# Patient Record
Sex: Female | Born: 1950 | Race: White | Hispanic: No | Smoking: Former smoker
Health system: Southern US, Community
[De-identification: ages and names within clinical notes are randomized; demographics above are authoritative.]

## PROBLEM LIST (undated history)

## (undated) DIAGNOSIS — E119 Type 2 diabetes mellitus without complications: Secondary | ICD-10-CM

## (undated) DIAGNOSIS — G4700 Insomnia, unspecified: Secondary | ICD-10-CM

## (undated) DIAGNOSIS — R1115 Cyclical vomiting syndrome unrelated to migraine: Secondary | ICD-10-CM

## (undated) DIAGNOSIS — J189 Pneumonia, unspecified organism: Secondary | ICD-10-CM

## (undated) DIAGNOSIS — M129 Arthropathy, unspecified: Secondary | ICD-10-CM

## (undated) DIAGNOSIS — Z9889 Other specified postprocedural states: Secondary | ICD-10-CM

## (undated) DIAGNOSIS — M899 Disorder of bone, unspecified: Secondary | ICD-10-CM

## (undated) DIAGNOSIS — F419 Anxiety disorder, unspecified: Secondary | ICD-10-CM

## (undated) DIAGNOSIS — R0609 Other forms of dyspnea: Secondary | ICD-10-CM

## (undated) DIAGNOSIS — F329 Major depressive disorder, single episode, unspecified: Secondary | ICD-10-CM

## (undated) DIAGNOSIS — K219 Gastro-esophageal reflux disease without esophagitis: Secondary | ICD-10-CM

## (undated) DIAGNOSIS — IMO0002 Reserved for concepts with insufficient information to code with codable children: Secondary | ICD-10-CM

## (undated) DIAGNOSIS — I10 Essential (primary) hypertension: Secondary | ICD-10-CM

## (undated) DIAGNOSIS — R040 Epistaxis: Secondary | ICD-10-CM

## (undated) DIAGNOSIS — M949 Disorder of cartilage, unspecified: Secondary | ICD-10-CM

## (undated) DIAGNOSIS — IMO0001 Reserved for inherently not codable concepts without codable children: Secondary | ICD-10-CM

## (undated) DIAGNOSIS — R0989 Other specified symptoms and signs involving the circulatory and respiratory systems: Secondary | ICD-10-CM

## (undated) DIAGNOSIS — R7303 Prediabetes: Secondary | ICD-10-CM

## (undated) DIAGNOSIS — Z8669 Personal history of other diseases of the nervous system and sense organs: Secondary | ICD-10-CM

## (undated) DIAGNOSIS — M79609 Pain in unspecified limb: Secondary | ICD-10-CM

## (undated) DIAGNOSIS — Z853 Personal history of malignant neoplasm of breast: Principal | ICD-10-CM

## (undated) DIAGNOSIS — M25559 Pain in unspecified hip: Secondary | ICD-10-CM

## (undated) DIAGNOSIS — E041 Nontoxic single thyroid nodule: Secondary | ICD-10-CM

## (undated) DIAGNOSIS — B1710 Acute hepatitis C without hepatic coma: Secondary | ICD-10-CM

## (undated) DIAGNOSIS — M1990 Unspecified osteoarthritis, unspecified site: Secondary | ICD-10-CM

## (undated) DIAGNOSIS — D649 Anemia, unspecified: Secondary | ICD-10-CM

## (undated) DIAGNOSIS — C801 Malignant (primary) neoplasm, unspecified: Secondary | ICD-10-CM

## (undated) DIAGNOSIS — Z5189 Encounter for other specified aftercare: Secondary | ICD-10-CM

## (undated) HISTORY — PX: ABDOMINAL HYSTERECTOMY: SHX81

## (undated) HISTORY — DX: Cyclical vomiting syndrome unrelated to migraine: R11.15

## (undated) HISTORY — DX: Malignant (primary) neoplasm, unspecified: C80.1

## (undated) HISTORY — PX: TONSILLECTOMY: SUR1361

## (undated) HISTORY — DX: Reserved for inherently not codable concepts without codable children: IMO0001

## (undated) HISTORY — DX: Arthropathy, unspecified: M12.9

## (undated) HISTORY — DX: Disorder of bone, unspecified: M89.9

## (undated) HISTORY — DX: Personal history of malignant neoplasm of breast: Z85.3

## (undated) HISTORY — DX: Acute hepatitis C without hepatic coma: B17.10

## (undated) HISTORY — DX: Unspecified osteoarthritis, unspecified site: M19.90

## (undated) HISTORY — DX: Other specified symptoms and signs involving the circulatory and respiratory systems: R09.89

## (undated) HISTORY — DX: Pain in unspecified hip: M25.559

## (undated) HISTORY — DX: Major depressive disorder, single episode, unspecified: F32.9

## (undated) HISTORY — DX: Insomnia, unspecified: G47.00

## (undated) HISTORY — DX: Other forms of dyspnea: R06.09

## (undated) HISTORY — DX: Epistaxis: R04.0

## (undated) HISTORY — DX: Gastro-esophageal reflux disease without esophagitis: K21.9

## (undated) HISTORY — DX: Pain in unspecified limb: M79.609

## (undated) HISTORY — DX: Essential (primary) hypertension: I10

## (undated) HISTORY — DX: Reserved for concepts with insufficient information to code with codable children: IMO0002

## (undated) HISTORY — PX: OTHER SURGICAL HISTORY: SHX169

## (undated) HISTORY — DX: Encounter for other specified aftercare: Z51.89

## (undated) HISTORY — DX: Type 2 diabetes mellitus without complications: E11.9

## (undated) HISTORY — PX: TUBAL LIGATION: SHX77

## (undated) HISTORY — DX: Disorder of cartilage, unspecified: M94.9

---

## 1998-06-30 HISTORY — PX: BREAST LUMPECTOMY: SHX2

## 2003-11-29 ENCOUNTER — Emergency Department (HOSPITAL_COMMUNITY): Admission: EM | Admit: 2003-11-29 | Discharge: 2003-11-29 | Payer: Self-pay | Admitting: Family Medicine

## 2004-05-27 ENCOUNTER — Ambulatory Visit: Payer: Self-pay | Admitting: Family Medicine

## 2004-05-27 ENCOUNTER — Other Ambulatory Visit: Admission: RE | Admit: 2004-05-27 | Discharge: 2004-05-27 | Payer: Self-pay | Admitting: Family Medicine

## 2004-06-03 ENCOUNTER — Encounter: Admission: RE | Admit: 2004-06-03 | Discharge: 2004-06-03 | Payer: Self-pay | Admitting: Family Medicine

## 2004-06-03 ENCOUNTER — Ambulatory Visit: Payer: Self-pay | Admitting: Family Medicine

## 2004-06-10 ENCOUNTER — Ambulatory Visit: Payer: Self-pay | Admitting: Oncology

## 2004-08-13 ENCOUNTER — Ambulatory Visit: Payer: Self-pay | Admitting: Oncology

## 2004-10-08 ENCOUNTER — Ambulatory Visit: Payer: Self-pay | Admitting: Family Medicine

## 2004-10-10 ENCOUNTER — Ambulatory Visit: Payer: Self-pay | Admitting: Oncology

## 2004-10-30 ENCOUNTER — Ambulatory Visit: Payer: Self-pay | Admitting: Internal Medicine

## 2004-11-07 ENCOUNTER — Ambulatory Visit: Payer: Self-pay | Admitting: Internal Medicine

## 2005-01-10 ENCOUNTER — Ambulatory Visit: Payer: Self-pay | Admitting: Oncology

## 2005-05-21 ENCOUNTER — Ambulatory Visit: Payer: Self-pay | Admitting: Family Medicine

## 2005-05-21 ENCOUNTER — Other Ambulatory Visit: Admission: RE | Admit: 2005-05-21 | Discharge: 2005-05-21 | Payer: Self-pay | Admitting: Family Medicine

## 2005-05-21 ENCOUNTER — Encounter: Payer: Self-pay | Admitting: Family Medicine

## 2005-06-09 ENCOUNTER — Encounter: Admission: RE | Admit: 2005-06-09 | Discharge: 2005-06-09 | Payer: Self-pay | Admitting: Family Medicine

## 2005-06-18 ENCOUNTER — Encounter: Admission: RE | Admit: 2005-06-18 | Discharge: 2005-06-18 | Payer: Self-pay | Admitting: Family Medicine

## 2005-06-26 ENCOUNTER — Encounter (INDEPENDENT_AMBULATORY_CARE_PROVIDER_SITE_OTHER): Payer: Self-pay | Admitting: Diagnostic Radiology

## 2005-06-26 ENCOUNTER — Encounter (INDEPENDENT_AMBULATORY_CARE_PROVIDER_SITE_OTHER): Payer: Self-pay | Admitting: *Deleted

## 2005-06-26 ENCOUNTER — Encounter: Admission: RE | Admit: 2005-06-26 | Discharge: 2005-06-26 | Payer: Self-pay | Admitting: Family Medicine

## 2005-06-30 HISTORY — PX: MASTECTOMY: SHX3

## 2005-07-22 ENCOUNTER — Ambulatory Visit: Payer: Self-pay | Admitting: Oncology

## 2005-07-24 ENCOUNTER — Ambulatory Visit: Payer: Self-pay | Admitting: Family Medicine

## 2005-09-01 ENCOUNTER — Encounter: Admission: RE | Admit: 2005-09-01 | Discharge: 2005-09-01 | Payer: Self-pay | Admitting: Oncology

## 2005-09-02 ENCOUNTER — Ambulatory Visit: Payer: Self-pay | Admitting: Internal Medicine

## 2005-09-03 ENCOUNTER — Encounter: Admission: RE | Admit: 2005-09-03 | Discharge: 2005-09-03 | Payer: Self-pay | Admitting: Surgery

## 2005-09-04 ENCOUNTER — Ambulatory Visit (HOSPITAL_BASED_OUTPATIENT_CLINIC_OR_DEPARTMENT_OTHER): Admission: RE | Admit: 2005-09-04 | Discharge: 2005-09-05 | Payer: Self-pay | Admitting: Surgery

## 2005-09-04 ENCOUNTER — Encounter (INDEPENDENT_AMBULATORY_CARE_PROVIDER_SITE_OTHER): Payer: Self-pay | Admitting: Specialist

## 2005-09-26 ENCOUNTER — Ambulatory Visit: Payer: Self-pay | Admitting: Oncology

## 2005-10-06 ENCOUNTER — Ambulatory Visit: Payer: Self-pay | Admitting: Family Medicine

## 2005-10-16 ENCOUNTER — Ambulatory Visit (HOSPITAL_BASED_OUTPATIENT_CLINIC_OR_DEPARTMENT_OTHER): Admission: RE | Admit: 2005-10-16 | Discharge: 2005-10-16 | Payer: Self-pay | Admitting: Surgery

## 2005-10-16 LAB — HEPATITIS C RNA QUANTITATIVE
HCV Quantitative Log: 6.73 {Log} — ABNORMAL HIGH (ref <1.70)
HCV Quantitative: 5320000 IU/mL — ABNORMAL HIGH (ref <50)

## 2005-10-24 LAB — CBC WITH DIFFERENTIAL/PLATELET
Basophils Absolute: 0.1 10*3/uL (ref 0.0–0.1)
Eosinophils Absolute: 0 10*3/uL (ref 0.0–0.5)
HCT: 41.4 % (ref 34.8–46.6)
HGB: 14.2 g/dL (ref 11.6–15.9)
LYMPH%: 30.2 % (ref 14.0–48.0)
MCV: 83.1 fL (ref 81.0–101.0)
MONO#: 1.3 10*3/uL — ABNORMAL HIGH (ref 0.1–0.9)
MONO%: 10.9 % (ref 0.0–13.0)
NEUT#: 6.8 10*3/uL — ABNORMAL HIGH (ref 1.5–6.5)
NEUT%: 57.6 % (ref 39.6–76.8)
Platelets: 207 10*3/uL (ref 145–400)
RBC: 4.98 10*6/uL (ref 3.70–5.32)

## 2005-10-31 LAB — CBC WITH DIFFERENTIAL/PLATELET
BASO%: 0.6 % (ref 0.0–2.0)
Eosinophils Absolute: 0 10*3/uL (ref 0.0–0.5)
HCT: 38.3 % (ref 34.8–46.6)
LYMPH%: 23.3 % (ref 14.0–48.0)
MCHC: 35.1 g/dL (ref 32.0–36.0)
MONO#: 0.7 10*3/uL (ref 0.1–0.9)
NEUT%: 70.3 % (ref 39.6–76.8)
Platelets: 153 10*3/uL (ref 145–400)
WBC: 12.3 10*3/uL — ABNORMAL HIGH (ref 3.9–10.0)

## 2005-11-04 LAB — CLOSTRIDIUM DIFFICILE EIA

## 2005-11-07 LAB — CBC WITH DIFFERENTIAL/PLATELET
BASO%: 0.8 % (ref 0.0–2.0)
EOS%: 0.2 % (ref 0.0–7.0)
HCT: 39.4 % (ref 34.8–46.6)
MCH: 28.9 pg (ref 26.0–34.0)
MCHC: 34 g/dL (ref 32.0–36.0)
MONO#: 1.3 10*3/uL — ABNORMAL HIGH (ref 0.1–0.9)
RBC: 4.64 10*6/uL (ref 3.70–5.32)
RDW: 14.7 % — ABNORMAL HIGH (ref 11.3–14.5)
WBC: 9 10*3/uL (ref 3.9–10.0)
lymph#: 2.3 10*3/uL (ref 0.9–3.3)

## 2005-11-07 LAB — STOOL CULTURE

## 2005-11-13 ENCOUNTER — Ambulatory Visit: Payer: Self-pay | Admitting: Oncology

## 2005-11-17 LAB — CBC WITH DIFFERENTIAL/PLATELET
BASO%: 0.4 % (ref 0.0–2.0)
Basophils Absolute: 0.1 10*3/uL (ref 0.0–0.1)
EOS%: 0.1 % (ref 0.0–7.0)
HCT: 36.6 % (ref 34.8–46.6)
HGB: 12.6 g/dL (ref 11.6–15.9)
MONO#: 1.2 10*3/uL — ABNORMAL HIGH (ref 0.1–0.9)
NEUT#: 20 10*3/uL — ABNORMAL HIGH (ref 1.5–6.5)
NEUT%: 79.6 % — ABNORMAL HIGH (ref 39.6–76.8)
RDW: 14.6 % — ABNORMAL HIGH (ref 11.3–14.5)
WBC: 25.1 10*3/uL — ABNORMAL HIGH (ref 3.9–10.0)
lymph#: 3.8 10*3/uL — ABNORMAL HIGH (ref 0.9–3.3)

## 2005-11-28 LAB — CBC WITH DIFFERENTIAL/PLATELET
Eosinophils Absolute: 0 10*3/uL (ref 0.0–0.5)
HGB: 12.5 g/dL (ref 11.6–15.9)
MONO#: 1 10*3/uL — ABNORMAL HIGH (ref 0.1–0.9)
NEUT#: 6.2 10*3/uL (ref 1.5–6.5)
RBC: 4.24 10*6/uL (ref 3.70–5.32)
RDW: 16.4 % — ABNORMAL HIGH (ref 11.3–14.5)
WBC: 9.9 10*3/uL (ref 3.9–10.0)
lymph#: 2.6 10*3/uL (ref 0.9–3.3)

## 2005-12-08 LAB — CBC WITH DIFFERENTIAL/PLATELET
Basophils Absolute: 0.2 10*3/uL — ABNORMAL HIGH (ref 0.0–0.1)
Eosinophils Absolute: 0 10*3/uL (ref 0.0–0.5)
HCT: 34.1 % — ABNORMAL LOW (ref 34.8–46.6)
HGB: 11.3 g/dL — ABNORMAL LOW (ref 11.6–15.9)
LYMPH%: 9.7 % — ABNORMAL LOW (ref 14.0–48.0)
MONO#: 1.9 10*3/uL — ABNORMAL HIGH (ref 0.1–0.9)
NEUT#: 37.8 10*3/uL — ABNORMAL HIGH (ref 1.5–6.5)
NEUT%: 85.5 % — ABNORMAL HIGH (ref 39.6–76.8)
Platelets: 241 10*3/uL (ref 145–400)
RBC: 3.84 10*6/uL (ref 3.70–5.32)
WBC: 44.2 10*3/uL — ABNORMAL HIGH (ref 3.9–10.0)

## 2005-12-15 ENCOUNTER — Ambulatory Visit (HOSPITAL_COMMUNITY): Admission: RE | Admit: 2005-12-15 | Discharge: 2005-12-15 | Payer: Self-pay | Admitting: Oncology

## 2005-12-17 ENCOUNTER — Encounter (INDEPENDENT_AMBULATORY_CARE_PROVIDER_SITE_OTHER): Payer: Self-pay | Admitting: *Deleted

## 2005-12-17 ENCOUNTER — Ambulatory Visit (HOSPITAL_BASED_OUTPATIENT_CLINIC_OR_DEPARTMENT_OTHER): Admission: RE | Admit: 2005-12-17 | Discharge: 2005-12-18 | Payer: Self-pay | Admitting: Surgery

## 2005-12-23 ENCOUNTER — Ambulatory Visit: Payer: Self-pay | Admitting: Oncology

## 2006-01-13 ENCOUNTER — Ambulatory Visit: Payer: Self-pay | Admitting: Gastroenterology

## 2006-01-15 ENCOUNTER — Ambulatory Visit: Payer: Self-pay | Admitting: Family Medicine

## 2006-01-15 ENCOUNTER — Ambulatory Visit (HOSPITAL_COMMUNITY): Admission: RE | Admit: 2006-01-15 | Discharge: 2006-01-15 | Payer: Self-pay | Admitting: Oncology

## 2006-01-16 ENCOUNTER — Ambulatory Visit (HOSPITAL_COMMUNITY): Admission: RE | Admit: 2006-01-16 | Discharge: 2006-01-16 | Payer: Self-pay | Admitting: Oncology

## 2006-01-19 LAB — CBC WITH DIFFERENTIAL/PLATELET
BASO%: 0.3 % (ref 0.0–2.0)
EOS%: 0.2 % (ref 0.0–7.0)
HCT: 37 % (ref 34.8–46.6)
MCH: 30.8 pg (ref 26.0–34.0)
MCHC: 33.3 g/dL (ref 32.0–36.0)
NEUT%: 73.3 % (ref 39.6–76.8)
RDW: 15.8 % — ABNORMAL HIGH (ref 11.3–14.5)
lymph#: 1.3 10*3/uL (ref 0.9–3.3)

## 2006-01-19 LAB — COMPREHENSIVE METABOLIC PANEL
ALT: 52 U/L — ABNORMAL HIGH (ref 0–40)
AST: 50 U/L — ABNORMAL HIGH (ref 0–37)
Alkaline Phosphatase: 74 U/L (ref 39–117)
Calcium: 9.2 mg/dL (ref 8.4–10.5)
Chloride: 104 mEq/L (ref 96–112)
Creatinine, Ser: 0.69 mg/dL (ref 0.40–1.20)

## 2006-01-21 ENCOUNTER — Ambulatory Visit: Payer: Self-pay | Admitting: Family Medicine

## 2006-01-30 ENCOUNTER — Ambulatory Visit: Payer: Self-pay | Admitting: Family Medicine

## 2006-02-12 ENCOUNTER — Ambulatory Visit: Payer: Self-pay | Admitting: Gastroenterology

## 2006-02-12 ENCOUNTER — Ambulatory Visit: Payer: Self-pay | Admitting: Oncology

## 2006-03-20 ENCOUNTER — Ambulatory Visit (HOSPITAL_COMMUNITY): Admission: RE | Admit: 2006-03-20 | Discharge: 2006-03-20 | Payer: Self-pay | Admitting: Gastroenterology

## 2006-03-20 ENCOUNTER — Encounter (INDEPENDENT_AMBULATORY_CARE_PROVIDER_SITE_OTHER): Payer: Self-pay | Admitting: Specialist

## 2006-03-25 ENCOUNTER — Ambulatory Visit (HOSPITAL_COMMUNITY): Admission: RE | Admit: 2006-03-25 | Discharge: 2006-03-25 | Payer: Self-pay | Admitting: Interventional Radiology

## 2006-04-09 ENCOUNTER — Ambulatory Visit: Payer: Self-pay | Admitting: Oncology

## 2006-04-13 ENCOUNTER — Ambulatory Visit: Payer: Self-pay | Admitting: Family Medicine

## 2006-04-16 ENCOUNTER — Ambulatory Visit: Payer: Self-pay | Admitting: Gastroenterology

## 2006-04-16 ENCOUNTER — Encounter: Admission: RE | Admit: 2006-04-16 | Discharge: 2006-04-16 | Payer: Self-pay | Admitting: Family Medicine

## 2006-04-17 ENCOUNTER — Ambulatory Visit (HOSPITAL_COMMUNITY): Admission: RE | Admit: 2006-04-17 | Discharge: 2006-04-17 | Payer: Self-pay | Admitting: Oncology

## 2006-04-30 ENCOUNTER — Ambulatory Visit: Payer: Self-pay | Admitting: Family Medicine

## 2006-04-30 LAB — CONVERTED CEMR LAB
Albumin: 3.6 g/dL (ref 3.5–5.2)
BUN: 19 mg/dL (ref 6–23)
CO2: 24 meq/L (ref 19–32)
Chloride: 105 meq/L (ref 96–112)
Creatinine, Ser: 0.7 mg/dL (ref 0.4–1.2)
Eosinophil percent: 1.1 % (ref 0.0–5.0)
GFR calc non Af Amer: 92 mL/min
HCT: 37.9 % (ref 36.0–46.0)
Hemoglobin: 12.5 g/dL (ref 12.0–15.0)
MCHC: 33 g/dL (ref 30.0–36.0)
MCV: 85 fL (ref 78.0–100.0)
Monocytes Absolute: 0.5 10*3/uL (ref 0.2–0.7)
Neutrophils Relative %: 56.2 % (ref 43.0–77.0)
RDW: 13.9 % (ref 11.5–14.6)
Total Bilirubin: 0.5 mg/dL (ref 0.3–1.2)

## 2006-05-08 ENCOUNTER — Ambulatory Visit: Payer: Self-pay | Admitting: Family Medicine

## 2006-05-28 ENCOUNTER — Ambulatory Visit: Payer: Self-pay | Admitting: Family Medicine

## 2006-07-09 ENCOUNTER — Ambulatory Visit: Payer: Self-pay | Admitting: Oncology

## 2006-07-14 LAB — COMPREHENSIVE METABOLIC PANEL
AST: 30 U/L (ref 0–37)
Albumin: 4.3 g/dL (ref 3.5–5.2)
Alkaline Phosphatase: 69 U/L (ref 39–117)
BUN: 13 mg/dL (ref 6–23)
Calcium: 9.6 mg/dL (ref 8.4–10.5)
Creatinine, Ser: 0.74 mg/dL (ref 0.40–1.20)
Glucose, Bld: 106 mg/dL — ABNORMAL HIGH (ref 70–99)
Potassium: 4.6 mEq/L (ref 3.5–5.3)

## 2006-07-14 LAB — CBC WITH DIFFERENTIAL/PLATELET
Basophils Absolute: 0 10*3/uL (ref 0.0–0.1)
EOS%: 0.6 % (ref 0.0–7.0)
Eosinophils Absolute: 0.1 10*3/uL (ref 0.0–0.5)
HCT: 40.2 % (ref 34.8–46.6)
HGB: 13.3 g/dL (ref 11.6–15.9)
MCH: 28.5 pg (ref 26.0–34.0)
MCV: 86.1 fL (ref 81.0–101.0)
MONO%: 6.7 % (ref 0.0–13.0)
NEUT#: 5.1 10*3/uL (ref 1.5–6.5)
NEUT%: 55.9 % (ref 39.6–76.8)
Platelets: 285 10*3/uL (ref 145–400)

## 2006-08-03 DIAGNOSIS — B1710 Acute hepatitis C without hepatic coma: Secondary | ICD-10-CM

## 2006-08-03 DIAGNOSIS — F3289 Other specified depressive episodes: Secondary | ICD-10-CM

## 2006-08-03 DIAGNOSIS — F329 Major depressive disorder, single episode, unspecified: Secondary | ICD-10-CM

## 2006-08-03 DIAGNOSIS — E119 Type 2 diabetes mellitus without complications: Secondary | ICD-10-CM

## 2006-08-03 DIAGNOSIS — Z853 Personal history of malignant neoplasm of breast: Secondary | ICD-10-CM

## 2006-08-03 HISTORY — DX: Personal history of malignant neoplasm of breast: Z85.3

## 2006-08-03 HISTORY — DX: Acute hepatitis C without hepatic coma: B17.10

## 2006-08-03 HISTORY — DX: Type 2 diabetes mellitus without complications: E11.9

## 2006-08-03 HISTORY — DX: Other specified depressive episodes: F32.89

## 2006-08-03 HISTORY — DX: Major depressive disorder, single episode, unspecified: F32.9

## 2006-09-01 ENCOUNTER — Ambulatory Visit: Payer: Self-pay | Admitting: Oncology

## 2006-11-19 ENCOUNTER — Ambulatory Visit: Payer: Self-pay | Admitting: Oncology

## 2006-11-24 LAB — CBC WITH DIFFERENTIAL/PLATELET
BASO%: 0.4 % (ref 0.0–2.0)
Eosinophils Absolute: 0 10*3/uL (ref 0.0–0.5)
MCHC: 34.3 g/dL (ref 32.0–36.0)
MONO#: 0.7 10*3/uL (ref 0.1–0.9)
NEUT#: 6.4 10*3/uL (ref 1.5–6.5)
RBC: 4.76 10*6/uL (ref 3.70–5.32)
RDW: 14.9 % — ABNORMAL HIGH (ref 11.3–14.5)
WBC: 10.4 10*3/uL — ABNORMAL HIGH (ref 3.9–10.0)
lymph#: 3.3 10*3/uL (ref 0.9–3.3)

## 2006-11-24 LAB — COMPREHENSIVE METABOLIC PANEL
ALT: 31 U/L (ref 0–35)
Albumin: 4.4 g/dL (ref 3.5–5.2)
CO2: 21 mEq/L (ref 19–32)
Chloride: 101 mEq/L (ref 96–112)
Glucose, Bld: 95 mg/dL (ref 70–99)
Potassium: 4.6 mEq/L (ref 3.5–5.3)
Sodium: 133 mEq/L — ABNORMAL LOW (ref 135–145)
Total Bilirubin: 0.4 mg/dL (ref 0.3–1.2)
Total Protein: 7.5 g/dL (ref 6.0–8.3)

## 2006-11-24 LAB — CANCER ANTIGEN 27.29: CA 27.29: 14 U/mL (ref 0–39)

## 2006-11-26 ENCOUNTER — Ambulatory Visit (HOSPITAL_COMMUNITY): Admission: RE | Admit: 2006-11-26 | Discharge: 2006-11-26 | Payer: Self-pay | Admitting: Oncology

## 2007-04-30 ENCOUNTER — Ambulatory Visit: Payer: Self-pay | Admitting: Internal Medicine

## 2007-04-30 DIAGNOSIS — M25559 Pain in unspecified hip: Secondary | ICD-10-CM

## 2007-04-30 HISTORY — DX: Pain in unspecified hip: M25.559

## 2007-05-07 ENCOUNTER — Ambulatory Visit: Payer: Self-pay | Admitting: Internal Medicine

## 2007-05-18 ENCOUNTER — Telehealth: Payer: Self-pay | Admitting: Internal Medicine

## 2007-05-21 ENCOUNTER — Ambulatory Visit: Payer: Self-pay | Admitting: Oncology

## 2007-05-25 LAB — AFP TUMOR MARKER: AFP-Tumor Marker: 19.1 ng/mL — ABNORMAL HIGH (ref 0.0–8.0)

## 2007-05-25 LAB — COMPREHENSIVE METABOLIC PANEL
AST: 31 U/L (ref 0–37)
BUN: 21 mg/dL (ref 6–23)
Calcium: 9.2 mg/dL (ref 8.4–10.5)
Chloride: 106 mEq/L (ref 96–112)
Creatinine, Ser: 0.9 mg/dL (ref 0.40–1.20)
Total Bilirubin: 0.5 mg/dL (ref 0.3–1.2)

## 2007-05-31 ENCOUNTER — Ambulatory Visit: Payer: Self-pay | Admitting: Internal Medicine

## 2007-06-01 ENCOUNTER — Encounter: Payer: Self-pay | Admitting: Family Medicine

## 2007-06-01 ENCOUNTER — Telehealth: Payer: Self-pay | Admitting: Internal Medicine

## 2007-06-01 LAB — CBC WITH DIFFERENTIAL/PLATELET
Basophils Absolute: 0 10*3/uL (ref 0.0–0.1)
HCT: 37.1 % (ref 34.8–46.6)
HGB: 12.5 g/dL (ref 11.6–15.9)
LYMPH%: 29.5 % (ref 14.0–48.0)
MONO#: 0.6 10*3/uL (ref 0.1–0.9)
NEUT%: 60.9 % (ref 39.6–76.8)
Platelets: 235 10*3/uL (ref 145–400)
WBC: 8.2 10*3/uL (ref 3.9–10.0)
lymph#: 2.4 10*3/uL (ref 0.9–3.3)

## 2007-06-02 ENCOUNTER — Ambulatory Visit (HOSPITAL_COMMUNITY): Admission: RE | Admit: 2007-06-02 | Discharge: 2007-06-02 | Payer: Self-pay | Admitting: Oncology

## 2007-06-15 ENCOUNTER — Encounter: Payer: Self-pay | Admitting: Internal Medicine

## 2007-07-26 DIAGNOSIS — M949 Disorder of cartilage, unspecified: Secondary | ICD-10-CM

## 2007-07-26 DIAGNOSIS — M899 Disorder of bone, unspecified: Secondary | ICD-10-CM

## 2007-07-26 HISTORY — DX: Disorder of bone, unspecified: M89.9

## 2007-10-06 ENCOUNTER — Telehealth (INDEPENDENT_AMBULATORY_CARE_PROVIDER_SITE_OTHER): Payer: Self-pay | Admitting: *Deleted

## 2007-10-25 ENCOUNTER — Ambulatory Visit: Payer: Self-pay | Admitting: Oncology

## 2007-10-25 ENCOUNTER — Ambulatory Visit: Payer: Self-pay | Admitting: Internal Medicine

## 2007-10-25 DIAGNOSIS — M1611 Unilateral primary osteoarthritis, right hip: Secondary | ICD-10-CM

## 2007-10-25 DIAGNOSIS — M129 Arthropathy, unspecified: Secondary | ICD-10-CM

## 2007-10-25 HISTORY — DX: Arthropathy, unspecified: M12.9

## 2007-10-27 LAB — COMPREHENSIVE METABOLIC PANEL
ALT: 25 U/L (ref 0–35)
Alkaline Phosphatase: 71 U/L (ref 39–117)
Sodium: 140 mEq/L (ref 135–145)
Total Bilirubin: 0.4 mg/dL (ref 0.3–1.2)
Total Protein: 7 g/dL (ref 6.0–8.3)

## 2007-10-27 LAB — CBC WITH DIFFERENTIAL/PLATELET
BASO%: 0.7 % (ref 0.0–2.0)
LYMPH%: 28.4 % (ref 14.0–48.0)
MCHC: 33.5 g/dL (ref 32.0–36.0)
MCV: 78.9 fL — ABNORMAL LOW (ref 81.0–101.0)
MONO%: 5.6 % (ref 0.0–13.0)
Platelets: 238 10*3/uL (ref 145–400)
RBC: 4.62 10*6/uL (ref 3.70–5.32)
WBC: 7.5 10*3/uL (ref 3.9–10.0)

## 2007-10-27 LAB — AFP TUMOR MARKER: AFP-Tumor Marker: 14.3 ng/mL — ABNORMAL HIGH (ref 0.0–8.0)

## 2007-10-27 LAB — LACTATE DEHYDROGENASE: LDH: 146 U/L (ref 94–250)

## 2007-11-02 ENCOUNTER — Encounter: Payer: Self-pay | Admitting: Internal Medicine

## 2007-11-02 ENCOUNTER — Ambulatory Visit: Payer: Self-pay | Admitting: Internal Medicine

## 2008-01-13 ENCOUNTER — Ambulatory Visit: Payer: Self-pay | Admitting: Internal Medicine

## 2008-01-13 DIAGNOSIS — M79609 Pain in unspecified limb: Secondary | ICD-10-CM

## 2008-01-13 DIAGNOSIS — IMO0001 Reserved for inherently not codable concepts without codable children: Secondary | ICD-10-CM

## 2008-01-13 HISTORY — DX: Reserved for inherently not codable concepts without codable children: IMO0001

## 2008-01-13 HISTORY — DX: Pain in unspecified limb: M79.609

## 2008-01-27 ENCOUNTER — Ambulatory Visit: Payer: Self-pay | Admitting: Internal Medicine

## 2008-01-27 DIAGNOSIS — IMO0002 Reserved for concepts with insufficient information to code with codable children: Secondary | ICD-10-CM

## 2008-01-27 DIAGNOSIS — R040 Epistaxis: Secondary | ICD-10-CM

## 2008-01-27 HISTORY — DX: Reserved for concepts with insufficient information to code with codable children: IMO0002

## 2008-01-27 HISTORY — DX: Epistaxis: R04.0

## 2008-01-31 ENCOUNTER — Encounter: Payer: Self-pay | Admitting: Internal Medicine

## 2008-02-21 ENCOUNTER — Encounter: Payer: Self-pay | Admitting: Internal Medicine

## 2008-04-21 ENCOUNTER — Ambulatory Visit: Payer: Self-pay | Admitting: Internal Medicine

## 2008-04-21 LAB — CONVERTED CEMR LAB
CO2: 25 meq/L (ref 19–32)
Chloride: 108 meq/L (ref 96–112)
GFR calc non Af Amer: 68 mL/min
Hgb A1c MFr Bld: 6.1 % — ABNORMAL HIGH (ref 4.6–6.0)
LDL Cholesterol: 80 mg/dL (ref 0–99)
Potassium: 4.6 meq/L (ref 3.5–5.1)
Sodium: 139 meq/L (ref 135–145)
Total CHOL/HDL Ratio: 3.3
Triglycerides: 145 mg/dL (ref 0–149)

## 2008-04-24 ENCOUNTER — Ambulatory Visit: Payer: Self-pay | Admitting: Internal Medicine

## 2008-04-25 ENCOUNTER — Telehealth: Payer: Self-pay | Admitting: Internal Medicine

## 2008-04-25 ENCOUNTER — Telehealth (INDEPENDENT_AMBULATORY_CARE_PROVIDER_SITE_OTHER): Payer: Self-pay | Admitting: *Deleted

## 2008-04-30 DIAGNOSIS — K219 Gastro-esophageal reflux disease without esophagitis: Secondary | ICD-10-CM

## 2008-04-30 DIAGNOSIS — I10 Essential (primary) hypertension: Secondary | ICD-10-CM

## 2008-04-30 HISTORY — DX: Essential (primary) hypertension: I10

## 2008-04-30 HISTORY — DX: Gastro-esophageal reflux disease without esophagitis: K21.9

## 2008-06-08 ENCOUNTER — Ambulatory Visit: Payer: Self-pay | Admitting: Oncology

## 2008-06-12 LAB — COMPREHENSIVE METABOLIC PANEL
AST: 32 U/L (ref 0–37)
Albumin: 4.5 g/dL (ref 3.5–5.2)
Alkaline Phosphatase: 73 U/L (ref 39–117)
Chloride: 101 mEq/L (ref 96–112)
Glucose, Bld: 95 mg/dL (ref 70–99)
Potassium: 4.7 mEq/L (ref 3.5–5.3)
Sodium: 136 mEq/L (ref 135–145)
Total Protein: 7.6 g/dL (ref 6.0–8.3)

## 2008-06-12 LAB — CBC WITH DIFFERENTIAL/PLATELET
Eosinophils Absolute: 0.1 10*3/uL (ref 0.0–0.5)
MCV: 84.2 fL (ref 81.0–101.0)
MONO%: 6 % (ref 0.0–13.0)
NEUT#: 7.5 10*3/uL — ABNORMAL HIGH (ref 1.5–6.5)
RBC: 4.59 10*6/uL (ref 3.70–5.32)
RDW: 14.6 % — ABNORMAL HIGH (ref 11.3–14.5)
WBC: 12 10*3/uL — ABNORMAL HIGH (ref 3.9–10.0)
lymph#: 3.6 10*3/uL — ABNORMAL HIGH (ref 0.9–3.3)

## 2008-06-19 ENCOUNTER — Encounter: Payer: Self-pay | Admitting: Internal Medicine

## 2008-06-19 ENCOUNTER — Ambulatory Visit: Payer: Self-pay | Admitting: Genetic Counselor

## 2008-08-17 ENCOUNTER — Ambulatory Visit: Payer: Self-pay | Admitting: Internal Medicine

## 2008-08-17 DIAGNOSIS — R1115 Cyclical vomiting syndrome unrelated to migraine: Secondary | ICD-10-CM

## 2008-08-17 HISTORY — DX: Cyclical vomiting syndrome unrelated to migraine: R11.15

## 2008-09-29 ENCOUNTER — Ambulatory Visit: Payer: Self-pay | Admitting: Genetic Counselor

## 2008-10-16 ENCOUNTER — Ambulatory Visit: Payer: Self-pay | Admitting: Internal Medicine

## 2008-10-18 ENCOUNTER — Encounter: Payer: Self-pay | Admitting: Internal Medicine

## 2008-10-18 LAB — CONVERTED CEMR LAB
AST: 39 units/L — ABNORMAL HIGH (ref 0–37)
BUN: 14 mg/dL (ref 6–23)
Basophils Absolute: 0.1 10*3/uL (ref 0.0–0.1)
Bilirubin Urine: NEGATIVE
Bilirubin, Direct: 0.1 mg/dL (ref 0.0–0.3)
Cholesterol: 151 mg/dL (ref 0–200)
Creatinine, Ser: 1 mg/dL (ref 0.4–1.2)
Creatinine,U: 40.1 mg/dL
GFR calc non Af Amer: 60.39 mL/min (ref >60)
Glucose, Bld: 99 mg/dL (ref 70–99)
HCT: 36.4 % (ref 36.0–46.0)
HDL: 53.9 mg/dL (ref >39.00)
Hgb A1c MFr Bld: 5.9 % (ref 4.6–6.5)
Ketones, ur: NEGATIVE mg/dL
Leukocytes, UA: NEGATIVE
Lymphs Abs: 2.9 10*3/uL (ref 0.7–4.0)
Microalb Creat Ratio: 5 mg/g (ref 0.0–30.0)
Monocytes Absolute: 0.7 10*3/uL (ref 0.1–1.0)
Monocytes Relative: 7.4 % (ref 3.0–12.0)
Neutrophils Relative %: 61.5 % (ref 43.0–77.0)
Platelets: 227 10*3/uL (ref 150.0–400.0)
Potassium: 4.6 meq/L (ref 3.5–5.1)
RDW: 13.4 % (ref 11.5–14.6)
TSH: 3.26 microintl units/mL (ref 0.35–5.50)
Total Bilirubin: 0.3 mg/dL (ref 0.3–1.2)
Triglycerides: 226 mg/dL — ABNORMAL HIGH (ref 0.0–149.0)
Urobilinogen, UA: 0.2 (ref 0.0–1.0)
VLDL: 45.2 mg/dL — ABNORMAL HIGH (ref 0.0–40.0)
WBC: 9.8 10*3/uL (ref 4.5–10.5)

## 2008-12-07 ENCOUNTER — Ambulatory Visit: Payer: Self-pay | Admitting: Genetic Counselor

## 2009-11-21 ENCOUNTER — Ambulatory Visit: Payer: Self-pay | Admitting: Internal Medicine

## 2009-11-21 DIAGNOSIS — G4700 Insomnia, unspecified: Secondary | ICD-10-CM

## 2009-11-21 DIAGNOSIS — F5104 Psychophysiologic insomnia: Secondary | ICD-10-CM

## 2009-11-21 DIAGNOSIS — R0989 Other specified symptoms and signs involving the circulatory and respiratory systems: Secondary | ICD-10-CM

## 2009-11-21 DIAGNOSIS — R0609 Other forms of dyspnea: Secondary | ICD-10-CM

## 2009-11-21 HISTORY — DX: Insomnia, unspecified: G47.00

## 2009-11-21 HISTORY — DX: Other forms of dyspnea: R06.09

## 2009-11-21 HISTORY — DX: Other specified symptoms and signs involving the circulatory and respiratory systems: R09.89

## 2009-11-22 LAB — CONVERTED CEMR LAB: Vit D, 25-Hydroxy: 39 ng/mL (ref 30–89)

## 2009-12-10 ENCOUNTER — Ambulatory Visit: Payer: Self-pay | Admitting: Internal Medicine

## 2010-07-21 ENCOUNTER — Encounter: Payer: Self-pay | Admitting: Oncology

## 2010-07-21 ENCOUNTER — Encounter: Payer: Self-pay | Admitting: Family Medicine

## 2010-07-28 LAB — CONVERTED CEMR LAB
Albumin: 3.7 g/dL (ref 3.5–5.2)
Alkaline Phosphatase: 76 units/L (ref 39–117)
Alkaline Phosphatase: 80 units/L (ref 39–117)
BUN: 6 mg/dL (ref 6–23)
Basophils Relative: 0.4 % (ref 0.0–3.0)
Bilirubin Urine: NEGATIVE
Bilirubin Urine: NEGATIVE
Bilirubin, Direct: 0.1 mg/dL (ref 0.0–0.3)
Calcium: 9.1 mg/dL (ref 8.4–10.5)
Calcium: 9.3 mg/dL (ref 8.4–10.5)
Cholesterol: 149 mg/dL (ref 0–200)
Creatinine, Ser: 0.7 mg/dL (ref 0.4–1.2)
Creatinine,U: 202.6 mg/dL
Crystals: NEGATIVE
Eosinophils Absolute: 0 10*3/uL (ref 0.0–0.7)
Eosinophils Absolute: 0 10*3/uL (ref 0.0–0.7)
Eosinophils Relative: 0.6 % (ref 0.0–5.0)
GFR calc Af Amer: 111 mL/min
GFR calc non Af Amer: 85.17 mL/min (ref >60)
GFR calc non Af Amer: 92 mL/min
Glucose, Bld: 110 mg/dL — ABNORMAL HIGH (ref 70–99)
HCT: 36.7 % (ref 36.0–46.0)
HDL: 54 mg/dL (ref >39.0)
HDL: 61.3 mg/dL (ref >39.00)
Hemoglobin, Urine: NEGATIVE
Hemoglobin: 11.9 g/dL — ABNORMAL LOW (ref 12.0–15.0)
Hgb A1c MFr Bld: 5.9 % (ref 4.6–6.5)
Hgb A1c MFr Bld: 6.1 % — ABNORMAL HIGH (ref 4.6–6.0)
Ketones, ur: NEGATIVE mg/dL
Ketones, ur: NEGATIVE mg/dL
Leukocytes, UA: NEGATIVE
Lymphocytes Relative: 31.3 % (ref 12.0–46.0)
MCHC: 33.3 g/dL (ref 30.0–36.0)
MCV: 80.2 fL (ref 78.0–100.0)
Microalb Creat Ratio: 0.6 mg/g (ref 0.0–30.0)
Microalb Creat Ratio: 33.9 mg/g — ABNORMAL HIGH (ref 0.0–30.0)
Microalb, Ur: 1.2 mg/dL (ref 0.0–1.9)
Microalb, Ur: 3 mg/dL — ABNORMAL HIGH (ref 0.0–1.9)
Monocytes Absolute: 0.5 10*3/uL (ref 0.1–1.0)
Neutro Abs: 4.6 10*3/uL (ref 1.4–7.7)
Neutrophils Relative %: 60.6 % (ref 43.0–77.0)
Platelets: 251 10*3/uL (ref 150–400)
Potassium: 4.3 meq/L (ref 3.5–5.1)
RBC / HPF: NONE SEEN
RBC: 4.67 M/uL (ref 3.87–5.11)
RDW: 14.5 % (ref 11.5–14.6)
Sodium: 144 meq/L (ref 135–145)
Specific Gravity, Urine: >=1.03 (ref 1.000–1.030)
TSH: 2.11 microintl units/mL (ref 0.35–5.50)
Total CHOL/HDL Ratio: 2
Total Protein, Urine: NEGATIVE mg/dL
Total Protein: 7.1 g/dL (ref 6.0–8.3)
Total Protein: 7.3 g/dL (ref 6.0–8.3)
Triglycerides: 109 mg/dL (ref 0–149)
Urine Glucose: NEGATIVE mg/dL
Urobilinogen, UA: 0.2 (ref 0.0–1.0)
VLDL: 19 mg/dL (ref 0.0–40.0)
VLDL: 22 mg/dL (ref 0–40)
WBC: 8.5 10*3/uL (ref 4.5–10.5)

## 2010-07-30 NOTE — Miscellaneous (Signed)
Summary: Orders Update  Clinical Lists Changes  Orders: Added new Service order of No Show NS50 (NS50) - Signed 

## 2010-07-30 NOTE — Assessment & Plan Note (Signed)
Summary: PER ROBIN SCHED---STC   Vital Signs:  Patient profile:   60 year old female Height:      62 inches Weight:      203 pounds BMI:     37.26 O2 Sat:      94 % on Room air Temp:     98 degrees F oral Pulse rate:   92 / minute BP sitting:   124 / 80  (left arm) Cuff size:   large  Vitals Entered ByZella Ball Ewing (Nov 21, 2009 3:56 PM)  O2 Flow:  Room air  CC: Medication Refills/RE   CC:  Medication Refills/RE.  History of Present Illness: here for refills,  c/o more stress in the past year with divorce and foreclosing on the house; has mild but significant and reproducible DOE with going up stairs in her house , has long hx of smoking and wonders if she has more problem than deconditiong such as COPD;  Pt denies CP,  wheezing, orthopnea, pnd, worsening LE edema, palps, dizziness or syncope  Pt denies new neuro symptoms such as headache, facial or extremity weakness Pt denies polydipsia, polyuria, or low sugar symptoms such as shakiness improved with eating.  Overall good compliance with meds, trying to follow low chol, DM diet, wt stable, little excercise however  Savella helps but is expensive at the 50 two times a day dosing.    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    Problems Prior to Update: 1)  Dyspnea On Exertion  (ICD-786.09) 2)  Persistent Vomiting  (ICD-536.2) 3)  Gerd  (ICD-530.81) 4)  Hypertension  (ICD-401.9) 5)  Epistaxis, Recurrent  (ICD-784.7) 6)  Knee Sprain, Acute  (ICD-844.9) 7)  Fibromyalgia  (ICD-729.1) 8)  Leg Pain, Left  (ICD-729.5) 9)  Preventive Health Care  (ICD-V70.0) 10)  Arthritis  (ICD-716.90) 11)  Osteopenia  (ICD-733.90) 12)  Hip Pain, Right  (ICD-719.45) 13)  Hepatitis C  (ICD-070.51) 14)  Diabetes Mellitus, Type II  (ICD-250.00) 15)  Depression  (ICD-311) 16)  Breast Cancer, Hx of  (ICD-V10.3)  Medications Prior to Update: 1)   Budeprion Xl 300 Mg  Tb24 (Bupropion Hcl) .Marland Kitchen.. 1 in The Am 2)  Fluoxetine Hcl 20 Mg  Caps (Fluoxetine Hcl) .... 3 Cap Daily 3)  Cholestyramine Light 4 Gm/dose  Powd (Cholestyramine Light) .... 2 Scoopful Once Daily 4)  Fosamax 70 Mg Tabs (Alendronate Sodium) .... Take One Tablet Weekly 5)  Femara 2.5 Mg  Tabs (Letrozole) .Marland Kitchen.. 1 By Mouth Qd 6)  Nexium 40 Mg Cpdr (Esomeprazole Magnesium) .Marland Kitchen.. 1po Once Daily 7)  Vicodin 5-500 Mg  Tabs (Hydrocodone-Acetaminophen) .Marland Kitchen.. 1 or By Mouth Qid As Needed Pain 8)  Flexeril 10 Mg  Tabs (Cyclobenzaprine Hcl) .Marland Kitchen.. 1 By Mouth Two Times A Day As Needed For Pain 9)  Onetouch Ultra Test   Strp (Glucose Blood) .... Use Asd Bid 10)  Lancets   Misc (Lancets) .... Use Asd Bid 11)  Diclofenac Sodium 75 Mg  Tbec (Diclofenac Sodium) .Marland Kitchen.. 1 By Mouth Two Times A Day As Needed 12)  Savella 50 Mg  Tabs (Milnacipran Hcl) .Marland Kitchen.. 1 By Mouth Two Times A Day 13)  Lisinopril 5 Mg Tabs (Lisinopril) .Marland Kitchen.. 1 By Mouth Once Daily 14)  Xyzal 5 Mg Tabs (Levocetirizine Dihydrochloride) .Marland Kitchen.. 1po Once Daily As Needed  Current Medications (verified): 1)  Budeprion Xl 300 Mg  Tb24 (Bupropion Hcl) .Marland Kitchen.. 1 in The Am 2)  Fluoxetine Hcl 20 Mg  Caps (Fluoxetine Hcl) .... 3 Cap Daily 3)  Cholestyramine Light 4 Gm/dose  Powd (Cholestyramine Light) .... 2 Scoopful Once Daily 4)  Alendronate Sodium 10 Mg Tabs (Alendronate Sodium) .Marland Kitchen.. 1po Once Daily 5)  Omeprazole 20 Mg Cpdr (Omeprazole) .... 2 By Mouth Once Daily 6)  Vicodin 5-500 Mg  Tabs (Hydrocodone-Acetaminophen) .Marland Kitchen.. 1 or By Mouth Qid As Needed Recurrent Back Pain 7)  Flexeril 10 Mg  Tabs (Cyclobenzaprine Hcl) .Marland Kitchen.. 1 By Mouth Two Times A Day As Needed For Pain - Generic 8)  Onetouch Ultra Test   Strp (Glucose Blood) .... Use Asd 1 Once Daily 9)  Lancets   Misc (Lancets) .... Use Asd Once Daily 10)  Diclofenac Sodium 75 Mg  Tbec (Diclofenac Sodium) .Marland Kitchen.. 1 By Mouth Two Times A Day As Needed 11)  Savella 100 Mg Tabs (Milnacipran Hcl) .... 1/2 Po Two  Times A Day 12)  Losartan Potassium 25 Mg Tabs (Losartan Potassium) .Marland Kitchen.. 1po Once Daily 13)  Aspir-Low 81 Mg Tbec (Aspirin) .Marland Kitchen.. 1 By Mouth Once Daily 14)  Trazodone Hcl 50 Mg Tabs (Trazodone Hcl) .Marland Kitchen.. 1po At Bedtime As Needed  Allergies (verified): 1)  * Luvox 2)  * Effexor  Past History:  Past Medical History: Last updated: 04/24/2008 Breast cancer, hx of, 2000, stage 2b Depression, chronic persistent Diabetes mellitus, type II Hepatitis C - decined treatment Breast cancer, hx of (2007) - recurrence right hip DJD recurrent LBP Hypertension GERD  Past Surgical History: Last updated: 10/25/2007 Lumpectomy (2000) R with axillary exploration B mastectomy (2007) Tonsillectomy s/p right broken leg with surgury as teen Tubal ligation s/p thyroid FNA - neg  Family History: Last updated: 04/24/2008 heart disease - mother No cancer sister with FMS, [place], breast cancer  Social History: Last updated: 11/21/2009 no kids work - med Programmer, multimedia -  Former Smoker - smoked 2-4 ppd but quit 2007 Alcohol use-yes - rare Divorced Drug use-no  Risk Factors: Smoking Status: quit (10/25/2007)  Social History: Reviewed history from 10/25/2007 and no changes required. no kids work - med Programmer, multimedia -  Former Smoker - smoked 2-4 ppd but quit 2007 Alcohol use-yes - rare Divorced Drug use-no Drug Use:  no  Review of Systems  The patient denies anorexia, fever, vision loss, decreased hearing, hoarseness, chest pain, syncope, dyspnea on exertion, peripheral edema, prolonged cough, headaches, hemoptysis, abdominal pain, melena, hematochezia, severe indigestion/heartburn, hematuria, muscle weakness, suspicious skin lesions, difficulty walking, depression, unusual weight change, abnormal bleeding, enlarged lymph nodes, angioedema, and breast masses.         all otherwise negative per pt -    Physical Exam  General:  alert and overweight-appearing.   Head:  normocephalic and  atraumatic.   Eyes:  vision grossly intact, pupils equal, and pupils round.   Ears:  R ear normal and L ear normal.   Nose:  no external deformity and no nasal discharge.   Mouth:  no gingival abnormalities and pharynx pink and moist.   Neck:  supple and no masses.   Lungs:  normal respiratory effort and normal breath sounds.   Heart:  normal rate and regular rhythm.   Abdomen:  soft, non-tender, and normal bowel sounds.   Msk:  no joint tenderness and no joint swelling.   Extremities:  no edema, no erythema  Neurologic:  cranial nerves II-XII intact and strength normal in all extremities.   Skin:  color normal  and no rashes.   Psych:  normally interactive and not anxious appearing.     Impression & Recommendations:  Problem # 1:  Preventive Health Care (ICD-V70.0)  Overall doing well, age appropriate education and counseling updated and referral for appropriate preventive services done unless declined, immunizations up to date or declined, diet counseling done if overweight, urged to quit smoking if smokes , most recent labs reviewed and current ordered if appropriate, ecg reviewed or declined (interpretation per ECG scanned in the EMR if done); information regarding Medicare Prevention requirements given if appropriate; speciality referrals updated as appropriate   Orders: EKG w/ Interpretation (93000) T-Vitamin D (25-Hydroxy) (19147-82956) TLB-BMP (Basic Metabolic Panel-BMET) (80048-METABOL) TLB-CBC Platelet - w/Differential (85025-CBCD) TLB-Hepatic/Liver Function Pnl (80076-HEPATIC) TLB-Lipid Panel (80061-LIPID) TLB-TSH (Thyroid Stimulating Hormone) (84443-TSH) TLB-Udip ONLY (81003-UDIP)  Problem # 2:  DIABETES MELLITUS, TYPE II (ICD-250.00)  Her updated medication list for this problem includes:    Losartan Potassium 25 Mg Tabs (Losartan potassium) .Marland Kitchen... 1po once daily    Aspir-low 81 Mg Tbec (Aspirin) .Marland Kitchen... 1 by mouth once daily cont diet - for a1c; Pt to cont DM diet,  excercise, wt loss efforts; to check labs today., stable overall by hx and exam, ok to continue meds/tx as is   Orders: TLB-A1C / Hgb A1C (Glycohemoglobin) (83036-A1C) TLB-Microalbumin/Creat Ratio, Urine (82043-MALB)  Problem # 3:  DYSPNEA ON EXERTION (ICD-786.09) to check cxr today, also PFT's with signifcant hx of smoking  Orders: T-2 View CXR, Same Day (71020.5TC) Misc. Referral (Misc. Ref)  Problem # 4:  INSOMNIA-SLEEP DISORDER-UNSPEC (ICD-780.52) ok for trazodone at bedtime as needed   Complete Medication List: 1)  Budeprion Xl 300 Mg Tb24 (Bupropion hcl) .Marland Kitchen.. 1 in the am 2)  Fluoxetine Hcl 20 Mg Caps (Fluoxetine hcl) .... 3 cap daily 3)  Cholestyramine Light 4 Gm/dose Powd (Cholestyramine light) .... 2 scoopful once daily 4)  Alendronate Sodium 10 Mg Tabs (Alendronate sodium) .Marland Kitchen.. 1po once daily 5)  Omeprazole 20 Mg Cpdr (Omeprazole) .... 2 by mouth once daily 6)  Vicodin 5-500 Mg Tabs (Hydrocodone-acetaminophen) .Marland Kitchen.. 1 or by mouth qid as needed recurrent back pain 7)  Flexeril 10 Mg Tabs (Cyclobenzaprine hcl) .Marland Kitchen.. 1 by mouth two times a day as needed for pain - generic 8)  Onetouch Ultra Test Strp (Glucose blood) .... Use asd 1 once daily 9)  Lancets Misc (Lancets) .... Use asd once daily 10)  Diclofenac Sodium 75 Mg Tbec (Diclofenac sodium) .Marland Kitchen.. 1 by mouth two times a day as needed 11)  Savella 100 Mg Tabs (Milnacipran hcl) .... 1/2 po two times a day 12)  Losartan Potassium 25 Mg Tabs (Losartan potassium) .Marland Kitchen.. 1po once daily 13)  Aspir-low 81 Mg Tbec (Aspirin) .Marland Kitchen.. 1 by mouth once daily 14)  Trazodone Hcl 50 Mg Tabs (Trazodone hcl) .Marland Kitchen.. 1po at bedtime as needed  Patient Instructions: 1)  Take an Aspirin every day - 81 mg - 1 per day - COATED only 2)  stop the nexium  3)  start the omeprazole 20 mg - 2 per day 4)  remember take only HALF of savella prescription 5)  start the trazodone 50 mg for sleep as needed 6)  Please go to the Lab in the basement for your blood and/or  urine tests today 7)  Your EKG was good today 8)  Please go to Radiology in the basement level for your X-Ray today  9)  You will be contacted about the referral(s) to: Lung testing (PFT's) 10)  please remember  your yearly pap smear and mammogram will need to be scheduled 11)  Please schedule a follow-up appointment in 1 year or sooner if needed Prescriptions: TRAZODONE HCL 50 MG TABS (TRAZODONE HCL) 1po at bedtime as needed  #90 x 3   Entered and Authorized by:   Corwin Levins MD   Signed by:   Corwin Levins MD on 11/21/2009   Method used:   Print then Give to Patient   RxID:   [id number] TRAZODONE HCL 50 MG TABS (TRAZODONE HCL) 1po at bedtime as needed  #30 x 11   Entered and Authorized by:   Corwin Levins MD   Signed by:   Corwin Levins MD on 11/21/2009   Method used:   Print then Give to Patient   RxID:   [id number] NUUVOZDG POTASSIUM 25 MG TABS (LOSARTAN POTASSIUM) 1po once daily  #90 x 3   Entered and Authorized by:   Corwin Levins MD   Signed by:   Corwin Levins MD on 11/21/2009   Method used:   Print then Give to Patient   RxID:   [id number] FIEPPIRJ POTASSIUM 25 MG TABS (LOSARTAN POTASSIUM) 1po once daily  #30 x 11   Entered and Authorized by:   Corwin Levins MD   Signed by:   Corwin Levins MD on 11/21/2009   Method used:   Print then Give to Patient   RxID:   [id number] DICLOFENAC SODIUM 75 MG  TBEC (DICLOFENAC SODIUM) 1 by mouth two times a day as needed  #180 x 3   Entered and Authorized by:   Corwin Levins MD   Signed by:   Corwin Levins MD on 11/21/2009   Method used:   Print then Give to Patient   RxID:   [id number] DICLOFENAC SODIUM 75 MG  TBEC (DICLOFENAC SODIUM) 1 by mouth two times a day as needed  #60 x 11   Entered and Authorized by:   Corwin Levins MD   Signed by:   Corwin Levins MD on 11/21/2009   Method used:   Print then Give to Patient   RxID:   [id number] LANCETS   MISC (LANCETS) use asd once daily  #100 x 11   Entered and  Authorized by:   Corwin Levins MD   Signed by:   Corwin Levins MD on 11/21/2009   Method used:   Print then Give to Patient   RxID:   [id number] ONETOUCH ULTRA TEST   STRP (GLUCOSE BLOOD) use asd 1 once daily  #100 x 11   Entered and Authorized by:   Corwin Levins MD   Signed by:   Corwin Levins MD on 11/21/2009   Method used:   Print then Give to Patient   RxID:   [id number] FLEXERIL 10 MG  TABS (CYCLOBENZAPRINE HCL) 1 by mouth two times a day as needed for pain - generic  #90 x 1   Entered and Authorized by:   Corwin Levins MD   Signed by:   Corwin Levins MD on 11/21/2009   Method used:   Print then Give to Patient   RxID:   [id number] VICODIN 5-500 MG  TABS (HYDROCODONE-ACETAMINOPHEN) 1 or by mouth qid as needed recurrent back pain  #60 x 1   Entered and Authorized by:   Corwin Levins MD   Signed by:   Corwin Levins MD on 11/21/2009   Method used:  Print then Give to Patient   RxID:   [id number] OMEPRAZOLE 20 MG CPDR (OMEPRAZOLE) 2 by mouth once daily  #180 x 3   Entered and Authorized by:   Corwin Levins MD   Signed by:   Corwin Levins MD on 11/21/2009   Method used:   Print then Give to Patient   RxID:   [id number] OMEPRAZOLE 20 MG CPDR (OMEPRAZOLE) 2 by mouth once daily  #60 x 11   Entered and Authorized by:   Corwin Levins MD   Signed by:   Corwin Levins MD on 11/21/2009   Method used:   Print then Give to Patient   RxID:   [id number] ALENDRONATE SODIUM 10 MG TABS (ALENDRONATE SODIUM) 1po once daily  #90 x 3   Entered and Authorized by:   Corwin Levins MD   Signed by:   Corwin Levins MD on 11/21/2009   Method used:   Print then Give to Patient   RxID:   [id number] ALENDRONATE SODIUM 10 MG TABS (ALENDRONATE SODIUM) 1po once daily  #30 x 11   Entered and Authorized by:   Corwin Levins MD   Signed by:   Corwin Levins MD on 11/21/2009   Method used:   Print then Give to Patient   RxID:   [id number] FLUOXETINE HCL 20 MG  CAPS (FLUOXETINE HCL)  3 cap daily  #270 x 3   Entered and Authorized by:   Corwin Levins MD   Signed by:   Corwin Levins MD on 11/21/2009   Method used:   Print then Give to Patient   RxID:   [id number] FLUOXETINE HCL 20 MG  CAPS (FLUOXETINE HCL) 3 cap daily  #90 x 11   Entered and Authorized by:   Corwin Levins MD   Signed by:   Corwin Levins MD on 11/21/2009   Method used:   Print then Give to Patient   RxID:   [id number] BUDEPRION XL 300 MG  TB24 (BUPROPION HCL) 1 in the am  #90 x 3   Entered and Authorized by:   Corwin Levins MD   Signed by:   Corwin Levins MD on 11/21/2009   Method used:   Print then Give to Patient   RxID:   [id number] BUDEPRION XL 300 MG  TB24 (BUPROPION HCL) 1 in the am  #30 x 11   Entered and Authorized by:   Corwin Levins MD   Signed by:   Corwin Levins MD on 11/21/2009   Method used:   Print then Give to Patient   RxID:   [id number] SAVELLA 100 MG TABS (MILNACIPRAN HCL) 1po two times a day  #180 x 3   Entered and Authorized by:   Corwin Levins MD   Signed by:   Corwin Levins MD on 11/21/2009   Method used:   Print then Give to Patient   RxID:   [id number] SAVELLA 100 MG TABS (MILNACIPRAN HCL) 1po two times a day  #60 x 11   Entered and Authorized by:   Corwin Levins MD   Signed by:   Corwin Levins MD on 11/21/2009   Method used:   Print then Give to Patient   RxID:   [id number]

## 2010-11-15 NOTE — Op Note (Signed)
NAMEJULIENNE, VOGLER NO.:  [id number]   MEDICAL RECORD NO.:  [id number]          PATIENT TYPE:  AMB   LOCATION:  DSC                          FACILITY:  [facility]   PHYSICIAN:  Currie Paris, M.D.DATE OF BIRTH:  Jul 24, 1950   DATE OF PROCEDURE:  12/17/2005  DATE OF DISCHARGE:                                 OPERATIVE REPORT   OFFICE MEDICAL RECORD NUMBER:  [id number].   PREOPERATIVE DIAGNOSIS:  1.  History of right breast cancer.  2.  Severe left mastitis possibly chemical induced.  3.  Unneeded Port-A-Cath.   CLINICAL HISTORY:  Ms. Vigilante is a 60 year old lady who has undergone a  right mastectomy for a second right breast cancer.  She has finished her  chemo.  She and Dr. Darnelle Catalan, her oncologist, had elected to proceed to a  prophylactic left mastectomy.  In the meantime, she has developed a severe  left mastitis which did not respond to antibiotics and was temporally  related somewhat to an administration dose of chemo.  It is unclear whether  this was related to that or some infectious process but it was elected to  proceed at this point to surgery.  In addition, her Port-A-Cath was  determined to be no longer needed and we would remove that at the same time.   DESCRIPTION OF PROCEDURE:  The patient was seen in the holding area and she  had no further questions.  We confirmed left mastectomy and Port-A-Cath  removal as the planned procedure.   The patient was taken into the operating room and after satisfactory general  anesthesia had been obtained, the left breast was prepped and draped as a  sterile field.  The time-out occurred.   I made a wide elliptical incision to try to excise most of the erythematous  skin.  The patient has fairly large breasts and I thought we would still  have plenty of skin to close with.   The skin was particularly edematous as was the subcutaneous tissues up  towards the Port-A-Cath site.  I raised a skin flap  superiorly and once I  entered the area where the reservoir was, I was able to back the Port-A-Cath  out and put a suture on the tract.   I continued to make the flap going medially to the sternum and superiorly to  the clavicle and then laterally out into the axilla.  This was fairly bloody  as the tissues as noted were edematous and hypervascular apparently from the  inflammatory process.  No frank pus was identified.   The inferior flap was made in a similar fashion going down to just below the  inframammary fold and then taking it out laterally to the latissimus.  The  breast was then removed from medial to lateral.  As we were disconnecting it  from the clavipectoral fascia, there were multiple soft enlarged nodes which  were enlarged secondary to the inflammatory process but I took at least one  of the nodes for pathological exam and this went with the specimen.   Once everything was off,  I spent several minutes making sure everything was  dry.  I irrigated and checked again for hemostasis.  I put two 19 Blake  drains and secured them with 2-0 nylons.   Laterally there was a lot of excess skin and I thought this would produce a  dog ear.   I then excised some of the skin without really taking the  incision particularly laterally, but just took some of this axillary skin to  see if we could not get a smoother axillary area.   Again we irrigated to make sure everything was dry.  I tacked the flaps down  with some 3-0 Vicryl to see if this would diminish the postoperative seroma  formation.  The skin was then closed with staples.  I left 20 mL of 0.25%  plain Marcaine under the flaps for approximately 10 minutes before charging  the drains.   The patient tolerated the procedure well.  There no operative complications.  All counts were correct.  Estimated blood loss was 250 mL.      Currie Paris, M.D.  Electronically Signed     CJS/MEDQ  D:  12/17/2005  T:   12/17/2005  Job:  [id number]   cc:   Loreen Freud, M.D.  [facility] [place]  [place] [address]   Valentino Hue. Magrinat, M.D.  Fax: [phone]

## 2010-11-15 NOTE — Op Note (Signed)
[name], [name] NO.:  [id number]   MEDICAL RECORD NO.:  [id number]          PATIENT TYPE:  AMB   LOCATION:  DSC                          FACILITY:  [facility]   PHYSICIAN:  Currie Paris, M.D.DATE OF BIRTH:  1950/11/10   DATE OF PROCEDURE:  09/04/2005  DATE OF DISCHARGE:                                 OPERATIVE REPORT   PREOPERATIVE DIAGNOSIS:  Cancer right breast upper outer quadrant.   POSTOPERATIVE DIAGNOSIS:  Cancer right breast upper outer quadrant.   OPERATION:  Right total mastectomy.   SURGEON:  Currie Paris, M.D.   ASSISTANT:  Cherylynn Ridges, M.D.   ANESTHESIA:  General.   CLINICAL HISTORY:  This is a 60 year old lady who is status post right  lumpectomy, radiation and I believe chemotherapy for an upper inner quadrant  breast cancer.  She has now developed a new cancer in the same breast but  fairly far lateral and in the upper outer quadrant. After consultation with  Dr. Darnelle Catalan and full consideration of alternatives, she elected to proceed  to right total mastectomy.  Since she has already had a full node  dissection, no nodal evaluation was contemplated. She is considering whether  to have a delayed reconstruction.   DESCRIPTION OF PROCEDURE:  The patient was seen in the holding area and she  had no further questions.  We both identified and marked the right breast as  the operative side.   The patient was taken to the operating room and after satisfactory general  anesthesia had been obtained, the right breast was prepped and draped.  The  time-out occurred.  I outlined an elliptical incision to take a fairly  generous amount of skin because this was previously radiated skin incision  since she was not having immediate reconstruction, I did not want to have  any excess skin that might not lie down flat over the flaps. The superior  flap was made at going fairly thin and going to the sternum, then to the  clavicle, and out  into the axilla, elevating the old scar. The inferior  incision was made and that flap then created in a similar fashion going to  the inframammary fold and laterally to latissimus.  Once this was done, the  breast was removed from medial to lateral taking the fascia. Some clips  marking the prior lumpectomy site were noted. This was all done with  cautery. Once this was done, I spent few minutes making sure everything was  dry, irrigating and cauterizing.  I took excess fat in a couple places for  flaps, specifically the superior flap looked a little bit thick. Once  everything appeared to be dry, I carefully palpated the axilla and there was  no evidence of any metastatic disease there. Since she had already had a  formal dissection, no further entry into the axilla was made.   Two 19 Blake drains were placed through the inferior flap, one across the  superior flap and one into the axillary area. I did a final irrigation and a  final check for  hemostasis and again everything appeared to be dry.  The  skin was  closed with staples. The drains were secured with 2-0 nylons. I left  approximately 30 mL of 0.25% plain Marcaine under the flaps for  approximately 10 minutes before charging the drains.  The patient tolerated  procedure well. No complications. All counts were correct. Estimated blood  loss was less 100 mL.      Currie Paris, M.D.  Electronically Signed     CJS/MEDQ  D:  09/04/2005  T:  09/04/2005  Job:  [id number]   cc:   Loreen Freud, M.D.  [facility] [address] [place]  [place] [address]   Valentino Hue. Magrinat, M.D.  Fax: [phone]

## 2010-11-15 NOTE — Op Note (Signed)
NAMECECI, TALIAFERRO NO.:  [id number]   MEDICAL RECORD NO.:  [id number]          PATIENT TYPE:  AMB   LOCATION:  DSC                          FACILITY:  [facility]   PHYSICIAN:  Currie Paris, M.D.DATE OF BIRTH:  [birth year]   DATE OF PROCEDURE:  10/16/2005  DATE OF DISCHARGE:                                 OPERATIVE REPORT   OFFICE MEDICAL RECORD NUMBER:  [id number]   PREOPERATIVE DIAGNOSES:  1.  Cancer of left breast, status post mastectomy.  2.  Inadequate venous access for chemotherapy.  3.  Post-mastectomy seroma.   POSTOPERATIVE DIAGNOSES:  1.  Cancer of left breast, status post mastectomy.  2.  Inadequate venous access for chemotherapy.  3.  Post-mastectomy seroma.   OPERATION:  1.  Aspiration of post-mastectomy seroma.  2.  Port-A-Cath placement.   SURGEON:  Currie Paris, M.D.   ANESTHESIA:  MAC.   CLINICAL HISTORY:  This is a 60 year old lady who has developed a recurrent  right breast cancer and recently had a mastectomy.  She has been by the  office a couple of times for seroma aspirations of what is now just a very  small residual seroma.  We elected to aspirate that at the time of Port-A-  Cath placement to save her 1 trip to the office.  She also was getting ready  to have chemotherapy beginning tomorrow and needed IV access.   DESCRIPTION OF PROCEDURE:  The patient was seen in the holding area and she  had no further questions.  She has already had 1 prior Port-A-Cath, so she  is familiar with the procedure and we discussed trying to place it using the  same scar from the prior Port-A-Cath.  The patient was then taken to the  operating room and given IV sedation.  A timeout occurred.  I aspirated 20  mL from an axillary area on the right to complete resolve the seroma.  The  left breast and upper chest/lower neck area were prepped and draped into a  sterile field.   The patient was placed in Trendelenburg.  The area under the  left clavicle  was infiltrated with 1% Xylocaine.  The left subclavian vein was entered on  the initial attempt and the guidewire was threaded easily into the inferior  vena cava.  Additional local was infiltrated in the chest wall area and the  old scar from the Port-A-Cath excised.  A subcutaneous pocket was fashioned  with cautery.  A tunnel was made from here to the guidewire site using  additional local for the tunnel.   The Port-A-Cath tubing was pulled through the subcutaneous tunnel and  flushed.  The guidewire tract was dilated once with the dilator peel-away  sheath.  The dilator and guidewire were removed and the catheter threaded  easily to approximately 24 cm.  It aspirated and irrigated easily.   On fluoroscopy, I saw that it had actually made a U turn in the superior  vena cava.  Using fluoroscopy, I backed this up until the catheter returned  straight and advanced it into  the right atrium.  It aspirated and irrigated  easily.  I then backed it out so that it was in the superior vena cava near  the junction of the right atrium and 18 cm from the skin.   It aspirated and flushed easily.  The reservoir was flushed and attachment  and locking mechanism engaged.  That aspirated and irrigated easily.   That was secured to the deep tissues with some 3-0 Vicryl.  The subcu was  closed with some 3-0 Vicryl.  The catheter reservoir was accessed and  flushed with dilute heparin.  Final fluoroscopy showed good positioning in  the sphinx.   The skin was closed with 4-0 Monocryl subcuticulars and some Steri-Strips.  The catheter was flushed a final time with concentrated heparin and capped  and left to access.   The patient tolerated the procedure well.  There were no operative  complications.  All counts were correct.      Currie Paris, M.D.  Electronically Signed     CJS/MEDQ  D:  10/16/2005  T:  10/17/2005  Job:  [id number]

## 2010-11-29 ENCOUNTER — Other Ambulatory Visit: Payer: Self-pay | Admitting: Internal Medicine

## 2010-12-26 ENCOUNTER — Other Ambulatory Visit: Payer: Self-pay | Admitting: Internal Medicine

## 2010-12-27 ENCOUNTER — Other Ambulatory Visit: Payer: Self-pay

## 2010-12-27 MED ORDER — OMEPRAZOLE 20 MG PO CPDR: 40.0000 mg | DELAYED_RELEASE_CAPSULE | Freq: Every day | ORAL | Status: DC

## 2010-12-27 MED ORDER — CHOLESTYRAMINE 4 GM/DOSE PO POWD: 4.0000 g | Freq: Every day | ORAL | Status: DC

## 2010-12-27 MED ORDER — BUPROPION HCL ER (XL) 300 MG PO TB24: 300.0000 mg | ORAL_TABLET | ORAL | Status: DC

## 2010-12-27 MED ORDER — FLUOXETINE HCL 20 MG PO CAPS: 20.0000 mg | ORAL_CAPSULE | Freq: Every day | ORAL | Status: DC

## 2010-12-27 MED ORDER — LOSARTAN POTASSIUM 25 MG PO TABS: 25.0000 mg | ORAL_TABLET | Freq: Every day | ORAL | Status: DC

## 2010-12-27 NOTE — Telephone Encounter (Signed)
Pt has appt scheduled with Dr Jonny Ruiz 02/20/2011

## 2011-02-04 DIAGNOSIS — Z853 Personal history of malignant neoplasm of breast: Secondary | ICD-10-CM

## 2011-02-07 ENCOUNTER — Other Ambulatory Visit: Payer: Self-pay

## 2011-02-07 ENCOUNTER — Other Ambulatory Visit: Payer: Self-pay | Admitting: Internal Medicine

## 2011-02-07 DIAGNOSIS — Z0000 Encounter for general adult medical examination without abnormal findings: Secondary | ICD-10-CM

## 2011-02-08 ENCOUNTER — Encounter: Payer: Self-pay | Admitting: Internal Medicine

## 2011-02-08 DIAGNOSIS — Z0000 Encounter for general adult medical examination without abnormal findings: Secondary | ICD-10-CM | POA: Insufficient documentation

## 2011-02-10 ENCOUNTER — Other Ambulatory Visit (INDEPENDENT_AMBULATORY_CARE_PROVIDER_SITE_OTHER): Payer: Self-pay | Admitting: Internal Medicine

## 2011-02-10 ENCOUNTER — Other Ambulatory Visit (INDEPENDENT_AMBULATORY_CARE_PROVIDER_SITE_OTHER): Payer: Self-pay

## 2011-02-10 DIAGNOSIS — Z0000 Encounter for general adult medical examination without abnormal findings: Secondary | ICD-10-CM

## 2011-02-10 LAB — LIPID PANEL
Cholesterol: 139 mg/dL (ref 0–200)
Total CHOL/HDL Ratio: 2
Triglycerides: 68 mg/dL (ref 0.0–149.0)

## 2011-02-10 LAB — CBC WITH DIFFERENTIAL/PLATELET
Basophils Absolute: 0.1 10*3/uL (ref 0.0–0.1)
Eosinophils Absolute: 0 10*3/uL (ref 0.0–0.7)
Hemoglobin: 12.3 g/dL (ref 12.0–15.0)
Lymphocytes Relative: 28.6 % (ref 12.0–46.0)
MCHC: 32.6 g/dL (ref 30.0–36.0)
MCV: 83.3 fl (ref 78.0–100.0)
Monocytes Absolute: 0.5 10*3/uL (ref 0.1–1.0)
Neutro Abs: 5.6 10*3/uL (ref 1.4–7.7)
RDW: 15.4 % — ABNORMAL HIGH (ref 11.5–14.6)

## 2011-02-10 LAB — HEMOGLOBIN A1C: Hgb A1c MFr Bld: 6 % (ref 4.6–6.5)

## 2011-02-10 LAB — BASIC METABOLIC PANEL
Calcium: 8.8 mg/dL (ref 8.4–10.5)
Creatinine, Ser: 0.8 mg/dL (ref 0.4–1.2)

## 2011-02-10 LAB — URINALYSIS, ROUTINE W REFLEX MICROSCOPIC
Bilirubin Urine: NEGATIVE
Hgb urine dipstick: NEGATIVE
Ketones, ur: NEGATIVE
Specific Gravity, Urine: 1.025 (ref 1.000–1.030)
Urobilinogen, UA: 0.2 (ref 0.0–1.0)

## 2011-02-10 LAB — HEPATIC FUNCTION PANEL
AST: 35 U/L (ref 0–37)
Albumin: 4.1 g/dL (ref 3.5–5.2)
Alkaline Phosphatase: 62 U/L (ref 39–117)
Total Protein: 7.3 g/dL (ref 6.0–8.3)

## 2011-02-12 ENCOUNTER — Encounter: Payer: Self-pay | Admitting: Internal Medicine

## 2011-02-12 ENCOUNTER — Ambulatory Visit (INDEPENDENT_AMBULATORY_CARE_PROVIDER_SITE_OTHER): Payer: Managed Care, Other (non HMO) | Admitting: Internal Medicine

## 2011-02-12 VITALS — BP 100/72 | HR 95 | Temp 98.2°F | Ht 62.0 in | Wt 201.0 lb

## 2011-02-12 DIAGNOSIS — Z8669 Personal history of other diseases of the nervous system and sense organs: Secondary | ICD-10-CM

## 2011-02-12 DIAGNOSIS — I10 Essential (primary) hypertension: Secondary | ICD-10-CM

## 2011-02-12 DIAGNOSIS — Z23 Encounter for immunization: Secondary | ICD-10-CM

## 2011-02-12 DIAGNOSIS — Z0000 Encounter for general adult medical examination without abnormal findings: Secondary | ICD-10-CM

## 2011-02-12 HISTORY — DX: Personal history of other diseases of the nervous system and sense organs: Z86.69

## 2011-02-12 MED ORDER — MILNACIPRAN HCL 100 MG PO TABS: ORAL_TABLET | ORAL | Status: DC

## 2011-02-12 MED ORDER — TRAZODONE HCL 50 MG PO TABS: 50.0000 mg | ORAL_TABLET | Freq: Every day | ORAL | Status: DC

## 2011-02-12 MED ORDER — ALENDRONATE SODIUM 10 MG PO TABS: 10.0000 mg | ORAL_TABLET | Freq: Every day | ORAL | Status: DC

## 2011-02-12 MED ORDER — LOSARTAN POTASSIUM 25 MG PO TABS: 25.0000 mg | ORAL_TABLET | Freq: Every day | ORAL | Status: DC

## 2011-02-12 MED ORDER — DICLOFENAC SODIUM 75 MG PO TBEC: 75.0000 mg | DELAYED_RELEASE_TABLET | Freq: Two times a day (BID) | ORAL | Status: DC

## 2011-02-12 MED ORDER — CYCLOBENZAPRINE HCL 10 MG PO TABS: 10.0000 mg | ORAL_TABLET | Freq: Two times a day (BID) | ORAL | Status: DC | PRN

## 2011-02-12 MED ORDER — FLUOXETINE HCL 20 MG PO CAPS: 20.0000 mg | ORAL_CAPSULE | Freq: Three times a day (TID) | ORAL | Status: DC

## 2011-02-12 MED ORDER — OMEPRAZOLE 20 MG PO CPDR: 40.0000 mg | DELAYED_RELEASE_CAPSULE | Freq: Every day | ORAL | Status: DC

## 2011-02-12 MED ORDER — BUPROPION HCL ER (XL) 300 MG PO TB24: 300.0000 mg | ORAL_TABLET | ORAL | Status: DC

## 2011-02-12 MED ORDER — CHOLESTYRAMINE 4 GM/DOSE PO POWD: 4.0000 g | Freq: Two times a day (BID) | ORAL | Status: DC

## 2011-02-12 MED ORDER — GLUCOSE BLOOD VI STRP: ORAL_STRIP | Status: DC

## 2011-02-12 NOTE — Assessment & Plan Note (Signed)
To see optho in f/u, also had cataracts and bifocals need updated

## 2011-02-12 NOTE — Patient Instructions (Addendum)
Please remember to followup with your GYN for the yearly pap smear and/or mammogram You will be contacted regarding the referral for: opthomologist on a Friday You had the shingles shot today Continue all other medications as before Your 30 days refills were all sent to the pharmacy, and you are given the 90 day refills hardcopy Please return in 1 year for your yearly visit, or sooner if needed, with Lab testing done 3-5 days before

## 2011-02-12 NOTE — Progress Notes (Signed)
Subjective:    Patient ID: [name], female    DOB: 01/24/1951, 60 y.o.   MRN: [id number]  HPI Here for wellness and f/u;  Overall doing ok;  Pt denies CP, worsening SOB, DOE, wheezing, orthopnea, PND, worsening LE edema, palpitations, dizziness or syncope.  Pt denies neurological change such as new Headache, facial or extremity weakness.  Pt denies polydipsia, polyuria, or low sugar symptoms. Pt states overall good compliance with treatment and medications, good tolerability, and trying to follow lower cholesterol diet.  Pt denies worsening depressive symptoms, suicidal ideation or panic. No fever, wt loss, night sweats, loss of appetite, or other constitutional symptoms.  Pt states good ability with ADL's, low fall risk, home safety reviewed and adequate, no significant changes in hearing or vision, and occasionally active with exercise.  No other specific new complaints today Past Medical History  Diagnosis Date  . ARTHRITIS 10/25/2007  . BREAST CANCER, HX OF 08/03/2006  . DEPRESSION 08/03/2006  . DIABETES MELLITUS, TYPE II 08/03/2006  . DYSPNEA ON EXERTION 11/21/2009  . EPISTAXIS, RECURRENT 01/27/2008  . FIBROMYALGIA 01/13/2008  . GERD 04/30/2008  . HEPATITIS C 08/03/2006  . HIP PAIN, RIGHT 04/30/2007  . HYPERTENSION 04/30/2008  . INSOMNIA-SLEEP DISORDER-UNSPEC 11/21/2009  . KNEE SPRAIN, ACUTE 01/27/2008  . LEG PAIN, LEFT 01/13/2008  . OSTEOPENIA 07/26/2007  . Persistent vomiting 08/17/2008   Past Surgical History  Procedure Date  . Breast lumpectomy 2000    right with axillary exploration  . Mastectomy 2007  . Tonsillectomy   . S/p right broken leg with surgury as teen   . Tubal ligation   . S/p thyroid fna neg.     reports that she has been smoking.  She does not have any smokeless tobacco history on file. She reports that she drinks alcohol. She reports that she does not use illicit drugs. family history includes Heart disease in her mother. Allergies  Allergen Reactions  . Iohexol     Desc: Pt has been pre medicated with prednisone before scans, pt is unsure of type of contrast she reacted to.   . Venlafaxine     REACTION: night sweats   Current Outpatient Prescriptions on File Prior to Visit  Medication Sig Dispense Refill  . aspirin 81 MG tablet Take 81 mg by mouth daily.        Marland Kitchen HYDROcodone-acetaminophen (VICODIN) 5-500 MG per tablet 1 by mouth four times a day as needed for recurrent back pain       . Lancets MISC Use as directed once daily        Review of Systems Review of Systems  Constitutional: Negative for diaphoresis, activity change, appetite change and unexpected weight change.  HENT: Negative for hearing loss, ear pain, facial swelling, mouth sores and neck stiffness.   Eyes: Negative for pain, redness and visual disturbance.  Respiratory: Negative for shortness of breath and wheezing.   Cardiovascular: Negative for chest pain and palpitations.  Gastrointestinal: Negative for diarrhea, blood in stool, abdominal distention and rectal pain.  Genitourinary: Negative for hematuria, flank pain and decreased urine volume.  Musculoskeletal: Negative for myalgias and joint swelling.  Skin: Negative for color change and wound.  Neurological: Negative for syncope and numbness.  Hematological: Negative for adenopathy.  Psychiatric/Behavioral: Negative for hallucinations, self-injury, decreased concentration and agitation.      Objective:   Physical Exam BP 100/72  Pulse 95  Temp(Src) 98.2 F (36.8 C) (Oral)  Ht 5\' 2"  (1.575 m)  Wt 201  lb (91.173 kg)  BMI 36.76 kg/m2  SpO2 94% Physical Exam  VS noted, obese Constitutional: Pt is oriented to person, place, and time. Appears well-developed and well-nourished.  HENT:  Head: Normocephalic and atraumatic.  Right Ear: External ear normal.  Left Ear: External ear normal.  Nose: Nose normal.  Mouth/Throat: Oropharynx is clear and moist.  Eyes: Conjunctivae and EOM are normal. Pupils are equal, round, and  reactive to light.  Neck: Normal range of motion. Neck supple. No JVD present. No tracheal deviation present.  Cardiovascular: Normal rate, regular rhythm, normal heart sounds and intact distal pulses.   Pulmonary/Chest: Effort normal and breath sounds normal.  Abdominal: Soft. Bowel sounds are normal. There is no tenderness.  Musculoskeletal: Normal range of motion. Exhibits no edema.  Lymphadenopathy:  Has no cervical adenopathy.  Neurological: Pt is alert and oriented to person, place, and time. Pt has normal reflexes. No cranial nerve deficit. Motor/sens/dtr intact  Skin: Skin is warm and dry. No rash noted.  Psychiatric:  Behavior is normal. 1-2+ nervous        Assessment & Plan:

## 2011-05-11 ENCOUNTER — Emergency Department (HOSPITAL_COMMUNITY)
Admission: EM | Admit: 2011-05-11 | Discharge: 2011-05-11 | Disposition: A | Discharge status: Home / Self Care | Payer: Self-pay | Attending: Emergency Medicine | Admitting: Emergency Medicine

## 2011-05-11 ENCOUNTER — Encounter (HOSPITAL_COMMUNITY): Payer: Self-pay | Admitting: *Deleted

## 2011-05-11 DIAGNOSIS — Z8739 Personal history of other diseases of the musculoskeletal system and connective tissue: Secondary | ICD-10-CM | POA: Insufficient documentation

## 2011-05-11 DIAGNOSIS — R040 Epistaxis: Secondary | ICD-10-CM | POA: Insufficient documentation

## 2011-05-11 DIAGNOSIS — E119 Type 2 diabetes mellitus without complications: Secondary | ICD-10-CM | POA: Insufficient documentation

## 2011-05-11 DIAGNOSIS — Z79899 Other long term (current) drug therapy: Secondary | ICD-10-CM | POA: Insufficient documentation

## 2011-05-11 DIAGNOSIS — Z853 Personal history of malignant neoplasm of breast: Secondary | ICD-10-CM | POA: Insufficient documentation

## 2011-05-11 DIAGNOSIS — I10 Essential (primary) hypertension: Secondary | ICD-10-CM | POA: Insufficient documentation

## 2011-05-11 MED ORDER — OXYMETAZOLINE HCL 0.05 % NA SOLN: 1.0000 | Freq: Once | NASAL | Status: DC

## 2011-05-11 MED ORDER — COCAINE HCL 4 % EX SOLN
4.0000 mL | Freq: Once | CUTANEOUS | Status: AC
  Administered 2011-05-11: 4 mL via NASAL
  Filled 2011-05-11: qty 4

## 2011-05-11 MED ORDER — CETIRIZINE-PSEUDOEPHEDRINE ER 5-120 MG PO TB12: 1.0000 | ORAL_TABLET | Freq: Two times a day (BID) | ORAL | Status: AC

## 2011-05-11 MED ORDER — COCAINE HCL 4 % EX SOLN: 4.0000 mL | Freq: Once | CUTANEOUS | Status: DC

## 2011-05-11 MED ORDER — OXYMETAZOLINE HCL 0.05 % NA SOLN
1.0000 | Freq: Once | NASAL | Status: AC
  Administered 2011-05-11: 1 via NASAL
  Filled 2011-05-11: qty 15

## 2011-05-11 MED ORDER — SILVER NITRATE-POT NITRATE 75-25 % EX MISC: 1.0000 | Freq: Once | CUTANEOUS | Status: DC

## 2011-05-11 MED ORDER — SILVER NITRATE-POT NITRATE 75-25 % EX MISC
1.0000 | Freq: Once | CUTANEOUS | Status: AC
  Administered 2011-05-11: 1 via TOPICAL
  Filled 2011-05-11: qty 1

## 2011-05-11 NOTE — ED Notes (Signed)
Pt in c/o intermittent nose bleed since yesterday around 530, pt states nose bleed with stop and then start again, states she has been unable to get bleeding to stop since around 3am this morning, pt actively bleeding at this time

## 2011-05-11 NOTE — ED Provider Notes (Signed)
History     CSN: [id number] Arrival date & time: 05/11/2011  6:18 AM   First MD Initiated Contact with Patient 05/11/11 [phone]      Chief Complaint  Patient presents with  . Epistaxis    (Consider location/radiation/quality/duration/timing/severity/associated sxs/prior treatment) Patient is a 60 y.o. female presenting with nosebleeds. The history is provided by the patient.  Epistaxis   pt c/o nosebleed, bil nares, on and off in past 1-2 days. Hx same. States recent nasal congestion, sneezing, blowing nose. No other abn bleeding or bruising. No anticoagulant use. No faintness or dizziness. No headache.   Past Medical History  Diagnosis Date  . ARTHRITIS 10/25/2007  . BREAST CANCER, HX OF 08/03/2006  . DEPRESSION 08/03/2006  . DIABETES MELLITUS, TYPE II 08/03/2006  . DYSPNEA ON EXERTION 11/21/2009  . EPISTAXIS, RECURRENT 01/27/2008  . FIBROMYALGIA 01/13/2008  . GERD 04/30/2008  . HEPATITIS C 08/03/2006  . HIP PAIN, RIGHT 04/30/2007  . HYPERTENSION 04/30/2008  . INSOMNIA-SLEEP DISORDER-UNSPEC 11/21/2009  . KNEE SPRAIN, ACUTE 01/27/2008  . LEG PAIN, LEFT 01/13/2008  . OSTEOPENIA 07/26/2007  . Persistent vomiting 08/17/2008    Past Surgical History  Procedure Date  . Breast lumpectomy 2000    right with axillary exploration  . Mastectomy 2007  . Tonsillectomy   . S/p right broken leg with surgury as teen   . Tubal ligation   . S/p thyroid fna neg.     Family History  Problem Relation Age of Onset  . Heart disease Mother     History  Substance Use Topics  . Smoking status: Current Everyday Smoker  . Smokeless tobacco: Not on file   Comment: 2-4 ppd but quit in 2007  . Alcohol Use: Yes     rare    OB History    Grav Para Term Preterm Abortions TAB SAB Ect Mult Living                  Review of Systems  Constitutional: Negative for fever and chills.  HENT: Positive for nosebleeds and congestion.   Neurological: Negative for light-headedness.  Hematological: Does not  bruise/bleed easily.    Allergies  Iohexol and Venlafaxine  Home Medications   Current Outpatient Rx  Name Route Sig Dispense Refill  . ALENDRONATE SODIUM 10 MG PO TABS Oral Take 1 tablet (10 mg total) by mouth daily. Take with a full glass of water on an empty stomach. 4 tablet 11  . ALENDRONATE SODIUM 10 MG PO TABS Oral Take 1 tablet (10 mg total) by mouth daily. Take with a full glass of water on an empty stomach. 12 tablet 3  . ASPIRIN 81 MG PO TABS Oral Take 81 mg by mouth daily.      . BUPROPION HCL ER (XL) 300 MG PO TB24 Oral Take 1 tablet (300 mg total) by mouth every morning. 90 tablet 3  . CHOLESTYRAMINE 4 GM/DOSE PO POWD Oral Take 1 packet (4 g total) by mouth 2 (two) times daily with a meal. 180 packet 3  . CYCLOBENZAPRINE HCL 10 MG PO TABS Oral Take 1 tablet (10 mg total) by mouth 2 (two) times daily as needed. 60 tablet 11  . CYCLOBENZAPRINE HCL 10 MG PO TABS Oral Take 1 tablet (10 mg total) by mouth 2 (two) times daily as needed. 180 tablet 3  . DICLOFENAC SODIUM 75 MG PO TBEC Oral Take 1 tablet (75 mg total) by mouth 2 (two) times daily. 180 tablet 3  . FLUOXETINE  HCL 20 MG PO CAPS Oral Take 1 capsule (20 mg total) by mouth 3 (three) times daily. 270 capsule 3  . GLUCOSE BLOOD VI STRP  Use as directed once daily 100 each 5  . HYDROCODONE-ACETAMINOPHEN 5-500 MG PO TABS  1 by mouth four times a day as needed for recurrent back pain     . LANCETS MISC  Use as directed once daily     . LOSARTAN POTASSIUM 25 MG PO TABS Oral Take 1 tablet (25 mg total) by mouth daily. 90 tablet 3  . MILNACIPRAN HCL 100 MG PO TABS  1/2 by mouth two times a day 90 tablet 3  . OMEPRAZOLE 20 MG PO CPDR Oral Take 2 capsules (40 mg total) by mouth daily. 180 capsule 3  . TRAZODONE HCL 50 MG PO TABS Oral Take 1 tablet (50 mg total) by mouth at bedtime. 90 tablet 3    BP 127/85  Pulse 128  Temp(Src) 98.5 F (36.9 C) (Oral)  Resp 20  SpO2 98%  Physical Exam  Nursing note and vitals  reviewed. Constitutional: She appears well-developed and well-nourished. No distress.  HENT:  Mouth/Throat: Oropharynx is clear and moist.       bil holding pressure. Dried blood bil nares. No bleeding posteriorly.   Eyes: Conjunctivae are normal. No scleral icterus.  Neck: Neck supple. No tracheal deviation present.  Cardiovascular: Normal rate.   Pulmonary/Chest: Effort normal. No respiratory distress.  Abdominal: Normal appearance. She exhibits no distension.  Musculoskeletal: She exhibits no edema.  Neurological: She is alert.  Skin: Skin is warm and dry. No rash noted.       No petechia  Psychiatric: She has a normal mood and affect.    ED Course  Procedures (including critical care time)  Labs Reviewed - No data to display No results found.   No diagnosis found.    MDM  Pressure held.   Clots removed. Afrin and top cocaine solution on cotton/packing, inserted for few minutes. Removed. Able to visualize small vessel ant septum, slow ooze of blood, topical silver nitrate applied.  Recheck no bleeding ant or post.    Hr 84 rr 16. No new c/o    Suzi Roots, MD 05/11/11 [phone]

## 2011-05-11 NOTE — ED Notes (Signed)
MD at bedside with ENT tray.

## 2011-05-11 NOTE — ED Notes (Signed)
Pt a/o x 4, resting quietly, skin warm and dry, respirations even and unlabored, no nosebleed at present, tolerated procedure well

## 2011-09-12 ENCOUNTER — Ambulatory Visit (INDEPENDENT_AMBULATORY_CARE_PROVIDER_SITE_OTHER): Payer: Self-pay | Admitting: Internal Medicine

## 2011-09-12 ENCOUNTER — Encounter: Payer: Self-pay | Admitting: Internal Medicine

## 2011-09-12 VITALS — BP 110/80 | HR 115 | Temp 97.2°F | Ht 61.0 in | Wt 211.1 lb

## 2011-09-12 DIAGNOSIS — I10 Essential (primary) hypertension: Secondary | ICD-10-CM

## 2011-09-12 DIAGNOSIS — E119 Type 2 diabetes mellitus without complications: Secondary | ICD-10-CM

## 2011-09-12 DIAGNOSIS — M25559 Pain in unspecified hip: Secondary | ICD-10-CM

## 2011-09-12 DIAGNOSIS — F329 Major depressive disorder, single episode, unspecified: Secondary | ICD-10-CM

## 2011-09-12 DIAGNOSIS — Z0000 Encounter for general adult medical examination without abnormal findings: Secondary | ICD-10-CM

## 2011-09-12 DIAGNOSIS — M25551 Pain in right hip: Secondary | ICD-10-CM | POA: Insufficient documentation

## 2011-09-12 MED ORDER — TRAMADOL HCL 50 MG PO TABS: 50.0000 mg | ORAL_TABLET | Freq: Four times a day (QID) | ORAL | Status: AC | PRN

## 2011-09-12 NOTE — Patient Instructions (Signed)
Take all new medications as prescribed Continue all other medications as before Please call for orthopedic referral if the hip pain worsens or persists You are given the work note today Please return in 6 mo with Lab testing done 3-5 days before

## 2011-09-14 ENCOUNTER — Encounter: Payer: Self-pay | Admitting: Internal Medicine

## 2011-09-14 NOTE — Assessment & Plan Note (Signed)
stable overall by hx and exam, most recent data reviewed with pt, and pt to continue medical treatment as before  Lab Results  Component Value Date   HGBA1C 6.0 02/10/2011

## 2011-09-14 NOTE — Progress Notes (Signed)
Subjective:    Patient ID: [name], female    DOB: 1951-03-09, 61 y.o.   MRN: [id number]  HPI  Here to be seen with c/o right hip/groin pain now incidentally much improved today but has been mod and even severe for 5 days since flared up with walking much of the last Sunday shopping.  Pain worse on standing and ambulation since then, better with rest and been off her feet much of the last 2 days,  Needs note to go back to work which is much of why she is here, and lack of insurance may have been a factor on not coming sooner,  No falls, no LBP , and no bowel or bladder change, fever, wt loss,  worsening LE pain/numbness/weakness, gait change or falls except has been tyring to put less wt on the RLE when walking.  Pt denies chest pain, increased sob or doe, wheezing, orthopnea, PND, increased LE swelling, palpitations, dizziness or syncope.  Pt denies new neurological symptoms such as new headache, or facial or extremity weakness or numbness   Pt denies polydipsia.  Denies worsening depressive symptoms, suicidal ideation, or panic, though has ongoing anxiety, not increased recently.  Past Medical History  Diagnosis Date  . ARTHRITIS 10/25/2007  . BREAST CANCER, HX OF 08/03/2006  . DEPRESSION 08/03/2006  . DIABETES MELLITUS, TYPE II 08/03/2006  . DYSPNEA ON EXERTION 11/21/2009  . EPISTAXIS, RECURRENT 01/27/2008  . FIBROMYALGIA 01/13/2008  . GERD 04/30/2008  . HEPATITIS C 08/03/2006  . HIP PAIN, RIGHT 04/30/2007  . HYPERTENSION 04/30/2008  . INSOMNIA-SLEEP DISORDER-UNSPEC 11/21/2009  . KNEE SPRAIN, ACUTE 01/27/2008  . LEG PAIN, LEFT 01/13/2008  . OSTEOPENIA 07/26/2007  . Persistent vomiting 08/17/2008   Past Surgical History  Procedure Date  . Breast lumpectomy 2000    right with axillary exploration  . Mastectomy 2007  . Tonsillectomy   . S/p right broken leg with surgury as teen   . Tubal ligation   . S/p thyroid fna neg.     reports that she has been smoking.  She does not have any smokeless  tobacco history on file. She reports that she drinks alcohol. She reports that she does not use illicit drugs. family history includes Heart disease in her mother. Allergies  Allergen Reactions  . Iohexol      Desc: Pt has been pre medicated with prednisone before scans, pt is unsure of type of contrast she reacted to.   . Venlafaxine     REACTION: night sweats  . Tape Rash    "Tears my skin all up"   Current Outpatient Prescriptions on File Prior to Visit  Medication Sig Dispense Refill  . alendronate (FOSAMAX) 10 MG tablet Take 1 tablet (10 mg total) by mouth daily. Take with a full glass of water on an empty stomach.  12 tablet  3  . alendronate (FOSAMAX) 10 MG tablet Take 10 mg by mouth daily. Take with a full glass of water on an empty stomach.       Marland Kitchen aspirin 81 MG tablet Take 81 mg by mouth daily.       Marland Kitchen buPROPion (WELLBUTRIN XL) 300 MG 24 hr tablet Take 300 mg by mouth every morning.        . cetirizine-pseudoephedrine (ZYRTEC-D) 5-120 MG per tablet Take 1 tablet by mouth 2 (two) times daily.  20 tablet  0  . cholestyramine (QUESTRAN) 4 GM/DOSE powder Take 4 g by mouth 2 (two) times daily with a meal.  2 scoops       . cholestyramine (QUESTRAN) 4 GM/DOSE powder Take 8 g by mouth 2 (two) times daily with a meal.        . cyclobenzaprine (FLEXERIL) 10 MG tablet Take 1 tablet (10 mg total) by mouth 2 (two) times daily as needed.  180 tablet  3  . cyclobenzaprine (FLEXERIL) 10 MG tablet Take 10 mg by mouth 2 (two) times daily as needed. For muscle spasms.       . diclofenac (VOLTAREN) 75 MG EC tablet Take 75 mg by mouth 2 (two) times daily.        Marland Kitchen FLUoxetine (PROZAC) 20 MG capsule Take 20 mg by mouth 3 (three) times daily.        Marland Kitchen glucose blood (ONE TOUCH ULTRA TEST) test strip Use as directed once daily  100 each  5  . HYDROcodone-acetaminophen (VICODIN) 5-500 MG per tablet Take 1 tablet by mouth every 6 (six) hours as needed. For recurrent back pain.      . Lancets MISC Use as  directed once daily       . losartan (COZAAR) 25 MG tablet Take 25 mg by mouth daily.        . Milnacipran HCl 100 MG TABS Take 50 mg by mouth 2 (two) times daily.        Marland Kitchen omeprazole (PRILOSEC) 20 MG capsule Take 40 mg by mouth daily.        . traZODone (DESYREL) 50 MG tablet Take 50 mg by mouth at bedtime as needed. For sleep.       Review of Systems Review of Systems  Constitutional: Negative for diaphoresis and unexpected weight change.  HENT: Negative for drooling and tinnitus.   Eyes: Negative for photophobia and visual disturbance.  Respiratory: Negative for choking and stridor.   Gastrointestinal: Negative for vomiting and blood in stool.  Genitourinary: Negative for hematuria and decreased urine volume.    Objective:   Physical Exam BP 110/80  Pulse 115  Temp(Src) 97.2 F (36.2 C) (Oral)  Ht 5\' 1"  (1.549 m)  Wt 211 lb 2 oz (95.766 kg)  BMI 39.89 kg/m2  SpO2 97% Physical Exam  VS noted Constitutional: Pt appears well-developed and well-nourished.  HENT: Head: Normocephalic.  Right Ear: External ear normal.  Left Ear: External ear normal.  Eyes: Conjunctivae and EOM are normal. Pupils are equal, round, and reactive to light.  Neck: Normal range of motion. Neck supple.  Cardiovascular: Normal rate and regular rhythm.   Pulmonary/Chest: Effort normal and breath sounds normal.  Abd:  Soft, NT, non-distended, + BS Neurological: Pt is alert. No cranial nerve deficit.  Skin: Skin is warm. No erythema.  Psychiatric: Pt behavior is normal. Thought content normal.  Spine:  nontender No paravertebral tender No tender over the right greater trochanter or upper leg or groin + pain reproduced with right hip joint flexion and int/ext rotation    Assessment & Plan:

## 2011-09-14 NOTE — Assessment & Plan Note (Signed)
stable overall by hx and exam, most recent data reviewed with pt, and pt to continue medical treatment as before  Lab Results  Component Value Date   WBC 8.7 02/10/2011   HGB 12.3 02/10/2011   HCT 37.8 02/10/2011   PLT 216.0 02/10/2011   GLUCOSE 127* 02/10/2011   CHOL 139 02/10/2011   TRIG 68.0 02/10/2011   HDL 67.90 02/10/2011   LDLDIRECT 64.6 10/16/2008   LDLCALC 58 02/10/2011   ALT 36* 02/10/2011   AST 35 02/10/2011   NA 136 02/10/2011   K 4.1 02/10/2011   CL 103 02/10/2011   CREATININE 0.8 02/10/2011   BUN 16 02/10/2011   CO2 23 02/10/2011   TSH 3.75 02/10/2011   HGBA1C 6.0 02/10/2011   MICROALBUR 1.2 11/21/2009

## 2011-09-14 NOTE — Assessment & Plan Note (Signed)
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

## 2011-09-14 NOTE — Assessment & Plan Note (Signed)
stable overall by hx and exam, most recent data reviewed with pt, and pt to continue medical treatment as before  BP Readings from Last 3 Encounters:  09/12/11 110/80  05/11/11 110/64  02/12/11 100/72

## 2012-03-12 ENCOUNTER — Ambulatory Visit: Payer: Self-pay | Admitting: Internal Medicine

## 2012-06-04 ENCOUNTER — Emergency Department (HOSPITAL_COMMUNITY): Payer: Self-pay

## 2012-06-04 ENCOUNTER — Inpatient Hospital Stay (HOSPITAL_COMMUNITY)
Admission: EM | Admit: 2012-06-04 | Discharge: 2012-06-07 | DRG: 417 | Disposition: A | Discharge status: Home / Self Care | Payer: MEDICAID | Attending: Surgery | Admitting: Surgery

## 2012-06-04 ENCOUNTER — Encounter (HOSPITAL_COMMUNITY): Payer: Self-pay | Admitting: Emergency Medicine

## 2012-06-04 DIAGNOSIS — I878 Other specified disorders of veins: Secondary | ICD-10-CM | POA: Diagnosis present

## 2012-06-04 DIAGNOSIS — K812 Acute cholecystitis with chronic cholecystitis: Secondary | ICD-10-CM | POA: Diagnosis present

## 2012-06-04 DIAGNOSIS — R0609 Other forms of dyspnea: Secondary | ICD-10-CM | POA: Diagnosis present

## 2012-06-04 DIAGNOSIS — Z853 Personal history of malignant neoplasm of breast: Secondary | ICD-10-CM

## 2012-06-04 DIAGNOSIS — K8010 Calculus of gallbladder with chronic cholecystitis without obstruction: Secondary | ICD-10-CM | POA: Diagnosis present

## 2012-06-04 DIAGNOSIS — K449 Diaphragmatic hernia without obstruction or gangrene: Secondary | ICD-10-CM | POA: Diagnosis present

## 2012-06-04 DIAGNOSIS — R05 Cough: Secondary | ICD-10-CM | POA: Diagnosis not present

## 2012-06-04 DIAGNOSIS — K8000 Calculus of gallbladder with acute cholecystitis without obstruction: Secondary | ICD-10-CM

## 2012-06-04 DIAGNOSIS — B1710 Acute hepatitis C without hepatic coma: Secondary | ICD-10-CM | POA: Diagnosis present

## 2012-06-04 DIAGNOSIS — K219 Gastro-esophageal reflux disease without esophagitis: Secondary | ICD-10-CM | POA: Diagnosis present

## 2012-06-04 DIAGNOSIS — K7689 Other specified diseases of liver: Secondary | ICD-10-CM | POA: Diagnosis present

## 2012-06-04 DIAGNOSIS — E119 Type 2 diabetes mellitus without complications: Secondary | ICD-10-CM | POA: Diagnosis present

## 2012-06-04 DIAGNOSIS — R058 Other specified cough: Secondary | ICD-10-CM | POA: Diagnosis not present

## 2012-06-04 DIAGNOSIS — K660 Peritoneal adhesions (postprocedural) (postinfection): Secondary | ICD-10-CM | POA: Diagnosis present

## 2012-06-04 DIAGNOSIS — D1803 Hemangioma of intra-abdominal structures: Secondary | ICD-10-CM

## 2012-06-04 DIAGNOSIS — B1920 Unspecified viral hepatitis C without hepatic coma: Secondary | ICD-10-CM | POA: Diagnosis present

## 2012-06-04 DIAGNOSIS — J869 Pyothorax without fistula: Secondary | ICD-10-CM | POA: Diagnosis present

## 2012-06-04 DIAGNOSIS — IMO0001 Reserved for inherently not codable concepts without codable children: Secondary | ICD-10-CM | POA: Diagnosis present

## 2012-06-04 DIAGNOSIS — F3289 Other specified depressive episodes: Secondary | ICD-10-CM | POA: Diagnosis present

## 2012-06-04 DIAGNOSIS — K810 Acute cholecystitis: Secondary | ICD-10-CM

## 2012-06-04 DIAGNOSIS — F329 Major depressive disorder, single episode, unspecified: Secondary | ICD-10-CM | POA: Diagnosis present

## 2012-06-04 DIAGNOSIS — I10 Essential (primary) hypertension: Secondary | ICD-10-CM | POA: Diagnosis present

## 2012-06-04 DIAGNOSIS — K529 Noninfective gastroenteritis and colitis, unspecified: Secondary | ICD-10-CM | POA: Diagnosis present

## 2012-06-04 DIAGNOSIS — K560 Paralytic ileus: Secondary | ICD-10-CM | POA: Diagnosis not present

## 2012-06-04 HISTORY — DX: Personal history of other diseases of the nervous system and sense organs: Z86.69

## 2012-06-04 HISTORY — DX: Other specified postprocedural states: Z98.890

## 2012-06-04 LAB — HEPATIC FUNCTION PANEL
AST: 25 U/L (ref 0–37)
Albumin: 3.8 g/dL (ref 3.5–5.2)
Total Bilirubin: 0.4 mg/dL (ref 0.3–1.2)

## 2012-06-04 LAB — LIPASE, BLOOD: Lipase: 62 U/L — ABNORMAL HIGH (ref 11–59)

## 2012-06-04 LAB — CBC
Hemoglobin: 11.5 g/dL — ABNORMAL LOW (ref 12.0–15.0)
MCH: 25.6 pg — ABNORMAL LOW (ref 26.0–34.0)
MCV: 78.4 fL (ref 78.0–100.0)
RBC: 4.5 MIL/uL (ref 3.87–5.11)

## 2012-06-04 LAB — BASIC METABOLIC PANEL
CO2: 22 mEq/L (ref 19–32)
Calcium: 9.8 mg/dL (ref 8.4–10.5)
Glucose, Bld: 131 mg/dL — ABNORMAL HIGH (ref 70–99)
Sodium: 131 mEq/L — ABNORMAL LOW (ref 135–145)

## 2012-06-04 LAB — POCT I-STAT TROPONIN I

## 2012-06-04 LAB — TROPONIN I: Troponin I: <0.3 ng/mL (ref <0.30)

## 2012-06-04 MED ORDER — INSULIN ASPART 100 UNIT/ML ~~LOC~~ SOLN: 0.0000 [IU] | Freq: Every day | SUBCUTANEOUS | Status: DC

## 2012-06-04 MED ORDER — ONDANSETRON HCL 4 MG/2ML IJ SOLN
4.0000 mg | Freq: Four times a day (QID) | INTRAMUSCULAR | Status: DC | PRN
  Administered 2012-06-05: 4 mg via INTRAVENOUS
  Filled 2012-06-04: qty 2

## 2012-06-04 MED ORDER — TRAZODONE HCL 50 MG PO TABS
50.0000 mg | ORAL_TABLET | Freq: Every evening | ORAL | Status: DC | PRN
  Filled 2012-06-04: qty 1

## 2012-06-04 MED ORDER — LORAZEPAM BOLUS VIA INFUSION: 0.5000 mg | Freq: Three times a day (TID) | INTRAVENOUS | Status: DC | PRN

## 2012-06-04 MED ORDER — FLUOXETINE HCL 20 MG PO CAPS
20.0000 mg | ORAL_CAPSULE | Freq: Three times a day (TID) | ORAL | Status: DC
  Administered 2012-06-05 – 2012-06-07 (×7): 20 mg via ORAL
  Filled 2012-06-04 (×10): qty 1

## 2012-06-04 MED ORDER — BISMUTH SUBSALICYLATE 262 MG/15ML PO SUSP
30.0000 mL | Freq: Three times a day (TID) | ORAL | Status: DC | PRN
  Filled 2012-06-04: qty 236

## 2012-06-04 MED ORDER — METOPROLOL TARTRATE 12.5 MG HALF TABLET
12.5000 mg | ORAL_TABLET | Freq: Two times a day (BID) | ORAL | Status: DC | PRN
  Filled 2012-06-04: qty 1

## 2012-06-04 MED ORDER — LORAZEPAM 2 MG/ML IJ SOLN: 0.5000 mg | Freq: Three times a day (TID) | INTRAMUSCULAR | Status: DC | PRN

## 2012-06-04 MED ORDER — LIP MEDEX EX OINT
1.0000 "application " | TOPICAL_OINTMENT | Freq: Two times a day (BID) | CUTANEOUS | Status: DC
  Administered 2012-06-04 – 2012-06-07 (×6): 1 via TOPICAL
  Filled 2012-06-04 (×2): qty 7

## 2012-06-04 MED ORDER — MAGIC MOUTHWASH
15.0000 mL | Freq: Four times a day (QID) | ORAL | Status: DC | PRN
  Filled 2012-06-04: qty 15

## 2012-06-04 MED ORDER — INSULIN ASPART 100 UNIT/ML ~~LOC~~ SOLN
0.0000 [IU] | Freq: Three times a day (TID) | SUBCUTANEOUS | Status: DC
  Administered 2012-06-05: 3 [IU] via SUBCUTANEOUS
  Administered 2012-06-06 (×2): 2 [IU] via SUBCUTANEOUS

## 2012-06-04 MED ORDER — HYDROMORPHONE HCL PF 1 MG/ML IJ SOLN
0.5000 mg | INTRAMUSCULAR | Status: DC | PRN
  Administered 2012-06-05: 1 mg via INTRAVENOUS
  Administered 2012-06-05: 2 mg via INTRAVENOUS
  Administered 2012-06-05: 1 mg via INTRAVENOUS
  Administered 2012-06-05 (×2): 2 mg via INTRAVENOUS
  Administered 2012-06-05 (×2): 1 mg via INTRAVENOUS
  Administered 2012-06-05 – 2012-06-06 (×2): 2 mg via INTRAVENOUS
  Filled 2012-06-04 (×4): qty 2
  Filled 2012-06-04: qty 1
  Filled 2012-06-04: qty 2

## 2012-06-04 MED ORDER — DIPHENHYDRAMINE HCL 12.5 MG/5ML PO ELIX: 12.5000 mg | ORAL_SOLUTION | Freq: Four times a day (QID) | ORAL | Status: DC | PRN

## 2012-06-04 MED ORDER — ASPIRIN 81 MG PO TABS: 81.0000 mg | ORAL_TABLET | Freq: Every day | ORAL | Status: DC

## 2012-06-04 MED ORDER — LOSARTAN POTASSIUM 25 MG PO TABS
25.0000 mg | ORAL_TABLET | Freq: Every day | ORAL | Status: DC
  Administered 2012-06-05: 25 mg via ORAL
  Filled 2012-06-04 (×2): qty 1

## 2012-06-04 MED ORDER — HYDROMORPHONE HCL PF 1 MG/ML IJ SOLN
1.0000 mg | Freq: Once | INTRAMUSCULAR | Status: AC
  Administered 2012-06-04: 1 mg via INTRAVENOUS

## 2012-06-04 MED ORDER — KETOROLAC TROMETHAMINE 30 MG/ML IJ SOLN
15.0000 mg | Freq: Four times a day (QID) | INTRAMUSCULAR | Status: DC | PRN
  Filled 2012-06-04: qty 2

## 2012-06-04 MED ORDER — BUPROPION HCL ER (XL) 300 MG PO TB24
300.0000 mg | ORAL_TABLET | Freq: Every day | ORAL | Status: DC
  Filled 2012-06-04 (×3): qty 1

## 2012-06-04 MED ORDER — SODIUM CHLORIDE 0.9 % IV SOLN
3.0000 g | Freq: Once | INTRAVENOUS | Status: AC
  Administered 2012-06-04: 3 g via INTRAVENOUS
  Filled 2012-06-04: qty 3

## 2012-06-04 MED ORDER — CHOLESTYRAMINE 4 G PO PACK
8.0000 g | PACK | Freq: Two times a day (BID) | ORAL | Status: DC
  Filled 2012-06-04 (×7): qty 2

## 2012-06-04 MED ORDER — ACETAMINOPHEN 650 MG RE SUPP: 650.0000 mg | Freq: Four times a day (QID) | RECTAL | Status: DC | PRN

## 2012-06-04 MED ORDER — HEPARIN SODIUM (PORCINE) 5000 UNIT/ML IJ SOLN
5000.0000 [IU] | Freq: Three times a day (TID) | INTRAMUSCULAR | Status: DC
  Administered 2012-06-05 – 2012-06-07 (×7): 5000 [IU] via SUBCUTANEOUS
  Filled 2012-06-04 (×10): qty 1

## 2012-06-04 MED ORDER — ONDANSETRON HCL 4 MG/2ML IJ SOLN
INTRAMUSCULAR | Status: AC
  Administered 2012-06-04: 4 mg
  Filled 2012-06-04: qty 2

## 2012-06-04 MED ORDER — BISACODYL 10 MG RE SUPP: 10.0000 mg | Freq: Two times a day (BID) | RECTAL | Status: DC | PRN

## 2012-06-04 MED ORDER — ALUM & MAG HYDROXIDE-SIMETH 200-200-20 MG/5ML PO SUSP: 30.0000 mL | Freq: Four times a day (QID) | ORAL | Status: DC | PRN

## 2012-06-04 MED ORDER — CHOLESTYRAMINE 4 GM/DOSE PO POWD: 8.0000 g | Freq: Two times a day (BID) | ORAL | Status: DC

## 2012-06-04 MED ORDER — PANTOPRAZOLE SODIUM 40 MG PO TBEC
80.0000 mg | DELAYED_RELEASE_TABLET | Freq: Every day | ORAL | Status: DC
  Administered 2012-06-05 – 2012-06-07 (×3): 80 mg via ORAL
  Filled 2012-06-04 (×2): qty 1
  Filled 2012-06-04 (×3): qty 2

## 2012-06-04 MED ORDER — PANTOPRAZOLE SODIUM 40 MG IV SOLR
40.0000 mg | Freq: Once | INTRAVENOUS | Status: AC
  Administered 2012-06-04: 40 mg via INTRAVENOUS
  Filled 2012-06-04: qty 40

## 2012-06-04 MED ORDER — LACTATED RINGERS IV SOLN
INTRAVENOUS | Status: DC
  Administered 2012-06-04: 23:00:00 via INTRAVENOUS
  Administered 2012-06-05: 75 mL/h via INTRAVENOUS

## 2012-06-04 MED ORDER — HEPARIN SODIUM (PORCINE) 5000 UNIT/ML IJ SOLN
5000.0000 [IU] | Freq: Three times a day (TID) | INTRAMUSCULAR | Status: DC
  Filled 2012-06-04 (×2): qty 1

## 2012-06-04 MED ORDER — SODIUM CHLORIDE 0.9 % IV SOLN
3.0000 g | Freq: Four times a day (QID) | INTRAVENOUS | Status: DC
  Administered 2012-06-05: 3 g via INTRAVENOUS
  Filled 2012-06-04 (×3): qty 3

## 2012-06-04 MED ORDER — ONDANSETRON 4 MG PO TBDP
ORAL_TABLET | ORAL | Status: AC
  Administered 2012-06-04: 4 mg
  Filled 2012-06-04: qty 1

## 2012-06-04 MED ORDER — ACETAMINOPHEN 325 MG PO TABS: 650.0000 mg | ORAL_TABLET | Freq: Four times a day (QID) | ORAL | Status: DC | PRN

## 2012-06-04 MED ORDER — ONDANSETRON HCL 4 MG/2ML IJ SOLN
4.0000 mg | Freq: Once | INTRAMUSCULAR | Status: AC
  Administered 2012-06-04: 4 mg via INTRAVENOUS
  Filled 2012-06-04: qty 2

## 2012-06-04 MED ORDER — PROMETHAZINE HCL 25 MG/ML IJ SOLN
12.5000 mg | Freq: Four times a day (QID) | INTRAMUSCULAR | Status: DC | PRN
  Filled 2012-06-04: qty 1

## 2012-06-04 MED ORDER — MILNACIPRAN HCL 100 MG PO TABS: 50.0000 mg | ORAL_TABLET | Freq: Two times a day (BID) | ORAL | Status: DC

## 2012-06-04 MED ORDER — CYCLOBENZAPRINE HCL 10 MG PO TABS
10.0000 mg | ORAL_TABLET | Freq: Two times a day (BID) | ORAL | Status: DC | PRN
  Filled 2012-06-04: qty 1

## 2012-06-04 MED ORDER — HYDROMORPHONE HCL PF 1 MG/ML IJ SOLN
1.0000 mg | Freq: Once | INTRAMUSCULAR | Status: AC
  Administered 2012-06-04: 1 mg via INTRAVENOUS
  Filled 2012-06-04: qty 1

## 2012-06-04 MED ORDER — ALBUTEROL SULFATE (5 MG/ML) 0.5% IN NEBU
2.5000 mg | INHALATION_SOLUTION | Freq: Four times a day (QID) | RESPIRATORY_TRACT | Status: DC | PRN
  Administered 2012-06-06: 2.5 mg via RESPIRATORY_TRACT
  Filled 2012-06-04: qty 0.5

## 2012-06-04 MED ORDER — OXYCODONE HCL 5 MG PO TABS
5.0000 mg | ORAL_TABLET | ORAL | Status: DC | PRN
  Administered 2012-06-05 – 2012-06-06 (×3): 10 mg via ORAL
  Filled 2012-06-04 (×3): qty 2

## 2012-06-04 MED ORDER — HYDROMORPHONE HCL PF 1 MG/ML IJ SOLN
0.5000 mg | INTRAMUSCULAR | Status: DC | PRN
  Filled 2012-06-04: qty 2
  Filled 2012-06-04: qty 1
  Filled 2012-06-04: qty 2

## 2012-06-04 MED ORDER — MILNACIPRAN HCL 50 MG PO TABS
50.0000 mg | ORAL_TABLET | Freq: Two times a day (BID) | ORAL | Status: DC
  Administered 2012-06-07: 50 mg via ORAL
  Filled 2012-06-04 (×8): qty 1

## 2012-06-04 MED ORDER — LACTATED RINGERS IV BOLUS (SEPSIS)
1000.0000 mL | Freq: Three times a day (TID) | INTRAVENOUS | Status: AC | PRN
  Administered 2012-06-06: 1000 mL via INTRAVENOUS

## 2012-06-04 MED ORDER — PSYLLIUM 95 % PO PACK
1.0000 | PACK | Freq: Two times a day (BID) | ORAL | Status: DC
  Administered 2012-06-05 – 2012-06-07 (×3): 1 via ORAL
  Filled 2012-06-04 (×7): qty 1

## 2012-06-04 MED ORDER — DIPHENHYDRAMINE HCL 50 MG/ML IJ SOLN: 12.5000 mg | Freq: Four times a day (QID) | INTRAMUSCULAR | Status: DC | PRN

## 2012-06-04 MED ORDER — SACCHAROMYCES BOULARDII 250 MG PO CAPS
250.0000 mg | ORAL_CAPSULE | Freq: Two times a day (BID) | ORAL | Status: DC
  Administered 2012-06-05 – 2012-06-07 (×5): 250 mg via ORAL
  Filled 2012-06-04 (×7): qty 1

## 2012-06-04 MED ORDER — ASPIRIN 81 MG PO CHEW
81.0000 mg | CHEWABLE_TABLET | Freq: Every day | ORAL | Status: DC
  Administered 2012-06-06 – 2012-06-07 (×2): 81 mg via ORAL
  Filled 2012-06-04 (×3): qty 1

## 2012-06-04 MED ORDER — SODIUM CHLORIDE 0.9 % IV BOLUS (SEPSIS)
1000.0000 mL | Freq: Once | INTRAVENOUS | Status: AC
  Administered 2012-06-04: 1000 mL via INTRAVENOUS

## 2012-06-04 NOTE — ED Provider Notes (Signed)
History     CSN: [id number]  Arrival date & time 06/04/12  1255   First MD Initiated Contact with Patient 06/04/12 1505      Chief Complaint  Patient presents with  . Chest Pain    (Consider location/radiation/quality/duration/timing/severity/associated sxs/prior treatment) Patient is a 61 y.o. female presenting with vomiting. The history is provided by the patient (pt complains of nauseau and vomiting and diarhea). No language interpreter was used.  Emesis  This is a new problem. The current episode started yesterday. The problem occurs 5 to 10 times per day. The problem has not changed since onset.Associated symptoms include abdominal pain and diarrhea. Pertinent negatives include no cough and no headaches.    Past Medical History  Diagnosis Date  . ARTHRITIS 10/25/2007  . BREAST CANCER, HX OF 08/03/2006  . DEPRESSION 08/03/2006  . DIABETES MELLITUS, TYPE II 08/03/2006  . DYSPNEA ON EXERTION 11/21/2009  . EPISTAXIS, RECURRENT 01/27/2008  . FIBROMYALGIA 01/13/2008  . GERD 04/30/2008  . HEPATITIS C 08/03/2006  . HIP PAIN, RIGHT 04/30/2007  . HYPERTENSION 04/30/2008  . INSOMNIA-SLEEP DISORDER-UNSPEC 11/21/2009  . KNEE SPRAIN, ACUTE 01/27/2008  . LEG PAIN, LEFT 01/13/2008  . OSTEOPENIA 07/26/2007  . Persistent vomiting 08/17/2008    Past Surgical History  Procedure Date  . Breast lumpectomy 2000    right with axillary exploration  . Mastectomy 2007  . Tonsillectomy   . S/p right broken leg with surgury as teen   . Tubal ligation   . S/p thyroid fna neg.     Family History  Problem Relation Age of Onset  . Heart disease Mother     History  Substance Use Topics  . Smoking status: Current Every Day Smoker  . Smokeless tobacco: Not on file     Comment: 2-4 ppd but quit in 2007  . Alcohol Use: Yes     Comment: rare    OB History    Grav Para Term Preterm Abortions TAB SAB Ect Mult Living                  Review of Systems  Constitutional: Negative for fatigue.  HENT:  Negative for congestion, sinus pressure and ear discharge.   Eyes: Negative for discharge.  Respiratory: Negative for cough.   Cardiovascular: Negative for chest pain.  Gastrointestinal: Positive for vomiting, abdominal pain and diarrhea.  Genitourinary: Negative for frequency and hematuria.  Musculoskeletal: Negative for back pain.  Skin: Negative for rash.  Neurological: Negative for seizures and headaches.  Hematological: Negative.   Psychiatric/Behavioral: Negative for hallucinations.    Allergies  Iohexol; Venlafaxine; and Tape  Home Medications   Current Outpatient Rx  Name  Route  Sig  Dispense  Refill  . FLUOXETINE HCL 20 MG PO CAPS   Oral   Take 20 mg by mouth 3 (three) times daily.           . ALENDRONATE SODIUM 10 MG PO TABS   Oral   Take 10 mg by mouth daily. Take with a full glass of water on an empty stomach.          . ASPIRIN 81 MG PO TABS   Oral   Take 81 mg by mouth daily.          . BUPROPION HCL ER (XL) 300 MG PO TB24   Oral   Take 300 mg by mouth every morning.           . CHOLESTYRAMINE 4 GM/DOSE PO POWD  Oral   Take 4 g by mouth 2 (two) times daily with a meal. 2 scoops          . CHOLESTYRAMINE 4 GM/DOSE PO POWD   Oral   Take 8 g by mouth 2 (two) times daily with a meal.           . CYCLOBENZAPRINE HCL 10 MG PO TABS   Oral   Take 1 tablet (10 mg total) by mouth 2 (two) times daily as needed.   180 tablet   3   . CYCLOBENZAPRINE HCL 10 MG PO TABS   Oral   Take 10 mg by mouth 2 (two) times daily as needed. For muscle spasms.          . DICLOFENAC SODIUM 75 MG PO TBEC   Oral   Take 75 mg by mouth 2 (two) times daily.           Marland Kitchen GLUCOSE BLOOD VI STRP      Use as directed once daily   100 each   5   . HYDROCODONE-ACETAMINOPHEN 5-500 MG PO TABS   Oral   Take 1 tablet by mouth every 6 (six) hours as needed. For recurrent back pain.         Marland Kitchen LANCETS MISC      Use as directed once daily          . LOSARTAN  POTASSIUM 25 MG PO TABS   Oral   Take 25 mg by mouth daily.           Marland Kitchen MILNACIPRAN HCL 100 MG PO TABS   Oral   Take 50 mg by mouth 2 (two) times daily.           Marland Kitchen OMEPRAZOLE 20 MG PO CPDR   Oral   Take 40 mg by mouth daily.           . TRAZODONE HCL 50 MG PO TABS   Oral   Take 50 mg by mouth at bedtime as needed. For sleep.           BP 131/66  Pulse 92  Temp 98.4 F (36.9 C) (Oral)  Resp 12  SpO2 97%  Physical Exam  Constitutional: She is oriented to person, place, and time. She appears well-developed.  HENT:  Head: Normocephalic and atraumatic.  Eyes: Conjunctivae normal and EOM are normal. No scleral icterus.  Neck: Neck supple. No thyromegaly present.  Cardiovascular: Normal rate and regular rhythm.  Exam reveals no gallop and no friction rub.   No murmur heard. Pulmonary/Chest: No stridor. She has no wheezes. She has no rales. She exhibits no tenderness.  Abdominal: She exhibits no distension. There is tenderness. There is no rebound.  Musculoskeletal: Normal range of motion. She exhibits no edema.  Lymphadenopathy:    She has no cervical adenopathy.  Neurological: She is oriented to person, place, and time. Coordination normal.  Skin: No rash noted. No erythema.  Psychiatric: She has a normal mood and affect. Her behavior is normal.    ED Course  Procedures (including critical care time)  Labs Reviewed  CBC - Abnormal; Notable for the following:    WBC 14.4 (*)     Hemoglobin 11.5 (*)     HCT 35.3 (*)     MCH 25.6 (*)     All other components within normal limits  BASIC METABOLIC PANEL - Abnormal; Notable for the following:    Sodium 131 (*)     Chloride  95 (*)     Glucose, Bld 131 (*)     All other components within normal limits  PRO B NATRIURETIC PEPTIDE - Abnormal; Notable for the following:    Pro B Natriuretic peptide (BNP) 159.9 (*)     All other components within normal limits  TROPONIN I  LIPASE, BLOOD  HEPATIC FUNCTION PANEL    No results found.   No diagnosis found.    MDM          Benny Lennert, MD 06/04/12 [phone]

## 2012-06-04 NOTE — ED Notes (Signed)
Patient c/o chest pressure, nausea, shortness of breath, and diarrhea since last night.

## 2012-06-04 NOTE — ED Notes (Signed)
MD at bedside. 

## 2012-06-04 NOTE — H&P (Signed)
[name]  1951/05/02 [id number]   This patient is a 61 y.o.female who presents today for surgical evaluation at the request of Dr. Myrlene Broker, [place].   Reason for evaluation: Probable cholecystitis.  Obese female with history of chronic diarrhea.  Has a hiatal hernia with reflux/GERD.  Controlled on proton pump inhibitor.  Some mild dyspnea on exertion which has been stable as well.  Noted sharp chest and upper abdominal pain yesterday.   Happened in the afternoon.  Worsened with nausea.  Has been vomiting constantly.  Came to the emergency room today.  There was concern of a possible myocardial infarction.  That has been ruled out.  Chest x-ray underwhelming as well.  Pain more focal in the right upper quadrant.  Exam and ultrasound suspicious for cholecystitis.  Surgical consultation requested.  Patient does have history of intermittent nausea and vomiting.  Likes to eat at [employer].  Does confess to having a heavy diet with fast food at times.  Denies any strong episodes of pain with nausea vomiting or bloating.  Has struggled with chronic diarrhea most of her life.  Currently on cholestyramine.  No personal nor family history of GI/colon cancer, inflammatory bowel disease, irritable bowel syndrome, allergy such as Celiac Sprue, dietary/dairy problems, colitis, ulcers nor gastritis.  No recent sick contacts/gastroenteritis.  No travel outside the country.  No changes in diet.  Has had colonoscopies in the distant past.  No polyps.  Diagnosis with hepatitis C and a liver biopsy.  No episodes of jaundice.  No severely dark urine nor light-colored stools    Past Medical History  Diagnosis Date  . ARTHRITIS 10/25/2007  . BREAST CANCER, HX OF 08/03/2006  . DEPRESSION 08/03/2006  . DIABETES MELLITUS, TYPE II 08/03/2006  . DYSPNEA ON EXERTION 11/21/2009  . EPISTAXIS, RECURRENT 01/27/2008  . FIBROMYALGIA 01/13/2008  . GERD 04/30/2008  . HEPATITIS C 08/03/2006  . HIP PAIN, RIGHT 04/30/2007   . HYPERTENSION 04/30/2008  . INSOMNIA-SLEEP DISORDER-UNSPEC 11/21/2009  . KNEE SPRAIN, ACUTE 01/27/2008  . LEG PAIN, LEFT 01/13/2008  . OSTEOPENIA 07/26/2007  . Persistent vomiting 08/17/2008    Past Surgical History  Procedure Date  . Breast lumpectomy 2000    right with axillary exploration  . Mastectomy 2007  . Tonsillectomy   . S/p right broken leg with surgury as teen   . Tubal ligation   . S/p thyroid fna neg.     History   Social History  . Marital Status: Divorced    Spouse Name: N/A    Number of Children: N/A  . Years of Education: N/A   Occupational History  . Med. Programmer, [employer]    Social History Main Topics  . Smoking status: Current Every Day Smoker  . Smokeless tobacco: Not on file     Comment: 2-4 ppd but quit in 2007  . Alcohol Use: Yes     Comment: rare  . Drug Use: No  . Sexually Active: Not on file   Other Topics Concern  . Not on file   Social History Narrative  . No narrative on file    Family History  Problem Relation Age of Onset  . Heart disease Mother     Current Facility-Administered Medications  Medication Dose Route Frequency Provider Last Rate Last Dose  . acetaminophen (TYLENOL) tablet 650 mg  650 mg Oral Q6H PRN Ardeth Sportsman, MD       Or  . acetaminophen (TYLENOL) suppository 650 mg  650 mg  Rectal Q6H PRN Ardeth Sportsman, MD      . albuterol (PROVENTIL) (5 MG/ML) 0.5% nebulizer solution 2.5 mg  2.5 mg Nebulization Q6H PRN Ardeth Sportsman, MD      . alum & mag hydroxide-simeth (MAALOX/MYLANTA) 200-200-20 MG/5ML suspension 30 mL  30 mL Oral Q6H PRN Ardeth Sportsman, MD      . Ampicillin-Sulbactam (UNASYN) 3 g in sodium chloride 0.9 % 100 mL IVPB  3 g Intravenous Once Doug Sou, MD 100 mL/hr at 06/04/12 2135 3 g at 06/04/12 2135  . Ampicillin-Sulbactam (UNASYN) 3 g in sodium chloride 0.9 % 100 mL IVPB  3 g Intravenous Q6H Ardeth Sportsman, MD      . aspirin tablet 81 mg  81 mg Oral Daily Ardeth Sportsman, MD      . bisacodyl (DULCOLAX)  suppository 10 mg  10 mg Rectal Q12H PRN Ardeth Sportsman, MD      . bismuth subsalicylate (PEPTO BISMOL) 262 MG/15ML suspension 30 mL  30 mL Oral Q8H PRN Ardeth Sportsman, MD      . buPROPion (WELLBUTRIN XL) 24 hr tablet 300 mg  300 mg Oral Lesle Reek Ardeth Sportsman, MD      . cholestyramine Lanetta Inch) powder 8 g  8 g Oral BID Ardeth Sportsman, MD      . cyclobenzaprine (FLEXERIL) tablet 10 mg  10 mg Oral BID PRN Ardeth Sportsman, MD      . diphenhydrAMINE (BENADRYL) injection 12.5-25 mg  12.5-25 mg Intravenous Q6H PRN Ardeth Sportsman, MD       Or  . diphenhydrAMINE (BENADRYL) 12.5 MG/5ML elixir 12.5-25 mg  12.5-25 mg Oral Q6H PRN Ardeth Sportsman, MD      . diphenhydrAMINE (BENADRYL) injection 12.5-25 mg  12.5-25 mg Intravenous Q6H PRN Ardeth Sportsman, MD      . FLUoxetine (PROZAC) capsule 20 mg  20 mg Oral TID Ardeth Sportsman, MD      . heparin injection 5,000 Units  5,000 Units Subcutaneous Q8H Ardeth Sportsman, MD      . HYDROmorphone (DILAUDID) injection 0.5-2 mg  0.5-2 mg Intravenous Q2H PRN Ardeth Sportsman, MD      . HYDROmorphone (DILAUDID) injection 0.5-2 mg  0.5-2 mg Intravenous Q30 min PRN Ardeth Sportsman, MD      . [address] HYDROmorphone (DILAUDID) injection 1 mg  1 mg Intravenous Once Benny Lennert, MD   1 mg at 06/04/12 1536  . [COMPLETED] HYDROmorphone (DILAUDID) injection 1 mg  1 mg Intravenous Once Doug Sou, MD   1 mg at 06/04/12 2134  . insulin aspart (novoLOG) injection 0-15 Units  0-15 Units Subcutaneous TID WC Ardeth Sportsman, MD      . insulin aspart (novoLOG) injection 0-5 Units  0-5 Units Subcutaneous QHS Ardeth Sportsman, MD      . ketorolac (TORADOL) 15 MG/ML injection 15-30 mg  15-30 mg Intravenous Q6H PRN Ardeth Sportsman, MD      . lactated ringers bolus 1,000 mL  1,000 mL Intravenous Q8H PRN Ardeth Sportsman, MD      . lactated ringers infusion   Intravenous Continuous Ardeth Sportsman, MD      . lip balm (CARMEX) ointment 1 application  1 application Topical BID  Ardeth Sportsman, MD      . LORazepam (ATIVAN) bolus via infusion 0.5-1 mg  0.5-1 mg Intravenous Q8H PRN Ardeth Sportsman, MD      . losartan (  COZAAR) tablet 25 mg  25 mg Oral Daily Ardeth Sportsman, MD      . magic mouthwash  15 mL Oral QID PRN Ardeth Sportsman, MD      . metoprolol tartrate (LOPRESSOR) tablet 12.5 mg  12.5 mg Oral Q12H PRN Ardeth Sportsman, MD      . Milnacipran HCl (SAVELLA) tablet 50 mg  50 mg Oral BID Ardeth Sportsman, MD      . [COMPLETED] ondansetron [facility]) 4 MG/2ML injection        4 mg at 06/04/12 1655  . [COMPLETED] ondansetron (ZOFRAN) injection 4 mg  4 mg Intravenous Once Benny Lennert, MD   4 mg at 06/04/12 1532  . [COMPLETED] ondansetron (ZOFRAN) injection 4 mg  4 mg Intravenous Once Doug Sou, MD   4 mg at 06/04/12 2134  . ondansetron (ZOFRAN) injection 4 mg  4 mg Intravenous Q6H PRN Ardeth Sportsman, MD      . [COMPLETED] ondansetron (ZOFRAN-ODT) 4 MG disintegrating tablet        4 mg at 06/04/12 1531  . oxyCODONE (Oxy IR/ROXICODONE) immediate release tablet 5-10 mg  5-10 mg Oral Q4H PRN Ardeth Sportsman, MD      . pantoprazole (PROTONIX) EC tablet 80 mg  80 mg Oral Daily Ardeth Sportsman, MD      . [COMPLETED] pantoprazole (PROTONIX) injection 40 mg  40 mg Intravenous Once Benny Lennert, MD   40 mg at 06/04/12 1655  . promethazine (PHENERGAN) injection 12.5-25 mg  12.5-25 mg Intravenous Q6H PRN Ardeth Sportsman, MD      . psyllium (HYDROCIL/METAMUCIL) packet 1 packet  1 packet Oral BID Ardeth Sportsman, MD      . saccharomyces boulardii (FLORASTOR) capsule 250 mg  250 mg Oral BID Ardeth Sportsman, MD      . [COMPLETED] sodium chloride 0.9 % bolus 1,000 mL  1,000 mL Intravenous Once Benny Lennert, MD   1,000 mL at 06/04/12 1653  . traZODone (DESYREL) tablet 50 mg  50 mg Oral QHS PRN Ardeth Sportsman, MD       Current Outpatient Prescriptions  Medication Sig Dispense Refill  . FLUoxetine (PROZAC) 20 MG capsule Take 20 mg by mouth 3 (three) times daily.        Marland Kitchen  alendronate (FOSAMAX) 10 MG tablet Take 10 mg by mouth daily. Take with a full glass of water on an empty stomach.       Marland Kitchen aspirin 81 MG tablet Take 81 mg by mouth daily.       Marland Kitchen buPROPion (WELLBUTRIN XL) 300 MG 24 hr tablet Take 300 mg by mouth every morning.        . cholestyramine (QUESTRAN) 4 GM/DOSE powder Take 8 g by mouth 2 (two) times daily with a meal.        . cyclobenzaprine (FLEXERIL) 10 MG tablet Take 1 tablet (10 mg total) by mouth 2 (two) times daily as needed.  180 tablet  3  . diclofenac (VOLTAREN) 75 MG EC tablet Take 75 mg by mouth 2 (two) times daily.        Marland Kitchen glucose blood (ONE TOUCH ULTRA TEST) test strip Use as directed once daily  100 each  5  . HYDROcodone-acetaminophen (VICODIN) 5-500 MG per tablet Take 1 tablet by mouth every 6 (six) hours as needed. For recurrent back pain.      . Lancets MISC Use as directed once daily       .  losartan (COZAAR) 25 MG tablet Take 25 mg by mouth daily.        . Milnacipran HCl 100 MG TABS Take 50 mg by mouth 2 (two) times daily.        Marland Kitchen omeprazole (PRILOSEC) 20 MG capsule Take 40 mg by mouth daily.        . traZODone (DESYREL) 50 MG tablet Take 50 mg by mouth at bedtime as needed. For sleep.      . [DISCONTINUED] cholestyramine (QUESTRAN) 4 GM/DOSE powder Take 4 g by mouth 2 (two) times daily with a meal. 2 scoops          Allergies  Allergen Reactions  . Iohexol      Desc: Pt has been pre medicated with prednisone before scans, pt is unsure of type of contrast she reacted to.   . Venlafaxine     REACTION: night sweats  . Tape Rash    "Tears my skin all up"    ROS: Constitutional:  No fevers, chills, sweats.  Weight stable Eyes:  No vision changes, No discharge HENT:  No sore throats, nasal drainage Lymph: No neck swelling, No bruising easily Pulmonary:  No cough, productive sputum CV: No orthopnea, PND  Patient walks 300 feet with difficulty.  DOE/SOB.  No exertional chest/neck/shoulder/arm pain. GI:  No personal nor  family history of GI/colon cancer, inflammatory bowel disease, irritable bowel syndrome, allergy such as Celiac Sprue, dietary/dairy problems, colitis, ulcers nor gastritis.  No recent sick contacts/gastroenteritis.  No travel outside the country.  No changes in diet. Renal: No UTIs, No hematuria Genital:  No drainage, bleeding, masses Musculoskeletal: No severe joint pain.  Good ROM major joints Skin:  No sores or lesions.  No rashes Heme/Lymph:  No easy bleeding.  No swollen lymph nodes Neuro: No focal weakness/numbness.  No seizures Psych: No suicidal ideation.  No hallucinations  BP 153/94  Pulse 100  Temp 99 F (37.2 C) (Oral)  Resp 20  SpO2 98%  Physical Exam: General: Pt awake/alert/oriented x4 in mild major acute distress.  Sister in room Eyes: PERRL, normal EOM. Sclera nonicteric Neuro: CN II-XII intact w/o focal sensory/motor deficits. Lymph: No head/neck/groin lymphadenopathy Psych:  No delerium/psychosis/paranoia.  Initially rather gruff/surly but then warmed up & pleasant at the end HENT: Normocephalic, Mucus membranes moist.  No thrush Neck: Supple, No tracheal deviation Chest: Mild equal bilateral lateral wall pain to palpation.  Not w cough.  Good respiratory excursion. CV:  Pulses intact.  Regular rhythm Abdomen: Soft, Obese.  Nondistended.  Very tender RUQ + Murphy's .  No incarcerated hernias. Ext:  SCDs BLE.  No significant edema.  No cyanosis Skin: No petechiae / purpurea.  No major sores Musculoskeletal: No severe joint pain.  Good ROM major joints   Results:   Labs: Results for orders placed during the hospital encounter of 06/04/12 (from the past 48 hour(s))  CBC     Status: Abnormal   Collection Time   06/04/12  2:35 PM      Component Value Range Comment   WBC 14.4 (*) 4.0 - 10.5 K/uL    RBC 4.50  3.87 - 5.11 MIL/uL    Hemoglobin 11.5 (*) 12.0 - 15.0 g/dL    HCT 69.6 (*) 29.5 - 46.0 %    MCV 78.4  78.0 - 100.0 fL    MCH 25.6 (*) 26.0 - 34.0 pg     MCHC 32.6  30.0 - 36.0 g/dL    RDW 28.4  13.2 -  15.5 %    Platelets 298  150 - 400 K/uL   BASIC METABOLIC PANEL     Status: Abnormal   Collection Time   06/04/12  2:35 PM      Component Value Range Comment   Sodium 131 (*) 135 - 145 mEq/L    Potassium 4.7  3.5 - 5.1 mEq/L    Chloride 95 (*) 96 - 112 mEq/L    CO2 22  19 - 32 mEq/L    Glucose, Bld 131 (*) 70 - 99 mg/dL    BUN 12  6 - 23 mg/dL    Creatinine, Ser 4.78  0.50 - 1.10 mg/dL    Calcium 9.8  8.4 - 29.5 mg/dL    GFR calc non Af Amer >90  >90 mL/min    GFR calc Af Amer >90  >90 mL/min   PRO B NATRIURETIC PEPTIDE     Status: Abnormal   Collection Time   06/04/12  2:35 PM      Component Value Range Comment   Pro B Natriuretic peptide (BNP) 159.9 (*) 0 - 125 pg/mL   TROPONIN I     Status: Normal   Collection Time   06/04/12  2:35 PM      Component Value Range Comment   Troponin I <0.30  <0.30 ng/mL   LIPASE, BLOOD     Status: Abnormal   Collection Time   06/04/12  2:35 PM      Component Value Range Comment   Lipase 62 (*) 11 - 59 U/L   HEPATIC FUNCTION PANEL     Status: Abnormal   Collection Time   06/04/12  2:35 PM      Component Value Range Comment   Total Protein 7.9  6.0 - 8.3 g/dL    Albumin 3.8  3.5 - 5.2 g/dL    AST 25  0 - 37 U/L    ALT 26  0 - 35 U/L    Alkaline Phosphatase 88  39 - 117 U/L    Total Bilirubin 0.4  0.3 - 1.2 mg/dL    Bilirubin, Direct 0.2  0.0 - 0.3 mg/dL    Indirect Bilirubin 0.2 (*) 0.3 - 0.9 mg/dL   POCT I-STAT TROPONIN I     Status: Normal   Collection Time   06/04/12  7:04 PM      Component Value Range Comment   Troponin i, poc 0.07  0.00 - 0.08 ng/mL    Comment 3              Imaging / Studies: Dg Chest 2 View  06/04/2012  *RADIOLOGY REPORT*  Clinical Data: Chest pain.  History of breast cancer. Hypertension.  CHEST - 2 VIEW  Comparison: 11/21/2009  Findings: Artifact overlies chest.  Heart size is normal.  Hiatal hernia again noted.  The lungs are clear.  The vascularity is normal.  No  effusions.  No significant bony finding.  IMPRESSION: No active cardiopulmonary disease.  Hiatal hernia.   Original Report Authenticated By: Paulina Fusi, M.D.    US Abdomen Complete  06/04/2012  *RADIOLOGY REPORT*  Clinical Data:   Right upper quadrant pain.  COMPLETE ABDOMINAL ULTRASOUND  Comparison:  MRI 06/02/2007.  Findings:  Gallbladder:  Two non mobile gallstones are noted within the neck of the gallbladder, 2 cm and 1.8 cm each.  Gallbladder wall is mildly thickened at 4 mm.  The patient was tender over the gallbladder during the study.  Common bile duct:  Normal caliber, 4 mm.  Liver:  No focal lesion identified.  Within normal limits in parenchymal echogenicity.  IVC:  Appears normal.  Pancreas:  No focal abnormality seen.  Spleen:  Within normal limits in size and echotexture.  Right Kidney:   Normal in size and parenchymal echogenicity.  No evidence of mass or hydronephrosis.  Left Kidney:  Normal in size and parenchymal echogenicity.  No evidence of mass or hydronephrosis.  Abdominal aorta:  No aneurysm identified.  IMPRESSION: Two gallstones are noted within the neck of the gallbladder, nonmobile.  Slight gallbladder wall thickening.  The patient was tender over the gallbladder during the study.  Cannot exclude early acute cholecystitis.   Original Report Authenticated By: Charlett Nose, M.D.     Medications / Allergies: per chart  Antibiotics: Anti-infectives     Start     Dose/Rate Route Frequency Ordered Stop   06/04/12 2230   Ampicillin-Sulbactam (UNASYN) 3 g in sodium chloride 0.9 % 100 mL IVPB        3 g 100 mL/hr over 60 Minutes Intravenous Every 6 hours 06/04/12 2219     06/04/12 2130   Ampicillin-Sulbactam (UNASYN) 3 g in sodium chloride 0.9 % 100 mL IVPB        3 g 100 mL/hr over 60 Minutes Intravenous  Once 06/04/12 2121            Assessment  [name]  61 y.o. female       Problem List:  Principal Problem:  *Acute cholecystitis with chronic  cholecystitis Active Problems:  HEPATITIS C  DIABETES MELLITUS, TYPE II  DEPRESSION  HYPERTENSION  GERD  FIBROMYALGIA  DYSPNEA ON EXERTION  Paraesophageal hiatal hernia - sliding  Chronic diarrhea  Poor venous access  Hemangioma of liver - left hepatic lobe   Nausea vomiting and right upper quadrant pain with physical exam showing Murphy's sign & ultrasound suspicious for cholecystitis  Plan:  -admit -IV ABx -IVF -pain control -cholecystectomy:  The anatomy & physiology of hepatobiliary & pancreatic function was discussed.  The pathophysiology of gallbladder dysfunction was discussed.  Natural history risks without surgery was discussed.   I feel the risks of no intervention will lead to serious problems that outweigh the operative risks; therefore, I recommended cholecystectomy to remove the pathology.  I explained laparoscopic techniques with possible need for an open approach.  Probable cholangiogram to evaluate the bilary tract was explained as well.    Risks such as bleeding, infection, abscess, leak, injury to other organs, need for further treatment, heart attack, death, and other risks were discussed.  I noted a good likelihood this will help address the problem.  Possibility that this will not correct all abdominal symptoms was explained.  Goals of post-operative recovery were discussed as well.  We will work to minimize complications.  Numerous questions were answered.  The patient & sister expressed understanding & wish to proceed with surgery.   -PICC line if IV fails -VTE prophylaxis- SCDs, etc -mobilize as tolerated to help recovery    Ardeth Sportsman, M.D., F.A.C.S. Gastrointestinal and Minimally Invasive Surgery [facility] [address] [place], [place] [address] [phone] Main / Paging [phone] Voice Mail   06/04/2012  CARE TEAM:  PCP: Oliver Barre, MD  Outpatient Care Team: Patient Care Team: Corwin Levins, MD as PCP - General  Inpatient Treatment Team: Treatment Team: Attending Provider: Doug Sou, MD; Technician: Merian Capron, EMT; Consulting Physician:  Md Ccs, MD; Registered Nurse: Harless Nakayama, RN

## 2012-06-04 NOTE — ED Provider Notes (Signed)
Patient developed epigastric pain yesterday morning followed by nausea and multiple episodes of diarrhea last night. She developed chest pressure anterior approach to 10 hours prior to coming here which was constant until arrival here. She is presently asymptomatic since treatment in the emergency department. Denies shortness of breath. Chest pain was nonexertional constant for 10 hours. Cardiac risk factors former smoker? family history,, she thinks both parents had a "cardiac issues" in her 90s or 74s although both parents lived to be in their 39s, diabetes On exam patient alert Glasgow Coma Score 15 no distress lungs clear auscultation heart regular rate and rhythm abdomen nondistended tender at right upper quadrant, obese extremities no edema  D no EKG for comparisonate: 06/04/2012 to compare Rate: 65  Rhythm: normal sinus rhythm  QRS Axis: normal  Intervals: normal  ST/T Wave abnormalities: normal  Conduction Disutrbances: none  Narrative Interpretation: unremarkable  No old tracing to compaire   Upon reexamination at 20 1:10 PM patient has severe right upper quadrant pain and not after she returned from ultrasound. Additional hydromorphone and Zofran ordered. Spoke with Dr. gross who will come to evaluate patient Medical decision making patient felt to have acute cholecystitis based on history, exam and laboratory data Results for orders placed during the hospital encounter of 06/04/12  CBC      Component Value Range   WBC 14.4 (*) 4.0 - 10.5 K/uL   RBC 4.50  3.87 - 5.11 MIL/uL   Hemoglobin 11.5 (*) 12.0 - 15.0 g/dL   HCT 16.1 (*) 09.6 - 04.5 %   MCV 78.4  78.0 - 100.0 fL   MCH 25.6 (*) 26.0 - 34.0 pg   MCHC 32.6  30.0 - 36.0 g/dL   RDW 40.9  81.1 - 91.4 %   Platelets 298  150 - 400 K/uL  BASIC METABOLIC PANEL      Component Value Range   Sodium 131 (*) 135 - 145 mEq/L   Potassium 4.7  3.5 - 5.1 mEq/L   Chloride 95 (*) 96 - 112 mEq/L   CO2 22  19 - 32 mEq/L   Glucose, Bld 131  (*) 70 - 99 mg/dL   BUN 12  6 - 23 mg/dL   Creatinine, Ser 7.82  0.50 - 1.10 mg/dL   Calcium 9.8  8.4 - 95.6 mg/dL   GFR calc non Af Amer >90  >90 mL/min   GFR calc Af Amer >90  >90 mL/min  PRO B NATRIURETIC PEPTIDE      Component Value Range   Pro B Natriuretic peptide (BNP) 159.9 (*) 0 - 125 pg/mL  TROPONIN I      Component Value Range   Troponin I <0.30  <0.30 ng/mL  LIPASE, BLOOD      Component Value Range   Lipase 62 (*) 11 - 59 U/L  HEPATIC FUNCTION PANEL      Component Value Range   Total Protein 7.9  6.0 - 8.3 g/dL   Albumin 3.8  3.5 - 5.2 g/dL   AST 25  0 - 37 U/L   ALT 26  0 - 35 U/L   Alkaline Phosphatase 88  39 - 117 U/L   Total Bilirubin 0.4  0.3 - 1.2 mg/dL   Bilirubin, Direct 0.2  0.0 - 0.3 mg/dL   Indirect Bilirubin 0.2 (*) 0.3 - 0.9 mg/dL  POCT I-STAT TROPONIN I      Component Value Range   Troponin i, poc 0.07  0.00 - 0.08 ng/mL  Comment 3            Dg Chest 2 View  06/04/2012  *RADIOLOGY REPORT*  Clinical Data: Chest pain.  History of breast cancer. Hypertension.  CHEST - 2 VIEW  Comparison: 11/21/2009  Findings: Artifact overlies chest.  Heart size is normal.  Hiatal hernia again noted.  The lungs are clear.  The vascularity is normal.  No effusions.  No significant bony finding.  IMPRESSION: No active cardiopulmonary disease.  Hiatal hernia.   Original Report Authenticated By: Paulina Fusi, M.D.    US Abdomen Complete  06/04/2012  *RADIOLOGY REPORT*  Clinical Data:   Right upper quadrant pain.  COMPLETE ABDOMINAL ULTRASOUND  Comparison:  MRI 06/02/2007.  Findings:  Gallbladder:  Two non mobile gallstones are noted within the neck of the gallbladder, 2 cm and 1.8 cm each.  Gallbladder wall is mildly thickened at 4 mm.  The patient was tender over the gallbladder during the study.  Common bile duct:   Normal caliber, 4 mm.  Liver:  No focal lesion identified.  Within normal limits in parenchymal echogenicity.  IVC:  Appears normal.  Pancreas:  No focal  abnormality seen.  Spleen:  Within normal limits in size and echotexture.  Right Kidney:   Normal in size and parenchymal echogenicity.  No evidence of mass or hydronephrosis.  Left Kidney:  Normal in size and parenchymal echogenicity.  No evidence of mass or hydronephrosis.  Abdominal aorta:  No aneurysm identified.  IMPRESSION: Two gallstones are noted within the neck of the gallbladder, nonmobile.  Slight gallbladder wall thickening.  The patient was tender over the gallbladder during the study.  Cannot exclude early acute cholecystitis.   Original Report Authenticated By: Charlett Nose, M.D.      Doug Sou, MD 06/05/12 0020

## 2012-06-04 NOTE — ED Notes (Signed)
IV unable to be established by nurse.  IV team notified.  They were unable to get IV either.  Dr. Ethelda Chick notified.

## 2012-06-04 NOTE — ED Notes (Signed)
IV team were able to get IV established.  Zofran 4mg  given IV. Per prior order as patient had vomited the po Zofran.

## 2012-06-04 NOTE — ED Notes (Signed)
Dr. Acquanetta Belling, surgeon at bedside.

## 2012-06-05 ENCOUNTER — Encounter (HOSPITAL_COMMUNITY): Admission: EM | Disposition: A | Discharge status: Home / Self Care | Payer: Self-pay | Source: Home / Self Care

## 2012-06-05 ENCOUNTER — Inpatient Hospital Stay (HOSPITAL_COMMUNITY): Payer: Self-pay | Admitting: Anesthesiology

## 2012-06-05 ENCOUNTER — Encounter (HOSPITAL_COMMUNITY): Payer: Self-pay | Admitting: Anesthesiology

## 2012-06-05 ENCOUNTER — Encounter (HOSPITAL_COMMUNITY): Payer: Self-pay | Admitting: General Surgery

## 2012-06-05 HISTORY — PX: LAPAROSCOPIC LYSIS OF ADHESIONS: SHX5905

## 2012-06-05 HISTORY — PX: CHOLECYSTECTOMY: SHX55

## 2012-06-05 LAB — GLUCOSE, CAPILLARY
Glucose-Capillary: 171 mg/dL — ABNORMAL HIGH (ref 70–99)
Glucose-Capillary: 157 mg/dL — ABNORMAL HIGH (ref 70–99)
Glucose-Capillary: 120 mg/dL — ABNORMAL HIGH (ref 70–99)

## 2012-06-05 LAB — HEMOGLOBIN A1C: Hgb A1c MFr Bld: 6.1 % — ABNORMAL HIGH (ref <5.7)

## 2012-06-05 SURGERY — LAPAROSCOPIC CHOLECYSTECTOMY
Anesthesia: General | Site: Abdomen | Wound class: Dirty or Infected

## 2012-06-05 MED ORDER — LACTATED RINGERS IV SOLN: INTRAVENOUS | Status: DC

## 2012-06-05 MED ORDER — FENTANYL CITRATE 0.05 MG/ML IJ SOLN
INTRAMUSCULAR | Status: DC | PRN
  Administered 2012-06-05 (×3): 50 ug via INTRAVENOUS
  Administered 2012-06-05: 100 ug via INTRAVENOUS

## 2012-06-05 MED ORDER — HYDROMORPHONE HCL PF 1 MG/ML IJ SOLN: 0.2500 mg | INTRAMUSCULAR | Status: DC | PRN

## 2012-06-05 MED ORDER — IOHEXOL 300 MG/ML  SOLN
INTRAMUSCULAR | Status: AC
  Filled 2012-06-05: qty 1

## 2012-06-05 MED ORDER — LACTATED RINGERS IV SOLN
INTRAVENOUS | Status: DC | PRN
  Administered 2012-06-05 (×2): via INTRAVENOUS

## 2012-06-05 MED ORDER — AMPICILLIN-SULBACTAM SODIUM 3 (2-1) G IJ SOLR
3.0000 g | Freq: Four times a day (QID) | INTRAMUSCULAR | Status: DC
  Administered 2012-06-05 – 2012-06-07 (×8): 3 g via INTRAVENOUS
  Administered 2012-06-07: 13:00:00 via INTRAVENOUS
  Filled 2012-06-05 (×10): qty 3

## 2012-06-05 MED ORDER — LACTATED RINGERS IR SOLN
Status: DC | PRN
  Administered 2012-06-05: 1

## 2012-06-05 MED ORDER — BUPIVACAINE-EPINEPHRINE 0.25% -1:200000 IJ SOLN
INTRAMUSCULAR | Status: AC
  Filled 2012-06-05: qty 1

## 2012-06-05 MED ORDER — GUAIFENESIN-DM 100-10 MG/5ML PO SYRP
10.0000 mL | ORAL_SOLUTION | ORAL | Status: DC | PRN
  Administered 2012-06-05 – 2012-06-06 (×3): 10 mL via ORAL
  Filled 2012-06-05 (×3): qty 10

## 2012-06-05 MED ORDER — MIDAZOLAM HCL 5 MG/5ML IJ SOLN
INTRAMUSCULAR | Status: DC | PRN
  Administered 2012-06-05: 2 mg via INTRAVENOUS

## 2012-06-05 MED ORDER — LOPERAMIDE HCL 2 MG PO CAPS: 2.0000 mg | ORAL_CAPSULE | Freq: Three times a day (TID) | ORAL | Status: DC | PRN

## 2012-06-05 MED ORDER — GLYCOPYRROLATE 0.2 MG/ML IJ SOLN
INTRAMUSCULAR | Status: DC | PRN
  Administered 2012-06-05: 0.6 mg via INTRAVENOUS

## 2012-06-05 MED ORDER — ACETAMINOPHEN 10 MG/ML IV SOLN
INTRAVENOUS | Status: DC | PRN
  Administered 2012-06-05: 1000 mg via INTRAVENOUS

## 2012-06-05 MED ORDER — AMOXICILLIN-POT CLAVULANATE 875-125 MG PO TABS: 1.0000 | ORAL_TABLET | Freq: Two times a day (BID) | ORAL | Status: DC

## 2012-06-05 MED ORDER — PROMETHAZINE HCL 25 MG/ML IJ SOLN: 6.2500 mg | INTRAMUSCULAR | Status: DC | PRN

## 2012-06-05 MED ORDER — ROCURONIUM BROMIDE 100 MG/10ML IV SOLN
INTRAVENOUS | Status: DC | PRN
  Administered 2012-06-05: 40 mg via INTRAVENOUS

## 2012-06-05 MED ORDER — BUPIVACAINE-EPINEPHRINE 0.25% -1:200000 IJ SOLN
INTRAMUSCULAR | Status: DC | PRN
  Administered 2012-06-05: 50 mL

## 2012-06-05 MED ORDER — LIDOCAINE HCL (CARDIAC) 20 MG/ML IV SOLN
INTRAVENOUS | Status: DC | PRN
  Administered 2012-06-05: 50 mg via INTRAVENOUS

## 2012-06-05 MED ORDER — PROPOFOL 10 MG/ML IV BOLUS
INTRAVENOUS | Status: DC | PRN
  Administered 2012-06-05: 160 mg via INTRAVENOUS

## 2012-06-05 MED ORDER — ACETAMINOPHEN 10 MG/ML IV SOLN
INTRAVENOUS | Status: AC
  Filled 2012-06-05: qty 100

## 2012-06-05 MED ORDER — ONDANSETRON HCL 4 MG/2ML IJ SOLN
INTRAMUSCULAR | Status: DC | PRN
  Administered 2012-06-05: 4 mg via INTRAVENOUS

## 2012-06-05 MED ORDER — OXYCODONE HCL 5 MG PO TABS: 5.0000 mg | ORAL_TABLET | ORAL | Status: DC | PRN

## 2012-06-05 MED ORDER — NEOSTIGMINE METHYLSULFATE 1 MG/ML IJ SOLN
INTRAMUSCULAR | Status: DC | PRN
  Administered 2012-06-05: 4 mg via INTRAVENOUS

## 2012-06-05 SURGICAL SUPPLY — 49 items
APPLIER CLIP 5 13 M/L LIGAMAX5 (MISCELLANEOUS) ×2
APPLIER CLIP ROT 10 11.4 M/L (STAPLE) ×2
APR CLP MED LRG 11.4X10 (STAPLE) ×1
APR CLP MED LRG 5 ANG JAW (MISCELLANEOUS) ×1
BAG SPEC RTRVL LRG 6X4 10 (ENDOMECHANICALS) ×1
CABLE HIGH FREQUENCY MONO STRZ (ELECTRODE) ×1 IMPLANT
CANISTER SUCTION 2500CC (MISCELLANEOUS) ×2 IMPLANT
CLIP APPLIE 5 13 M/L LIGAMAX5 (MISCELLANEOUS) IMPLANT
CLIP APPLIE ROT 10 11.4 M/L (STAPLE) IMPLANT
CLOTH BEACON ORANGE TIMEOUT ST (SAFETY) ×2 IMPLANT
COVER MAYO STAND STRL (DRAPES) ×2 IMPLANT
DECANTER SPIKE VIAL GLASS SM (MISCELLANEOUS) ×2 IMPLANT
DRAPE C-ARM 42X72 X-RAY (DRAPES) ×2 IMPLANT
DRAPE LAPAROSCOPIC ABDOMINAL (DRAPES) ×2 IMPLANT
DRAPE WARM FLUID 44X44 (DRAPE) ×2 IMPLANT
DRSG TEGADERM 2-3/8X2-3/4 SM (GAUZE/BANDAGES/DRESSINGS) ×5 IMPLANT
DRSG TEGADERM 4X4.75 (GAUZE/BANDAGES/DRESSINGS) ×3 IMPLANT
ELECT REM PT RETURN 9FT ADLT (ELECTROSURGICAL) ×2
ELECTRODE REM PT RTRN 9FT ADLT (ELECTROSURGICAL) ×1 IMPLANT
ENDOLOOP SUT PDS II  0 18 (SUTURE) ×1
ENDOLOOP SUT PDS II 0 18 (SUTURE) IMPLANT
GAUZE SPONGE 2X2 8PLY STRL LF (GAUZE/BANDAGES/DRESSINGS) IMPLANT
GLOVE ECLIPSE 8.0 STRL XLNG CF (GLOVE) ×2 IMPLANT
GLOVE INDICATOR 8.0 STRL GRN (GLOVE) ×2 IMPLANT
GOWN STRL NON-REIN LRG LVL3 (GOWN DISPOSABLE) ×2 IMPLANT
GOWN STRL REIN XL XLG (GOWN DISPOSABLE) ×4 IMPLANT
KIT BASIN OR (CUSTOM PROCEDURE TRAY) ×2 IMPLANT
NS IRRIG 1000ML POUR BTL (IV SOLUTION) ×2 IMPLANT
POUCH SPECIMEN RETRIEVAL 10MM (ENDOMECHANICALS) ×1 IMPLANT
SCALPEL HARMONIC ACE (MISCELLANEOUS) IMPLANT
SCISSORS LAP 5X35 DISP (ENDOMECHANICALS) ×1 IMPLANT
SET CHOLANGIOGRAPH MIX (MISCELLANEOUS) ×1 IMPLANT
SET IRRIG TUBING LAPAROSCOPIC (IRRIGATION / IRRIGATOR) ×1 IMPLANT
SLEEVE Z-THREAD 5X100MM (TROCAR) IMPLANT
SPONGE GAUZE 2X2 STER 10/PKG (GAUZE/BANDAGES/DRESSINGS) ×1
SPONGE GAUZE 4X4 12PLY (GAUZE/BANDAGES/DRESSINGS) ×1 IMPLANT
SUT MNCRL AB 4-0 PS2 18 (SUTURE) ×2 IMPLANT
SUT VICRYL 0 TIES 12 18 (SUTURE) IMPLANT
SUT VICRYL 0 UR6 27IN ABS (SUTURE) ×2 IMPLANT
SYR 20CC LL (SYRINGE) ×2 IMPLANT
TAPE UMBILICAL COTTON 1/8X30 (MISCELLANEOUS) ×1 IMPLANT
TOWEL OR 17X26 10 PK STRL BLUE (TOWEL DISPOSABLE) ×3 IMPLANT
TOWEL OR NON WOVEN STRL DISP B (DISPOSABLE) ×1 IMPLANT
TRAY LAP CHOLE (CUSTOM PROCEDURE TRAY) ×2 IMPLANT
TROCAR BLADELESS OPT 5 75 (ENDOMECHANICALS) ×2 IMPLANT
TROCAR CANNULA W/PORT DUAL 5MM (MISCELLANEOUS) IMPLANT
TROCAR Z-THREAD FIOS 11X100 BL (TROCAR) ×1 IMPLANT
TROCAR Z-THREAD FIOS 5X100MM (TROCAR) ×2 IMPLANT
TUBING INSUFFLATION 10FT LAP (TUBING) ×2 IMPLANT

## 2012-06-05 NOTE — Transfer of Care (Signed)
Immediate Anesthesia Transfer of Care Note  Patient: [name]  Procedure(s) Performed: Procedure(s) (LRB) with comments: LAPAROSCOPIC CHOLECYSTECTOMY (N/A) LAPAROSCOPIC LYSIS OF ADHESIONS (N/A)  Patient Location: PACU  Anesthesia Type:General  Level of Consciousness: awake, oriented and sedated  Airway & Oxygen Therapy: Patient Spontanous Breathing and Patient connected to face mask oxygen  Post-op Assessment: Report given to PACU RN and Post -op Vital signs reviewed and stable  Post vital signs: Reviewed and stable  Complications: No apparent anesthesia complications

## 2012-06-05 NOTE — Op Note (Addendum)
06/04/2012 - 06/05/2012  11:22 AM  PATIENT:  [name]  61 y.o. female  Patient Care Team: Corwin Levins, MD as PCP - General  PRE-OPERATIVE DIAGNOSIS:  Cholecystitis  POST-OPERATIVE DIAGNOSIS:    Acute on chronic Cholecystitis with gallstones Steatohepatitis but no frank cirrhosis.  Fitz-Hugh-Curtis Syndrome  PROCEDURE:  Procedure(s): LAPAROSCOPIC CHOLECYSTECTOMY LAPAROSCOPIC LYSIS OF ADHESIONS   SURGEON:  Surgeon(s): Ardeth Sportsman, MD Velora Heckler, MD - Assist  ANESTHESIA:   local and general  EBL:  Total I/O In: 1000 [I.V.:1000] Out: 50 [Urine:50]  Delay start of Pharmacological VTE agent (>24hrs) due to surgical blood loss or risk of bleeding:  no  DRAINS: none   SPECIMEN:  Source of Specimen:  Gallbladder w gallstones  DISPOSITION OF SPECIMEN:  PATHOLOGY  COUNTS:  YES  PLAN OF CARE: Admit to inpatient   PATIENT DISPOSITION:  PACU - hemodynamically stable.  INDICATION:   Obese female with severe RUQ/chest pain w Murphy's sign & evidence of cholecystitis on U/S.  MI & other cardiopulmonary etiologies ruled out.  I recommended cholecystectomy  The anatomy & physiology of hepatobiliary & pancreatic function was discussed.  The pathophysiology of gallbladder dysfunction was discussed.  Natural history risks without surgery was discussed.   I feel the risks of no intervention will lead to serious problems that outweigh the operative risks; therefore, I recommended cholecystectomy to remove the pathology.  I explained laparoscopic techniques with possible need for an open approach.  Probable cholangiogram to evaluate the bilary tract was explained as well.    Risks such as bleeding, infection, abscess, leak, injury to other organs, need for further treatment, heart attack, death, and other risks were discussed.  I noted a good likelihood this will help address the problem.  Possibility that this will not correct all abdominal symptoms was explained.  Goals of  post-operative recovery were discussed as well.  We will work to minimize complications.  An educational handout further explaining the pathology and treatment options was given as well.  Questions were answered.  The patient expresses understanding & wishes to proceed with surgery.    OR FINDINGS: Large inflamed gallbladder w early empyema.  2 very large GS.  Moderate adhesions on liver to diaphragm.    DESCRIPTION:   The patient was identified & brought into the operating room. The patient was positioned supine with arms tucked. SCDs were active during the entire case. The patient underwent general anesthesia without any difficulty.  The abdomen was prepped and draped in a sterile fashion. A Surgical Timeout confirmed our plan.  We positioned the patient in reverse [place] & right side up.  I placed a 5mm laparoscopic port through the abdominal wall using optical entry technique in the right upper quadrant.  Entry was clean.  We induced carbon dioxide insufflation. Camera inspection revealed no injury. There were no adhesions to the anterior abdominal wall supraumbilically.  I proceeded to continue with laparoscopic technique. I placed a #5 port in supraumbilical region, another 5mm port in the right flank near the anterior axillary line, and a 10mm port in the left subxiphoid region obliquely within the falciform ligament.  I turned attention to the right upper quadrant.  There was a large inflammed wad c/w cholecystitis.   I used careful blunt hydrodissection & hook cautery to free the omental, mesocolon & duodenum off the gallbladder.  The gallbladder was very distended w signs of ischemia/inflammation.  Its fundus was elevated cephalad. I freed the peritoneal coverings between  the gallbladder and the liver on the posteriolateral and anteriomedial walls.   I used careful blunt and hook dissection to help get a good critical view of the cystic artery and cystic duct. In elevating the  gallbladder, the gallbladder perforated with leaking of purulent bile.  I did further dissection to free a few centimeters of the  gallbladder off the liver bed to get a good critical view of the infundibulum and cystic duct. I mobilized the cystic artery; and, after getting a good 360 view, ligated the cystic artery using clips. I skeletonized the cystic duct.  The cystic duct was short & thickened.  I aborted the idea of a cholangiogram.   I placed clips on the cystic duct x4.  I completed cystic duct transection. I ligated the cystic duct stump further with a 0-PDS endoloop.  I freed the gallbladder from its remaining attachments to the liver. I ensured hemostasis on the gallbladder fossa of the liver and elsewhere. I inspected the rest of the abdomen & detected no injury nor bleeding elsewhere.  I placed the gallbladder in an endocatch bag.  She had moderate adhesions from the right hepatic lobe to the diaphragm, consistent w FHC syndrome.  Because of intermittent RUQ pain in the past h/o strong evidence of biliary colic until this episode, I transected them off using hook cautery.  I removed the gallbladder out the subxiphoid port.  I had to cut the fascia to 3cm to get it out.  I closed the subxiphoid fascia transversely using 0 Vicryl interrupted stitches.   I closed the skin using 4-0 monocryl stitch.  Umbilical tape wicks left in the subxiphoid wound.  Sterile dressings were applied. The patient was extubated & arrived in the PACU in stable condition.  I believe she will antibiotics for 5 days at least.  I had discussed postoperative care with the patient in detail last night.    I am about to locate the patient's family and discuss operative findings and postoperative goals / instructions.  Instructions are written in the chart as well.

## 2012-06-05 NOTE — Anesthesia Postprocedure Evaluation (Signed)
Anesthesia Post Note  Patient: [name]  Procedure(s) Performed: Procedure(s) (LRB): LAPAROSCOPIC CHOLECYSTECTOMY (N/A) LAPAROSCOPIC LYSIS OF ADHESIONS (N/A)  Anesthesia type: General  Patient location: PACU  Post pain: Pain level controlled  Post assessment: Post-op Vital signs reviewed  Last Vitals:  Filed Vitals:   06/05/12 1205  BP:   Pulse: 102  Temp:   Resp: 18    Post vital signs: Reviewed  Level of consciousness: sedated  Complications: No apparent anesthesia complications

## 2012-06-05 NOTE — Anesthesia Preprocedure Evaluation (Signed)
Anesthesia Evaluation  Patient identified by MRN, date of birth, ID band Patient awake    Reviewed: Allergy & Precautions, H&P , NPO status , Patient's Chart, lab work & pertinent test results  Airway Mallampati: II TM Distance: >3 FB Neck ROM: Full    Dental  (+) Teeth Intact and Dental Advisory Given   Pulmonary neg pulmonary ROS, shortness of breath and with exertion, former smoker (100 pak years),  breath sounds clear to auscultation  Pulmonary exam normal       Cardiovascular hypertension, Pt. on medications negative cardio ROS  Rhythm:Regular Rate:Normal     Neuro/Psych Depression  Neuromuscular disease    GI/Hepatic GERD-  ,(+) Hepatitis -, C  Endo/Other  negative endocrine ROSdiabetes, Type obesity  Renal/GU negative Renal ROS  negative genitourinary   Musculoskeletal  (+) Fibromyalgia -  Abdominal (+) + obese,   Peds  Hematology negative hematology ROS (+)   Anesthesia Other Findings History of right breast CA  Reproductive/Obstetrics negative OB ROS                           Anesthesia Physical Anesthesia Plan  ASA: III  Anesthesia Plan: General   Post-op Pain Management:    Induction: Intravenous  Airway Management Planned: Oral ETT  Additional Equipment:   Intra-op Plan:   Post-operative Plan: Extubation in OR  Informed Consent: I have reviewed the patients History and Physical, chart, labs and discussed the procedure including the risks, benefits and alternatives for the proposed anesthesia with the patient or authorized representative who has indicated his/her understanding and acceptance.   Dental advisory given  Plan Discussed with: CRNA  Anesthesia Plan Comments:         Anesthesia Quick Evaluation

## 2012-06-06 ENCOUNTER — Inpatient Hospital Stay (HOSPITAL_COMMUNITY): Payer: Self-pay

## 2012-06-06 DIAGNOSIS — R05 Cough: Secondary | ICD-10-CM | POA: Diagnosis not present

## 2012-06-06 LAB — GLUCOSE, CAPILLARY: Glucose-Capillary: 149 mg/dL — ABNORMAL HIGH (ref 70–99)

## 2012-06-06 MED ORDER — NAPROXEN 500 MG PO TABS
500.0000 mg | ORAL_TABLET | Freq: Two times a day (BID) | ORAL | Status: DC
  Administered 2012-06-06 – 2012-06-07 (×3): 500 mg via ORAL
  Filled 2012-06-06 (×4): qty 1

## 2012-06-06 MED ORDER — LACTATED RINGERS IV BOLUS (SEPSIS): 1000.0000 mL | Freq: Three times a day (TID) | INTRAVENOUS | Status: DC | PRN

## 2012-06-06 MED ORDER — MENTHOL 3 MG MT LOZG
1.0000 | LOZENGE | OROMUCOSAL | Status: DC | PRN
  Filled 2012-06-06: qty 9

## 2012-06-06 MED ORDER — LEVALBUTEROL HCL 1.25 MG/0.5ML IN NEBU
1.2500 mg | INHALATION_SOLUTION | Freq: Four times a day (QID) | RESPIRATORY_TRACT | Status: AC
  Administered 2012-06-06 (×2): 1.25 mg via RESPIRATORY_TRACT
  Filled 2012-06-06 (×4): qty 0.5

## 2012-06-06 MED ORDER — SODIUM CHLORIDE 0.9 % IJ SOLN: 3.0000 mL | INTRAMUSCULAR | Status: DC | PRN

## 2012-06-06 MED ORDER — ACETAMINOPHEN 500 MG PO TABS: 1000.0000 mg | ORAL_TABLET | Freq: Three times a day (TID) | ORAL | Status: DC

## 2012-06-06 MED ORDER — PHENOL 1.4 % MT LIQD
2.0000 | OROMUCOSAL | Status: DC | PRN
  Filled 2012-06-06: qty 177

## 2012-06-06 MED ORDER — ALBUTEROL SULFATE HFA 108 (90 BASE) MCG/ACT IN AERS
2.0000 | INHALATION_SPRAY | Freq: Three times a day (TID) | RESPIRATORY_TRACT | Status: DC
  Administered 2012-06-06: 2 via RESPIRATORY_TRACT
  Filled 2012-06-06: qty 6.7

## 2012-06-06 MED ORDER — ACETAMINOPHEN 325 MG PO TABS
325.0000 mg | ORAL_TABLET | Freq: Four times a day (QID) | ORAL | Status: DC | PRN
  Filled 2012-06-06: qty 2

## 2012-06-06 MED ORDER — IPRATROPIUM BROMIDE 0.02 % IN SOLN
0.5000 mg | Freq: Four times a day (QID) | RESPIRATORY_TRACT | Status: DC | PRN
Start: 2012-06-06 — End: 2012-06-07

## 2012-06-06 MED ORDER — OXYCODONE HCL 5 MG PO TABS
5.0000 mg | ORAL_TABLET | ORAL | Status: DC | PRN
  Administered 2012-06-06: 15 mg via ORAL
  Administered 2012-06-06: 10 mg via ORAL
  Administered 2012-06-06: 15 mg via ORAL
  Administered 2012-06-06: 5 mg via ORAL
  Administered 2012-06-07 (×4): 15 mg via ORAL
  Filled 2012-06-06 (×4): qty 3
  Filled 2012-06-06: qty 2
  Filled 2012-06-06 (×2): qty 3
  Filled 2012-06-06: qty 1

## 2012-06-06 MED ORDER — FUROSEMIDE 10 MG/ML IJ SOLN
40.0000 mg | Freq: Once | INTRAMUSCULAR | Status: AC
  Administered 2012-06-06: 40 mg via INTRAVENOUS
  Filled 2012-06-06: qty 4

## 2012-06-06 MED ORDER — HYDROMORPHONE HCL PF 1 MG/ML IJ SOLN
1.0000 mg | INTRAMUSCULAR | Status: DC | PRN
  Administered 2012-06-06 (×2): 1 mg via INTRAVENOUS
  Filled 2012-06-06: qty 1

## 2012-06-06 MED ORDER — SODIUM CHLORIDE 0.9 % IJ SOLN
3.0000 mL | Freq: Two times a day (BID) | INTRAMUSCULAR | Status: DC
  Administered 2012-06-06 – 2012-06-07 (×2): 3 mL via INTRAVENOUS

## 2012-06-06 MED ORDER — DM-GUAIFENESIN ER 30-600 MG PO TB12
1.0000 | ORAL_TABLET | Freq: Two times a day (BID) | ORAL | Status: DC
  Administered 2012-06-06 – 2012-06-07 (×3): 1 via ORAL
  Filled 2012-06-06 (×4): qty 1

## 2012-06-06 MED ORDER — ALBUTEROL SULFATE HFA 108 (90 BASE) MCG/ACT IN AERS
2.0000 | INHALATION_SPRAY | Freq: Three times a day (TID) | RESPIRATORY_TRACT | Status: DC
  Administered 2012-06-07: 2 via RESPIRATORY_TRACT
  Filled 2012-06-06: qty 6.7

## 2012-06-06 MED ORDER — ACETAMINOPHEN 650 MG RE SUPP: 650.0000 mg | Freq: Four times a day (QID) | RECTAL | Status: DC | PRN

## 2012-06-06 NOTE — Progress Notes (Signed)
SHIRELY TOREN [id number] [birth year]   Subjective:  Pt angry about pain - did not sleep well C/o severe constant dry cough Arguing with nurses at [facility] calling & complaining, threatening Dr. Gerrit Friends called last night  Frequently  Feels better now but still coughing several times a minute.  Deeper in chest - not the throat Neb helped No n/v/hb/reflux Using IS Dilaudid helping better now  Objective:  Vital signs:  Filed Vitals:   06/05/12 1643 06/05/12 2145 06/06/12 0201 06/06/12 0500  BP: 128/80 115/80 122/77 124/75  Pulse: 97 99 87 84  Temp: 98.7 F (37.1 C) 99.2 F (37.3 C) 98.5 F (36.9 C) 98.7 F (37.1 C)  TempSrc:  Oral Oral Oral  Resp: 17 18 18 18   Height:      Weight:      SpO2: 89% 95% 93% 92%       Intake/Output   Yesterday:  12/07 0701 - 12/08 0700 In: 3880 [P.O.:480; I.V.:3400] Out: 950 [Urine:950] This shift:     Bowel function:  Flatus: y  BM: n  Physical Exam:  General: Pt awake/alert/oriented x4 in no acute distress Eyes: PERRL, normal EOM.  Sclera clear.  No icterus Neuro: CN II-XII intact w/o focal sensory/motor deficits. Lymph: No head/neck/groin lymphadenopathy Psych:  No delerium/psychosis/paranoia.  Most pleasant to mow since i have met her.  Calm.  Appreciative HENT: Normocephalic, Mucus membranes moist.  No irritation/swelling.  No stridor.  No hoarseness.  No thrush Neck: Supple, No tracheal deviation Chest: No chest wall pain w good excursion.  CTA Bilat no wheezing CV:  Pulses intact.  Regular rhythm MS: Normal AROM mjr joints.  No obvious deformity Abdomen: Soft.  Nondistended.  Mildly tender at incisions only.  No incarcerated hernias. Ext:  SCDs BLE.  No mjr edema.  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 female  1 Day Post-Op  Procedure(s): LAPAROSCOPIC CHOLECYSTECTOMY LAPAROSCOPIC LYSIS OF ADHESIONS  Severe cough - no hypoxia/GERD/N/V  Acute cholecystitis  Plan:  -nebs x24hr -check CXR -IV to medlock.  Try diuresis -PPI -sched cough suppressant for now -stop A2Inh/cozaar w cough.  Follow BP -glc control OK -adv diet as tolerated -improve pain control -VTE prophylaxis- SCDs, etc -mobilize as tolerated to help recovery  I discussed the patient's status to her & the RN.  Questions were answered.  They expressed understanding & appreciation.   Ardeth Sportsman, M.D., F.A.C.S. Gastrointestinal and Minimally Invasive Surgery [facility] [address] [place], [place] [address] [phone] Main / Paging [phone] Voice Mail   06/06/2012  CARE TEAM:  PCP: Oliver Barre, MD  Outpatient Care Team: Patient Care Team: Corwin Levins, MD as PCP - General  Inpatient Treatment Team: Treatment Team: Attending Provider: Bishop Limbo, MD; Consulting Physician: Bishop Limbo, MD; Registered Nurse: Tristan Schroeder, RN; Registered Nurse: Patsey Berthold, RN; Registered Nurse: Leonie Douglas, RN; Respiratory Therapist: Renold Genta, RRT   Results:   Labs: Results for orders placed during the hospital encounter of 06/04/12 (from the past 48 hour(s))  CBC  Status: Abnormal   Collection Time   06/04/12  2:35 PM      Component Value Range Comment   WBC 14.4 (*) 4.0 - 10.5 K/uL    RBC 4.50  3.87 - 5.11 MIL/uL    Hemoglobin 11.5 (*) 12.0 - 15.0 g/dL    HCT 16.1 (*) 09.6 - 46.0 %    MCV 78.4  78.0 - 100.0 fL    MCH 25.6 (*) 26.0 - 34.0 pg    MCHC 32.6  30.0 - 36.0 g/dL    RDW 04.5  40.9 - 81.1 %    Platelets 298  150 - 400 K/uL   BASIC METABOLIC PANEL      Status: Abnormal   Collection Time   06/04/12  2:35 PM      Component Value Range Comment   Sodium 131 (*) 135 - 145 mEq/L    Potassium 4.7  3.5 - 5.1 mEq/L    Chloride 95 (*) 96 - 112 mEq/L    CO2 22  19 - 32 mEq/L    Glucose, Bld 131 (*) 70 - 99 mg/dL    BUN 12  6 - 23 mg/dL    Creatinine, Ser 9.14  0.50 - 1.10 mg/dL    Calcium 9.8  8.4 - 78.2 mg/dL    GFR calc non Af Amer >90  >90 mL/min    GFR calc Af Amer >90  >90 mL/min   PRO B NATRIURETIC PEPTIDE     Status: Abnormal   Collection Time   06/04/12  2:35 PM      Component Value Range Comment   Pro B Natriuretic peptide (BNP) 159.9 (*) 0 - 125 pg/mL   TROPONIN I     Status: Normal   Collection Time   06/04/12  2:35 PM      Component Value Range Comment   Troponin I <0.30  <0.30 ng/mL   LIPASE, BLOOD     Status: Abnormal   Collection Time   06/04/12  2:35 PM      Component Value Range Comment   Lipase 62 (*) 11 - 59 U/L   HEPATIC FUNCTION PANEL     Status: Abnormal   Collection Time   06/04/12  2:35 PM      Component Value Range Comment   Total Protein 7.9  6.0 - 8.3 g/dL    Albumin 3.8  3.5 - 5.2 g/dL    AST 25  0 - 37 U/L    ALT 26  0 - 35 U/L    Alkaline Phosphatase 88  39 - 117 U/L    Total Bilirubin 0.4  0.3 - 1.2 mg/dL    Bilirubin, Direct 0.2  0.0 - 0.3 mg/dL    Indirect Bilirubin 0.2 (*) 0.3 - 0.9 mg/dL   HEMOGLOBIN N5A     Status: Abnormal   Collection Time   06/04/12  2:35 PM      Component Value Range Comment   Hemoglobin A1C 6.1 (*) <5.7 %    Mean Plasma Glucose 128 (*) <117 mg/dL   POCT I-STAT TROPONIN I     Status: Normal   Collection Time   06/04/12  7:04 PM      Component Value Range Comment   Troponin i, poc 0.07  0.00 - 0.08 ng/mL    Comment 3            GLUCOSE, CAPILLARY     Status: Abnormal   Collection Time   06/04/12 11:15 PM  Component Value Range Comment   Glucose-Capillary 120 (*) 70 - 99 mg/dL   MRSA PCR SCREENING     Status: Normal   Collection Time   06/05/12 12:53 AM       Component Value Range Comment   MRSA by PCR NEGATIVE  NEGATIVE   GLUCOSE, CAPILLARY     Status: Abnormal   Collection Time   06/05/12  8:08 AM      Component Value Range Comment   Glucose-Capillary 145 (*) 70 - 99 mg/dL   GLUCOSE, CAPILLARY     Status: Abnormal   Collection Time   06/05/12 11:54 AM      Component Value Range Comment   Glucose-Capillary 171 (*) 70 - 99 mg/dL    Comment 1 Documented in Chart     GLUCOSE, CAPILLARY     Status: Abnormal   Collection Time   06/05/12  1:06 PM      Component Value Range Comment   Glucose-Capillary 157 (*) 70 - 99 mg/dL   GLUCOSE, CAPILLARY     Status: Abnormal   Collection Time   06/05/12  5:36 PM      Component Value Range Comment   Glucose-Capillary 120 (*) 70 - 99 mg/dL   GLUCOSE, CAPILLARY     Status: Abnormal   Collection Time   06/05/12 10:00 PM      Component Value Range Comment   Glucose-Capillary 149 (*) 70 - 99 mg/dL   GLUCOSE, CAPILLARY     Status: Abnormal   Collection Time   06/06/12  7:35 AM      Component Value Range Comment   Glucose-Capillary 144 (*) 70 - 99 mg/dL     Imaging / Studies: Dg Chest 2 View  06/04/2012  *RADIOLOGY REPORT*  Clinical Data: Chest pain.  History of breast cancer. Hypertension.  CHEST - 2 VIEW  Comparison: 11/21/2009  Findings: Artifact overlies chest.  Heart size is normal.  Hiatal hernia again noted.  The lungs are clear.  The vascularity is normal.  No effusions.  No significant bony finding.  IMPRESSION: No active cardiopulmonary disease.  Hiatal hernia.   Original Report Authenticated By: Paulina Fusi, M.D.    US Abdomen Complete  06/04/2012  *RADIOLOGY REPORT*  Clinical Data:   Right upper quadrant pain.  COMPLETE ABDOMINAL ULTRASOUND  Comparison:  MRI 06/02/2007.  Findings:  Gallbladder:  Two non mobile gallstones are noted within the neck of the gallbladder, 2 cm and 1.8 cm each.  Gallbladder wall is mildly thickened at 4 mm.  The patient was tender over the gallbladder during the study.   Common bile duct:   Normal caliber, 4 mm.  Liver:  No focal lesion identified.  Within normal limits in parenchymal echogenicity.  IVC:  Appears normal.  Pancreas:  No focal abnormality seen.  Spleen:  Within normal limits in size and echotexture.  Right Kidney:   Normal in size and parenchymal echogenicity.  No evidence of mass or hydronephrosis.  Left Kidney:  Normal in size and parenchymal echogenicity.  No evidence of mass or hydronephrosis.  Abdominal aorta:  No aneurysm identified.  IMPRESSION: Two gallstones are noted within the neck of the gallbladder, nonmobile.  Slight gallbladder wall thickening.  The patient was tender over the gallbladder during the study.  Cannot exclude early acute cholecystitis.   Original Report Authenticated By: Charlett Nose, M.D.     Medications / Allergies: per chart  Antibiotics: Anti-infectives     Start     Dose/Rate  Route Frequency Ordered Stop   06/05/12 1330   Ampicillin-Sulbactam (UNASYN) 3 g in sodium chloride 0.9 % 100 mL IVPB        3 g 100 mL/hr over 60 Minutes Intravenous Every 6 hours 06/05/12 1243 06/10/12 1329   06/05/12 0000   amoxicillin-clavulanate (AUGMENTIN) 875-125 MG per tablet        1 tablet Oral 2 times daily 06/05/12 1136     06/04/12 2300   Ampicillin-Sulbactam (UNASYN) 3 g in sodium chloride 0.9 % 100 mL IVPB  Status:  Discontinued        3 g 100 mL/hr over 60 Minutes Intravenous Every 6 hours 06/04/12 2219 06/05/12 1243   06/04/12 2130   Ampicillin-Sulbactam (UNASYN) 3 g in sodium chloride 0.9 % 100 mL IVPB        3 g 100 mL/hr over 60 Minutes Intravenous  Once 06/04/12 2121 06/04/12 2235

## 2012-06-07 ENCOUNTER — Encounter (HOSPITAL_COMMUNITY): Payer: Self-pay | Admitting: Surgery

## 2012-06-07 LAB — GLUCOSE, CAPILLARY: Glucose-Capillary: 112 mg/dL — ABNORMAL HIGH (ref 70–99)

## 2012-06-07 MED ORDER — OXYCODONE HCL 5 MG PO TABS
5.0000 mg | ORAL_TABLET | ORAL | Status: DC | PRN
Start: 2012-06-07 — End: 2013-03-04

## 2012-06-07 MED ORDER — POLYETHYLENE GLYCOL 3350 17 G PO PACK: 17.0000 g | PACK | Freq: Every day | ORAL | Status: DC

## 2012-06-07 MED ORDER — DSS 100 MG PO CAPS: 200.0000 mg | ORAL_CAPSULE | Freq: Two times a day (BID) | ORAL | Status: DC | PRN

## 2012-06-07 MED ORDER — POLYETHYLENE GLYCOL 3350 17 G PO PACK
17.0000 g | PACK | Freq: Every day | ORAL | Status: DC
  Administered 2012-06-07: 17 g via ORAL
  Filled 2012-06-07: qty 1

## 2012-06-07 MED ORDER — AMOXICILLIN-POT CLAVULANATE 875-125 MG PO TABS: 1.0000 | ORAL_TABLET | Freq: Two times a day (BID) | ORAL | Status: DC

## 2012-06-07 MED ORDER — DOCUSATE SODIUM 100 MG PO CAPS
200.0000 mg | ORAL_CAPSULE | Freq: Two times a day (BID) | ORAL | Status: DC | PRN
  Administered 2012-06-07 (×2): 200 mg via ORAL
  Filled 2012-06-07 (×2): qty 2

## 2012-06-07 NOTE — Discharge Summary (Signed)
Physician Discharge Summary  Patient ID: EBUNOLUWA GERNERT MRN: [id number] DOB/AGE: May 14, 1951 61 y.o.  Admit date: 06/04/2012 Discharge date: 06/07/2012  Admitting Diagnosis: Acute on chronic Cholecystitis with cholelithiasis   Discharge Diagnosis Acute on chronic Cholecystitis with cholelithiasis  Consultants None  Procedures Laparoscopic Cholecystectomy with [facility]  Hospital Course 61 yr old female who presented to [facility] with with abdominal pain with nausea and vomiting.  Workup showed acute on chronic cholecystitis with cholelithiasis.  Patient was admitted and underwent procedure listed above.  Tolerated procedure well and was transferred to the floor.  Diet was advanced as tolerated.  On POD#2, the patient was voiding well, tolerating diet, ambulating well, pain well controlled, vital signs stable, incisions c/d/i and felt stable for discharge home.  Patient will follow up in our office in 2 weeks and knows to call with questions or concerns.    Medication List     As of 06/07/2012  8:49 AM    TAKE these medications         alendronate 10 MG tablet   Commonly known as: FOSAMAX   Take 10 mg by mouth daily. Take with a full glass of water on an empty stomach.      amoxicillin-clavulanate 875-125 MG per tablet   Commonly known as: AUGMENTIN   Take 1 tablet by mouth 2 (two) times daily.      aspirin 81 MG tablet   Take 81 mg by mouth daily.      buPROPion 300 MG 24 hr tablet   Commonly known as: WELLBUTRIN XL   Take 300 mg by mouth every morning.      cholestyramine 4 GM/DOSE powder   Commonly known as: QUESTRAN   Take 8 g by mouth 2 (two) times daily with a meal.      cyclobenzaprine 10 MG tablet   Commonly known as: FLEXERIL   Take 1 tablet (10 mg total) by mouth 2 (two) times daily as needed.      diclofenac 75 MG EC tablet   Commonly known as: VOLTAREN   Take 75 mg by mouth 2 (two) times daily.      DSS 100 MG Caps   Take 200 mg by mouth 2 (two) times daily as  needed.      FLUoxetine 20 MG capsule   Commonly known as: PROZAC   Take 20 mg by mouth 3 (three) times daily.      glucose blood test strip   Use as directed once daily      HYDROcodone-acetaminophen 5-500 MG per tablet   Commonly known as: VICODIN   Take 1 tablet by mouth every 6 (six) hours as needed. For recurrent back pain.      Lancets Misc   Use as directed once daily      losartan 25 MG tablet   Commonly known as: COZAAR   Take 25 mg by mouth daily.      Milnacipran HCl 100 MG Tabs tablet   Commonly known as: SAVELLA   Take 50 mg by mouth 2 (two) times daily.      omeprazole 20 MG capsule   Commonly known as: PRILOSEC   Take 40 mg by mouth daily.      oxyCODONE 5 MG immediate release tablet   Commonly known as: Oxy IR/ROXICODONE   Take 1-2 tablets (5-10 mg total) by mouth every 4 (four) hours as needed for pain.      polyethylene glycol packet   Commonly known as: MIRALAX /  GLYCOLAX   Take 17 g by mouth daily.      traZODone 50 MG tablet   Commonly known as: DESYREL   Take 50 mg by mouth at bedtime as needed. For sleep.             Follow-up Information    Follow up with GROSS,STEVEN C., MD. Call in 2 weeks.   Contact information:   [address] [place] [address] [phone]          Signed: Denny Levy [facility] [phone]  06/07/2012, 8:49 AM

## 2012-06-07 NOTE — Progress Notes (Signed)
Patient ID: [name], female   DOB: 10-Nov-1950, 61 y.o.   MRN: [id number] 2 Days Post-Op  Subjective: Overall doing well but has not had a bowel movement since admission.  Feels bloated but denies nausea or vomiting.  Pain well controlled.  Objective: Vital signs in last 24 hours: Temp:  [98.2 F (36.8 C)-98.9 F (37.2 C)] 98.5 F (36.9 C) (12/09 0500) Pulse Rate:  [99-116] 99  (12/09 0500) Resp:  [18] 18  (12/09 0500) BP: (111-132)/(75-84) 132/84 mmHg (12/09 0500) SpO2:  [90 %-93 %] 90 % (12/09 0500) Last BM Date: 06/04/12  Intake/Output from previous day: 12/08 0701 - 12/09 0700 In: 1463 [P.O.:360; I.V.:903; IV Piggyback:200] Out: 2950 [Urine:2950] Intake/Output this shift: Total I/O In: -  Out: 400 [Urine:400]  PE: Abd: mildly distended, +BS, incisions c/d/i with wicks in place in the epigastric incision.    Lab Results:   Basename 06/04/12 1435  WBC 14.4*  HGB 11.5*  HCT 35.3*  PLT 298   BMET  Basename 06/04/12 1435  NA 131*  K 4.7  CL 95*  CO2 22  GLUCOSE 131*  BUN 12  CREATININE 0.54  CALCIUM 9.8   PT/INR No results found for this basename: LABPROT:2,INR:2 in the last 72 hours CMP     Component Value Date/Time   NA 131* 06/04/2012 1435   K 4.7 06/04/2012 1435   CL 95* 06/04/2012 1435   CO2 22 06/04/2012 1435   GLUCOSE 131* 06/04/2012 1435   GLUCOSE 108* 04/30/2006 1316   BUN 12 06/04/2012 1435   CREATININE 0.54 06/04/2012 1435   CALCIUM 9.8 06/04/2012 1435   PROT 7.9 06/04/2012 1435   ALBUMIN 3.8 06/04/2012 1435   AST 25 06/04/2012 1435   ALT 26 06/04/2012 1435   ALKPHOS 88 06/04/2012 1435   BILITOT 0.4 06/04/2012 1435   GFRNONAA >90 06/04/2012 1435   GFRAA >90 06/04/2012 1435   Lipase     Component Value Date/Time   LIPASE 62* 06/04/2012 1435       Studies/Results: Dg Chest 2 View  06/06/2012  *RADIOLOGY REPORT*  Clinical Data: Chest pain, cough  CHEST - 2 VIEW  Comparison: 06/04/2012  Findings: Cardiomediastinal silhouette is stable.  There  is elevation of the right hemidiaphragm.  Hiatal hernia is stable. Mild basilar atelectasis.  No pulmonary edema.  IMPRESSION: Mild basilar atelectasis.  Elevation of the right hemidiaphragm. No pulmonary edema.   Original Report Authenticated By: Natasha Mead, M.D.     Anti-infectives: Anti-infectives     Start     Dose/Rate Route Frequency Ordered Stop   06/05/12 1330  Ampicillin-Sulbactam (UNASYN) 3 g in sodium chloride 0.9 % 100 mL IVPB       3 g 100 mL/hr over 60 Minutes Intravenous Every 6 hours 06/05/12 1243 06/10/12 1329   06/05/12 0000  amoxicillin-clavulanate (AUGMENTIN) 875-125 MG per tablet       1 tablet Oral 2 times daily 06/05/12 1136     06/04/12 2300   Ampicillin-Sulbactam (UNASYN) 3 g in sodium chloride 0.9 % 100 mL IVPB  Status:  Discontinued        3 g 100 mL/hr over 60 Minutes Intravenous Every 6 hours 06/04/12 2219 06/05/12 1243   06/04/12 2130  Ampicillin-Sulbactam (UNASYN) 3 g in sodium chloride 0.9 % 100 mL IVPB       3 g 100 mL/hr over 60 Minutes Intravenous  Once 06/04/12 2121 06/04/12 2235           Assessment/Plan  1. POD#2-lap chole and LOA: overall doing well but has not had BM yet, will order colace and miralax, if able to have BM could go home later today, will need 5 more days of antibiotics.  Will do discharge orders and if able to have BM then can go home later today.  --colace and miralax  --home later today if BM on Augmentin  LOS: 3 days    WHITE, ELIZABETH 06/07/2012

## 2012-06-07 NOTE — Discharge Summary (Signed)
I agree with the assessment and outpatient discharge plans summarized by Clance Boll, PA.   Angelia Mould. Derrell Lolling, M.D., [facility] General and Minimally invasive Surgery Breast and Colorectal Surgery Office:   [phone] Pager:   [phone]

## 2012-06-07 NOTE — Care Management Note (Signed)
    Page 1 of 1   06/07/2012     2:56:02 PM   CARE MANAGEMENT NOTE 06/07/2012  Patient:  [name], [name]   Account Number:  [id number]  Date Initiated:  06/07/2012  Documentation initiated by:  Lorenda Ishihara  Subjective/Objective Assessment:   61 yo female admitted s/p lap chole for acute cholecystitis. PTA lived at home alone.     Action/Plan:   Home when stable   Anticipated DC Date:  06/07/2012   Anticipated DC Plan:  HOME/SELF CARE      DC Planning Services  CM consult      Choice offered to / List presented to:             Status of service:  Completed, signed off Medicare Important Message given?   (If response is "NO", the following Medicare IM given date fields will be blank) Date Medicare IM given:   Date Additional Medicare IM given:    Discharge Disposition:  HOME/SELF CARE  Per UR Regulation:  Reviewed for med. necessity/level of care/duration of stay  If discussed at Long Length of Stay Meetings, dates discussed:    Comments:

## 2012-06-07 NOTE — Progress Notes (Signed)
Patient interviewed and examined. Agree with the assessment and treatment plan outlined by Clance Boll, PA.  The patient is doing well. She may have a mild ileus. We will give her some MiraLAX and reassess this afternoon to see if she can go home. Otherwise she seems to be doing fine.   Angelia Mould. Derrell Lolling, M.D., [facility] General and Minimally invasive Surgery Breast and Colorectal Surgery Office:   [phone] Pager:   [phone]

## 2012-06-07 NOTE — Progress Notes (Signed)
Still in bed, ready for lunch, no BM so far.  She has not walked today; she is waiting on someone to walk her. O2 on she says she has low sats, not on anything at home.   Lungs:  Clear on auscultation [id number], + Bs, tolerating PO's well.  Taking oxycodone frequently for pain.  Plan:  Walk her after she eats, saline Lock IV, dulcolax later this afternon if she does not have a BM.  Ambulate with assistance in halls, check sats while walking.  Acute on chronic Cholecystitis with cholelithiasis Body mass index is 39.59 kg/(m^2). DEPRESSION  DIABETES MELLITUS, TYPE II  DYSPNEA ON EXERTION  GERD HEPATITIS C declined rx FIBROMYALGIA  INSOMNIA-SLEEP DISORDER-UNSPEC  HYPERTENSION  BREAST CANCER, HX OF /mastectomy 2000 stage 2b Arthritis

## 2012-06-07 NOTE — Progress Notes (Signed)
Agree with plans as outlined by Zola Button, PA.  Angelia Mould. Derrell Lolling, M.D., [facility] General and Minimally invasive Surgery Breast and Colorectal Surgery Office:   [phone] Pager:   [phone]

## 2012-06-09 ENCOUNTER — Telehealth (INDEPENDENT_AMBULATORY_CARE_PROVIDER_SITE_OTHER): Payer: Self-pay | Admitting: General Surgery

## 2012-06-09 NOTE — Telephone Encounter (Signed)
Message copied by Liliana Cline on Wed Jun 09, 2012  2:19 PM ------      Message from: Marin Shutter      Created: Wed Jun 09, 2012  2:07 PM      Regarding: Dr. Michaell Cowing      Contact: [phone]       Pt had lap chole sx on 06/05/12, and  would like a 'return to work' note.  She will pick it up on Friday.  She would like to return to work on 06/12/12.  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 I said you would call if it was a problem.thx

## 2012-06-09 NOTE — Telephone Encounter (Signed)
Note written and at the front for pick up.

## 2012-06-14 ENCOUNTER — Ambulatory Visit (INDEPENDENT_AMBULATORY_CARE_PROVIDER_SITE_OTHER): Payer: Self-pay | Admitting: Surgery

## 2012-06-14 ENCOUNTER — Encounter (INDEPENDENT_AMBULATORY_CARE_PROVIDER_SITE_OTHER): Payer: Self-pay | Admitting: Surgery

## 2012-06-14 VITALS — BP 124/90 | HR 113 | Temp 97.1°F | Ht 60.5 in | Wt 202.6 lb

## 2012-06-14 DIAGNOSIS — R197 Diarrhea, unspecified: Secondary | ICD-10-CM

## 2012-06-14 DIAGNOSIS — K812 Acute cholecystitis with chronic cholecystitis: Secondary | ICD-10-CM

## 2012-06-14 DIAGNOSIS — E669 Obesity, unspecified: Secondary | ICD-10-CM

## 2012-06-14 DIAGNOSIS — R0609 Other forms of dyspnea: Secondary | ICD-10-CM

## 2012-06-14 DIAGNOSIS — K529 Noninfective gastroenteritis and colitis, unspecified: Secondary | ICD-10-CM

## 2012-06-14 NOTE — Progress Notes (Signed)
Subjective:     Patient ID: [name], female   DOB: 09/10/1950, 61 y.o.   MRN: [id number]  HPI  [name]  [birth year] [id number]  Patient Care Team: Corwin Levins, MD as PCP - General  This patient is a 61 y.o.female who presents today for surgical evaluation s/p lap cholecystectomy 06/05/2012:   FINAL DIAGNOSIS Diagnosis Gallbladder - ACUTE CHOLECYSTITIS AND CHOLELITHIASIS. - ONE BENIGN LYMPH NODE, NEGATIVE FOR NEOPLASM (0/1). Abigail Miyamoto MD Pathologist, Electronic Signature (Case signed 06/08/2012)  The patient comes in today feeling better.  10 days out from emergent cholecystectomy.  No fevers or chills.  Still has some right-sided anterior chest wall soreness just below her old mastectomy site.  Feels like that started with the ultrasound in the ER.  Worse with twisting and coughing.  No productive sputum.  Energy level getting better.  Appetite better.  No nausea or vomiting.  Trying to switch to a more low-fat diet.  Struggled with a lot of loose stools and diarrhea.  Stop narcotics and stopped stool softeners.  Stopped MiraLAX.  It is calming down a little.  She still struggles with diarrhea times.  Cholestyramine has helped her in the past but do to cost issues she has not used it in a while.  Her Arava she started back to working at home.  Lost some weight but appetite coming back.  Overall feels like she is recovering well for only 10 days out.  Patient Active Problem List  Diagnosis  . HEPATITIS C  . DIABETES MELLITUS, TYPE II  . DEPRESSION  . HYPERTENSION  . GERD  . Persistent vomiting  . ARTHRITIS  . HIP PAIN, RIGHT  . FIBROMYALGIA  . LEG PAIN, LEFT  . OSTEOPENIA  . INSOMNIA-SLEEP DISORDER-UNSPEC  . EPISTAXIS, RECURRENT  . DYSPNEA ON EXERTION  . KNEE SPRAIN, ACUTE  . BREAST CANCER, HX OF  . Preventative health care  . History of detached retina repair  . Right hip pain  . Acute cholecystitis with chronic cholecystitis  . Paraesophageal hiatal  hernia - sliding  . Chronic diarrhea  . Poor venous access  . Hemangioma of liver - left hepatic lobe  . Dry cough  . Obesity (BMI 30-39.9)    Past Medical History  Diagnosis Date  . ARTHRITIS 10/25/2007  . BREAST CANCER, HX OF 08/03/2006  . DEPRESSION 08/03/2006  . DIABETES MELLITUS, TYPE II 08/03/2006  . DYSPNEA ON EXERTION 11/21/2009  . EPISTAXIS, RECURRENT 01/27/2008  . FIBROMYALGIA 01/13/2008  . GERD 04/30/2008  . HEPATITIS C 08/03/2006  . HIP PAIN, RIGHT 04/30/2007  . HYPERTENSION 04/30/2008  . INSOMNIA-SLEEP DISORDER-UNSPEC 11/21/2009  . KNEE SPRAIN, ACUTE 01/27/2008  . LEG PAIN, LEFT 01/13/2008  . OSTEOPENIA 07/26/2007  . Persistent vomiting 08/17/2008  . History of detached retina repair 02/12/2011  . Arthritis   . Blood transfusion without reported diagnosis   . Cancer     Past Surgical History  Procedure Date  . Breast lumpectomy 2000    right with axillary LND  . Mastectomy 2007    Right.  Dr. Jamey Ripa  . Tonsillectomy   . S/p right broken leg with surgury as teen   . Tubal ligation   . S/p thyroid fna neg.   . Cholecystectomy 06/05/2012    Procedure: LAPAROSCOPIC CHOLECYSTECTOMY;  Surgeon: Ardeth Sportsman, MD;  Location: [facility] ORS;  Service: General;  Laterality: N/A;  . Laparoscopic lysis of adhesions 06/05/2012    Procedure: LAPAROSCOPIC LYSIS OF ADHESIONS;  Surgeon: Ardeth Sportsman, MD;  Location: [facility] ORS;  Service: General;  Laterality: N/A;    History   Social History  . Marital Status: Divorced    Spouse Name: N/A    Number of Children: N/A  . Years of Education: N/A   Occupational History  . Medical Editor    Social History Main Topics  . Smoking status: Former Games developer  . Smokeless tobacco: Former Neurosurgeon    Quit date: 06/14/2006     Comment: 2-4 ppd but quit in 2007  . Alcohol Use: Yes     Comment: rare  . Drug Use: No  . Sexually Active: Not on file   Other Topics Concern  . Not on file   Social History Narrative  . No narrative on file    Family  History  Problem Relation Age of Onset  . Heart disease Mother   . Cancer Sister     breast  . Stroke Father     Current Outpatient Prescriptions  Medication Sig Dispense Refill  . alendronate (FOSAMAX) 10 MG tablet Take 10 mg by mouth daily. Take with a full glass of water on an empty stomach.       Marland Kitchen amoxicillin-clavulanate (AUGMENTIN) 875-125 MG per tablet Take 1 tablet by mouth 2 (two) times daily.  10 tablet  2  . aspirin 81 MG tablet Take 81 mg by mouth daily.       Marland Kitchen buPROPion (WELLBUTRIN XL) 300 MG 24 hr tablet Take 300 mg by mouth every morning.        . cholestyramine (QUESTRAN) 4 GM/DOSE powder Take 8 g by mouth 2 (two) times daily with a meal.        . cyclobenzaprine (FLEXERIL) 10 MG tablet Take 1 tablet (10 mg total) by mouth 2 (two) times daily as needed.  180 tablet  3  . diclofenac (VOLTAREN) 75 MG EC tablet Take 75 mg by mouth 2 (two) times daily.        Marland Kitchen docusate sodium 100 MG CAPS Take 200 mg by mouth 2 (two) times daily as needed.  10 capsule    . FLUoxetine (PROZAC) 20 MG capsule Take 20 mg by mouth 3 (three) times daily.        Marland Kitchen glucose blood (ONE TOUCH ULTRA TEST) test strip Use as directed once daily  100 each  5  . HYDROcodone-acetaminophen (VICODIN) 5-500 MG per tablet Take 1 tablet by mouth every 6 (six) hours as needed. For recurrent back pain.      . Lancets MISC Use as directed once daily       . losartan (COZAAR) 25 MG tablet Take 25 mg by mouth daily.        . Milnacipran HCl 100 MG TABS Take 50 mg by mouth 2 (two) times daily.        Marland Kitchen omeprazole (PRILOSEC) 20 MG capsule Take 40 mg by mouth daily.        Marland Kitchen oxyCODONE (OXY IR/ROXICODONE) 5 MG immediate release tablet Take 1-2 tablets (5-10 mg total) by mouth every 4 (four) hours as needed for pain.  50 tablet  0  . polyethylene glycol (MIRALAX / GLYCOLAX) packet Take 17 g by mouth daily.  14 each    . traZODone (DESYREL) 50 MG tablet Take 50 mg by mouth at bedtime as needed. For sleep.         Allergies   Allergen Reactions  . Iohexol      Desc: Pt  has been pre medicated with prednisone before scans, pt is unsure of type of contrast she reacted to.   . Venlafaxine     REACTION: night sweats  . Tape Rash    "Tears my skin all up"    BP 124/90  Pulse 113  Temp 97.1 F (36.2 C) (Oral)  Ht 5' 0.5" (1.537 m)  Wt 202 lb 9.6 oz (91.899 kg)  BMI 38.92 kg/m2  Dg Chest 2 View  06/06/2012  *RADIOLOGY REPORT*  Clinical Data: Chest pain, cough  CHEST - 2 VIEW  Comparison: 06/04/2012  Findings: Cardiomediastinal silhouette is stable.  There is elevation of the right hemidiaphragm.  Hiatal hernia is stable. Mild basilar atelectasis.  No pulmonary edema.  IMPRESSION: Mild basilar atelectasis.  Elevation of the right hemidiaphragm. No pulmonary edema.   Original Report Authenticated By: Natasha Mead, M.D.    Dg Chest 2 View  06/04/2012  *RADIOLOGY REPORT*  Clinical Data: Chest pain.  History of breast cancer. Hypertension.  CHEST - 2 VIEW  Comparison: 11/21/2009  Findings: Artifact overlies chest.  Heart size is normal.  Hiatal hernia again noted.  The lungs are clear.  The vascularity is normal.  No effusions.  No significant bony finding.  IMPRESSION: No active cardiopulmonary disease.  Hiatal hernia.   Original Report Authenticated By: Paulina Fusi, M.D.    US Abdomen Complete  06/04/2012  *RADIOLOGY REPORT*  Clinical Data:   Right upper quadrant pain.  COMPLETE ABDOMINAL ULTRASOUND  Comparison:  MRI 06/02/2007.  Findings:  Gallbladder:  Two non mobile gallstones are noted within the neck of the gallbladder, 2 cm and 1.8 cm each.  Gallbladder wall is mildly thickened at 4 mm.  The patient was tender over the gallbladder during the study.  Common bile duct:   Normal caliber, 4 mm.  Liver:  No focal lesion identified.  Within normal limits in parenchymal echogenicity.  IVC:  Appears normal.  Pancreas:  No focal abnormality seen.  Spleen:  Within normal limits in size and echotexture.  Right Kidney:   Normal in  size and parenchymal echogenicity.  No evidence of mass or hydronephrosis.  Left Kidney:  Normal in size and parenchymal echogenicity.  No evidence of mass or hydronephrosis.  Abdominal aorta:  No aneurysm identified.  IMPRESSION: Two gallstones are noted within the neck of the gallbladder, nonmobile.  Slight gallbladder wall thickening.  The patient was tender over the gallbladder during the study.  Cannot exclude early acute cholecystitis.   Original Report Authenticated By: Charlett Nose, M.D.      Review of Systems  Constitutional: Negative for fever, chills and diaphoresis.  HENT: Negative for ear pain, sore throat and trouble swallowing.   Eyes: Negative for photophobia and visual disturbance.  Respiratory: Negative for cough and choking.   Cardiovascular: Negative for chest pain and palpitations.  Gastrointestinal: Negative for nausea, vomiting, abdominal pain, diarrhea, constipation, anal bleeding and rectal pain.  Genitourinary: Negative for dysuria, frequency and difficulty urinating.  Musculoskeletal: Negative for myalgias and gait problem.  Skin: Negative for color change, pallor and rash.  Neurological: Negative for dizziness, speech difficulty, weakness and numbness.  Hematological: Negative for adenopathy.  Psychiatric/Behavioral: Negative for confusion and agitation. The patient is not nervous/anxious.        Objective:   Physical Exam  Constitutional: She is oriented to person, place, and time. She appears well-developed and well-nourished. No distress.  HENT:  Head: Normocephalic.  Mouth/Throat: Oropharynx is clear and moist. No oropharyngeal exudate.  Eyes:  Conjunctivae normal and EOM are normal. Pupils are equal, round, and reactive to light. No scleral icterus.  Neck: Normal range of motion. No tracheal deviation present.  Cardiovascular: Normal rate and intact distal pulses.   Pulmonary/Chest: Effort normal. No respiratory distress. She exhibits no tenderness.     Abdominal: Soft. She exhibits no distension. There is no rigidity, no guarding and negative Murphy's sign. No hernia. Hernia confirmed negative in the right inguinal area and confirmed negative in the left inguinal area.       Incisions clean with normal healing ridges.  No hernias  Genitourinary: No vaginal discharge found.  Musculoskeletal: Normal range of motion. She exhibits no tenderness.  Lymphadenopathy:       Right: No inguinal adenopathy present.       Left: No inguinal adenopathy present.  Neurological: She is alert and oriented to person, place, and time. No cranial nerve deficit. She exhibits normal muscle tone. Coordination normal.  Skin: Skin is warm and dry. No rash noted. She is not diaphoretic.  Psychiatric: She has a normal mood and affect. Her speech is normal and behavior is normal. Judgment and thought content normal. Her mood appears not anxious. Her affect is not angry, not blunt, not labile and not inappropriate. She is not agitated and not aggressive. Cognition and memory are normal.       Calm, smiling, pleasant       Assessment:     Recovering well from urgent cholecystectomy for acute on chronic cholecystitis.  Mild musculoskeletal strain gradually improving.    Plan:     Increase activity as tolerated to regular activity.  Do not push through pain.  Consider trial of heat and nonsteroidals for chest wall soreness.  It worsened, any chest x-ray or more aggressive evaluation.  Diet as tolerated. Bowel regimen to avoid problems.  Hopefully diarrhea will calm down over time.  Sometimes a mild fiber regimen plus or minus cholestyramine can help especially with history of chronic loose stools/diarrhea.  Return to clinic p.r.n.   Instructions discussed.  Followup with primary care physician for other health issues as would normally be done.  Questions answered.  The patient expressed understanding and appreciation

## 2012-06-14 NOTE — Patient Instructions (Addendum)
You may have Irritable Bowel Syndrome  Irritable Bowel Syndrome (IBS) is caused by a disturbance of normal bowel function. Other terms used are spastic colon, mucous colitis, and irritable colon. It does not require surgery, nor does it lead to cancer. There is no cure for IBS. But with proper diet, stress reduction, and medication, you will find that your problems (symptoms) will gradually disappear or improve. IBS is a common digestive disorder. It usually appears in late adolescence or early adulthood. Women develop it twice as often as men. CAUSES  After food has been digested and absorbed in the small intestine, waste material is moved into the colon (large intestine). In the colon, water and salts are absorbed from the undigested products coming from the small intestine. The remaining residue, or fecal material, is held for elimination. Under normal circumstances, gentle, rhythmic contractions on the bowel walls push the fecal material along the colon towards the rectum. In IBS, however, these contractions are irregular and poorly coordinated. The fecal material is either retained too long, resulting in constipation, or expelled too soon, producing diarrhea. SYMPTOMS  The most common symptom of IBS is pain. It is typically in the lower left side of the belly (abdomen). But it may occur anywhere in the abdomen. It can be felt as heartburn, backache, or even as a dull pain in the arms or shoulders. The pain comes from excessive bowel-muscle spasms and from the buildup of gas and fecal material in the colon. This pain:  Can range from sharp belly (abdominal) cramps to a dull, continuous ache.  Usually worsens soon after eating.  Is typically relieved by having a bowel movement or passing gas. Abdominal pain is usually accompanied by constipation. But it may also produce diarrhea. The diarrhea typically occurs right after a meal or upon arising in the morning. The stools are typically soft and watery.  They are often flecked with secretions (mucus). Other symptoms of IBS include:  Bloating.  Loss of appetite.  Heartburn.  Feeling sick to your stomach (nausea).  Belching  Vomiting  Gas. IBS may also cause a number of symptoms that are unrelated to the digestive system:  Fatigue.  Headaches.  Anxiety  Shortness of breath  Difficulty in concentrating.  Dizziness. These symptoms tend to come and go. DIAGNOSIS  The symptoms of IBS closely mimic the symptoms of other, more serious digestive disorders. So your caregiver may wish to perform a variety of additional tests to exclude these disorders. He/she wants to be certain of learning what is wrong (diagnosis). The nature and purpose of each test will be explained to you. TREATMENT A number of medications are available to help correct bowel function and/or relieve bowel spasms and abdominal pain. Among the drugs available are:  Mild, non-irritating laxatives for severe constipation and to help restore normal bowel habits.  Specific anti-diarrheal medications to treat severe or prolonged diarrhea.  Anti-spasmodic agents to relieve intestinal cramps.  Your caregiver may also decide to treat you with a mild tranquilizer or sedative during unusually stressful periods in your life. The important thing to remember is that if any drug is prescribed for you, make sure that you take it exactly as directed. Make sure that your caregiver knows how well it worked for you. HOME CARE INSTRUCTIONS   Avoid foods that are high in fat or oils. Some examples WUJ:WJXBJ cream, butter, frankfurters, sausage, and other fatty meats.  Avoid foods that have a laxative effect, such as fruit, fruit juice, and dairy  products.  Cut out carbonated drinks, chewing gum, and "gassy" foods, such as beans and cabbage. This may help relieve bloating and belching.  Bran taken with plenty of liquids may help relieve constipation.  Keep track of what foods  seem to trigger your symptoms.  Avoid emotionally charged situations or circumstances that produce anxiety.  Start or continue exercising.  Get plenty of rest and sleep. MAKE SURE YOU:   Understand these instructions.  Will watch your condition.  Will get help right away if you are not doing well or get worse. Document Released: 06/16/2005 Document Revised: 09/08/2011 Document Reviewed: 02/04/2008 [facility] Patient Information 2013 [place], [place].  Managing Pain  Pain after surgery or related to activity is often due to strain/injury to muscle, tendon, nerves and/or incisions.  This pain is usually short-term and will improve in a few months.   Many people find it helpful to do the following things TOGETHER to help speed the process of healing and to get back to regular activity more quickly:  1. Avoid heavy physical activity a.  no lifting greater than 20 pounds b. Do not "push through" the pain.  Listen to your body and avoid positions and maneuvers than reproduce the pain c. Walking is okay as tolerated, but go slowly and stop when getting sore.  d. Remember: If it hurts to do it, then don't do it! 2. Take Anti-inflammatory medication  a. Take with food/snack around the clock for 1-2 weeks i. This helps the muscle and nerve tissues become less irritable and calm down faster b. Choose ONE of the following over-the-counter medications: i. Naproxen 220mg  tabs (ex. Aleve) 1-2 pills twice a day  ii. Ibuprofen 200mg  tabs (ex. Advil, Motrin) 3-4 pills with every meal and just before bedtime iii. Acetaminophen 500mg  tabs (Tylenol) 1-2 pills with every meal and just before bedtime 3. Use a Heating pad or Ice/Cold Pack a. 4-6 times a day b. May use warm bath/hottub  or showers 4. Try Gentle Massage and/or Stretching  a. at the area of pain many times a day b. stop if you feel pain - do not overdo it  Try these steps together to help you body heal faster and avoid making things get  worse.  Doing just one of these things may not be enough.    If you are not getting better after two weeks or are noticing you are getting worse, contact our office for further advice; we may need to re-evaluate you & see what other things we can do to help.

## 2012-08-02 ENCOUNTER — Other Ambulatory Visit: Payer: Self-pay | Admitting: Internal Medicine

## 2013-01-10 ENCOUNTER — Telehealth: Payer: Self-pay

## 2013-01-10 ENCOUNTER — Other Ambulatory Visit: Payer: Self-pay | Admitting: Internal Medicine

## 2013-01-10 MED ORDER — FLUOXETINE HCL 20 MG PO CAPS: 20.0000 mg | ORAL_CAPSULE | Freq: Three times a day (TID) | ORAL | Status: DC

## 2013-01-10 NOTE — Telephone Encounter (Signed)
Ok this time only 

## 2013-01-10 NOTE — Telephone Encounter (Signed)
Patient informed. 

## 2013-01-10 NOTE — Telephone Encounter (Signed)
The patient has not been seen since 09/12/11.  She has schedule an appointment with PCP for September 2014.  Please advise if ok to fill #[phone] fluoxetine for this patient. [facility] [address]

## 2013-03-04 ENCOUNTER — Ambulatory Visit (INDEPENDENT_AMBULATORY_CARE_PROVIDER_SITE_OTHER): Payer: BC Managed Care – PPO | Admitting: Internal Medicine

## 2013-03-04 ENCOUNTER — Encounter: Payer: Self-pay | Admitting: Internal Medicine

## 2013-03-04 VITALS — BP 108/80 | HR 111 | Temp 99.2°F | Ht 62.0 in | Wt 212.2 lb

## 2013-03-04 DIAGNOSIS — IMO0001 Reserved for inherently not codable concepts without codable children: Secondary | ICD-10-CM

## 2013-03-04 DIAGNOSIS — R7302 Impaired glucose tolerance (oral): Secondary | ICD-10-CM

## 2013-03-04 DIAGNOSIS — G4700 Insomnia, unspecified: Secondary | ICD-10-CM

## 2013-03-04 DIAGNOSIS — R7309 Other abnormal glucose: Secondary | ICD-10-CM

## 2013-03-04 DIAGNOSIS — K529 Noninfective gastroenteritis and colitis, unspecified: Secondary | ICD-10-CM

## 2013-03-04 DIAGNOSIS — R197 Diarrhea, unspecified: Secondary | ICD-10-CM

## 2013-03-04 DIAGNOSIS — Z0000 Encounter for general adult medical examination without abnormal findings: Secondary | ICD-10-CM

## 2013-03-04 MED ORDER — ALENDRONATE SODIUM 10 MG PO TABS: 10.0000 mg | ORAL_TABLET | Freq: Every day | ORAL | Status: DC

## 2013-03-04 MED ORDER — GLUCOSE BLOOD VI STRP: ORAL_STRIP | Status: DC

## 2013-03-04 MED ORDER — CYCLOBENZAPRINE HCL 10 MG PO TABS: 10.0000 mg | ORAL_TABLET | Freq: Two times a day (BID) | ORAL | Status: DC | PRN

## 2013-03-04 MED ORDER — LOSARTAN POTASSIUM 25 MG PO TABS: 25.0000 mg | ORAL_TABLET | Freq: Every day | ORAL | Status: DC

## 2013-03-04 MED ORDER — MILNACIPRAN HCL 100 MG PO TABS: 50.0000 mg | ORAL_TABLET | Freq: Two times a day (BID) | ORAL | Status: DC

## 2013-03-04 MED ORDER — CHOLESTYRAMINE 4 GM/DOSE PO POWD: 4.0000 g | Freq: Two times a day (BID) | ORAL | Status: DC

## 2013-03-04 MED ORDER — TRAMADOL HCL 50 MG PO TABS: 50.0000 mg | ORAL_TABLET | Freq: Three times a day (TID) | ORAL | Status: DC | PRN

## 2013-03-04 MED ORDER — BUPROPION HCL ER (XL) 300 MG PO TB24: 300.0000 mg | ORAL_TABLET | ORAL | Status: DC

## 2013-03-04 MED ORDER — TEMAZEPAM 30 MG PO CAPS: 30.0000 mg | ORAL_CAPSULE | Freq: Every evening | ORAL | Status: DC | PRN

## 2013-03-04 MED ORDER — OMEPRAZOLE 20 MG PO CPDR: 40.0000 mg | DELAYED_RELEASE_CAPSULE | Freq: Every day | ORAL | Status: DC

## 2013-03-04 MED ORDER — FLUOXETINE HCL 20 MG PO CAPS: 20.0000 mg | ORAL_CAPSULE | Freq: Three times a day (TID) | ORAL | Status: DC

## 2013-03-04 NOTE — Assessment & Plan Note (Signed)
For refill cholestyramine

## 2013-03-04 NOTE — Patient Instructions (Signed)
Please return on a Thursday for a flu shot with a Nurse Visit  Please remember to followup with your GYN for the yearly pap smear Please take all new medication as prescribed Please continue all other medications as before, and refills have been done if requested Please go to the LAB in the Basement (turn left off the elevator) for the tests to be done today You will be contacted by phone if any changes need to be made immediately.  Otherwise, you will receive a letter about your results with an explanation, but please check with MyChart first.  Please remember to sign up for My Chart if you have not done so, as this will be important to you in the future with finding out test results, communicating by private email, and scheduling acute appointments online when needed.  Please return in 6 months, or sooner if needed

## 2013-03-04 NOTE — Assessment & Plan Note (Signed)
For tramadol prn,  to f/u any worsening symptoms or concerns ?

## 2013-03-04 NOTE — Assessment & Plan Note (Signed)

## 2013-03-04 NOTE — Assessment & Plan Note (Signed)
For temazepam prn,  to f/u any worsening symptoms or concerns

## 2013-03-04 NOTE — Progress Notes (Signed)
Subjective:    Patient ID: [name], female    DOB: Oct 30, 1950, 62 y.o.   MRN: [id number]  HPI  Here for wellness and f/u;  Overall doing ok;  Pt denies CP, worsening SOB, DOE, wheezing, orthopnea, PND, worsening LE edema, palpitations, dizziness or syncope.  Pt denies neurological change such as new headache, facial or extremity weakness.  Pt denies polydipsia, polyuria, or low sugar symptoms. Pt states overall good compliance with treatment and medications, good tolerability, and has been trying to follow lower cholesterol diet.  Pt denies worsening depressive symptoms, suicidal ideation or panic. No fever, night sweats, wt loss, loss of appetite, or other constitutional symptoms.  Pt states good ability with ADL's, has low fall risk, home safety reviewed and adequate, no other significant changes in hearing or vision, and not active with exercise, lives and works at home with 12 hr days, doesn't leave the house often.  Had been laid off, but now Now back to working with new ins today, actually has a full time and part time job, work at home on computer. S/p ccx, needs back on cholestyramine for ongoing diarrhea.  Also asks for pain control for ongoing FMS, savella worked nicely but cant afforcd, cant take tylenol a lot due to hep c, but would like flexeril ongoing for chronic rec back spasms and pain.  Nsaids not helping for overall body pain/polymyalgia and arthralgias unless she takes very high dose.  Does not want vicodin, has tried tramadol ok in the past.  Trazodone not working for sleep, does not want to try ambien due to risk of sleep walking.    Past Medical History  Diagnosis Date  . ARTHRITIS 10/25/2007  . BREAST CANCER, HX OF 08/03/2006  . DEPRESSION 08/03/2006  . DIABETES MELLITUS, TYPE II 08/03/2006  . DYSPNEA ON EXERTION 11/21/2009  . EPISTAXIS, RECURRENT 01/27/2008  . FIBROMYALGIA 01/13/2008  . GERD 04/30/2008  . HEPATITIS C 08/03/2006  . HIP PAIN, RIGHT 04/30/2007  . HYPERTENSION 04/30/2008   . INSOMNIA-SLEEP DISORDER-UNSPEC 11/21/2009  . KNEE SPRAIN, ACUTE 01/27/2008  . LEG PAIN, LEFT 01/13/2008  . OSTEOPENIA 07/26/2007  . Persistent vomiting 08/17/2008  . History of detached retina repair 02/12/2011  . Arthritis   . Blood transfusion without reported diagnosis   . Cancer    Past Surgical History  Procedure Laterality Date  . Breast lumpectomy  2000    right with axillary LND  . Mastectomy  2007    Right.  Dr. Jamey Ripa  . Tonsillectomy    . S/p right broken leg with surgury as teen    . Tubal ligation    . S/p thyroid fna neg.    . Cholecystectomy  06/05/2012    Procedure: LAPAROSCOPIC CHOLECYSTECTOMY;  Surgeon: Ardeth Sportsman, MD;  Location: [facility];  Service: General;  Laterality: N/A;  . Laparoscopic lysis of adhesions  06/05/2012    Procedure: LAPAROSCOPIC LYSIS OF ADHESIONS;  Surgeon: Ardeth Sportsman, MD;  Location: [facility];  Service: General;  Laterality: N/A;    reports that she has quit smoking. She quit smokeless tobacco use about 6 years ago. She reports that  drinks alcohol. She reports that she does not use illicit drugs. family history includes Cancer in her sister; Heart disease in her mother; Stroke in her father. Allergies  Allergen Reactions  . Ace Inhibitors Other (See Comments)    cough  . Iohexol      Desc: Pt has been pre medicated with prednisone before scans,  pt is unsure of type of contrast she reacted to.   . Venlafaxine     REACTION: night sweats  . Tape Rash    "Tears my skin all up"   Current Outpatient Prescriptions on File Prior to Visit  Medication Sig Dispense Refill  . aspirin 81 MG tablet Take 81 mg by mouth daily.        No current facility-administered medications on file prior to visit.    Review of Systems Constitutional: Negative for diaphoresis, activity change, appetite change or unexpected weight change.  HENT: Negative for hearing loss, ear pain, facial swelling, mouth sores and neck stiffness.   Eyes: Negative for pain,  redness and visual disturbance.  Respiratory: Negative for shortness of breath and wheezing.   Cardiovascular: Negative for chest pain and palpitations.  Gastrointestinal: Negative for diarrhea, blood in stool, abdominal distention or other pain Genitourinary: Negative for hematuria, flank pain or change in urine volume.  Musculoskeletal: Negative for myalgias and joint swelling.  Skin: Negative for color change and wound.  Neurological: Negative for syncope and numbness. other than noted Hematological: Negative for adenopathy.  Psychiatric/Behavioral: Negative for hallucinations, self-injury, decreased concentration and agitation.      Objective:   Physical Exam BP 108/80  Pulse 111  Temp(Src) 99.2 F (37.3 C) (Oral)  Ht 5\' 2"  (1.575 m)  Wt 212 lb 4 oz (96.276 kg)  BMI 38.81 kg/m2  SpO2 97% VS noted,  Constitutional: Pt is oriented to person, place, and time. Appears well-developed and well-nourished.  Head: Normocephalic and atraumatic.  Right Ear: External ear normal.  Left Ear: External ear normal.  Nose: Nose normal.  Mouth/Throat: Oropharynx is clear and moist.  Eyes: Conjunctivae and EOM are normal. Pupils are equal, round, and reactive to light.  Neck: Normal range of motion. Neck supple. No JVD present. No tracheal deviation present.  Cardiovascular: Normal rate, regular rhythm, normal heart sounds and intact distal pulses.   Pulmonary/Chest: Effort normal and breath sounds normal.  Abdominal: Soft. Bowel sounds are normal. There is no tenderness. No HSM  Musculoskeletal: Normal range of motion. Exhibits no edema.  Lymphadenopathy:  Has no cervical adenopathy.  Neurological: Pt is alert and oriented to person, place, and time. Pt has normal reflexes. No cranial nerve deficit.  Skin: Skin is warm and dry. No rash noted.  Psychiatric:  Has  normal mood and affect. Behavior is normal. 2+ nervous Diffuse myofascial pain to upper and lower back    Assessment & Plan:

## 2013-03-17 ENCOUNTER — Ambulatory Visit: Payer: BC Managed Care – PPO

## 2013-04-19 ENCOUNTER — Ambulatory Visit (INDEPENDENT_AMBULATORY_CARE_PROVIDER_SITE_OTHER): Payer: BC Managed Care – PPO | Admitting: Internal Medicine

## 2013-04-19 ENCOUNTER — Encounter: Payer: Self-pay | Admitting: Internal Medicine

## 2013-04-19 VITALS — BP 106/74 | HR 114 | Temp 97.7°F | Ht 61.0 in | Wt 207.0 lb

## 2013-04-19 DIAGNOSIS — IMO0001 Reserved for inherently not codable concepts without codable children: Secondary | ICD-10-CM

## 2013-04-19 DIAGNOSIS — M545 Low back pain, unspecified: Secondary | ICD-10-CM | POA: Insufficient documentation

## 2013-04-19 DIAGNOSIS — I10 Essential (primary) hypertension: Secondary | ICD-10-CM

## 2013-04-19 MED ORDER — PREDNISONE 10 MG PO TABS: ORAL_TABLET | ORAL | Status: DC

## 2013-04-19 MED ORDER — HYDROCODONE-ACETAMINOPHEN 5-325 MG PO TABS: 1.0000 | ORAL_TABLET | Freq: Four times a day (QID) | ORAL | Status: DC | PRN

## 2013-04-19 NOTE — Patient Instructions (Signed)
Please take all new medication as prescribed - the pain medication and prednisone Please continue all other medications as before, and refills have been done if requested. Please have the pharmacy call with any other refills you may need. Please let us know if you would like the savella if it is affordable  Please remember to sign up for My Chart if you have not done so, as this will be important to you in the future with finding out test results, communicating by private email, and scheduling acute appointments online when needed.

## 2013-04-19 NOTE — Assessment & Plan Note (Signed)
Ok for savella but wants to check on cost first

## 2013-04-19 NOTE — Assessment & Plan Note (Signed)
stable overall by history and exam, recent data reviewed with pt, and pt to continue medical treatment as before,  to f/u any worsening symptoms or concerns BP Readings from Last 3 Encounters:  04/19/13 106/74  03/04/13 108/80  06/14/12 124/90

## 2013-04-19 NOTE — Progress Notes (Signed)
Subjective:    Patient ID: [name], female    DOB: 01/21/1951, 62 y.o.   MRN: [id number]  HPI here with 3 wks onset right lower back pain, moderate,occas severe, better with some leftover hydrocodone, but pain has persisted instead of improving as in the past with some back strains, and also now with 1 wk RLE pain, weakness but no numbness, tramadol after the hydrocodone does not help; actually feels some better today as she can actually turn over in bed without excruciating pain last pm, hard to lose wt, cannot do exercise especially now. Pain is the worst with standing up, but also cant stand more than a few minutes, better to sit (mild pain only). Works at home, fulltime.  No falls, but fearful the right leg may giveaway No bowel or bladder change, fever, wt loss,  or falls.  No prior imaging including plain films recent. No prior surgury, though has hx of chronic recurring pain for yrs. Has hx of FMS as well, low grade active, is considering savella but wants to ask pharacist about cost first Past Medical History  Diagnosis Date  . ARTHRITIS 10/25/2007  . BREAST CANCER, HX OF 08/03/2006  . DEPRESSION 08/03/2006  . DIABETES MELLITUS, TYPE II 08/03/2006  . DYSPNEA ON EXERTION 11/21/2009  . EPISTAXIS, RECURRENT 01/27/2008  . FIBROMYALGIA 01/13/2008  . GERD 04/30/2008  . HEPATITIS C 08/03/2006  . HIP PAIN, RIGHT 04/30/2007  . HYPERTENSION 04/30/2008  . INSOMNIA-SLEEP DISORDER-UNSPEC 11/21/2009  . KNEE SPRAIN, ACUTE 01/27/2008  . LEG PAIN, LEFT 01/13/2008  . OSTEOPENIA 07/26/2007  . Persistent vomiting 08/17/2008  . History of detached retina repair 02/12/2011  . Arthritis   . Blood transfusion without reported diagnosis   . Cancer    Past Surgical History  Procedure Laterality Date  . Breast lumpectomy  2000    right with axillary LND  . Mastectomy  2007    Right.  Dr. Jamey Ripa  . Tonsillectomy    . S/p right broken leg with surgury as teen    . Tubal ligation    . S/p thyroid fna neg.    .  Cholecystectomy  06/05/2012    Procedure: LAPAROSCOPIC CHOLECYSTECTOMY;  Surgeon: Ardeth Sportsman, MD;  Location: [facility];  Service: General;  Laterality: N/A;  . Laparoscopic lysis of adhesions  06/05/2012    Procedure: LAPAROSCOPIC LYSIS OF ADHESIONS;  Surgeon: Ardeth Sportsman, MD;  Location: [facility];  Service: General;  Laterality: N/A;    reports that she has quit smoking. She quit smokeless tobacco use about 6 years ago. She reports that she drinks alcohol. She reports that she does not use illicit drugs. family history includes Cancer in her sister; Heart disease in her mother; Stroke in her father. Allergies  Allergen Reactions  . Ace Inhibitors Other (See Comments)    cough  . Iohexol      Desc: Pt has been pre medicated with prednisone before scans, pt is unsure of type of contrast she reacted to.   . Venlafaxine     REACTION: night sweats  . Tape Rash    "Tears my skin all up"   Current Outpatient Prescriptions on File Prior to Visit  Medication Sig Dispense Refill  . alendronate (FOSAMAX) 10 MG tablet Take 1 tablet (10 mg total) by mouth daily. Take with a full glass of water on an empty stomach.  12 tablet  3  . aspirin 81 MG tablet Take 81 mg by mouth daily.       Marland Kitchen  buPROPion (WELLBUTRIN XL) 300 MG 24 hr tablet Take 1 tablet (300 mg total) by mouth every morning.  90 tablet  3  . cholestyramine (QUESTRAN) 4 GM/DOSE powder Take 1 packet (4 g total) by mouth 2 (two) times daily with a meal.  378 g  11  . cyclobenzaprine (FLEXERIL) 10 MG tablet Take 1 tablet (10 mg total) by mouth 2 (two) times daily as needed.  180 tablet  3  . FLUoxetine (PROZAC) 20 MG capsule Take 1 capsule (20 mg total) by mouth 3 (three) times daily.  270 capsule  3  . glucose blood (ONE TOUCH ULTRA TEST) test strip Use as directed once daily  100 each  5  . losartan (COZAAR) 25 MG tablet Take 1 tablet (25 mg total) by mouth daily.  90 tablet  3  . omeprazole (PRILOSEC) 20 MG capsule Take 2 capsules (40 mg  total) by mouth daily.  180 capsule  3  . temazepam (RESTORIL) 30 MG capsule Take 1 capsule (30 mg total) by mouth at bedtime as needed for sleep.  90 capsule  1  . traMADol (ULTRAM) 50 MG tablet Take 1 tablet (50 mg total) by mouth every 8 (eight) hours as needed for pain.  90 tablet  5   No current facility-administered medications on file prior to visit.   Review of Systems  Constitutional: Negative for unexpected weight change, or unusual diaphoresis  HENT: Negative for tinnitus.   Eyes: Negative for photophobia and visual disturbance.  Respiratory: Negative for choking and stridor.   Gastrointestinal: Negative for vomiting and blood in stool.  Genitourinary: Negative for hematuria and decreased urine volume.  Musculoskeletal: Negative for acute joint swelling Skin: Negative for color change and wound.  Neurological: Negative for tremors and numbness other than noted  Psychiatric/Behavioral: Negative for decreased concentration or  hyperactivity.       Objective:   Physical Exam BP 106/74  Pulse 114  Temp(Src) 97.7 F (36.5 C) (Oral)  Ht 5\' 1"  (1.549 m)  Wt 207 lb (93.895 kg)  BMI 39.13 kg/m2  SpO2 95% VS noted,  Constitutional: Pt appears well-developed and well-nourished.  HENT: Head: NCAT.  Right Ear: External ear normal.  Left Ear: External ear normal.  Eyes: Conjunctivae and EOM are normal. Pupils are equal, round, and reactive to light.  Neck: Normal range of motion. Neck supple.  Cardiovascular: Normal rate and regular rhythm.   Pulmonary/Chest: Effort normal and breath sounds normal.  Abd:  Soft, NT, non-distended, + BS Neurological: Pt is alert. Not confused , motor 5/5, sens/dtr intact Skin: Skin is warm. No erythema.  Psychiatric: Pt behavior is normal. Thought content normal.  Spine;  Diffuse tender approx l3 and below and bilat lumbar paravertebral as well    Assessment & Plan:

## 2013-04-19 NOTE — Assessment & Plan Note (Signed)
Acute on chronic recurrent, no recent imaging  But suspect underlying DJd or DDD, for limited hydrocodone 5 325 and predpack asd, neuro exam ok , no recent falls or fever, will hold on imaging at this time

## 2013-04-28 ENCOUNTER — Telehealth: Payer: Self-pay | Admitting: Internal Medicine

## 2013-04-28 MED ORDER — MILNACIPRAN HCL 100 MG PO TABS: 100.0000 mg | ORAL_TABLET | Freq: Two times a day (BID) | ORAL | Status: DC

## 2013-04-28 NOTE — Telephone Encounter (Signed)
[facility]

## 2013-04-28 NOTE — Telephone Encounter (Signed)
Patient informed. 

## 2013-04-28 NOTE — Telephone Encounter (Signed)
Pt request for Savella 100 mg 1 a day [facility] [address]. Please call pt if this is ok. Pt stated she talk to Dr. Jonny Ruiz about this med last week and she wasn't sure if she could afford this med.

## 2013-04-29 ENCOUNTER — Other Ambulatory Visit: Payer: Self-pay | Admitting: Internal Medicine

## 2013-04-29 MED ORDER — ALENDRONATE SODIUM 10 MG PO TABS: 10.0000 mg | ORAL_TABLET | ORAL | Status: DC

## 2013-04-29 NOTE — Telephone Encounter (Signed)
rx corrected, [facility]

## 2013-08-10 ENCOUNTER — Ambulatory Visit (INDEPENDENT_AMBULATORY_CARE_PROVIDER_SITE_OTHER): Payer: BC Managed Care – PPO

## 2013-08-10 ENCOUNTER — Encounter: Payer: Self-pay | Admitting: Internal Medicine

## 2013-08-10 ENCOUNTER — Ambulatory Visit (INDEPENDENT_AMBULATORY_CARE_PROVIDER_SITE_OTHER): Payer: BC Managed Care – PPO | Admitting: Internal Medicine

## 2013-08-10 VITALS — BP 110/72 | HR 115 | Temp 98.1°F | Ht 62.0 in | Wt 205.0 lb

## 2013-08-10 DIAGNOSIS — M545 Low back pain, unspecified: Secondary | ICD-10-CM

## 2013-08-10 DIAGNOSIS — Z0000 Encounter for general adult medical examination without abnormal findings: Secondary | ICD-10-CM

## 2013-08-10 DIAGNOSIS — E119 Type 2 diabetes mellitus without complications: Secondary | ICD-10-CM

## 2013-08-10 DIAGNOSIS — G8929 Other chronic pain: Secondary | ICD-10-CM

## 2013-08-10 LAB — HEPATIC FUNCTION PANEL
ALBUMIN: 3.8 g/dL (ref 3.5–5.2)
ALT: 36 U/L — ABNORMAL HIGH (ref 0–35)
AST: 37 U/L (ref 0–37)
Alkaline Phosphatase: 73 U/L (ref 39–117)
BILIRUBIN TOTAL: 0.6 mg/dL (ref 0.3–1.2)
Bilirubin, Direct: 0 mg/dL (ref 0.0–0.3)
Total Protein: 7.6 g/dL (ref 6.0–8.3)

## 2013-08-10 LAB — CBC WITH DIFFERENTIAL/PLATELET
BASOS PCT: 0.5 % (ref 0.0–3.0)
Basophils Absolute: 0.1 10*3/uL (ref 0.0–0.1)
EOS PCT: 1 % (ref 0.0–5.0)
Eosinophils Absolute: 0.1 10*3/uL (ref 0.0–0.7)
HEMATOCRIT: 39.4 % (ref 36.0–46.0)
HEMOGLOBIN: 12.6 g/dL (ref 12.0–15.0)
LYMPHS ABS: 4.4 10*3/uL — AB (ref 0.7–4.0)
Lymphocytes Relative: 34.6 % (ref 12.0–46.0)
MCHC: 32.1 g/dL (ref 30.0–36.0)
MCV: 82.6 fl (ref 78.0–100.0)
MONO ABS: 0.7 10*3/uL (ref 0.1–1.0)
MONOS PCT: 5.9 % (ref 3.0–12.0)
Neutro Abs: 7.3 10*3/uL (ref 1.4–7.7)
Neutrophils Relative %: 58 % (ref 43.0–77.0)
Platelets: 237 10*3/uL (ref 150.0–400.0)
RBC: 4.77 Mil/uL (ref 3.87–5.11)
RDW: 15.6 % — ABNORMAL HIGH (ref 11.5–14.6)
WBC: 12.6 10*3/uL — AB (ref 4.5–10.5)

## 2013-08-10 LAB — BASIC METABOLIC PANEL
BUN: 17 mg/dL (ref 6–23)
CALCIUM: 9 mg/dL (ref 8.4–10.5)
CO2: 25 mEq/L (ref 19–32)
CREATININE: 0.8 mg/dL (ref 0.4–1.2)
Chloride: 106 mEq/L (ref 96–112)
GFR: 81.58 mL/min (ref >60.00)
GLUCOSE: 100 mg/dL — AB (ref 70–99)
POTASSIUM: 4.6 meq/L (ref 3.5–5.1)
Sodium: 141 mEq/L (ref 135–145)

## 2013-08-10 LAB — LIPID PANEL
CHOL/HDL RATIO: 3
CHOLESTEROL: 175 mg/dL (ref 0–200)
HDL: 62.8 mg/dL (ref >39.00)
Triglycerides: 250 mg/dL — ABNORMAL HIGH (ref 0.0–149.0)
VLDL: 50 mg/dL — ABNORMAL HIGH (ref 0.0–40.0)

## 2013-08-10 LAB — HEMOGLOBIN A1C: Hgb A1c MFr Bld: 6.2 % (ref 4.6–6.5)

## 2013-08-10 MED ORDER — CYCLOBENZAPRINE HCL 10 MG PO TABS: 10.0000 mg | ORAL_TABLET | Freq: Three times a day (TID) | ORAL | Status: DC | PRN

## 2013-08-10 MED ORDER — HYDROCODONE-ACETAMINOPHEN 5-325 MG PO TABS: 1.0000 | ORAL_TABLET | Freq: Four times a day (QID) | ORAL | Status: DC | PRN

## 2013-08-10 NOTE — Assessment & Plan Note (Signed)
stable overall by history and exam, recent data reviewed with pt, and pt to continue medical treatment as before,  to f/u any worsening symptoms or concerns Lab Results  Component Value Date   HGBA1C 6.1* 06/04/2012

## 2013-08-10 NOTE — Assessment & Plan Note (Signed)

## 2013-08-10 NOTE — Progress Notes (Signed)
Subjective:    Patient ID: [name], female    DOB: Sep 28, 1950, 63 y.o.   MRN: [id number]  HPI Here for wellness and f/u;  Overall doing ok;  Pt denies CP, worsening SOB, DOE, wheezing, orthopnea, PND, worsening LE edema, palpitations, dizziness or syncope.  Pt denies neurological change such as new headache, facial or extremity weakness.  Pt denies polydipsia, polyuria, or low sugar symptoms. Pt states overall good compliance with treatment and medications, good tolerability, and has been trying to follow lower cholesterol diet.  Pt denies worsening depressive symptoms, suicidal ideation or panic. No fever, night sweats, wt loss, loss of appetite, or other constitutional symptoms.  Pt states good ability with ADL's, has low fall risk, home safety reviewed and adequate, no other significant changes in hearing or vision, and only occasionally active with exercise. Smoked from 63yo to 57yo, some years 4 ppd, none for 5 yrs. Pt continues to have recurring LBP today with acute pain x 3 days as happens about 2-3 times per yr, and takes short course vicodin instead of usual tramadol and flexeril , bowel or bladder change, fever, wt loss,  worsening LE pain/numbness/weakness, gait change or falls. Past Medical History  Diagnosis Date  . ARTHRITIS 10/25/2007  . BREAST CANCER, HX OF 08/03/2006  . DEPRESSION 08/03/2006  . DIABETES MELLITUS, TYPE II 08/03/2006  . DYSPNEA ON EXERTION 11/21/2009  . EPISTAXIS, RECURRENT 01/27/2008  . FIBROMYALGIA 01/13/2008  . GERD 04/30/2008  . HEPATITIS C 08/03/2006  . HIP PAIN, RIGHT 04/30/2007  . HYPERTENSION 04/30/2008  . INSOMNIA-SLEEP DISORDER-UNSPEC 11/21/2009  . KNEE SPRAIN, ACUTE 01/27/2008  . LEG PAIN, LEFT 01/13/2008  . OSTEOPENIA 07/26/2007  . Persistent vomiting 08/17/2008  . History of detached retina repair 02/12/2011  . Arthritis   . Blood transfusion without reported diagnosis   . Cancer    Past Surgical History  Procedure Laterality Date  . Breast lumpectomy   2000    right with axillary LND  . Mastectomy  2007    Right.  Dr. Margot Chimes  . Tonsillectomy    . S/p right broken leg with surgury as teen    . Tubal ligation    . S/p thyroid fna neg.    . Cholecystectomy  06/05/2012    Procedure: LAPAROSCOPIC CHOLECYSTECTOMY;  Surgeon: Adin Hector, MD;  Location: [facility];  Service: General;  Laterality: N/A;  . Laparoscopic lysis of adhesions  06/05/2012    Procedure: LAPAROSCOPIC LYSIS OF ADHESIONS;  Surgeon: Adin Hector, MD;  Location: [facility];  Service: General;  Laterality: N/A;    reports that she has quit smoking. She quit smokeless tobacco use about 7 years ago. She reports that she drinks alcohol. She reports that she does not use illicit drugs. family history includes Cancer in her sister; Heart disease in her mother; Stroke in her father. Allergies  Allergen Reactions  . Ace Inhibitors Other (See Comments)    cough  . Iohexol      Desc: Pt has been pre medicated with prednisone before scans, pt is unsure of type of contrast she reacted to.   . Venlafaxine     REACTION: night sweats  . Tape Rash    "Tears my skin all up"   Current Outpatient Prescriptions on File Prior to Visit  Medication Sig Dispense Refill  . alendronate (FOSAMAX) 10 MG tablet Take 1 tablet (10 mg total) by mouth once a week. Take with a full glass of water on an empty  stomach.  12 tablet  3  . aspirin 81 MG tablet Take 81 mg by mouth daily.       Marland Kitchen buPROPion (WELLBUTRIN XL) 300 MG 24 hr tablet Take 1 tablet (300 mg total) by mouth every morning.  90 tablet  3  . cholestyramine (QUESTRAN) 4 GM/DOSE powder Take 1 packet (4 g total) by mouth 2 (two) times daily with a meal.  378 g  11  . cyclobenzaprine (FLEXERIL) 10 MG tablet Take 1 tablet (10 mg total) by mouth 2 (two) times daily as needed.  180 tablet  3  . FLUoxetine (PROZAC) 20 MG capsule Take 1 capsule (20 mg total) by mouth 3 (three) times daily.  270 capsule  3  . glucose blood (ONE TOUCH ULTRA TEST) test  strip Use as directed once daily  100 each  5  . HYDROcodone-acetaminophen (NORCO/VICODIN) 5-325 MG per tablet Take 1 tablet by mouth every 6 (six) hours as needed for pain.  60 tablet  0  . losartan (COZAAR) 25 MG tablet Take 1 tablet (25 mg total) by mouth daily.  90 tablet  3  . Milnacipran HCl (SAVELLA) 100 MG TABS tablet Take 1 tablet (100 mg total) by mouth 2 (two) times daily.  60 tablet  11  . omeprazole (PRILOSEC) 20 MG capsule Take 2 capsules (40 mg total) by mouth daily.  180 capsule  3  . temazepam (RESTORIL) 30 MG capsule Take 1 capsule (30 mg total) by mouth at bedtime as needed for sleep.  90 capsule  1  . traMADol (ULTRAM) 50 MG tablet Take 1 tablet (50 mg total) by mouth every 8 (eight) hours as needed for pain.  90 tablet  5   No current facility-administered medications on file prior to visit.    Review of Systems Constitutional: Negative for diaphoresis, activity change, appetite change or unexpected weight change.  HENT: Negative for hearing loss, ear pain, facial swelling, mouth sores and neck stiffness.   Eyes: Negative for pain, redness and visual disturbance.  Respiratory: Negative for shortness of breath and wheezing.   Cardiovascular: Negative for chest pain and palpitations.  Gastrointestinal: Negative for diarrhea, blood in stool, abdominal distention or other pain Genitourinary: Negative for hematuria, flank pain or change in urine volume.  Musculoskeletal: Negative for myalgias and joint swelling.  Skin: Negative for color change and wound.  Neurological: Negative for syncope and numbness. other than noted Hematological: Negative for adenopathy.  Psychiatric/Behavioral: Negative for hallucinations, self-injury, decreased concentration and agitation.      Objective:   Physical Exam BP 110/72  Pulse 115  Temp(Src) 98.1 F (36.7 C) (Oral)  Ht 5\' 2"  (1.575 m)  Wt 205 lb (92.987 kg)  BMI 37.49 kg/m2  SpO2 94% VS noted,  Constitutional: Pt is oriented to  person, place, and time. Appears well-developed and well-nourished. [name] Head: Normocephalic and atraumatic.  Right Ear: External ear normal.  Left Ear: External ear normal.  Nose: Nose normal.  Mouth/Throat: Oropharynx is clear and moist.  Eyes: Conjunctivae and EOM are normal. Pupils are equal, round, and reactive to light.  Neck: Normal range of motion. Neck supple. No JVD present. No tracheal deviation present.  Marked diffuse spine tender and paravertebral tender/spam Cardiovascular: Normal rate, regular rhythm, normal heart sounds and intact distal pulses.   Pulmonary/Chest: Effort normal and breath sounds normal.  Abdominal: Soft. Bowel sounds are normal. There is no tenderness. No HSM  Musculoskeletal: Normal range of motion. Exhibits no edema.  Lymphadenopathy:  Has no cervical adenopathy.  Neurological: Pt is alert and oriented to person, place, and time. Pt has normal reflexes. No cranial nerve deficit.  Skin: Skin is warm and dry. No rash noted.  Psychiatric:  Has mild dysphroci mood and affect. Behavior is normal.     Assessment & Plan:

## 2013-08-10 NOTE — Progress Notes (Signed)
Pre-visit discussion using our clinic review tool. No additional management support is needed unless otherwise documented below in the visit note.  

## 2013-08-10 NOTE — Assessment & Plan Note (Signed)
With flare, neuro exam ok, for short course vicodin prn, incr flexeril 5 tid prn, pt reqeusts referral pain clinic

## 2013-08-10 NOTE — Patient Instructions (Signed)
Please take all new medication as prescribed - the hydrocodone OK to increase the flexeril to three times per day as needed Please continue all other medications as before, and refills have been done if requested. Please have the pharmacy call with any other refills you may need. Please continue your efforts at being more active, low cholesterol diet, and weight control. You are otherwise up to date with prevention measures today.  You will be contacted regarding the referral for: pain clinic  Please go to the LAB in the Basement (turn left off the elevator) for the tests to be done today You will be contacted by phone if any changes need to be made immediately.  Otherwise, you will receive a letter about your results with an explanation, but please check with MyChart first.  Please return in 1 year for your yearly visit, or sooner if needed

## 2013-08-11 ENCOUNTER — Encounter: Payer: Self-pay | Admitting: Internal Medicine

## 2013-08-11 LAB — LDL CHOLESTEROL, DIRECT: Direct LDL: 85.5 mg/dL

## 2013-08-11 LAB — TSH: TSH: 3.68 u[IU]/mL (ref 0.35–5.50)

## 2013-09-05 ENCOUNTER — Encounter: Payer: Self-pay | Admitting: Internal Medicine

## 2013-09-22 ENCOUNTER — Other Ambulatory Visit: Payer: Self-pay | Admitting: Internal Medicine

## 2013-09-23 NOTE — Telephone Encounter (Signed)
Phoned in prescription refill.

## 2013-11-02 ENCOUNTER — Encounter: Payer: Self-pay | Admitting: Physical Medicine & Rehabilitation

## 2013-11-19 ENCOUNTER — Other Ambulatory Visit: Payer: Self-pay | Admitting: Internal Medicine

## 2013-11-22 NOTE — Telephone Encounter (Signed)
Done hardcopy to robin  

## 2013-11-22 NOTE — Telephone Encounter (Signed)
Faxed hardcopy to [facility] [place]  

## 2014-01-10 ENCOUNTER — Encounter: Payer: BC Managed Care – PPO | Admitting: Physical Medicine & Rehabilitation

## 2014-02-02 ENCOUNTER — Other Ambulatory Visit: Payer: Self-pay | Admitting: Internal Medicine

## 2014-02-02 NOTE — Telephone Encounter (Signed)
Done hardcopy to robin  

## 2014-02-02 NOTE — Telephone Encounter (Signed)
Faxed hardcopy to [facility] [place]  

## 2014-02-07 ENCOUNTER — Telehealth: Payer: Self-pay | Admitting: Internal Medicine

## 2014-02-07 NOTE — Telephone Encounter (Signed)
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

## 2014-02-07 NOTE — Telephone Encounter (Signed)
Please ask pt to be more specific, ? Which meds does she think to change? And why.

## 2014-02-07 NOTE — Telephone Encounter (Signed)
Patient is requesting to go up in strength on several meds and would like to add a med.  I told her that she would probably need a med follow up if she has not spoken with Dr. Jenny Reichmann before about this.  Please advise.

## 2014-02-07 NOTE — Telephone Encounter (Signed)
Pain management is too expensive at this time, first visit would be $400.  Could the tramadol and cyclobenzaprine be increased or other alternative not controlled to be prescribed?  Previously she took hydroxyzine and would like to know if PCP would prescribe this.

## 2014-02-08 NOTE — Telephone Encounter (Signed)
Called left message to call back 

## 2014-02-08 NOTE — Telephone Encounter (Signed)
Patient informed of MD response to her question.  The patient did verbalize understanding.

## 2014-04-13 ENCOUNTER — Telehealth: Payer: Self-pay | Admitting: Internal Medicine

## 2014-04-13 ENCOUNTER — Other Ambulatory Visit: Payer: Self-pay

## 2014-04-13 MED ORDER — TRAMADOL HCL 50 MG PO TABS: ORAL_TABLET | ORAL | Status: DC

## 2014-04-13 MED ORDER — FLUOXETINE HCL 20 MG PO CAPS: 20.0000 mg | ORAL_CAPSULE | Freq: Three times a day (TID) | ORAL | Status: DC

## 2014-04-13 MED ORDER — CHOLESTYRAMINE 4 GM/DOSE PO POWD: 4.0000 g | Freq: Two times a day (BID) | ORAL | Status: DC

## 2014-04-13 MED ORDER — OMEPRAZOLE 20 MG PO CPDR: 40.0000 mg | DELAYED_RELEASE_CAPSULE | Freq: Every day | ORAL | Status: AC

## 2014-04-13 MED ORDER — BUPROPION HCL ER (XL) 300 MG PO TB24: 300.0000 mg | ORAL_TABLET | ORAL | Status: DC

## 2014-04-13 MED ORDER — LOSARTAN POTASSIUM 25 MG PO TABS: 25.0000 mg | ORAL_TABLET | Freq: Every day | ORAL | Status: DC

## 2014-04-13 NOTE — Telephone Encounter (Signed)
Faxed hardcopy for Tramadol to [facility] [place]

## 2014-04-13 NOTE — Telephone Encounter (Signed)
Done hardcopy to robin  

## 2014-04-13 NOTE — Telephone Encounter (Signed)
[facility] will be faxing over 10 prescription to be refilled for patient, a 90 day supply

## 2014-05-17 ENCOUNTER — Ambulatory Visit (INDEPENDENT_AMBULATORY_CARE_PROVIDER_SITE_OTHER): Payer: BC Managed Care – PPO | Admitting: Internal Medicine

## 2014-05-17 ENCOUNTER — Encounter: Payer: Self-pay | Admitting: Internal Medicine

## 2014-05-17 VITALS — BP 112/70 | HR 110 | Temp 98.2°F | Ht 62.0 in | Wt 204.2 lb

## 2014-05-17 DIAGNOSIS — R05 Cough: Secondary | ICD-10-CM

## 2014-05-17 DIAGNOSIS — E08329 Diabetes mellitus due to underlying condition with mild nonproliferative diabetic retinopathy without macular edema: Secondary | ICD-10-CM

## 2014-05-17 DIAGNOSIS — R059 Cough, unspecified: Secondary | ICD-10-CM | POA: Insufficient documentation

## 2014-05-17 DIAGNOSIS — I10 Essential (primary) hypertension: Secondary | ICD-10-CM

## 2014-05-17 MED ORDER — LEVOFLOXACIN 250 MG PO TABS: 250.0000 mg | ORAL_TABLET | Freq: Every day | ORAL | Status: DC

## 2014-05-17 MED ORDER — HYDROCODONE-HOMATROPINE 5-1.5 MG/5ML PO SYRP: 5.0000 mL | ORAL_SOLUTION | Freq: Four times a day (QID) | ORAL | Status: DC | PRN

## 2014-05-17 NOTE — Patient Instructions (Signed)
Please take all new medication as prescribed - the antibiotic, and cough medicine  Please continue all other medications as before, and refills have been done if requested.  Please have the pharmacy call with any other refills you may need.  Please keep your appointments with your specialists as you may have planned      

## 2014-05-17 NOTE — Assessment & Plan Note (Signed)
stable overall by history and exam, recent data reviewed with pt, and pt to continue medical treatment as before,  to f/u any worsening symptoms or concerns BP Readings from Last 3 Encounters:  05/17/14 112/70  08/10/13 110/72  04/19/13 106/74

## 2014-05-17 NOTE — Assessment & Plan Note (Signed)
Acute onset, c/w bronchitis vs pna - declines cxr, Mild to mod, for antibx course,  to f/u any worsening symptoms or concern and cough med prn use,  to f/u any worsening symptoms or concerns, also mucinex otc prn

## 2014-05-17 NOTE — Assessment & Plan Note (Signed)
stable overall by history and exam, recent data reviewed with pt, and pt to continue medical treatment as before,  to f/u any worsening symptoms or concerns Lab Results  Component Value Date   HGBA1C 6.2 08/10/2013

## 2014-05-17 NOTE — Progress Notes (Signed)
Subjective:    Patient ID: [name], female    DOB: Apr 15, 1951, 63 y.o.   MRN: [id number]  HPI Here with acute onset mild to mod 2-3 days ST, HA, general weakness and malaise, with prod cough greenish sputum, but Pt denies chest pain, increased sob or doe, wheezing, orthopnea, PND, increased LE swelling, palpitations, dizziness or syncope.  Pt denies new neurological symptoms such as new headache, or facial or extremity weakness or numbness   Pt denies polydipsia, polyuria, or low sugar symptoms such as weakness or confusion improved with po intake.  Past Medical History  Diagnosis Date  . ARTHRITIS 10/25/2007  . BREAST CANCER, HX OF 08/03/2006  . DEPRESSION 08/03/2006  . DIABETES MELLITUS, TYPE II 08/03/2006  . DYSPNEA ON EXERTION 11/21/2009  . EPISTAXIS, RECURRENT 01/27/2008  . FIBROMYALGIA 01/13/2008  . GERD 04/30/2008  . HEPATITIS C 08/03/2006  . HIP PAIN, RIGHT 04/30/2007  . HYPERTENSION 04/30/2008  . INSOMNIA-SLEEP DISORDER-UNSPEC 11/21/2009  . KNEE SPRAIN, ACUTE 01/27/2008  . LEG PAIN, LEFT 01/13/2008  . OSTEOPENIA 07/26/2007  . Persistent vomiting 08/17/2008  . History of detached retina repair 02/12/2011  . Arthritis   . Blood transfusion without reported diagnosis   . Cancer    Past Surgical History  Procedure Laterality Date  . Breast lumpectomy  2000    right with axillary LND  . Mastectomy  2007    Right.  Dr. Margot Chimes  . Tonsillectomy    . S/p right broken leg with surgury as teen    . Tubal ligation    . S/p thyroid fna neg.    . Cholecystectomy  06/05/2012    Procedure: LAPAROSCOPIC CHOLECYSTECTOMY;  Surgeon: Adin Hector, MD;  Location: [facility];  Service: General;  Laterality: N/A;  . Laparoscopic lysis of adhesions  06/05/2012    Procedure: LAPAROSCOPIC LYSIS OF ADHESIONS;  Surgeon: Adin Hector, MD;  Location: [facility];  Service: General;  Laterality: N/A;    reports that she has quit smoking. She quit smokeless tobacco use about 7 years ago. She reports that she drinks  alcohol. She reports that she does not use illicit drugs. family history includes Cancer in her sister; Heart disease in her mother; Stroke in her father. Allergies  Allergen Reactions  . Ace Inhibitors Other (See Comments)    cough  . Iohexol      Desc: Pt has been pre medicated with prednisone before scans, pt is unsure of type of contrast she reacted to.   . Venlafaxine     REACTION: night sweats  . Tape Rash    "Tears my skin all up"   Current Outpatient Prescriptions on File Prior to Visit  Medication Sig Dispense Refill  . aspirin 81 MG tablet Take 81 mg by mouth daily.     Marland Kitchen buPROPion (WELLBUTRIN XL) 300 MG 24 hr tablet Take 1 tablet (300 mg total) by mouth every morning. 90 tablet 2  . cholestyramine (QUESTRAN) 4 GM/DOSE powder Take 1 packet (4 g total) by mouth 2 (two) times daily with a meal. 378 g 11  . cyclobenzaprine (FLEXERIL) 10 MG tablet Take 1 tablet (10 mg total) by mouth 3 (three) times daily as needed. 90 tablet 5  . FLUoxetine (PROZAC) 20 MG capsule Take 1 capsule (20 mg total) by mouth 3 (three) times daily. 270 capsule 3  . glucose blood (ONE TOUCH ULTRA TEST) test strip Use as directed once daily 100 each 5  . HYDROcodone-acetaminophen (NORCO/VICODIN) 5-325 MG  per tablet Take 1 tablet by mouth every 6 (six) hours as needed. 60 tablet 0  . losartan (COZAAR) 25 MG tablet Take 1 tablet (25 mg total) by mouth daily. 90 tablet 3  . Milnacipran HCl (SAVELLA) 100 MG TABS tablet Take 1 tablet (100 mg total) by mouth 2 (two) times daily. 60 tablet 11  . omeprazole (PRILOSEC) 20 MG capsule Take 2 capsules (40 mg total) by mouth daily. 180 capsule 3  . temazepam (RESTORIL) 30 MG capsule TAKE ONE CAPSULE BY MOUTH AT BEDTIME AS NEEDED FOR SLEEP 90 capsule 1  . traMADol (ULTRAM) 50 MG tablet TAKE 1 TABLET EVERY 8 HOURS AS NEEDED FOR PAIN 90 tablet 2   No current facility-administered medications on file prior to visit.   Review of Systems  Constitutional: Negative for  unusual diaphoresis or other sweats  HENT: Negative for ringing in ear Eyes: Negative for double vision or worsening visual disturbance.  Respiratory: Negative for choking and stridor.   Gastrointestinal: Negative for vomiting or other signifcant bowel change Genitourinary: Negative for hematuria or decreased urine volume.  Musculoskeletal: Negative for other MSK pain or swelling Skin: Negative for color change and worsening wound.  Neurological: Negative for tremors and numbness other than noted  Psychiatric/Behavioral: Negative for decreased concentration or agitation other than above       Objective:   Physical Exam BP 112/70 mmHg  Pulse 110  Temp(Src) 98.2 F (36.8 C) (Oral)  Ht 5\' 2"  (1.575 m)  Wt 204 lb 4 oz (92.647 kg)  BMI 37.35 kg/m2  SpO2 94% VS noted, mild ill, severe coughing spells in exam room Constitutional: Pt appears well-developed, well-nourished.  HENT: Head: NCAT.  Right Ear: External ear normal.  Left Ear: External ear normal.  Eyes: . Pupils are equal, round, and reactive to light. Conjunctivae and EOM are normal Neck: Normal range of motion. Neck supple.  Cardiovascular: Normal rate and regular rhythm.   Pulmonary/Chest: Effort normal and breath sounds somewhat decreased but no overt rales or wheezing  Neurological: Pt is alert. Not confused , motor grossly intact Skin: Skin is warm. No rash Psychiatric: Pt behavior is normal. No agitation.      Assessment & Plan:

## 2014-05-17 NOTE — Progress Notes (Signed)
Pre visit review using our clinic review tool, if applicable. No additional management support is needed unless otherwise documented below in the visit note. 

## 2014-08-01 ENCOUNTER — Other Ambulatory Visit: Payer: Self-pay | Admitting: Specialist

## 2014-08-01 DIAGNOSIS — M545 Low back pain: Secondary | ICD-10-CM

## 2014-08-02 ENCOUNTER — Encounter (HOSPITAL_COMMUNITY): Payer: Self-pay

## 2014-08-14 ENCOUNTER — Other Ambulatory Visit: Payer: Self-pay

## 2014-08-24 ENCOUNTER — Encounter: Payer: Self-pay | Admitting: Pulmonary Disease

## 2014-08-24 ENCOUNTER — Ambulatory Visit (INDEPENDENT_AMBULATORY_CARE_PROVIDER_SITE_OTHER): Payer: BLUE CROSS/BLUE SHIELD | Admitting: Pulmonary Disease

## 2014-08-24 ENCOUNTER — Encounter (INDEPENDENT_AMBULATORY_CARE_PROVIDER_SITE_OTHER): Payer: Self-pay

## 2014-08-24 VITALS — BP 132/76 | HR 96 | Temp 97.6°F | Ht 60.0 in | Wt 203.2 lb

## 2014-08-24 DIAGNOSIS — R0609 Other forms of dyspnea: Secondary | ICD-10-CM

## 2014-08-24 NOTE — Patient Instructions (Signed)
You do not have copd by your breathing tests, but you can still have emphysema. Work on weight loss and conditioning Would recommend a cardiac workup by your primary doctor, and if totally normal, will do FULL breathing tests for further evaluation.  Please let me know if this is the case.

## 2014-08-24 NOTE — Assessment & Plan Note (Addendum)
The patient has significant dyspnea on exertion, but very surprisingly has no airflow obstruction on her spirometry today despite her extensive smoking history. I have explained to her that she does not have COPD, but this does not mean she does not have emphysema. I suspect her dyspnea is multifactorial, and that her weight and conditioning are also significant contributors. She probably does have an element of restriction from her centripetal obesity, but we did not do lung volumes or DLCO today. There is no history to suggest thromboembolic disease. I have recommended that she work on weight and conditioning, and that she undergo a thorough cardiac evaluation for completeness. If this is totally normal, I would consider doing FULL PFTs to make sure there is nothing else going on.  Of note, we did do ambulatory oximetry today, and she did not drop below 95% with 3 laps around the office.

## 2014-08-24 NOTE — Progress Notes (Signed)
   Subjective:    Patient ID: [name], female    DOB: 1950/08/27, 64 y.o.   MRN: [id number]  HPI The patient is a 64 year old female who I've been asked to see for dyspnea on exertion. She tells me that she has had shortness of breath for the last 2 years, but it has been progressive in nature. It does not occur at rest, but she has gotten to the point that any walking with distance gets her very short of breath. She tells me that she has a less than one block dyspnea on exertion at a moderate pace on flat ground, and will get winded bringing groceries in from the car. 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 She has a history of smoking one to 4 packs of cigarettes per day for 40 years, but has not smoked since 2007. She denies any cough or mucus production, and has not had any lower extremity edema. She denies any history of pleuritic chest pain, nor does she have a history of thromboembolic disease. She has no history of cardiac disease, but has not had a recent cardiac evaluation. Her weight has been stable over the last one year.   Review of Systems  Constitutional: Negative for fever and unexpected weight change.  HENT: Negative for congestion, dental problem, ear pain, nosebleeds, postnasal drip, rhinorrhea, sinus pressure, sneezing, sore throat and trouble swallowing.   Eyes: Negative for redness and itching.  Respiratory: Positive for shortness of breath. Negative for cough, chest tightness and wheezing.   Cardiovascular: Negative for palpitations and leg swelling.  Gastrointestinal: Negative for nausea and vomiting.  Genitourinary: Negative for dysuria.  Musculoskeletal: Negative for joint swelling.  Skin: Negative for rash.  Neurological: Negative for headaches.  Hematological: Does not bruise/bleed easily.  Psychiatric/Behavioral: Positive for dysphoric mood. The patient is not nervous/anxious.        Objective:   Physical  Exam Constitutional:  Obese female, no acute distress  HENT:  Nares patent without discharge  Oropharynx without exudate, palate and uvula are mildly elongated.  Eyes:  Perrla, eomi, no scleral icterus  Neck:  No JVD, no TMG  Cardiovascular:  Normal rate, regular rhythm, no rubs or gallops.  No murmurs        Intact distal pulses  Pulmonary :  Normal breath sounds, no stridor or respiratory distress   No rales, rhonchi, or wheezing  Abdominal:  Soft, nondistended, bowel sounds present.  No tenderness noted.   Musculoskeletal:  No lower extremity edema noted.  Lymph Nodes:  No cervical lymphadenopathy noted  Skin:  No cyanosis noted  Neurologic:  Alert, appropriate, moves all 4 extremities without obvious deficit.         Assessment & Plan:

## 2014-08-30 ENCOUNTER — Other Ambulatory Visit: Payer: Self-pay

## 2014-09-06 ENCOUNTER — Telehealth (HOSPITAL_COMMUNITY): Payer: Self-pay | Admitting: Cardiovascular Disease

## 2014-09-06 NOTE — Telephone Encounter (Signed)
Received records from [facility] for appointment with Dr Gwenlyn Found on 09/20/14.  Records given to [facility] (medical records) for Dr Kennon Holter schedule on 09/20/14.  lp

## 2014-09-18 ENCOUNTER — Other Ambulatory Visit: Payer: BLUE CROSS/BLUE SHIELD

## 2014-09-20 ENCOUNTER — Ambulatory Visit: Payer: BLUE CROSS/BLUE SHIELD | Admitting: Cardiovascular Disease

## 2014-10-10 ENCOUNTER — Ambulatory Visit
Admission: RE | Admit: 2014-10-10 | Discharge: 2014-10-10 | Disposition: A | Discharge status: Home / Self Care | Payer: BLUE CROSS/BLUE SHIELD | Source: Ambulatory Visit | Attending: Specialist | Admitting: Specialist

## 2014-10-10 DIAGNOSIS — M545 Low back pain: Secondary | ICD-10-CM

## 2014-10-10 MED ORDER — GADOBENATE DIMEGLUMINE 529 MG/ML IV SOLN
18.0000 mL | Freq: Once | INTRAVENOUS | Status: AC | PRN
  Administered 2014-10-10: 18 mL via INTRAVENOUS

## 2014-10-11 ENCOUNTER — Ambulatory Visit: Payer: BLUE CROSS/BLUE SHIELD | Admitting: Cardiovascular Disease

## 2015-01-04 ENCOUNTER — Encounter: Payer: Self-pay | Admitting: Internal Medicine

## 2015-01-08 ENCOUNTER — Telehealth: Payer: Self-pay | Admitting: *Deleted

## 2015-01-08 DIAGNOSIS — R0600 Dyspnea, unspecified: Secondary | ICD-10-CM

## 2015-01-08 NOTE — Telephone Encounter (Signed)
Order placed for PFT. 

## 2015-01-09 ENCOUNTER — Ambulatory Visit (INDEPENDENT_AMBULATORY_CARE_PROVIDER_SITE_OTHER): Payer: BLUE CROSS/BLUE SHIELD | Admitting: Internal Medicine

## 2015-01-09 ENCOUNTER — Encounter: Payer: Self-pay | Admitting: Internal Medicine

## 2015-01-09 VITALS — BP 112/86 | HR 121 | Temp 98.1°F | Ht 61.0 in | Wt 183.0 lb

## 2015-01-09 DIAGNOSIS — E669 Obesity, unspecified: Secondary | ICD-10-CM

## 2015-01-09 DIAGNOSIS — R0609 Other forms of dyspnea: Secondary | ICD-10-CM | POA: Diagnosis not present

## 2015-01-09 NOTE — Assessment & Plan Note (Signed)
Multifactorial: Obesity, central obesity, deconditioning, sedentary lifestyle, possible restrictive lung issues, possible OSA  In lieu of negative cardiac workup for dyspnea, and with a normal walk test along with essentially normal spirometry, I highly suspect the patient has deconditioning and obesity as a cause for her dyspnea. However, full PFTs were not done yet, and these will be performed prior to her next visit. I have explained to the patient if a PFT shows a restrictive process, further workup with high-resolution CAT scan, especially if the DLCO is reduced, will be ordered. In the interim I've advised patient to start a exercise regimen, preferably water aerobics given her arthritis, uses a walker, healthy diet and 1-2 pounds per week weight loss.  Plan: - full PFTs prior to follow up.  - diet and exercise as tolerated - goal weight loss (healthy) about 1lbs\week - obtain records (ECHO and Dobutamine stress) from [facility]  - given level of right hip discomfort, use walker with daily activities

## 2015-01-09 NOTE — Patient Instructions (Addendum)
Follow up with Dr. Stevenson Clinch in 2 week - full PFTs prior to follow up.  - diet and exercise as tolerated - goal weight loss (healthy) about 1lbs\week - we will obtain your records (ECHO and Dobutamine stress) from [facility]  - given your level of right hip discomfort, please use walker with daily activities, this will also help your breathing.

## 2015-01-09 NOTE — Assessment & Plan Note (Signed)
OBESITY  Discussed importance of weight reduction.  Educated regarding limitation of  intake of greasy/fried foods.  Instructed on benefit of  a low-impact exercise program, starting slowly.  Discussed benefits of 30-45 minutes of some form of exercise daily as well as benefit of supervised exercise program.    

## 2015-01-09 NOTE — Progress Notes (Signed)
MRN# [id number] [name] October 15, 1950 PMD - Dr. Deland Pretty  CC: Chief Complaint  Patient presents with  . Advice Only    SOB w/activity; no cough; no chest tightness/pain      Brief History: HPI 07/2014 HPI - Dr. Gwenette Greet The patient is a 64 year old female who I've been asked to see for dyspnea on exertion. She tells me that she has had shortness of breath for the last 2 years, but it has been progressive in nature. It does not occur at rest, but she has gotten to the point that any walking with distance gets her very short of breath. She tells me that she has a less than one block dyspnea on exertion at a moderate pace on flat ground, and will get winded bringing groceries in from the car. 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 She has a history of smoking one to 4 packs of cigarettes per day for 40 years, but has not smoked since 2007. She denies any cough or mucus production, and has not had any lower extremity edema. She denies any history of pleuritic chest pain, nor does she have a history of thromboembolic disease. She has no history of cardiac disease, but has not had a recent cardiac evaluation. Her weight has been stable over the last one year.  A/P The patient has significant dyspnea on exertion, but very surprisingly has no airflow obstruction on her spirometry today despite her extensive smoking history. I have explained to her that she does not have COPD, but this does not mean she does not have emphysema. I suspect her dyspnea is multifactorial, and that her weight and conditioning are also significant contributors. She probably does have an element of restriction from her centripetal obesity, but we did not do lung volumes or DLCO today. There is no history to suggest thromboembolic disease. I have recommended that she work on weight and conditioning, and that she undergo a thorough cardiac evaluation for completeness. If this is  totally normal, I would consider doing FULL PFTs to make sure there is nothing else going on. Of note, we did do ambulatory oximetry today, and she did not drop below 95% with 3 laps around the office.   Events since last clinic visit: Patient presents today for follow-up visit of dyspnea. She was last seen at [facility] for Dyspnea, 6MWT was normal and spirometry showed no airflow obstruction. Since that visit she has follow-up with cardiology at [facility]. Patient states that she had a dobutamine stress test and echocardiogram, both of which were normal, and per patient, ruled out cardiac causes as a cause of dyspnea.  The cardiologist at [facility] advised patient to follow back up with pulmonary. Further history reveals that patient had a laparoscopic cholecystectomy 5427, this was complicated by deconditioning and slow recovery time in terms of her respiratory status, since then she has had worsening dyspnea on exertion only. 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 Patient is set up to get overnight pulse oximetry study which was ordered by her primary care physician who suspected she has sleep apnea. Today patient states she has significant right hip arthritis, and will require a right hip replacement, but endorse dyspnea on exertion from parking lot to office lobby. Patient states at times even going to the supermarket or moving around  the house and will induce dyspnea.   Medication:   Current Outpatient Rx  Name  Route  Sig  Dispense  Refill  . buPROPion (WELLBUTRIN XL) 300 MG 24 hr tablet   Oral   Take 1 tablet (300 mg total) by mouth every morning.   90 tablet   2   . cholestyramine (QUESTRAN) 4 GM/DOSE powder   Oral   Take 1 packet (4 g total) by mouth 2 (two) times daily with a meal. Patient taking differently: Take 4 g by mouth daily.    378 g   11   .  FLUoxetine (PROZAC) 20 MG capsule   Oral   Take 1 capsule (20 mg total) by mouth 3 (three) times daily.   270 capsule   3   . glucose blood (ONE TOUCH ULTRA TEST) test strip      Use as directed once daily   100 each   5   . HYDROcodone-acetaminophen (NORCO) 10-325 MG per tablet   Oral   Take 1 tablet by mouth every 6 (six) hours as needed.         Marland Kitchen losartan (COZAAR) 25 MG tablet   Oral   Take 1 tablet (25 mg total) by mouth daily.   90 tablet   3   . omeprazole (PRILOSEC) 20 MG capsule   Oral   Take 2 capsules (40 mg total) by mouth daily.   180 capsule   3      Review of Systems: Gen:  Denies  fever, sweats, chills HEENT: Denies blurred vision, double vision, ear pain, eye pain, hearing loss, nose bleeds, sore throat Cvc:  No dizziness, chest pain or heaviness Resp:   Admits QT:[id number] of breath only with exertion  Gi: Denies swallowing difficulty, stomach pain, nausea or vomiting, diarrhea, constipation, bowel incontinence Gu:  Denies bladder incontinence, burning urine Ext:   No Joint pain, stiffness or swelling Skin: No skin rash, easy bruising or bleeding or hives Endoc:  No polyuria, polydipsia , polyphagia or weight change Other:  All other systems negative  Allergies:  Ace inhibitors; Iohexol; and Tape  Physical Examination:  VS: BP 112/86 mmHg  Pulse 121  Temp(Src) 98.1 F (36.7 C) (Oral)  Ht 5\' 1"  (1.549 m)  Wt 183 lb (83.008 kg)  BMI 34.60 kg/m2  SpO2 94%  General Appearance: No distress  HEENT: PERRLA, no ptosis, no other lesions noticed Pulmonary:normal breath sounds., diaphragmatic excursion normal.No wheezing, No rales   Cardiovascular:  Normal S1,S2.  No m/r/g.     Abdomen:Exam: Benign, Soft, non-tender, No masses, Central obestiy  Skin:   warm, no rashes, no ecchymosis  Extremities: normal, no cyanosis, clubbing, warm with normal capillary refill.      Rad results: (The following images and results were reviewed by Dr.  Stevenson Clinch). 06/06/12 CXR  CHEST - 2 VIEW  Comparison: 06/04/2012  Findings: Cardiomediastinal silhouette is stable. There is elevation of the right hemidiaphragm. Hiatal hernia is stable. Mild basilar atelectasis. No pulmonary edema.  IMPRESSION: Mild basilar atelectasis. Elevation of the right hemidiaphragm. No pulmonary edema.    Assessment and Plan:63 yo female seen in follow up for chronic dyspnea on exertion.  DOE (dyspnea on exertion) Multifactorial: Obesity, central obesity, deconditioning, sedentary lifestyle, possible restrictive lung issues, possible OSA  In lieu of negative cardiac workup for dyspnea, and with a normal walk test along with essentially normal spirometry, I highly suspect the patient has deconditioning and obesity as a cause for her dyspnea. However,  full PFTs were not done yet, and these will be performed prior to her next visit. I have explained to the patient if a PFT shows a restrictive process, further workup with high-resolution CAT scan, especially if the DLCO is reduced, will be ordered. In the interim I've advised patient to start a exercise regimen, preferably water aerobics given her arthritis, uses a walker, healthy diet and 1-2 pounds per week weight loss.  Plan: - full PFTs prior to follow up.  - diet and exercise as tolerated - goal weight loss (healthy) about 1lbs\week - obtain records (ECHO and Dobutamine stress) from [facility]  - given level of right hip discomfort, use walker with daily activities  Obesity (BMI 30-39.9) OBESITY  Discussed importance of weight reduction.  Educated regarding limitation of  intake of greasy/fried foods.  Instructed on benefit of  a low-impact exercise program, starting slowly.  Discussed benefits of 30-45 minutes of some form of exercise daily as well as benefit of supervised exercise program.       Updated Medication List Outpatient Encounter Prescriptions as of 01/09/2015  Medication Sig  .  buPROPion (WELLBUTRIN XL) 300 MG 24 hr tablet Take 1 tablet (300 mg total) by mouth every morning.  . cholestyramine (QUESTRAN) 4 GM/DOSE powder Take 1 packet (4 g total) by mouth 2 (two) times daily with a meal. (Patient taking differently: Take 4 g by mouth daily. )  . FLUoxetine (PROZAC) 20 MG capsule Take 1 capsule (20 mg total) by mouth 3 (three) times daily.  Marland Kitchen glucose blood (ONE TOUCH ULTRA TEST) test strip Use as directed once daily  . HYDROcodone-acetaminophen (NORCO) 10-325 MG per tablet Take 1 tablet by mouth every 6 (six) hours as needed.  Marland Kitchen losartan (COZAAR) 25 MG tablet Take 1 tablet (25 mg total) by mouth daily.  Marland Kitchen omeprazole (PRILOSEC) 20 MG capsule Take 2 capsules (40 mg total) by mouth daily.  . [DISCONTINUED] aspirin 81 MG tablet Take 81 mg by mouth daily.   . [DISCONTINUED] Ginkgo Biloba 40 MG TABS Take by mouth daily.  . [DISCONTINUED] Omega-3 Fatty Acids (FISH OIL PO) Take by mouth daily.  . [DISCONTINUED] temazepam (RESTORIL) 30 MG capsule TAKE ONE CAPSULE BY MOUTH AT BEDTIME AS NEEDED FOR SLEEP  . [DISCONTINUED] traMADol (ULTRAM) 50 MG tablet TAKE 1 TABLET EVERY 8 HOURS AS NEEDED FOR PAIN   No facility-administered encounter medications on file as of 01/09/2015.    Orders for this visit: No orders of the defined types were placed in this encounter.    Thank  you for the visitation and for allowing  [facility] to assist in the care of your patient. Our recommendations are noted above.  Please contact us if we can be of further service.  Vilinda Boehringer, MD [facility] Office Number: [phone]

## 2015-01-24 ENCOUNTER — Ambulatory Visit (INDEPENDENT_AMBULATORY_CARE_PROVIDER_SITE_OTHER): Payer: BLUE CROSS/BLUE SHIELD | Admitting: Internal Medicine

## 2015-01-24 ENCOUNTER — Ambulatory Visit: Payer: BLUE CROSS/BLUE SHIELD | Admitting: Internal Medicine

## 2015-01-24 ENCOUNTER — Encounter: Payer: Self-pay | Admitting: Internal Medicine

## 2015-01-24 VITALS — BP 120/78 | HR 90 | Ht 61.0 in | Wt 183.0 lb

## 2015-01-24 DIAGNOSIS — R0609 Other forms of dyspnea: Secondary | ICD-10-CM | POA: Diagnosis not present

## 2015-01-24 DIAGNOSIS — R0600 Dyspnea, unspecified: Secondary | ICD-10-CM

## 2015-01-24 DIAGNOSIS — E669 Obesity, unspecified: Secondary | ICD-10-CM | POA: Diagnosis not present

## 2015-01-24 LAB — PULMONARY FUNCTION TEST
DL/VA % pred: 85 %
DL/VA: 3.78 ml/min/mmHg/L
DLCO UNC % PRED: 86 %
DLCO UNC: 17.39 ml/min/mmHg
FEF 25-75 POST: 1.28 L/s
FEF 25-75 PRE: 1.76 L/s
FEF2575-%Change-Post: -27 %
FEF2575-%PRED-POST: 65 %
FEF2575-%Pred-Pre: 89 %
FEV1-%CHANGE-POST: -27 %
FEV1-%PRED-POST: 70 %
FEV1-%Pred-Pre: 96 %
FEV1-Post: 1.5 L
FEV1-Pre: 2.06 L
FEV1FVC-%Change-Post: -27 %
FEV1FVC-%Pred-Pre: 94 %
FEV6-%Change-Post: 0 %
FEV6-%Pred-Post: 105 %
FEV6-%Pred-Pre: 105 %
FEV6-POST: 2.84 L
FEV6-Pre: 2.83 L
FEV6FVC-%PRED-POST: 104 %
FEV6FVC-%Pred-Pre: 104 %
FVC-%CHANGE-POST: 0 %
FVC-%Pred-Post: 101 %
FVC-%Pred-Pre: 100 %
FVC-PRE: 2.83 L
FVC-Post: 2.85 L
POST FEV6/FVC RATIO: 100 %
PRE FEV6/FVC RATIO: 100 %
Post FEV1/FVC ratio: 53 %
Pre FEV1/FVC ratio: 73 %

## 2015-01-24 NOTE — Progress Notes (Signed)
MRN# [id number] [name] Oct 07, 1950   CC: Chief Complaint  Patient presents with  . Follow-up    PFT today; SOB w/activity;      Brief History: HPI 07/2014 HPI - Dr. Gwenette Greet The patient is a 64 year old female who I've been asked to see for dyspnea on exertion. She tells me that she has had shortness of breath for the last 2 years, but it has been progressive in nature. It does not occur at rest, but she has gotten to the point that any walking with distance gets her very short of breath. She tells me that she has a less than one block dyspnea on exertion at a moderate pace on flat ground, and will get winded bringing groceries in from the car. 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 She has a history of smoking one to 4 packs of cigarettes per day for 40 years, but has not smoked since 2007. She denies any cough or mucus production, and has not had any lower extremity edema. She denies any history of pleuritic chest pain, nor does she have a history of thromboembolic disease. She has no history of cardiac disease, but has not had a recent cardiac evaluation. Her weight has been stable over the last one year.  A/P The patient has significant dyspnea on exertion, but very surprisingly has no airflow obstruction on her spirometry today despite her extensive smoking history. I have explained to her that she does not have COPD, but this does not mean she does not have emphysema. I suspect her dyspnea is multifactorial, and that her weight and conditioning are also significant contributors. She probably does have an element of restriction from her centripetal obesity, but we did not do lung volumes or DLCO today. There is no history to suggest thromboembolic disease. I have recommended that she work on weight and conditioning, and that she undergo a thorough cardiac evaluation for completeness. If this is totally normal, I would consider doing FULL PFTs  to make sure there is nothing else going on. Of note, we did do ambulatory oximetry today, and she did not drop below 95% with 3 laps around the office.   ROV 01/09/15 Patient presents today for follow-up visit of dyspnea. She was last seen at [facility] for Dyspnea, 6MWT was normal and spirometry showed no airflow obstruction. Since that visit she has follow-up with cardiology at [facility]. Patient states that she had a dobutamine stress test and echocardiogram, both of which were normal, and per patient, ruled out cardiac causes as a cause of dyspnea. The cardiologist at [facility] advised patient to follow back up with pulmonary. Further history reveals that patient had a laparoscopic cholecystectomy 6301, this was complicated by deconditioning and slow recovery time in terms of her respiratory status, since then she has had worsening dyspnea on exertion only. 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 Patient is set up to get overnight pulse oximetry study which was ordered by her primary care physician who suspected she has sleep apnea. Today patient states she has significant right hip arthritis, and will require a right hip replacement, but endorse dyspnea on exertion from parking lot to office lobby. Patient states at times even going to the supermarket or moving around the house and will induce dyspnea.  Plan: Multifactorial: Obesity, central obesity,  deconditioning, sedentary lifestyle, possible restrictive lung issues, possible OSA  In lieu of negative cardiac workup for dyspnea, and with a normal walk test along with essentially normal spirometry, I highly suspect the patient has deconditioning and obesity as a cause for her dyspnea. However, full PFTs were not done yet, and these will be performed prior to her next visit. I have explained to the patient if a PFT shows a  restrictive process, further workup with high-resolution CAT scan, especially if the DLCO is reduced, will be ordered. In the interim I've advised patient to start a exercise regimen, preferably water aerobics given her arthritis, uses a walker, healthy diet and 1-2 pounds per week weight loss.  Plan: - full PFTs prior to follow up.  - diet and exercise as tolerated - goal weight loss (healthy) about 1lbs\week - obtain records (ECHO and Dobutamine stress) from [facility]  - given level of right hip discomfort, use walker with daily activities  Events since last clinic visit: Patient presents today for follow-up visit of dyspnea on exertion. Since her last visit she states her level of dyspnea has not changed. Today she also had pulmonary function testing done. Patient states that she is actively trying to lose weight by diet modification. She is still using a walker for right hip issues. No complaints of cough, sputum production, fever, night sweats.   Medication:   Current Outpatient Rx  Name  Route  Sig  Dispense  Refill  . buPROPion (WELLBUTRIN XL) 300 MG 24 hr tablet   Oral   Take 1 tablet (300 mg total) by mouth every morning.   90 tablet   2   . cholestyramine (QUESTRAN) 4 GM/DOSE powder   Oral   Take 1 packet (4 g total) by mouth 2 (two) times daily with a meal. Patient taking differently: Take 4 g by mouth daily.    378 g   11   . FLUoxetine (PROZAC) 20 MG capsule   Oral   Take 1 capsule (20 mg total) by mouth 3 (three) times daily.   270 capsule   3   . glucose blood (ONE TOUCH ULTRA TEST) test strip      Use as directed once daily   100 each   5   . HYDROcodone-acetaminophen (NORCO) 10-325 MG per tablet   Oral   Take 1 tablet by mouth every 6 (six) hours as needed.         Marland Kitchen losartan (COZAAR) 25 MG tablet   Oral   Take 1 tablet (25 mg total) by mouth daily.   90 tablet   3   . omeprazole (PRILOSEC) 20 MG capsule   Oral   Take 2 capsules (40 mg  total) by mouth daily.   180 capsule   3      Review of Systems: Gen:  Denies  fever, sweats, chills HEENT: Denies blurred vision, double vision, ear pain, eye pain, hearing loss, nose bleeds, sore throat Cvc:  No dizziness, chest pain or heaviness Resp:   Admits to: Gi: Denies swallowing difficulty, stomach pain, nausea or vomiting, diarrhea, constipation, bowel incontinence Gu:  Denies bladder incontinence, burning urine Ext:   No Joint pain, stiffness or swelling Skin: No skin rash, easy bruising or bleeding or hives Endoc:  No polyuria, polydipsia , polyphagia or weight change Other:  All other systems negative  Allergies:  Ace inhibitors; Iohexol; and Tape  Physical Examination:  VS: BP 120/78 mmHg  Pulse 90  Ht 5'  1" (1.549 m)  Wt 183 lb (83.008 kg)  BMI 34.60 kg/m2  SpO2 98%  General Appearance: No distress  HEENT: PERRLA, no ptosis, no other lesions noticed Pulmonary:normal breath sounds., diaphragmatic excursion normal.No wheezing, No rales   Cardiovascular:  Normal S1,S2.  No m/r/g.     Abdomen:Exam: Benign, Soft, non-tender, No masses  Skin:   warm, no rashes, no ecchymosis  Extremities: normal, no cyanosis, clubbing, warm with normal capillary refill.     PFTs 01/24/15: Impression: No significant obstruction, mild restriction given mild decrease in RV and TLC, most likely secondary to abdominal/chest wall obesity, clinical correlation is advised; patient has normal DLCO.  Assessment and Plan: DOE (dyspnea on exertion) Multifactorial: Obesity, central obesity, deconditioning, sedentary lifestyle, possible restrictive lung issues, possible OSA  In lieu of negative cardiac workup for dyspnea, and with a normal walk test along with essentially normal spirometry, I highly suspect the patient has deconditioning and obesity as a cause for her dyspnea. Full PFTs show mild restriction secondary to chest wall/abdominal obesityand this can obstruction, nosocomial response  to bronchodilation is. Today, we again discussed an exercise program, given that she has hip and knee issues, water aerobics be her best option. I do not believe she is a candidate for pulmonary rehabilitation referral given her essentially normal pulmonary function tests, however she will contact [facility] pulmonary rehabilitation to get an external evaluation for an exercise program.  Plan: - diet and exercise as tolerated - goal weight loss (healthy) about 1lbs\week - given level of right hip discomfort, use walker with daily activities    Obesity (BMI 30-39.9) OBESITY  Discussed importance of weight reduction.  Educated regarding limitation of  intake of greasy/fried foods.  Instructed on benefit of  a low-impact exercise program, starting slowly.  Discussed benefits of 30-45 minutes of some form of exercise daily as well as benefit of supervised exercise program.         Updated Medication List Outpatient Encounter Prescriptions as of 01/24/2015  Medication Sig  . buPROPion (WELLBUTRIN XL) 300 MG 24 hr tablet Take 1 tablet (300 mg total) by mouth every morning.  . cholestyramine (QUESTRAN) 4 GM/DOSE powder Take 1 packet (4 g total) by mouth 2 (two) times daily with a meal. (Patient taking differently: Take 4 g by mouth daily. )  . FLUoxetine (PROZAC) 20 MG capsule Take 1 capsule (20 mg total) by mouth 3 (three) times daily.  Marland Kitchen glucose blood (ONE TOUCH ULTRA TEST) test strip Use as directed once daily  . HYDROcodone-acetaminophen (NORCO) 10-325 MG per tablet Take 1 tablet by mouth every 6 (six) hours as needed.  Marland Kitchen losartan (COZAAR) 25 MG tablet Take 1 tablet (25 mg total) by mouth daily.  Marland Kitchen omeprazole (PRILOSEC) 20 MG capsule Take 2 capsules (40 mg total) by mouth daily.   No facility-administered encounter medications on file as of 01/24/2015.    Orders for this visit: No orders of the defined types were placed in this encounter.    Thank  you for the visitation and for  allowing  [facility] to assist in the care of your patient. Our recommendations are noted above.  Please contact us if we can be of further service.  Vilinda Boehringer, MD [facility] Office Number: [phone]

## 2015-01-24 NOTE — Patient Instructions (Signed)
Follow up with Dr. Stevenson Clinch as needed - normal PFTS with mild restriction, secondary to body habitus - consider water aerobic as a form of exercise, this may take pressure of your hips and knees - contact [facility] on your own, to evaluate for an exercise program - diet, and weight loss.

## 2015-01-24 NOTE — Assessment & Plan Note (Signed)
OBESITY  Discussed importance of weight reduction.  Educated regarding limitation of  intake of greasy/fried foods.  Instructed on benefit of  a low-impact exercise program, starting slowly.  Discussed benefits of 30-45 minutes of some form of exercise daily as well as benefit of supervised exercise program.    

## 2015-01-24 NOTE — Progress Notes (Signed)
PFT performed today with Nitrogen washout. 

## 2015-01-24 NOTE — Assessment & Plan Note (Signed)
Multifactorial: Obesity, central obesity, deconditioning, sedentary lifestyle, possible restrictive lung issues, possible OSA  In lieu of negative cardiac workup for dyspnea, and with a normal walk test along with essentially normal spirometry, I highly suspect the patient has deconditioning and obesity as a cause for her dyspnea. Full PFTs show mild restriction secondary to chest wall/abdominal obesityand this can obstruction, nosocomial response to bronchodilation is. Today, we again discussed an exercise program, given that she has hip and knee issues, water aerobics be her best option. I do not believe she is a candidate for pulmonary rehabilitation referral given her essentially normal pulmonary function tests, however she will contact [facility] pulmonary rehabilitation to get an external evaluation for an exercise program.  Plan: - diet and exercise as tolerated - goal weight loss (healthy) about 1lbs\week - given level of right hip discomfort, use walker with daily activities

## 2015-05-01 DIAGNOSIS — H40013 Open angle with borderline findings, low risk, bilateral: Secondary | ICD-10-CM | POA: Diagnosis not present

## 2015-05-01 DIAGNOSIS — H25013 Cortical age-related cataract, bilateral: Secondary | ICD-10-CM | POA: Diagnosis not present

## 2015-05-01 DIAGNOSIS — E119 Type 2 diabetes mellitus without complications: Secondary | ICD-10-CM | POA: Diagnosis not present

## 2015-07-19 DIAGNOSIS — M545 Low back pain: Secondary | ICD-10-CM | POA: Diagnosis not present

## 2015-07-19 DIAGNOSIS — M4807 Spinal stenosis, lumbosacral region: Secondary | ICD-10-CM | POA: Diagnosis not present

## 2015-08-02 DIAGNOSIS — F330 Major depressive disorder, recurrent, mild: Secondary | ICD-10-CM | POA: Diagnosis not present

## 2015-08-02 DIAGNOSIS — F5104 Psychophysiologic insomnia: Secondary | ICD-10-CM | POA: Diagnosis not present

## 2015-08-02 DIAGNOSIS — M8580 Other specified disorders of bone density and structure, unspecified site: Secondary | ICD-10-CM | POA: Diagnosis not present

## 2015-08-02 DIAGNOSIS — M545 Low back pain: Secondary | ICD-10-CM | POA: Diagnosis not present

## 2015-10-25 DIAGNOSIS — M47816 Spondylosis without myelopathy or radiculopathy, lumbar region: Secondary | ICD-10-CM | POA: Diagnosis not present

## 2015-10-25 DIAGNOSIS — G8929 Other chronic pain: Secondary | ICD-10-CM | POA: Diagnosis not present

## 2015-10-25 DIAGNOSIS — M169 Osteoarthritis of hip, unspecified: Secondary | ICD-10-CM | POA: Diagnosis not present

## 2015-10-25 DIAGNOSIS — Z79899 Other long term (current) drug therapy: Secondary | ICD-10-CM | POA: Diagnosis not present

## 2015-11-01 DIAGNOSIS — H40012 Open angle with borderline findings, low risk, left eye: Secondary | ICD-10-CM | POA: Diagnosis not present

## 2015-11-01 DIAGNOSIS — H40011 Open angle with borderline findings, low risk, right eye: Secondary | ICD-10-CM | POA: Diagnosis not present

## 2015-11-01 DIAGNOSIS — H5000 Unspecified esotropia: Secondary | ICD-10-CM | POA: Diagnosis not present

## 2015-11-12 DIAGNOSIS — H4920 Sixth [abducent] nerve palsy, unspecified eye: Secondary | ICD-10-CM | POA: Diagnosis not present

## 2015-11-21 DIAGNOSIS — F5104 Psychophysiologic insomnia: Secondary | ICD-10-CM | POA: Diagnosis not present

## 2015-11-21 DIAGNOSIS — M47816 Spondylosis without myelopathy or radiculopathy, lumbar region: Secondary | ICD-10-CM | POA: Diagnosis not present

## 2015-11-21 DIAGNOSIS — F329 Major depressive disorder, single episode, unspecified: Secondary | ICD-10-CM | POA: Diagnosis not present

## 2015-11-21 DIAGNOSIS — G894 Chronic pain syndrome: Secondary | ICD-10-CM | POA: Diagnosis not present

## 2015-11-21 DIAGNOSIS — H4922 Sixth [abducent] nerve palsy, left eye: Secondary | ICD-10-CM | POA: Diagnosis not present

## 2015-11-21 DIAGNOSIS — M25559 Pain in unspecified hip: Secondary | ICD-10-CM | POA: Diagnosis not present

## 2015-11-21 DIAGNOSIS — M169 Osteoarthritis of hip, unspecified: Secondary | ICD-10-CM | POA: Diagnosis not present

## 2015-12-20 DIAGNOSIS — M25551 Pain in right hip: Secondary | ICD-10-CM | POA: Diagnosis not present

## 2015-12-20 DIAGNOSIS — M1611 Unilateral primary osteoarthritis, right hip: Secondary | ICD-10-CM | POA: Diagnosis not present

## 2015-12-25 DIAGNOSIS — M25559 Pain in unspecified hip: Secondary | ICD-10-CM | POA: Diagnosis not present

## 2015-12-25 DIAGNOSIS — M47816 Spondylosis without myelopathy or radiculopathy, lumbar region: Secondary | ICD-10-CM | POA: Diagnosis not present

## 2015-12-25 DIAGNOSIS — Z79891 Long term (current) use of opiate analgesic: Secondary | ICD-10-CM | POA: Diagnosis not present

## 2015-12-25 DIAGNOSIS — G894 Chronic pain syndrome: Secondary | ICD-10-CM | POA: Diagnosis not present

## 2015-12-25 DIAGNOSIS — M169 Osteoarthritis of hip, unspecified: Secondary | ICD-10-CM | POA: Diagnosis not present

## 2016-01-16 ENCOUNTER — Other Ambulatory Visit: Payer: Self-pay | Admitting: Physician Assistant

## 2016-01-16 ENCOUNTER — Other Ambulatory Visit: Payer: Self-pay

## 2016-01-17 NOTE — Patient Instructions (Addendum)
OLESYA MELLE  01/17/2016   Your procedure is scheduled on: 01/25/2016    Report to [facility] Main  Entrance take [place]  elevators to 3rd floor to  [place] at   [place] AM.  Call this number if you have problems the morning of surgery [phone]   Remember: ONLY 1 PERSON MAY GO WITH YOU TO SHORT STAY TO GET  READY MORNING OF [place].  Do not eat food or drink liquids :After Midnight.     Take these medicines the morning of surgery with A SIP OF WATER: Bupropion ( Wellbutrin), Omeprazole, Fluoxetine, Hydrocodone if needed                                 You may not have any metal on your body including hair pins and              piercings  Do not wear jewelry, make-up, lotions, powders or perfumes, deodorant             Do not wear nail polish.  Do not shave  48 hours prior to surgery.               Do not bring valuables to the hospital. Home.  Contacts, dentures or bridgework may not be worn into surgery.  Leave suitcase in the car. After surgery it may be brought to your room.       Special Instructions: coughing and deep breathing exercises, leg exercises               Please read over the following fact sheets you were given: _____________________________________________________________________             [facility] - Preparing for Surgery Before surgery, you can play an important role.  Because skin is not sterile, your skin needs to be as free of germs as possible.  You can reduce the number of germs on your skin by washing with CHG (chlorahexidine gluconate) soap before surgery.  CHG is an antiseptic cleaner which kills germs and bonds with the skin to continue killing germs even after washing. Please DO NOT use if you have an allergy to CHG or antibacterial soaps.  If your skin becomes reddened/irritated stop using the CHG and inform your nurse when you arrive at Short Stay. Do  not shave (including legs and underarms) for at least 48 hours prior to the first CHG shower.  You may shave your face/neck. Please follow these instructions carefully:  1.  Shower with CHG Soap the night before surgery and the  morning of Surgery.  2.  If you choose to wash your hair, wash your hair first as usual with your  normal  shampoo.  3.  After you shampoo, rinse your hair and body thoroughly to remove the  shampoo.                           4.  Use CHG as you would any other liquid soap.  You can apply chg directly  to the skin and wash  Gently with a scrungie or clean washcloth.  5.  Apply the CHG Soap to your body ONLY FROM THE NECK DOWN.   Do not use on face/ open                           Wound or open sores. Avoid contact with eyes, ears mouth and genitals (private parts).                       Wash face,  Genitals (private parts) with your normal soap.             6.  Wash thoroughly, paying special attention to the area where your surgery  will be performed.  7.  Thoroughly rinse your body with warm water from the neck down.  8.  DO NOT shower/wash with your normal soap after using and rinsing off  the CHG Soap.                9.  Pat yourself dry with a clean towel.            10.  Wear clean pajamas.            11.  Place clean sheets on your bed the night of your first shower and do not  sleep with pets. Day of Surgery : Do not apply any lotions/deodorants the morning of surgery.  Please wear clean clothes to the hospital/surgery center.  FAILURE TO FOLLOW THESE INSTRUCTIONS MAY RESULT IN THE CANCELLATION OF YOUR SURGERY PATIENT SIGNATURE_________________________________  NURSE SIGNATURE__________________________________  ________________________________________________________________________  WHAT IS A BLOOD TRANSFUSION? Blood Transfusion Information  A transfusion is the replacement of blood or some of its parts. Blood is made up of multiple  cells which provide different functions.  Red blood cells carry oxygen and are used for blood loss replacement.  White blood cells fight against infection.  Platelets control bleeding.  Plasma helps clot blood.  Other blood products are available for specialized needs, such as hemophilia or other clotting disorders. BEFORE THE TRANSFUSION  Who gives blood for transfusions?   Healthy volunteers who are fully evaluated to make sure their blood is safe. This is blood bank blood. Transfusion therapy is the safest it has ever been in the practice of medicine. Before blood is taken from a donor, a complete history is taken to make sure that person has no history of diseases nor engages in risky social behavior (examples are intravenous drug use or sexual activity with multiple partners). The donor's travel history is screened to minimize risk of transmitting infections, such as malaria. The donated blood is tested for signs of infectious diseases, such as HIV and hepatitis. The blood is then tested to be sure it is compatible with you in order to minimize the chance of a transfusion reaction. If you or a relative donates blood, this is often done in anticipation of surgery and is not appropriate for emergency situations. It takes many days to process the donated blood. RISKS AND COMPLICATIONS Although transfusion therapy is very safe and saves many lives, the main dangers of transfusion include:  1. Getting an infectious disease. 2. Developing a transfusion reaction. This is an allergic reaction to something in the blood you were given. Every precaution is taken to prevent this. The decision to have a blood transfusion has been considered carefully by your caregiver before blood is given. Blood is not given unless the benefits outweigh  the risks. AFTER THE TRANSFUSION  Right after receiving a blood transfusion, you will usually feel much better and more energetic. This is especially true if your red  blood cells have gotten low (anemic). The transfusion raises the level of the red blood cells which carry oxygen, and this usually causes an energy increase.  The nurse administering the transfusion will monitor you carefully for complications. HOME CARE INSTRUCTIONS  No special instructions are needed after a transfusion. You may find your energy is better. Speak with your caregiver about any limitations on activity for underlying diseases you may have. SEEK MEDICAL CARE IF:   Your condition is not improving after your transfusion.  You develop redness or irritation at the intravenous (IV) site. SEEK IMMEDIATE MEDICAL CARE IF:  Any of the following symptoms occur over the next 12 hours:  Shaking chills.  You have a temperature by mouth above 102 F (38.9 C), not controlled by medicine.  Chest, back, or muscle pain.  People around you feel you are not acting correctly or are confused.  Shortness of breath or difficulty breathing.  Dizziness and fainting.  You get a rash or develop hives.  You have a decrease in urine output.  Your urine turns a dark color or changes to pink, red, or brown. Any of the following symptoms occur over the next 10 days:  You have a temperature by mouth above 102 F (38.9 C), not controlled by medicine.  Shortness of breath.  Weakness after normal activity.  The white part of the eye turns yellow (jaundice).  You have a decrease in the amount of urine or are urinating less often.  Your urine turns a dark color or changes to pink, red, or brown. Document Released: 06/13/2000 Document Revised: 09/08/2011 Document Reviewed: 01/31/2008 ExitCare Patient Information 2014 [place].  _______________________________________________________________________  Incentive Spirometer  An incentive spirometer is a tool that can help keep your lungs clear and active. This tool measures how well you are filling your lungs with each breath. Taking  long deep breaths may help reverse or decrease the chance of developing breathing (pulmonary) problems (especially infection) following:  A long period of time when you are unable to move or be active. BEFORE THE PROCEDURE   If the spirometer includes an indicator to show your best effort, your nurse or respiratory therapist will set it to a desired goal.  If possible, sit up straight or lean slightly forward. Try not to slouch.  Hold the incentive spirometer in an upright position. INSTRUCTIONS FOR USE  3. Sit on the edge of your bed if possible, or sit up as far as you can in bed or on a chair. 4. Hold the incentive spirometer in an upright position. 5. Breathe out normally. 6. Place the mouthpiece in your mouth and seal your lips tightly around it. 7. Breathe in slowly and as deeply as possible, raising the piston or the ball toward the top of the column. 8. Hold your breath for 3-5 seconds or for as long as possible. Allow the piston or ball to fall to the bottom of the column. 9. Remove the mouthpiece from your mouth and breathe out normally. 10. Rest for a few seconds and repeat Steps 1 through 7 at least 10 times every 1-2 hours when you are awake. Take your time and take a few normal breaths between deep breaths. 11. The spirometer may include an indicator to show your best effort. Use the indicator as a goal to work  toward during each repetition. 12. After each set of 10 deep breaths, practice coughing to be sure your lungs are clear. If you have an incision (the cut made at the time of surgery), support your incision when coughing by placing a pillow or rolled up towels firmly against it. Once you are able to get out of bed, walk around indoors and cough well. You may stop using the incentive spirometer when instructed by your caregiver.  RISKS AND COMPLICATIONS  Take your time so you do not get dizzy or light-headed.  If you are in pain, you may need to take or ask for pain  medication before doing incentive spirometry. It is harder to take a deep breath if you are having pain. AFTER USE  Rest and breathe slowly and easily.  It can be helpful to keep track of a log of your progress. Your caregiver can provide you with a simple table to help with this. If you are using the spirometer at home, follow these instructions: [place] IF:   You are having difficultly using the spirometer.  You have trouble using the spirometer as often as instructed.  Your pain medication is not giving enough relief while using the spirometer.  You develop fever of 100.5 F (38.1 C) or higher. SEEK IMMEDIATE MEDICAL CARE IF:   You cough up bloody sputum that had not been present before.  You develop fever of 102 F (38.9 C) or greater.  You develop worsening pain at or near the incision site. MAKE SURE YOU:   Understand these instructions.  Will watch your condition.  Will get help right away if you are not doing well or get worse. Document Released: 10/27/2006 Document Revised: 09/08/2011 Document Reviewed: 12/28/2006 [facility] Patient Information 2014 [address] [place], [place].   ________________________________________________________________________

## 2016-01-18 ENCOUNTER — Encounter (HOSPITAL_COMMUNITY)
Admission: RE | Admit: 2016-01-18 | Discharge: 2016-01-18 | Disposition: A | Discharge status: Home / Self Care | Payer: Medicare Other | Source: Ambulatory Visit | Attending: Orthopaedic Surgery | Admitting: Orthopaedic Surgery

## 2016-01-18 ENCOUNTER — Encounter (HOSPITAL_COMMUNITY): Payer: Self-pay

## 2016-01-18 DIAGNOSIS — Z01810 Encounter for preprocedural cardiovascular examination: Secondary | ICD-10-CM | POA: Diagnosis not present

## 2016-01-18 DIAGNOSIS — M1611 Unilateral primary osteoarthritis, right hip: Secondary | ICD-10-CM | POA: Diagnosis not present

## 2016-01-18 DIAGNOSIS — Z01812 Encounter for preprocedural laboratory examination: Secondary | ICD-10-CM | POA: Insufficient documentation

## 2016-01-18 HISTORY — DX: Anxiety disorder, unspecified: F41.9

## 2016-01-18 HISTORY — DX: Anemia, unspecified: D64.9

## 2016-01-18 HISTORY — DX: Type 2 diabetes mellitus without complications: E11.9

## 2016-01-18 HISTORY — DX: Pneumonia, unspecified organism: J18.9

## 2016-01-18 HISTORY — DX: Prediabetes: R73.03

## 2016-01-18 LAB — CBC
HEMATOCRIT: 37.7 % (ref 36.0–46.0)
Hemoglobin: 11.7 g/dL — ABNORMAL LOW (ref 12.0–15.0)
MCH: 28.9 pg (ref 26.0–34.0)
MCHC: 31 g/dL (ref 30.0–36.0)
MCV: 93.1 fL (ref 78.0–100.0)
Platelets: 227 10*3/uL (ref 150–400)
RBC: 4.05 MIL/uL (ref 3.87–5.11)
RDW: 15.2 % (ref 11.5–15.5)
WBC: 7.7 10*3/uL (ref 4.0–10.5)

## 2016-01-18 LAB — COMPREHENSIVE METABOLIC PANEL
ALBUMIN: 4.2 g/dL (ref 3.5–5.0)
ALT: 49 U/L (ref 14–54)
AST: 55 U/L — AB (ref 15–41)
Alkaline Phosphatase: 59 U/L (ref 38–126)
Anion gap: 10 (ref 5–15)
BILIRUBIN TOTAL: 0.8 mg/dL (ref 0.3–1.2)
BUN: 18 mg/dL (ref 6–20)
CHLORIDE: 106 mmol/L (ref 101–111)
CO2: 23 mmol/L (ref 22–32)
Calcium: 9.3 mg/dL (ref 8.9–10.3)
Creatinine, Ser: 0.78 mg/dL (ref 0.44–1.00)
GFR calc Af Amer: >60 mL/min (ref >60)
GFR calc non Af Amer: >60 mL/min (ref >60)
GLUCOSE: 107 mg/dL — AB (ref 65–99)
POTASSIUM: 4.9 mmol/L (ref 3.5–5.1)
Sodium: 139 mmol/L (ref 135–145)
TOTAL PROTEIN: 7.4 g/dL (ref 6.5–8.1)

## 2016-01-18 LAB — SURGICAL PCR SCREEN
MRSA, PCR: NEGATIVE
Staphylococcus aureus: POSITIVE — AB

## 2016-01-18 LAB — ABO/RH: ABO/RH(D): A POS

## 2016-01-18 NOTE — Progress Notes (Signed)
11/08/14- Dr Murvin Natal office visit on chart ECHO-11/17/14- on chart  11/21/2015- LOV Dr Shelia Media on chart  CXR- 07/03/2014 on chart  EKG-07/03/14 on chart

## 2016-01-18 NOTE — Progress Notes (Signed)
Final EKG done 01/18/16- EPIC

## 2016-01-23 ENCOUNTER — Encounter (HOSPITAL_COMMUNITY): Payer: Self-pay

## 2016-01-25 ENCOUNTER — Inpatient Hospital Stay (HOSPITAL_COMMUNITY): Payer: Medicare Other | Admitting: Anesthesiology

## 2016-01-25 ENCOUNTER — Inpatient Hospital Stay (HOSPITAL_COMMUNITY): Payer: Medicare Other

## 2016-01-25 ENCOUNTER — Encounter (HOSPITAL_COMMUNITY)
Admission: RE | Disposition: A | Discharge status: Skilled Nursing Facility | Payer: Self-pay | Source: Ambulatory Visit | Attending: Orthopaedic Surgery

## 2016-01-25 ENCOUNTER — Encounter (HOSPITAL_COMMUNITY): Payer: Self-pay

## 2016-01-25 ENCOUNTER — Inpatient Hospital Stay (HOSPITAL_COMMUNITY)
Admission: RE | Admit: 2016-01-25 | Discharge: 2016-01-29 | DRG: 470 | Disposition: A | Discharge status: Skilled Nursing Facility | Payer: Medicare Other | Source: Ambulatory Visit | Attending: Orthopaedic Surgery | Admitting: Orthopaedic Surgery

## 2016-01-25 DIAGNOSIS — M1611 Unilateral primary osteoarthritis, right hip: Secondary | ICD-10-CM | POA: Diagnosis not present

## 2016-01-25 DIAGNOSIS — Z9011 Acquired absence of right breast and nipple: Secondary | ICD-10-CM | POA: Diagnosis not present

## 2016-01-25 DIAGNOSIS — E669 Obesity, unspecified: Secondary | ICD-10-CM | POA: Diagnosis present

## 2016-01-25 DIAGNOSIS — K219 Gastro-esophageal reflux disease without esophagitis: Secondary | ICD-10-CM | POA: Diagnosis present

## 2016-01-25 DIAGNOSIS — M419 Scoliosis, unspecified: Secondary | ICD-10-CM | POA: Diagnosis present

## 2016-01-25 DIAGNOSIS — F329 Major depressive disorder, single episode, unspecified: Secondary | ICD-10-CM | POA: Diagnosis present

## 2016-01-25 DIAGNOSIS — E1165 Type 2 diabetes mellitus with hyperglycemia: Secondary | ICD-10-CM | POA: Diagnosis not present

## 2016-01-25 DIAGNOSIS — E119 Type 2 diabetes mellitus without complications: Secondary | ICD-10-CM | POA: Diagnosis present

## 2016-01-25 DIAGNOSIS — Z471 Aftercare following joint replacement surgery: Secondary | ICD-10-CM | POA: Diagnosis not present

## 2016-01-25 DIAGNOSIS — M1990 Unspecified osteoarthritis, unspecified site: Secondary | ICD-10-CM | POA: Diagnosis not present

## 2016-01-25 DIAGNOSIS — Z87891 Personal history of nicotine dependence: Secondary | ICD-10-CM

## 2016-01-25 DIAGNOSIS — Z96641 Presence of right artificial hip joint: Secondary | ICD-10-CM

## 2016-01-25 DIAGNOSIS — Z853 Personal history of malignant neoplasm of breast: Secondary | ICD-10-CM

## 2016-01-25 DIAGNOSIS — I10 Essential (primary) hypertension: Secondary | ICD-10-CM | POA: Diagnosis present

## 2016-01-25 DIAGNOSIS — Z6833 Body mass index (BMI) 33.0-33.9, adult: Secondary | ICD-10-CM | POA: Diagnosis not present

## 2016-01-25 DIAGNOSIS — Z419 Encounter for procedure for purposes other than remedying health state, unspecified: Secondary | ICD-10-CM

## 2016-01-25 DIAGNOSIS — F419 Anxiety disorder, unspecified: Secondary | ICD-10-CM | POA: Diagnosis present

## 2016-01-25 DIAGNOSIS — M6281 Muscle weakness (generalized): Secondary | ICD-10-CM | POA: Diagnosis not present

## 2016-01-25 DIAGNOSIS — J449 Chronic obstructive pulmonary disease, unspecified: Secondary | ICD-10-CM | POA: Diagnosis present

## 2016-01-25 DIAGNOSIS — D62 Acute posthemorrhagic anemia: Secondary | ICD-10-CM | POA: Diagnosis not present

## 2016-01-25 DIAGNOSIS — M25551 Pain in right hip: Secondary | ICD-10-CM | POA: Diagnosis not present

## 2016-01-25 DIAGNOSIS — R2689 Other abnormalities of gait and mobility: Secondary | ICD-10-CM | POA: Diagnosis not present

## 2016-01-25 DIAGNOSIS — G894 Chronic pain syndrome: Secondary | ICD-10-CM | POA: Diagnosis not present

## 2016-01-25 DIAGNOSIS — M797 Fibromyalgia: Secondary | ICD-10-CM | POA: Diagnosis present

## 2016-01-25 DIAGNOSIS — G4700 Insomnia, unspecified: Secondary | ICD-10-CM | POA: Diagnosis not present

## 2016-01-25 DIAGNOSIS — E785 Hyperlipidemia, unspecified: Secondary | ICD-10-CM | POA: Diagnosis not present

## 2016-01-25 DIAGNOSIS — Z4789 Encounter for other orthopedic aftercare: Secondary | ICD-10-CM | POA: Diagnosis not present

## 2016-01-25 DIAGNOSIS — F331 Major depressive disorder, recurrent, moderate: Secondary | ICD-10-CM | POA: Diagnosis not present

## 2016-01-25 DIAGNOSIS — R278 Other lack of coordination: Secondary | ICD-10-CM | POA: Diagnosis not present

## 2016-01-25 HISTORY — PX: TOTAL HIP ARTHROPLASTY: SHX124

## 2016-01-25 LAB — GLUCOSE, CAPILLARY: Glucose-Capillary: 100 mg/dL — ABNORMAL HIGH (ref 65–99)

## 2016-01-25 SURGERY — ARTHROPLASTY, HIP, TOTAL, ANTERIOR APPROACH
Anesthesia: General | Site: Hip | Laterality: Right

## 2016-01-25 MED ORDER — ACETAMINOPHEN 10 MG/ML IV SOLN
INTRAVENOUS | Status: AC
  Administered 2016-01-25: 1000 mg via INTRAVENOUS
  Filled 2016-01-25: qty 100

## 2016-01-25 MED ORDER — FLUOXETINE HCL 20 MG PO CAPS
60.0000 mg | ORAL_CAPSULE | Freq: Every day | ORAL | Status: DC
  Administered 2016-01-26 – 2016-01-29 (×4): 60 mg via ORAL
  Filled 2016-01-25 (×4): qty 3

## 2016-01-25 MED ORDER — MIDAZOLAM HCL 2 MG/2ML IJ SOLN
0.5000 mg | Freq: Once | INTRAMUSCULAR | Status: AC | PRN
  Administered 2016-01-25 (×2): 0.5 mg via INTRAVENOUS

## 2016-01-25 MED ORDER — LOSARTAN POTASSIUM 50 MG PO TABS
50.0000 mg | ORAL_TABLET | Freq: Every day | ORAL | Status: DC
  Administered 2016-01-26 – 2016-01-29 (×4): 50 mg via ORAL
  Filled 2016-01-25 (×4): qty 1

## 2016-01-25 MED ORDER — HYDROMORPHONE HCL 1 MG/ML IJ SOLN
INTRAMUSCULAR | Status: AC
  Filled 2016-01-25: qty 1

## 2016-01-25 MED ORDER — HYDROMORPHONE HCL 1 MG/ML IJ SOLN
INTRAMUSCULAR | Status: DC | PRN
  Administered 2016-01-25: 0.5 mg via INTRAVENOUS
  Administered 2016-01-25: 1 mg via INTRAVENOUS
  Administered 2016-01-25: 0.5 mg via INTRAVENOUS

## 2016-01-25 MED ORDER — ALBUMIN HUMAN 5 % IV SOLN
INTRAVENOUS | Status: AC
  Filled 2016-01-25: qty 250

## 2016-01-25 MED ORDER — SUGAMMADEX SODIUM 200 MG/2ML IV SOLN
INTRAVENOUS | Status: AC
  Filled 2016-01-25: qty 2

## 2016-01-25 MED ORDER — DEXAMETHASONE SODIUM PHOSPHATE 10 MG/ML IJ SOLN
INTRAMUSCULAR | Status: AC
  Filled 2016-01-25: qty 1

## 2016-01-25 MED ORDER — FENTANYL CITRATE (PF) 100 MCG/2ML IJ SOLN
INTRAMUSCULAR | Status: AC
  Filled 2016-01-25: qty 2

## 2016-01-25 MED ORDER — TRANEXAMIC ACID 1000 MG/10ML IV SOLN
1000.0000 mg | INTRAVENOUS | Status: AC
  Administered 2016-01-25: 1000 mg via INTRAVENOUS
  Filled 2016-01-25: qty 10

## 2016-01-25 MED ORDER — ONDANSETRON HCL 4 MG/2ML IJ SOLN: 4.0000 mg | Freq: Four times a day (QID) | INTRAMUSCULAR | Status: DC | PRN

## 2016-01-25 MED ORDER — HYDROMORPHONE HCL 1 MG/ML IJ SOLN
0.2500 mg | INTRAMUSCULAR | Status: DC | PRN
  Administered 2016-01-25: 0.5 mg via INTRAVENOUS
  Administered 2016-01-25 (×4): 0.25 mg via INTRAVENOUS
  Administered 2016-01-25: 0.5 mg via INTRAVENOUS

## 2016-01-25 MED ORDER — MIDAZOLAM HCL 5 MG/5ML IJ SOLN
INTRAMUSCULAR | Status: DC | PRN
  Administered 2016-01-25: 2 mg via INTRAVENOUS

## 2016-01-25 MED ORDER — MIDAZOLAM HCL 2 MG/2ML IJ SOLN
INTRAMUSCULAR | Status: AC
  Filled 2016-01-25: qty 2

## 2016-01-25 MED ORDER — MIDAZOLAM HCL 2 MG/2ML IJ SOLN
1.0000 mg | Freq: Once | INTRAMUSCULAR | Status: AC
  Administered 2016-01-25: 1 mg via INTRAVENOUS

## 2016-01-25 MED ORDER — ONDANSETRON HCL 4 MG/2ML IJ SOLN
INTRAMUSCULAR | Status: AC
  Filled 2016-01-25: qty 2

## 2016-01-25 MED ORDER — SODIUM CHLORIDE 0.9 % IJ SOLN
INTRAMUSCULAR | Status: AC
  Filled 2016-01-25: qty 10

## 2016-01-25 MED ORDER — PHENOL 1.4 % MT LIQD: 1.0000 | OROMUCOSAL | Status: DC | PRN

## 2016-01-25 MED ORDER — OXYCODONE HCL 5 MG PO TABS
5.0000 mg | ORAL_TABLET | ORAL | Status: DC | PRN
  Administered 2016-01-25 – 2016-01-26 (×5): 10 mg via ORAL
  Filled 2016-01-25: qty 1
  Filled 2016-01-25 (×3): qty 2
  Filled 2016-01-25: qty 1
  Filled 2016-01-25: qty 2

## 2016-01-25 MED ORDER — DOCUSATE SODIUM 100 MG PO CAPS
100.0000 mg | ORAL_CAPSULE | Freq: Two times a day (BID) | ORAL | Status: DC
  Administered 2016-01-26 – 2016-01-29 (×4): 100 mg via ORAL
  Filled 2016-01-25 (×6): qty 1

## 2016-01-25 MED ORDER — MEPERIDINE HCL 50 MG/ML IJ SOLN
6.2500 mg | INTRAMUSCULAR | Status: DC | PRN
Start: 2016-01-25 — End: 2016-01-25

## 2016-01-25 MED ORDER — BUPROPION HCL ER (XL) 300 MG PO TB24
300.0000 mg | ORAL_TABLET | Freq: Every day | ORAL | Status: DC
  Administered 2016-01-26 – 2016-01-29 (×4): 300 mg via ORAL
  Filled 2016-01-25 (×4): qty 1

## 2016-01-25 MED ORDER — FENTANYL CITRATE (PF) 250 MCG/5ML IJ SOLN
INTRAMUSCULAR | Status: AC
  Filled 2016-01-25: qty 5

## 2016-01-25 MED ORDER — SODIUM CHLORIDE 0.9 % IV SOLN
INTRAVENOUS | Status: DC
  Administered 2016-01-25 – 2016-01-26 (×3): via INTRAVENOUS

## 2016-01-25 MED ORDER — CEFAZOLIN IN D5W 1 GM/50ML IV SOLN
1.0000 g | Freq: Four times a day (QID) | INTRAVENOUS | Status: AC
  Administered 2016-01-25 (×2): 1 g via INTRAVENOUS
  Filled 2016-01-25 (×2): qty 50

## 2016-01-25 MED ORDER — ACETAMINOPHEN 650 MG RE SUPP: 650.0000 mg | Freq: Four times a day (QID) | RECTAL | Status: DC | PRN

## 2016-01-25 MED ORDER — ROCURONIUM BROMIDE 100 MG/10ML IV SOLN
INTRAVENOUS | Status: AC
  Filled 2016-01-25: qty 1

## 2016-01-25 MED ORDER — LACTATED RINGERS IV SOLN
INTRAVENOUS | Status: DC
  Administered 2016-01-25: 11:00:00 via INTRAVENOUS
  Administered 2016-01-25: 1000 mL via INTRAVENOUS

## 2016-01-25 MED ORDER — ALBUMIN HUMAN 5 % IV SOLN
INTRAVENOUS | Status: DC | PRN
  Administered 2016-01-25: 12:00:00 via INTRAVENOUS

## 2016-01-25 MED ORDER — HYDROMORPHONE HCL 1 MG/ML IJ SOLN
1.0000 mg | INTRAMUSCULAR | Status: DC | PRN
  Administered 2016-01-26: 1 mg via INTRAVENOUS
  Filled 2016-01-25: qty 1

## 2016-01-25 MED ORDER — PROPOFOL 500 MG/50ML IV EMUL
INTRAVENOUS | Status: DC | PRN
  Administered 2016-01-25: 50 ug/kg/min via INTRAVENOUS

## 2016-01-25 MED ORDER — ALUM & MAG HYDROXIDE-SIMETH 200-200-20 MG/5ML PO SUSP: 30.0000 mL | ORAL | Status: DC | PRN

## 2016-01-25 MED ORDER — TEMAZEPAM 15 MG PO CAPS
30.0000 mg | ORAL_CAPSULE | Freq: Every evening | ORAL | Status: DC | PRN
  Administered 2016-01-26 – 2016-01-28 (×4): 30 mg via ORAL
  Filled 2016-01-25 (×4): qty 2

## 2016-01-25 MED ORDER — SUGAMMADEX SODIUM 200 MG/2ML IV SOLN
INTRAVENOUS | Status: DC | PRN
  Administered 2016-01-25: 200 mg via INTRAVENOUS

## 2016-01-25 MED ORDER — ASPIRIN EC 325 MG PO TBEC
325.0000 mg | DELAYED_RELEASE_TABLET | Freq: Every day | ORAL | Status: DC
  Administered 2016-01-26 – 2016-01-29 (×4): 325 mg via ORAL
  Filled 2016-01-25 (×4): qty 1

## 2016-01-25 MED ORDER — LIDOCAINE 2% (20 MG/ML) 5 ML SYRINGE
INTRAMUSCULAR | Status: DC | PRN
  Administered 2016-01-25: 25 mg via INTRAVENOUS

## 2016-01-25 MED ORDER — MENTHOL 3 MG MT LOZG: 1.0000 | LOZENGE | OROMUCOSAL | Status: DC | PRN

## 2016-01-25 MED ORDER — FENTANYL CITRATE (PF) 100 MCG/2ML IJ SOLN
INTRAMUSCULAR | Status: DC | PRN
  Administered 2016-01-25 (×7): 50 ug via INTRAVENOUS

## 2016-01-25 MED ORDER — METHOCARBAMOL 1000 MG/10ML IJ SOLN
500.0000 mg | Freq: Four times a day (QID) | INTRAVENOUS | Status: DC | PRN
  Administered 2016-01-25: 500 mg via INTRAVENOUS
  Filled 2016-01-25: qty 550
  Filled 2016-01-25: qty 5

## 2016-01-25 MED ORDER — DIPHENHYDRAMINE HCL 12.5 MG/5ML PO ELIX
12.5000 mg | ORAL_SOLUTION | ORAL | Status: DC | PRN
  Administered 2016-01-25: 25 mg via ORAL
  Filled 2016-01-25: qty 10

## 2016-01-25 MED ORDER — ACETAMINOPHEN 325 MG PO TABS
650.0000 mg | ORAL_TABLET | Freq: Four times a day (QID) | ORAL | Status: DC | PRN
  Administered 2016-01-26 – 2016-01-27 (×3): 650 mg via ORAL
  Filled 2016-01-25 (×3): qty 2

## 2016-01-25 MED ORDER — HYDROMORPHONE HCL 1 MG/ML IJ SOLN
0.2500 mg | INTRAMUSCULAR | Status: DC | PRN
  Administered 2016-01-25 (×2): 0.5 mg via INTRAVENOUS

## 2016-01-25 MED ORDER — PROMETHAZINE HCL 25 MG/ML IJ SOLN: 6.2500 mg | INTRAMUSCULAR | Status: DC | PRN

## 2016-01-25 MED ORDER — DEXAMETHASONE SODIUM PHOSPHATE 10 MG/ML IJ SOLN
INTRAMUSCULAR | Status: DC | PRN
  Administered 2016-01-25: 10 mg via INTRAVENOUS

## 2016-01-25 MED ORDER — METOCLOPRAMIDE HCL 5 MG/ML IJ SOLN: 5.0000 mg | Freq: Three times a day (TID) | INTRAMUSCULAR | Status: DC | PRN

## 2016-01-25 MED ORDER — CHLORHEXIDINE GLUCONATE 4 % EX LIQD: 60.0000 mL | Freq: Once | CUTANEOUS | Status: DC

## 2016-01-25 MED ORDER — ROCURONIUM BROMIDE 100 MG/10ML IV SOLN
INTRAVENOUS | Status: DC | PRN
  Administered 2016-01-25: 50 mg via INTRAVENOUS
  Administered 2016-01-25: 5 mg via INTRAVENOUS

## 2016-01-25 MED ORDER — CEFAZOLIN SODIUM-DEXTROSE 2-4 GM/100ML-% IV SOLN
INTRAVENOUS | Status: AC
  Filled 2016-01-25: qty 100

## 2016-01-25 MED ORDER — HYDROMORPHONE HCL 2 MG/ML IJ SOLN
INTRAMUSCULAR | Status: AC
  Filled 2016-01-25: qty 1

## 2016-01-25 MED ORDER — ONDANSETRON HCL 4 MG/2ML IJ SOLN
INTRAMUSCULAR | Status: DC | PRN
  Administered 2016-01-25: 4 mg via INTRAVENOUS

## 2016-01-25 MED ORDER — METHOCARBAMOL 500 MG PO TABS
500.0000 mg | ORAL_TABLET | Freq: Four times a day (QID) | ORAL | Status: DC | PRN
  Administered 2016-01-25 – 2016-01-26 (×2): 500 mg via ORAL
  Filled 2016-01-25 (×2): qty 1

## 2016-01-25 MED ORDER — SUCCINYLCHOLINE CHLORIDE 20 MG/ML IJ SOLN
INTRAMUSCULAR | Status: DC | PRN
  Administered 2016-01-25: 100 mg via INTRAVENOUS

## 2016-01-25 MED ORDER — CEFAZOLIN SODIUM-DEXTROSE 2-4 GM/100ML-% IV SOLN
2.0000 g | INTRAVENOUS | Status: AC
  Administered 2016-01-25: 2 g via INTRAVENOUS
  Filled 2016-01-25: qty 100

## 2016-01-25 MED ORDER — PANTOPRAZOLE SODIUM 40 MG PO TBEC
40.0000 mg | DELAYED_RELEASE_TABLET | Freq: Two times a day (BID) | ORAL | Status: DC
  Administered 2016-01-25 – 2016-01-29 (×8): 40 mg via ORAL
  Filled 2016-01-25 (×8): qty 1

## 2016-01-25 MED ORDER — METOCLOPRAMIDE HCL 5 MG PO TABS: 5.0000 mg | ORAL_TABLET | Freq: Three times a day (TID) | ORAL | Status: DC | PRN

## 2016-01-25 MED ORDER — ONDANSETRON HCL 4 MG PO TABS: 4.0000 mg | ORAL_TABLET | Freq: Four times a day (QID) | ORAL | Status: DC | PRN

## 2016-01-25 MED ORDER — PROPOFOL 10 MG/ML IV BOLUS
INTRAVENOUS | Status: DC | PRN
  Administered 2016-01-25: 10 mg via INTRAVENOUS
  Administered 2016-01-25: 20 mg via INTRAVENOUS
  Administered 2016-01-25: 180 mg via INTRAVENOUS

## 2016-01-25 MED ORDER — ACETAMINOPHEN 10 MG/ML IV SOLN: 1000.0000 mg | Freq: Four times a day (QID) | INTRAVENOUS | Status: DC

## 2016-01-25 MED ORDER — EPHEDRINE SULFATE 50 MG/ML IJ SOLN
INTRAMUSCULAR | Status: DC | PRN
  Administered 2016-01-25: 10 mg via INTRAVENOUS
  Administered 2016-01-25: 5 mg via INTRAVENOUS

## 2016-01-25 MED ORDER — ACETAMINOPHEN 10 MG/ML IV SOLN
1000.0000 mg | Freq: Once | INTRAVENOUS | Status: AC
  Administered 2016-01-25: 1000 mg via INTRAVENOUS

## 2016-01-25 MED ORDER — SODIUM CHLORIDE 0.9 % IR SOLN
Status: DC | PRN
  Administered 2016-01-25: 1000 mL

## 2016-01-25 SURGICAL SUPPLY — 38 items
APL SKNCLS STERI-STRIP NONHPOA (GAUZE/BANDAGES/DRESSINGS)
BAG SPEC THK2 15X12 ZIP CLS (MISCELLANEOUS)
BAG ZIPLOCK 12X15 (MISCELLANEOUS) IMPLANT
BENZOIN TINCTURE PRP APPL 2/3 (GAUZE/BANDAGES/DRESSINGS) IMPLANT
BLADE SAW SGTL 18X1.27X75 (BLADE) ×2 IMPLANT
BLADE SAW SGTL 18X1.27X75MM (BLADE) ×1
CAPT HIP TOTAL 2 ×2 IMPLANT
CELLS DAT CNTRL 66122 CELL SVR (MISCELLANEOUS) ×1 IMPLANT
CLOSURE WOUND 1/2 X4 (GAUZE/BANDAGES/DRESSINGS)
CLOTH BEACON ORANGE TIMEOUT ST (SAFETY) ×3 IMPLANT
DRAPE STERI IOBAN 125X83 (DRAPES) ×3 IMPLANT
DRAPE U-SHAPE 47X51 STRL (DRAPES) ×6 IMPLANT
DRSG AQUACEL AG ADV 3.5X10 (GAUZE/BANDAGES/DRESSINGS) ×3 IMPLANT
DURAPREP 26ML APPLICATOR (WOUND CARE) ×3 IMPLANT
ELECT REM PT RETURN 9FT ADLT (ELECTROSURGICAL) ×3
ELECTRODE REM PT RTRN 9FT ADLT (ELECTROSURGICAL) ×1 IMPLANT
GAUZE XEROFORM 1X8 LF (GAUZE/BANDAGES/DRESSINGS) ×3 IMPLANT
GLOVE BIO SURGEON STRL SZ7.5 (GLOVE) ×3 IMPLANT
GLOVE BIOGEL PI IND STRL 8 (GLOVE) ×2 IMPLANT
GLOVE BIOGEL PI INDICATOR 8 (GLOVE) ×4
GLOVE ECLIPSE 8.0 STRL XLNG CF (GLOVE) ×3 IMPLANT
GOWN STRL REUS W/TWL XL LVL3 (GOWN DISPOSABLE) ×6 IMPLANT
HANDPIECE INTERPULSE COAX TIP (DISPOSABLE) ×3
HOLDER FOLEY CATH W/STRAP (MISCELLANEOUS) ×3 IMPLANT
PACK ANTERIOR HIP CUSTOM (KITS) ×3 IMPLANT
RTRCTR WOUND ALEXIS 18CM MED (MISCELLANEOUS) ×3
SET HNDPC FAN SPRY TIP SCT (DISPOSABLE) ×1 IMPLANT
STAPLER VISISTAT 35W (STAPLE) IMPLANT
STRIP CLOSURE SKIN 1/2X4 (GAUZE/BANDAGES/DRESSINGS) IMPLANT
SUT ETHIBOND NAB CT1 #1 30IN (SUTURE) ×3 IMPLANT
SUT MNCRL AB 4-0 PS2 18 (SUTURE) IMPLANT
SUT VIC AB 0 CT1 36 (SUTURE) ×3 IMPLANT
SUT VIC AB 1 CT1 36 (SUTURE) ×3 IMPLANT
SUT VIC AB 2-0 CT1 27 (SUTURE) ×9
SUT VIC AB 2-0 CT1 TAPERPNT 27 (SUTURE) ×2 IMPLANT
TRAY FOLEY W/METER SILVER 14FR (SET/KITS/TRAYS/PACK) ×3 IMPLANT
TRAY FOLEY W/METER SILVER 16FR (SET/KITS/TRAYS/PACK) ×1 IMPLANT
YANKAUER SUCT BULB TIP NO VENT (SUCTIONS) ×3 IMPLANT

## 2016-01-25 NOTE — Anesthesia Procedure Notes (Signed)
Spinal  Patient location during procedure: OR Staffing Anesthesiologist: Annye Asa Resident/CRNA: Danley Danker L Performed: resident/CRNA and anesthesiologist  Preanesthetic Checklist Completed: patient identified, site marked, surgical consent, pre-op evaluation, timeout performed, IV checked, risks and benefits discussed and monitors and equipment checked Spinal Block Patient position: sitting Prep: ChloraPrep and site prepped and draped Patient monitoring: heart rate, continuous pulse ox and blood pressure Approach: midline Location: L3-4 (multiple levels) Needle Needle type: Pencan and Quincke  Needle gauge: 24 G Needle length: 9 cm Additional Notes Unable to enter CSF, multiple passes

## 2016-01-25 NOTE — Evaluation (Signed)
Physical Therapy Evaluation Patient Details Name: [name] MRN: [id number] DOB: Feb 02, 1951 Today's Date: 01/25/2016   History of Present Illness  Pt s/p R THR and with hx of DM, fibromyalgia, breast CA with R mastectomy, and anxiety  Clinical Impression  Pt s/p R THR presents with decreased R LE strength/ROM, post op pain and poor endurance limiting functional mobility.  Pt hopes to  progress to dc home HHPT follow up and intermittent family assist.    Follow Up Recommendations Home health PT    Equipment Recommendations  None recommended by PT    Recommendations for Other Services OT consult     Precautions / Restrictions Precautions Precautions: Fall Precaution Comments: Pt is impulsive Restrictions Weight Bearing Restrictions: No Other Position/Activity Restrictions: WBAT      Mobility  Bed Mobility Overal bed mobility: Needs Assistance Bed Mobility: Supine to Sit     Supine to sit: HOB elevated;Min assist;Mod assist     General bed mobility comments: cues for sequence and use of L LE to self assist  Transfers Overall transfer level: Needs assistance Equipment used: Rolling walker (2 wheeled) Transfers: Sit to/from Stand Sit to Stand: Min assist;Mod assist;+2 safety/equipment         General transfer comment: cues for LE management and use of UEs to self assist  Ambulation/Gait Ambulation/Gait assistance: Min assist;Mod assist;+2 physical assistance;+2 safety/equipment Ambulation Distance (Feet): 8 Feet Assistive device: Rolling walker (2 wheeled) Gait Pattern/deviations: Step-to pattern;Decreased step length - right;Decreased step length - left;Shuffle;Trunk flexed Gait velocity: decr Gait velocity interpretation: Below normal speed for age/gender General Gait Details: cues for sequence, saftey awareness, posture, position from RW and UE WB.  Stairs            Wheelchair Mobility    Modified Rankin (Stroke Patients Only)       Balance  Overall balance assessment: Needs assistance Sitting-balance support: No upper extremity supported;Feet supported Sitting balance-Leahy Scale: Good     Standing balance support: Bilateral upper extremity supported Standing balance-Leahy Scale: Poor                               Pertinent Vitals/Pain Pain Assessment: 0-10 Pain Score: 6  Pain Location: R hip Pain Descriptors / Indicators: Aching;Sore Pain Intervention(s): Limited activity within patient's tolerance;Monitored during session;Premedicated before session;Ice applied    Home Living Family/patient expects to be discharged to:: Private residence Living Arrangements: Alone Available Help at Discharge: Family;Available PRN/intermittently Type of Home: House Home Access: Stairs to enter   Entrance Stairs-Number of Steps: 1+1 Home Layout: One level Home Equipment: Walker - 2 wheels;Cane - single point      Prior Function Level of Independence: Independent;Independent with assistive device(s)               Hand Dominance        Extremity/Trunk Assessment   Upper Extremity Assessment: Overall WFL for tasks assessed           Lower Extremity Assessment: RLE deficits/detail         Communication   Communication: No difficulties  Cognition Arousal/Alertness: Awake/alert Behavior During Therapy: Impulsive Overall Cognitive Status: Within Functional Limits for tasks assessed                      General Comments      Exercises Total Joint Exercises Ankle Circles/Pumps: AROM;Both;15 reps;Supine      Assessment/Plan    PT  Assessment Patient needs continued PT services  PT Diagnosis Difficulty walking   PT Problem List Decreased strength;Decreased range of motion;Decreased activity tolerance;Decreased balance;Decreased mobility;Decreased cognition;Decreased knowledge of use of DME;Obesity;Pain  PT Treatment Interventions DME instruction;Gait training;Stair training;Functional  mobility training;Therapeutic activities;Therapeutic exercise;Patient/family education   PT Goals (Current goals can be found in the Care Plan section) Acute Rehab PT Goals Patient Stated Goal: HOME  PT Goal Formulation: With patient Time For Goal Achievement: 01/28/16 Potential to Achieve Goals: Good    Frequency 7X/week   Barriers to discharge Decreased caregiver support Pt lives alone with sister who will "stop by"     Co-evaluation               End of Session Equipment Utilized During Treatment: Gait belt Activity Tolerance: Patient limited by fatigue;Patient limited by pain Patient left: in chair;with call bell/phone within reach;with family/visitor present Nurse Communication: Mobility status         Time: [id number] PT Time Calculation (min) (ACUTE ONLY): 24 min   Charges:   PT Evaluation $PT Eval Low Complexity: 1 Procedure PT Treatments $Gait Training: 8-22 mins   PT G Codes:        [name] 01/31/2016, 6:09 PM

## 2016-01-25 NOTE — Transfer of Care (Signed)
Immediate Anesthesia Transfer of Care Note  Patient: [name]  Procedure(s) Performed: Procedure(s): RIGHT TOTAL HIP ARTHROPLASTY ANTERIOR APPROACH (Right)  Patient Location: PACU  Anesthesia Type:General  Level of Consciousness: awake, alert  and oriented  Airway & Oxygen Therapy: Patient Spontanous Breathing and Patient connected to face mask oxygen  Post-op Assessment: Report given to RN and Post -op Vital signs reviewed and stable  Post vital signs: Reviewed and stable  Last Vitals:  Vitals:   01/25/16 0750  BP: 115/75  Pulse: 82  Resp: 16  Temp: 36.7 C    Last Pain:  Vitals:   01/25/16 0750  TempSrc: Oral      Patients Stated Pain Goal: 4 ([id number])  Complications: No apparent anesthesia complications

## 2016-01-25 NOTE — Anesthesia Procedure Notes (Signed)
Procedure Name: Intubation Date/Time: 01/25/2016 10:30 AM Performed by: Danley Danker L Patient Re-evaluated:Patient Re-evaluated prior to inductionOxygen Delivery Method: Circle system utilized Preoxygenation: Pre-oxygenation with 100% oxygen Intubation Type: IV induction Ventilation: Mask ventilation without difficulty Laryngoscope Size: Miller and 2 Grade View: Grade II Tube type: Oral Tube size: 7.5 mm Number of attempts: 1 Airway Equipment and Method: Stylet Placement Confirmation: positive ETCO2,  ETT inserted through vocal cords under direct vision and breath sounds checked- equal and bilateral Secured at: 21 cm Tube secured with: Tape Dental Injury: Teeth and Oropharynx as per pre-operative assessment

## 2016-01-25 NOTE — Anesthesia Postprocedure Evaluation (Signed)
Anesthesia Post Note  Patient: [name]  Procedure(s) Performed: Procedure(s) (LRB): RIGHT TOTAL HIP ARTHROPLASTY ANTERIOR APPROACH (Right)  Patient location during evaluation: PACU Anesthesia Type: General Level of consciousness: awake and alert, oriented and patient cooperative Pain management: pain level controlled (pain much improved) Vital Signs Assessment: post-procedure vital signs reviewed and stable Respiratory status: spontaneous breathing, nonlabored ventilation, respiratory function stable and patient connected to nasal cannula oxygen Cardiovascular status: blood pressure returned to baseline and stable Postop Assessment: no signs of nausea or vomiting Anesthetic complications: no    Last Vitals:  Vitals:   01/25/16 1415 01/25/16 1430  BP: 132/79 131/74  Pulse: 97   Resp: 15 14  Temp: 36.4 C 36.8 C    Last Pain:  Vitals:   01/25/16 1415  TempSrc:   PainSc: 3                  Mckinnon Glick,E. Stephan Draughn

## 2016-01-25 NOTE — H&P (Signed)
TOTAL HIP ADMISSION H&P  Patient is admitted for right total hip arthroplasty.  Subjective:  Chief Complaint: right hip pain  HPI: [name], 65 y.o. female, has a history of pain and functional disability in the right hip(s) due to arthritis and patient has failed non-surgical conservative treatments for greater than 12 weeks to include NSAID's and/or analgesics, corticosteriod injections, flexibility and strengthening excercises, use of assistive devices, weight reduction as appropriate and activity modification.  Onset of symptoms was gradual starting 5 years ago with gradually worsening course since that time.The patient noted no past surgery on the right hip(s).  Patient currently rates pain in the right hip at 10 out of 10 with activity. Patient has night pain, worsening of pain with activity and weight bearing, trendelenberg gait, pain that interfers with activities of daily living, pain with passive range of motion and crepitus. Patient has evidence of subchondral cysts, subchondral sclerosis, periarticular osteophytes and joint space narrowing by imaging studies. This condition presents safety issues increasing the risk of falls.  There is no current active infection.  Patient Active Problem List   Diagnosis Date Noted  . Osteoarthritis of right hip 01/25/2016  . Cough 05/17/2014  . Lower back pain 04/19/2013  . Obesity (BMI 30-39.9) 06/14/2012  . Acute cholecystitis with chronic cholecystitis 06/04/2012  . 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 Chronic diarrhea 06/04/2012  . Poor venous access 06/04/2012  . Hemangioma of liver - left hepatic lobe 06/04/2012  . Right hip pain 09/12/2011  . History of detached retina repair 02/12/2011  . Preventative health care 02/08/2011  . INSOMNIA-SLEEP DISORDER-UNSPEC 11/21/2009  . DOE (dyspnea on exertion) 11/21/2009  . Essential hypertension 04/30/2008  . GERD 04/30/2008  . EPISTAXIS, RECURRENT 01/27/2008  . KNEE SPRAIN,  ACUTE 01/27/2008  . FIBROMYALGIA 01/13/2008  . LEG PAIN, LEFT 01/13/2008  . ARTHRITIS 10/25/2007  . OSTEOPENIA 07/26/2007  . HIP PAIN, RIGHT 04/30/2007  . HEPATITIS C 08/03/2006  . Diabetes ([place]) 08/03/2006  . DEPRESSION 08/03/2006  . BREAST CANCER, HX OF 08/03/2006   Past Medical History:  Diagnosis Date  . Anemia    2016 on iron for a while   . Anxiety   . ARTHRITIS 10/25/2007  . Arthritis   . Blood transfusion without reported diagnosis   . BREAST CANCER, HX OF 08/03/2006  . Cancer ([place])    hx of breast ca x 2 on right   . DEPRESSION 08/03/2006  . Diabetes ([place])    prediabetic   . DIABETES MELLITUS, TYPE II 08/03/2006  . DYSPNEA ON EXERTION 11/21/2009   shortness of breath with exertion   . EPISTAXIS, RECURRENT 01/27/2008  . FIBROMYALGIA 01/13/2008  . GERD 04/30/2008  . HEPATITIS C 08/03/2006  . HIP PAIN, RIGHT 04/30/2007  . History of detached retina repair 02/12/2011  . HYPERTENSION 04/30/2008  . INSOMNIA-SLEEP DISORDER-UNSPEC 11/21/2009  . KNEE SPRAIN, ACUTE 01/27/2008  . LEG PAIN, LEFT 01/13/2008  . OSTEOPENIA 07/26/2007  . Persistent vomiting 08/17/2008  . Pneumonia    hx of   . Prediabetes     Past Surgical History:  Procedure Laterality Date  . ABDOMINAL HYSTERECTOMY    . BREAST LUMPECTOMY  2000   right with axillary LND  . CHOLECYSTECTOMY  06/05/2012   Procedure: LAPAROSCOPIC CHOLECYSTECTOMY;  Surgeon: Adin Hector, MD;  Location: [facility];  Service: General;  Laterality: N/A;  . LAPAROSCOPIC LYSIS OF ADHESIONS  06/05/2012   Procedure: LAPAROSCOPIC LYSIS OF ADHESIONS;  Surgeon: Adin Hector, MD;  Location: [facility];  Service: General;  Laterality: N/A;  . MASTECTOMY  2007   Dr. Margot Chimes  . s/p right broken leg with surgury as teen    . s/p thyroid FNA neg.    . TONSILLECTOMY    . TUBAL LIGATION      No prescriptions prior to admission.   Allergies  Allergen Reactions  . Ace Inhibitors Cough  . Iohexol      Desc: Pt has been pre medicated with prednisone before  scans, pt is unsure of type of contrast she reacted to.   . Tape Rash    "Tears my skin all up"    Social History  Substance Use Topics  . Smoking status: Former Smoker    Packs/day: 4.00    Years: 40.00    Types: Cigarettes    Quit date: 06/30/2005  . Smokeless tobacco: Never Used     Comment: Pt smoked up to 4 ppd  . Alcohol use 0.0 oz/week     Comment: rare    Family History  Problem Relation Age of Onset  . Heart disease Mother   . Cancer Sister     breast     Review of Systems  Musculoskeletal: Positive for joint pain.  All other systems reviewed and are negative.   Objective:  Physical Exam  Constitutional: She is oriented to person, place, and time. She appears well-developed and well-nourished.  HENT:  Head: Normocephalic and atraumatic.  Eyes: EOM are normal. Pupils are equal, round, and reactive to light.  Neck: Normal range of motion. Neck supple.  Cardiovascular: Normal rate and regular rhythm.   Respiratory: Effort normal and breath sounds normal.  GI: Soft. Bowel sounds are normal.  Musculoskeletal:       Right hip: She exhibits decreased range of motion, decreased strength, tenderness and crepitus.  Neurological: She is alert and oriented to person, place, and time.  Skin: Skin is warm and dry.  Psychiatric: She has a normal mood and affect.    Vital signs in last 24 hours:    Labs:   Estimated body mass index is 33.11 kg/m as calculated from the following:   Height as of 01/18/16: 5\' 2"  (1.575 m).   Weight as of 01/18/16: 82.1 kg (181 lb).   Imaging Review Plain radiographs demonstrate severe degenerative joint disease of the right hip(s). The bone quality appears to be good for age and reported activity level.  Assessment/Plan:  End stage arthritis, right hip(s)  The patient history, physical examination, clinical judgement of the provider and imaging studies are consistent with end stage degenerative joint disease of the right hip(s) and  total hip arthroplasty is deemed medically necessary. The treatment options including medical management, injection therapy, arthroscopy and arthroplasty were discussed at length. The risks and benefits of total hip arthroplasty were presented and reviewed. The risks due to aseptic loosening, infection, stiffness, dislocation/subluxation,  thromboembolic complications and other imponderables were discussed.  The patient acknowledged the explanation, agreed to proceed with the plan and consent was signed. Patient is being admitted for inpatient treatment for surgery, pain control, PT, OT, prophylactic antibiotics, VTE prophylaxis, progressive ambulation and ADL's and discharge planning.The patient is planning to be discharged home with home health services

## 2016-01-25 NOTE — Brief Op Note (Signed)
01/25/2016  12:02 PM  PATIENT:  [name]  65 y.o. female  PRE-OPERATIVE DIAGNOSIS:  severe osteoarthritis right hip  POST-OPERATIVE DIAGNOSIS:  severe osteoarthritis right hip  PROCEDURE:  Procedure(s): RIGHT TOTAL HIP ARTHROPLASTY ANTERIOR APPROACH (Right)  SURGEON:  Surgeon(s) and Role:    * Mcarthur Rossetti, MD - Primary  PHYSICIAN ASSISTANT: Benita Stabile, PA-C  ANESTHESIA:   general  (attempted spinal)  EBL:  Total I/O In: 2250 [I.V.:2000; IV Piggyback:250] Out: [id number] [Urine:450; Blood:1000]  EBL:  500-600 per surgeon  COUNTS:  YES  DICTATION: .Other Dictation: Dictation Number [id number]  PLAN OF CARE: Admit to inpatient   PATIENT DISPOSITION:  PACU - hemodynamically stable.   Delay start of Pharmacological VTE agent (>24hrs) due to surgical blood loss or risk of bleeding: no

## 2016-01-25 NOTE — Anesthesia Preprocedure Evaluation (Signed)
Anesthesia Evaluation  Patient identified by MRN, date of birth, ID band Patient awake    Reviewed: Allergy & Precautions, NPO status , Patient's Chart, lab work & pertinent test results  Airway Mallampati: II  TM Distance: >3 FB Neck ROM: Full    Dental  (+) Dental Advisory Given   Pulmonary COPD, former smoker (quit 2007),    breath sounds clear to auscultation       Cardiovascular hypertension, Pt. on medications (-) angina+ DOE   Rhythm:Regular Rate:Normal     Neuro/Psych Anxiety Depression negative neurological ROS     GI/Hepatic GERD  Medicated and Controlled,(+) Hepatitis -, C  Endo/Other  diabetes (glu 100, diet controlled)  Renal/GU      Musculoskeletal  (+) Arthritis , Osteoarthritis,  Fibromyalgia -  Abdominal (+) + obese,   Peds  Hematology negative hematology ROS (+)   Anesthesia Other Findings Breast cancer  Reproductive/Obstetrics                            Anesthesia Physical Anesthesia Plan  ASA: III  Anesthesia Plan: Spinal   Post-op Pain Management:    Induction:   Airway Management Planned: Natural Airway and Simple Face Mask  Additional Equipment:   Intra-op Plan:   Post-operative Plan:   Informed Consent: I have reviewed the patients History and Physical, chart, labs and discussed the procedure including the risks, benefits and alternatives for the proposed anesthesia with the patient or authorized representative who has indicated his/her understanding and acceptance.   Dental advisory given  Plan Discussed with: CRNA and Surgeon  Anesthesia Plan Comments: (Plan routine monitors, SAB)        Anesthesia Quick Evaluation

## 2016-01-26 LAB — CBC
HEMATOCRIT: 22.8 % — AB (ref 36.0–46.0)
Hemoglobin: 7.3 g/dL — ABNORMAL LOW (ref 12.0–15.0)
MCH: 29.7 pg (ref 26.0–34.0)
MCHC: 32 g/dL (ref 30.0–36.0)
MCV: 92.7 fL (ref 78.0–100.0)
Platelets: 179 10*3/uL (ref 150–400)
RBC: 2.46 MIL/uL — AB (ref 3.87–5.11)
RDW: 16.1 % — AB (ref 11.5–15.5)
WBC: 9.4 10*3/uL (ref 4.0–10.5)

## 2016-01-26 LAB — BASIC METABOLIC PANEL
ANION GAP: 7 (ref 5–15)
BUN: 10 mg/dL (ref 6–20)
CALCIUM: 8 mg/dL — AB (ref 8.9–10.3)
CO2: 22 mmol/L (ref 22–32)
Chloride: 106 mmol/L (ref 101–111)
Creatinine, Ser: 0.62 mg/dL (ref 0.44–1.00)
Glucose, Bld: 131 mg/dL — ABNORMAL HIGH (ref 65–99)
POTASSIUM: 4.1 mmol/L (ref 3.5–5.1)
Sodium: 135 mmol/L (ref 135–145)

## 2016-01-26 LAB — PREPARE RBC (CROSSMATCH)

## 2016-01-26 MED ORDER — CYCLOBENZAPRINE HCL 10 MG PO TABS
10.0000 mg | ORAL_TABLET | Freq: Three times a day (TID) | ORAL | Status: DC | PRN
  Administered 2016-01-26 – 2016-01-29 (×8): 10 mg via ORAL
  Filled 2016-01-26 (×8): qty 1

## 2016-01-26 MED ORDER — OXYCODONE HCL 5 MG PO TABS
5.0000 mg | ORAL_TABLET | ORAL | Status: DC | PRN
  Administered 2016-01-26 – 2016-01-29 (×20): 20 mg via ORAL
  Filled 2016-01-26 (×20): qty 4

## 2016-01-26 MED ORDER — ACETAMINOPHEN 325 MG PO TABS
650.0000 mg | ORAL_TABLET | Freq: Once | ORAL | Status: AC
  Administered 2016-01-26: 650 mg via ORAL
  Filled 2016-01-26: qty 2

## 2016-01-26 MED ORDER — SODIUM CHLORIDE 0.9 % IV SOLN: Freq: Once | INTRAVENOUS | Status: DC

## 2016-01-26 MED ORDER — DIPHENHYDRAMINE HCL 25 MG PO CAPS
25.0000 mg | ORAL_CAPSULE | Freq: Once | ORAL | Status: AC
  Administered 2016-01-26: 25 mg via ORAL
  Filled 2016-01-26: qty 1

## 2016-01-26 NOTE — Op Note (Signed)
Meghan, Klein NO.:  [id number]  MEDICAL RECORD NO.:  [id number]  LOCATION:  [id number]                         FACILITY:  [facility]  PHYSICIAN:  Lind Guest. Ninfa Linden, M.D.DATE OF BIRTH:  [birth year]  DATE OF PROCEDURE:  01/25/2016 DATE OF DISCHARGE:                              OPERATIVE REPORT   PREOPERATIVE DIAGNOSIS:  End-stage primary osteoarthritis and degenerative joint disease, right hip.  POSTOPERATIVE DIAGNOSIS:  End-stage primary osteoarthritis and degenerative joint disease, right hip.  PROCEDURE:  Right total hip arthroplasty through direct anterior approach.  IMPLANTS:  DePuy Sector Gription acetabular component size 50 with a single screw, size 32+ 0 neutral polyethylene liner, size 12 [place] femoral component with standard offset, size 32+ 1 ceramic hip ball.  SURGEON:  Lind Guest. Ninfa Linden, M.D.  ASSISTANT:  Erskine Emery, PA-C.  ANESTHESIA:  General.  ANTIBIOTICS:  2 g of IV Ancef.  BLOOD LOSS:  Per anesthesia, 1 liter, but per surgeon, 500-600 mL.  COMPLICATIONS:  None.  INDICATIONS:  [name] is a 65 year old patient well known to me. She has debilitating hip pain involving her right hip and x-rays that show complete loss of her right hip joint space to significant periarticular osteophytes, complete loss of the joint space and severe cystic changes on both the acetabular and femoral head side.  The leg is significantly shorter than her other side as well plus she has lumbar scoliosis.  This has created a large to her gait and significant problems with her hip.  She has tried and failed all forms of conservative treatment, and at this point, due to her daily pain, her decreased mobility and her decreased quality of life, she wished to proceed with a total hip arthroplasty with our goals of decreased pain, improved mobility and overall improved quality of life.  She understands the risk of acute blood loss anemia, nerve and  vessel injury, fracture, infection, dislocation, DVT.  PROCEDURE DESCRIPTION:  After informed consent was obtained, appropriate right hip was marked.  She was brought to the operating room and spinal anesthesia was attempted, but was unsuccessful.  This was likely due to the nature of her spine, degenerative disk disease and lumbar scoliosis. We then laid in a supine position and general anesthesia was then obtained.  A Foley catheter was placed and both feet had traction boots applied to them.  I did assess her leg length and showed even though her x-rays showing her leg length is long, she was actually short on that side.  We placed her supine on the Hana fracture table with the perineal post in place and both legs in inline skeletal traction devices, but no traction applied.  Her right operative hip was prepped and draped with DuraPrep and sterile drapes.  Time-out was called.  She was identified as correct patient and correct right hip.  We then made an incision inferior and posterior to the anterior superior iliac spine and carried this obliquely down the leg.  We dissected down the tensor fascia lata muscle and tensor fascia was then divided longitudinally, so we could proceed with a direct anterior approach to the hip.  We identified and cauterized the circumflex  vessels and identified the hip capsule.  I opened up the hip capsule and finding a large joint effusion and significant periarticular osteophytes.  We made our femoral neck cut proximal to the lesser trochanter and completed this with an osteotome. I placed a corkscrew guide in the femoral head and removed the femoral head in its entirety and found it to be completely devoid of cartilage and evidence of collapsing.  We then placed a bent Hohmann over the medial acetabular rim and cleaned the acetabulum and remnants of the acetabular labrum and other debris.  We then began reaming under direct visualization from a size 42  reamer up to a size 50 with all reamers under direct visualization and the last reamer under direct fluoroscopy, so we could obtain our depth of reaming, our inclination and anteversion.  Once we were pleased with this, we placed the real DePuy Sector Gription acetabular component, size 50 and a single screw.  We then placed a 32+ 0 neutral polyethylene liner for that size acetabular component.  We then went to the femur.  With the leg externally rotated to 120 degrees, extended and adducted.  We were able to place a Mueller retractor medially and released the lateral joint capsule and placed the Hohmann retractor laterally behind the greater trochanter.  We used a rongeur to lateralize and a box cutting osteotome to enter the femoral canal.  We then broached from a size 8 broach up to a size 12.  With the size 12 in place, we trialed a standard offset femoral neck and 32+ 0 hip ball.  We brought the leg back over and up with traction and internal rotation reducing the pelvis and was stable and her leg lengths measured, she was long on this side.  However, we were matching her preoperative films, which showed she was long even though clinically she is much short.  I was pleased with stability and offset, so we dislocated the hip and removed the trial components.  Then, we placed the real size 12 femoral component and the real 32+ 1 ceramic hip ball and reduced this from the acetabulum.  We then irrigated the soft tissue with normal saline solution using pulsatile lavage.  We were able to close the joint capsule with interrupted #1 Ethibond suture followed by running #1 Vicryl in the tensor fascia, 0 Vicryl in the deep tissue, 2-0 Vicryl in the subcutaneous tissue and interrupted staples on the skin. Xeroform and Aquacel dressing were applied.  She was taken off the Hana table, awakened, extubated, and taken to the recovery room in stable condition.  Of note, Erskine Emery, PA-C, assisted  in the entire case, his assistance was crucial for facilitating all aspects of this case.     Lind Guest. Ninfa Linden, M.D.     CYB/MEDQ  D:  01/25/2016  T:  01/26/2016  Job:  [id number]

## 2016-01-26 NOTE — Progress Notes (Addendum)
   01/26/16 1300  PT Visit Information  Last PT Received On 01/26/16  *pt is progressing very SLOWLY--may need SNF/SW CONSULT  Assistance Needed +2  History of Present Illness Pt s/p R THR and with hx of DM, fibromyalgia, breast CA with R mastectomy, and anxiety  Subjective Data  Subjective I just had  pain medicine  Patient Stated Goal HOME   Precautions  Precautions Fall  Restrictions  Other Position/Activity Restrictions WBAT  Pain Assessment  Pain Assessment 0-10  Pain Score 7  Pain Location R hip  Pain Descriptors / Indicators Aching;Grimacing;Guarding;Sore  Pain Intervention(s) Limited activity within patient's tolerance;Monitored during session  Cognition  Arousal/Alertness Awake/alert  Behavior During Therapy Anxious  Overall Cognitive Status Within Functional Limits for tasks assessed  Bed Mobility  Overal bed mobility Needs Assistance  Bed Mobility Supine to Sit  Supine to sit +2 for physical assistance;Mod assist;+2 for safety/equipment  General bed mobility comments cues for sequence and use of L LE to self assist  Transfers  Overall transfer level Needs assistance  Equipment used Rolling walker (2 wheeled)  Transfers Sit to/from [employer] Transfers  Sit to Stand Min assist;Mod assist;+2 safety/equipment  Stand pivot transfers Min assist;Mod assist;+2 safety/equipment  General transfer comment cues for LE management, safety awareness and use of UEs to self assist, pt requires incr time   Ambulation/Gait  General Gait Details (pt unable to tol)  Gait velocity decr  PT - End of Session  Activity Tolerance Patient limited by pain;Patient limited by fatigue  Patient left in chair;with call bell/phone within reach;with chair alarm set  Nurse Communication Mobility status  PT - Assessment/Plan  PT Plan Current plan remains appropriate;Discharge plan needs to be updated  PT Frequency (ACUTE ONLY) 7X/week  Follow Up Recommendations Supervision/Assistance - 24  hour;SNF  PT equipment None recommended by PT  PT Goal Progression  Progress towards PT goals Progressing toward goals (VERY SLOWLY)  Acute Rehab PT Goals  PT Goal Formulation With patient  Time For Goal Achievement 01/28/16  Potential to Achieve Goals Good  PT Time Calculation  PT Start Time (ACUTE ONLY) 1311  PT Stop Time (ACUTE ONLY) 1326  PT Time Calculation (min) (ACUTE ONLY) 15 min

## 2016-01-26 NOTE — Progress Notes (Signed)
Subjective: 1 Day Post-Op Procedure(s) (LRB): RIGHT TOTAL HIP ARTHROPLASTY ANTERIOR APPROACH (Right) Patient reports pain as moderate.  Considerable spasms and low back pain.  Also receiving 2 units of blood to treat acute blood loss anemia from surgery  Objective: Vital signs in last 24 hours: Temp:  [97 F (36.1 C)-100 F (37.8 C)] 99.2 F (37.3 C) (07/29 0920) Pulse Rate:  [86-104] 99 (07/29 0920) Resp:  [11-19] 16 (07/29 0920) BP: (101-143)/(49-86) 101/49 (07/29 0920) SpO2:  [96 %-100 %] 100 % (07/29 0920) Weight:  [82.1 kg (181 lb)] 82.1 kg (181 lb) (07/28 1440)  Intake/Output from previous day: 07/28 0701 - 07/29 0700 In: 5437.5 [P.O.:1020; I.V.:3962.5; IV Piggyback:455] Out: [id number] [Urine:2755; Blood:1000] Intake/Output this shift: Total I/O In: 250 [I.V.:250] Out: -    Recent Labs  01/26/16 0401  HGB 7.3*    Recent Labs  01/26/16 0401  WBC 9.4  RBC 2.46*  HCT 22.8*  PLT 179    Recent Labs  01/26/16 0401  NA 135  K 4.1  CL 106  CO2 22  BUN 10  CREATININE 0.62  GLUCOSE 131*  CALCIUM 8.0*   No results for input(s): LABPT, INR in the last 72 hours.  Sensation intact distally Intact pulses distally Dorsiflexion/Plantar flexion intact Incision: scant drainage Compartment soft  Assessment/Plan: 1 Day Post-Op Procedure(s) (LRB): RIGHT TOTAL HIP ARTHROPLASTY ANTERIOR APPROACH (Right) Up with therapy  Start flexeril and increase oxycodone Check CBC in am tomorrow  [name] 01/26/2016, 9:34 AM

## 2016-01-26 NOTE — Care Management Note (Signed)
Case Management Note  Patient Details  Name: [name] MRN: [id number] Date of Birth: 17-Jan-1951  Subjective/Objective:      s/p R THR               Action/Plan: Discharge Planning:   Spoke to pt and offered choice for [facility]. Pt agreeable to [facility] for [facility]. Pt requesting RW and 3n1 for home. Contacted [facility] for DME for home.    Expected Discharge Date:     01/28/2016            Expected Discharge Plan:  [place]  In-House Referral:  NA  Discharge planning Services  CM Consult  Post Acute Care Choice:  Home Health Choice offered to:  Patient  DME Arranged:  3-N-1, Walker rolling DME Agency:  [place]:  PT [place]:  [facility] (now [facility])  Status of Service:  Completed, signed off  If discussed at [employer] of Stay Meetings, dates discussed:    Additional Comments:  Erenest Rasher, RN 01/26/2016, 3:39 PM

## 2016-01-26 NOTE — Progress Notes (Signed)
Physical Therapy Treatment Patient Details Name: [name] MRN: [id number] DOB: [birth year] Today's Date: 2016-02-25    History of Present Illness Pt s/p R THR and with hx of DM, fibromyalgia, breast CA with R mastectomy, and anxiety    PT Comments    Pt limited by pain; states her pain has been out of control for sometime and Dr. Ninfa Linden just adjusted it so it is beginning to get better; will attempt OOB in pm  Follow Up Recommendations  Home health PT     Equipment Recommendations  None recommended by PT    Recommendations for Other Services       Precautions / Restrictions Precautions Precautions: Fall Precaution Comments: Pt is impulsive Restrictions Weight Bearing Restrictions: No Other Position/Activity Restrictions: WBAT    Mobility  Bed Mobility               General bed mobility comments:  (OOB deferred d/t pt just starting blood, Hgb 7.3)  Transfers                    Ambulation/Gait                 Stairs            Wheelchair Mobility    Modified Rankin (Stroke Patients Only)       Balance                                    Cognition Arousal/Alertness: Awake/alert Behavior During Therapy: Anxious Overall Cognitive Status: Within Functional Limits for tasks assessed                      Exercises Total Joint Exercises Ankle Circles/Pumps: AROM;Both;15 reps;Supine Quad Sets: AROM;Strengthening;Both;10 reps Short Arc QuadSinclair Ship;Right;10 reps Heel Slides: AAROM;Right;10 reps Hip ABduction/ADduction: AAROM;Right;10 reps    General Comments        Pertinent Vitals/Pain Pain Assessment: 0-10 Pain Score: 7  Pain Location: R hip Pain Descriptors / Indicators: Aching Pain Intervention(s): Limited activity within patient's tolerance;Monitored during session;Premedicated before session;Repositioned;Ice applied    Home Living                      Prior Function             PT Goals (current goals can now be found in the care plan section) Acute Rehab PT Goals Patient Stated Goal: HOME  PT Goal Formulation: With patient Time For Goal Achievement: 01/28/16 Potential to Achieve Goals: Good Progress towards PT goals: Progressing toward goals    Frequency  7X/week    PT Plan Current plan remains appropriate    Co-evaluation             End of Session Equipment Utilized During Treatment: Gait belt Activity Tolerance: Patient limited by pain Patient left: in bed;with call bell/phone within reach;with bed alarm set;with family/visitor present     Time: [id number] PT Time Calculation (min) (ACUTE ONLY): 11 min  Charges:  $Therapeutic Exercise: 8-22 mins                    G Codes:      Dallin Mccorkel February 25, 2016, 10:19 AM

## 2016-01-26 NOTE — Progress Notes (Signed)
Occupational Therapy Evaluation Patient Details Name: CLARIA GIARRUSSO MRN: [id number] DOB: May 27, 1951 Today's Date: 01/26/2016    History of Present Illness Pt s/p R THR and with hx of DM, fibromyalgia, breast CA with R mastectomy, and anxiety   Clinical Impression   PTA, pt independent with ADL and mobility. Pt currently requires +2 Mod A with mobility @ RW level and max A with LB ADL. Pt anxious and agitated at times during session. Only tolerated sitting in chair @ 40 min before requesting to get back to bed. Pt not safe to D/C home and will benefit form rehab at SNF to facilitate safe return home at [facility]. Will follow acutely to address established goals.     Follow Up Recommendations  SNF;Supervision/Assistance - 24 hour    Equipment Recommendations  3 in 1 bedside comode    Recommendations for Other Services       Precautions / Restrictions Precautions Precautions: Fall Precaution Comments: Pt is impulsive Restrictions Weight Bearing Restrictions: No Other Position/Activity Restrictions: WBAT      Mobility Bed Mobility Overal bed mobility: Needs Assistance Bed Mobility: Supine to Sit     Sit - supine: Max A    General bed mobility comments: cues for sequence and use of L LE to self assist  Transfers Overall transfer level: Needs assistance Equipment used: Rolling walker (2 wheeled) Transfers: Sit to/from [facility] Sit to Stand: Min assist;Mod assist;+2 safety/equipment Stand pivot transfers: Min assist;Mod assist;+2 safety/equipment       General transfer comment: cues for LE management and use of UEs to self assist  Increased assistance required from chair height    Balance     Sitting balance-Leahy Scale: Good       Standing balance-Leahy Scale: Poor                              ADL Overall ADL's : Needs assistance/impaired     Grooming: Set up;Sitting   Upper Body Bathing: Set up;Sitting   Lower Body Bathing:  Sit to/from stand;Moderate assistance   Upper Body Dressing : Set up;Sitting   Lower Body Dressing: Maximal assistance;Sit to/from stand   Toilet Transfer: Maximal assistance           Functional mobility during ADLs: Maximal assistance General ADL Comments: Pt unable to complete sit - stand with +1 A this pm and states "It's not suppose to hurt this much!"  Pt unable to take steps. Only able to scoot leg on floor with pivoting on L foot.  Assisted nursing to get pt back to bed second session     Vision     Perception     Praxis      Pertinent Vitals/Pain Pain Assessment: 0-10 Pain Score: 9  Pain Location: R hip with movement Pain Descriptors / Indicators: Aching;Discomfort;Grimacing;Guarding;Sharp Pain Intervention(s): Limited activity within patient's tolerance;Repositioned;Ice applied     Hand Dominance     Extremity/Trunk Assessment Upper Extremity Assessment Upper Extremity Assessment: Overall WFL for tasks assessed   Lower Extremity Assessment Lower Extremity Assessment: Defer to PT evaluation   Cervical / Trunk Assessment Cervical / Trunk Assessment: Normal   Communication Communication Communication: No difficulties   Cognition Arousal/Alertness: Awake/alert Behavior During Therapy: Impulsive;Agitated Overall Cognitive Status: Within Functional Limits for tasks assessed                     General Comments   "Stop talking and  get on with it because I am in pain"    Exercises       Shoulder Instructions      Home Living Family/patient expects to be discharged to:: Private residence Living Arrangements: Alone Available Help at Discharge: Family;Available PRN/intermittently Type of Home: House Home Access: Stairs to enter [employer] of Steps: 1+1   Home Layout: One level     Bathroom Shower/Tub: Occupational psychologist: Standard Bathroom Accessibility: Yes How Accessible: Accessible via walker Home  Equipment: [place] - 2 wheels;Cane - single point;Shower seat (has borrowed DME)          Prior Functioning/Environment Level of Independence: Independent;Independent with assistive device(s)             OT Diagnosis: Generalized weakness;Acute pain   OT Problem List: Decreased strength;Decreased range of motion;Decreased activity tolerance;Impaired balance (sitting and/or standing);Decreased safety awareness;Decreased knowledge of use of DME or AE;Obesity;Pain   OT Treatment/Interventions: Self-care/ADL training;DME and/or AE instruction;Therapeutic activities;Patient/family education;Balance training    OT Goals(Current goals can be found in the care plan section) Acute Rehab OT Goals Patient Stated Goal: HOME  OT Goal Formulation: With patient Time For Goal Achievement: 02/09/16 Potential to Achieve Goals: Good ADL Goals Pt Will Perform Lower Body Bathing: with supervision;with set-up;sit to/from stand;with adaptive equipment Pt Will Perform Lower Body Dressing: with set-up;with supervision;sit to/from stand;with adaptive equipment Pt Will Transfer to Toilet: with supervision;bedside commode;ambulating Pt Will Perform Toileting - Clothing Manipulation and hygiene: with modified independence;sitting/lateral leans  OT Frequency: Min 2X/week   Barriers to D/C:            Co-evaluation              End of Session Equipment Utilized During Treatment: Gait belt;Rolling walker Nurse Communication: Mobility status  Activity Tolerance: Patient tolerated treatment well Patient left: in chair;with call bell/phone within reach   Time: 1325-1342; [id number] OT Time Calculation (min): 27 min Charges:  OT General Charges $OT Visit: 1 Procedure OT Evaluation $OT Eval Moderate Complexity: 1 Procedure OT Treatments $Self Care/Home Management : 8-22 mins G-Codes:    Merrell Rettinger,HILLARY 29-Jan-2016, 2:35 PM  [facility], OT/L  [phone] 2016-01-29

## 2016-01-27 LAB — TYPE AND SCREEN
ABO/RH(D): A POS
Antibody Screen: NEGATIVE
UNIT DIVISION: 0
Unit division: 0

## 2016-01-27 LAB — CBC
HCT: 26.1 % — ABNORMAL LOW (ref 36.0–46.0)
HEMOGLOBIN: 8.5 g/dL — AB (ref 12.0–15.0)
MCH: 28.7 pg (ref 26.0–34.0)
MCHC: 32.6 g/dL (ref 30.0–36.0)
MCV: 88.2 fL (ref 78.0–100.0)
Platelets: 139 10*3/uL — ABNORMAL LOW (ref 150–400)
RBC: 2.96 MIL/uL — AB (ref 3.87–5.11)
RDW: 17.6 % — ABNORMAL HIGH (ref 11.5–15.5)
WBC: 9.4 10*3/uL (ref 4.0–10.5)

## 2016-01-27 NOTE — Progress Notes (Signed)
Subjective: 2 Days Post-Op Procedure(s) (LRB): RIGHT TOTAL HIP ARTHROPLASTY ANTERIOR APPROACH (Right) Patient reports pain as moderate.  Feels and looks better today.  H/H improved with transfusion.  Objective: Vital signs in last 24 hours: Temp:  [98.3 F (36.8 C)-101.2 F (38.4 C)] 101.2 F (38.4 C) (07/30 0625) Pulse Rate:  [90-107] 99 (07/30 0519) Resp:  [16-18] 18 (07/30 0519) BP: (101-125)/(49-66) 109/55 (07/30 0519) SpO2:  [95 %-100 %] 95 % (07/30 0519)  Intake/Output from previous day: 07/29 0701 - 07/30 0700 In: 2570 [P.O.:1080; I.V.:750; Blood:740] Out: 3050 [Urine:3050] Intake/Output this shift: No intake/output data recorded.   Recent Labs  01/26/16 0401 01/27/16 0427  HGB 7.3* 8.5*    Recent Labs  01/26/16 0401 01/27/16 0427  WBC 9.4 9.4  RBC 2.46* 2.96*  HCT 22.8* 26.1*  PLT 179 139*    Recent Labs  01/26/16 0401  NA 135  K 4.1  CL 106  CO2 22  BUN 10  CREATININE 0.62  GLUCOSE 131*  CALCIUM 8.0*   No results for input(s): LABPT, INR in the last 72 hours.  Sensation intact distally Intact pulses distally Dorsiflexion/Plantar flexion intact Incision: scant drainage Compartment soft  Assessment/Plan: 2 Days Post-Op Procedure(s) (LRB): RIGHT TOTAL HIP ARTHROPLASTY ANTERIOR APPROACH (Right) Up with therapy Plan for discharge tomorrow vs Tuesday pending her progress with therapy.  [name] Y 01/27/2016, 8:38 AM

## 2016-01-27 NOTE — Progress Notes (Signed)
Physical Therapy Treatment Patient Details Name: [name] MRN: [id number] DOB: Oct 29, 1950 Today's Date: 01/27/2016    History of Present Illness Pt s/p R THR and with hx of DM, fibromyalgia, breast CA with R mastectomy, and anxiety    PT Comments    Pt progressing, better overall today  Follow Up Recommendations  Home health PT;Supervision/Assistance - 24 hour;SNF (depending on progress)     Equipment Recommendations  None recommended by PT    Recommendations for Other Services       Precautions / Restrictions Precautions Precautions: Fall Precaution Comments: Pt is impulsive Restrictions Weight Bearing Restrictions: No Other Position/Activity Restrictions: WBAT    Mobility  Bed Mobility Overal bed mobility: Needs Assistance Bed Mobility: Sit to Supine     Supine to sit: Mod assist Sit to supine: Mod assist   General bed mobility comments: assist to bring RLE onto bed and for positioning  Transfers Overall transfer level: Needs assistance Equipment used: Rolling walker (2 wheeled) Transfers: Sit to/from Stand Sit to Stand: Min guard;Min assist Stand pivot transfers: Min guard;From elevated surface       General transfer comment: cues for safety with RW   Ambulation/Gait Ambulation/Gait assistance: Min assist;Min guard Ambulation Distance (Feet): 18 Feet Assistive device: Rolling walker (2 wheeled) Gait Pattern/deviations: Step-to pattern;Antalgic;Decreased weight shift to right Gait velocity: decr   General Gait Details: cues for sequence, safety awareness, posture, position from RW and UE WB.   Stairs            Wheelchair Mobility    Modified Rankin (Stroke Patients Only)       Balance Overall balance assessment: Needs assistance Sitting-balance support: Bilateral upper extremity supported;Feet supported Sitting balance-Leahy Scale: Fair     Standing balance support: Bilateral upper extremity supported;During functional  activity Standing balance-Leahy Scale: Poor                      Cognition Arousal/Alertness: Awake/alert Behavior During Therapy: WFL for tasks assessed/performed Overall Cognitive Status: Within Functional Limits for tasks assessed                      Exercises Total Joint Exercises Ankle Circles/Pumps: AROM;Both;10 reps Quad Sets: AROM;Both;5 reps    General Comments        Pertinent Vitals/Pain Pain Assessment: 0-10 Pain Score: 5  Faces Pain Scale: Hurts little more Pain Location: right thigh Pain Descriptors / Indicators: Aching;Sore Pain Intervention(s): Limited activity within patient's tolerance;Monitored during session;Premedicated before session;Repositioned;Ice applied    Home Living                      Prior Function            PT Goals (current goals can now be found in the care plan section) Acute Rehab PT Goals Patient Stated Goal: HOME  PT Goal Formulation: With patient Time For Goal Achievement: 01/28/16 Potential to Achieve Goals: Good Progress towards PT goals: Progressing toward goals    Frequency  7X/week    PT Plan Current plan remains appropriate;Discharge plan needs to be updated    Co-evaluation             End of Session Equipment Utilized During Treatment: Gait belt Activity Tolerance: Patient tolerated treatment well;Patient limited by fatigue Patient left: in bed;with call bell/phone within reach;with bed alarm set     Time: 0911-0927 PT Time Calculation (min) (ACUTE ONLY): 16 min  Charges:  $Gait  Training: 8-22 mins                    G Codes:      [name] 2016/02/21, 10:27 AM
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
## 2016-01-27 NOTE — Progress Notes (Signed)
   01/27/16 1400  PT Visit Information  Last PT Received On 01/27/16  Assistance Needed +1  History of Present Illness Pt s/p R THR and with hx of DM, fibromyalgia, breast CA with R mastectomy, and anxiety  Subjective Data  Subjective I just had  pain medicine  Patient Stated Goal HOME   Precautions  Precautions Fall  Restrictions  Weight Bearing Restrictions No  Other Position/Activity Restrictions WBAT  Pain Assessment  Pain Assessment 0-10  Faces Pain Scale 4  Pain Location right thigh  Pain Descriptors / Indicators Grimacing;Guarding;Operative site guarding;Sore  Pain Intervention(s) Limited activity within patient's tolerance;Monitored during session;Repositioned;Ice applied  Cognition  Arousal/Alertness Awake/alert  Behavior During Therapy WFL for tasks assessed/performed  Overall Cognitive Status Within Functional Limits for tasks assessed  Bed Mobility  General bed mobility comments (pt declines OOB this pm)  Total Joint Exercises  Ankle Circles/Pumps AROM;Both;10 reps  Heel Slides AAROM;Right;10 reps  Hip ABduction/ADduction AAROM;Right;10 reps  Quad Sets AROM;Both;10 reps  PT - End of Session  Activity Tolerance Patient limited by pain;Other (comment) (limited motivation)  Patient left in bed;with call bell/phone within reach;with bed alarm set  Nurse Communication Mobility status  PT - Assessment/Plan  PT Plan Current plan remains appropriate;Discharge plan needs to be updated  PT Frequency (ACUTE ONLY) 7X/week  Follow Up Recommendations Home health PT;Supervision/Assistance - 24 hour;SNF  PT equipment None recommended by PT  PT Goal Progression  Progress towards PT goals Progressing toward goals (slowly)  Acute Rehab PT Goals  PT Goal Formulation With patient  Time For Goal Achievement 01/28/16  Potential to Achieve Goals Good  PT Time Calculation  PT Start Time (ACUTE ONLY) 1353  PT Stop Time (ACUTE ONLY) 1414  PT Time Calculation (min) (ACUTE ONLY) 21  min  PT General Charges  $$ ACUTE PT VISIT 1 Procedure  PT Treatments  $Therapeutic Exercise 8-22 mins

## 2016-01-28 NOTE — NC FL2 (Signed)
[place] LEVEL OF CARE SCREENING TOOL     IDENTIFICATION  Patient Name: [name] Birthdate: 11-28-1950 Sex: female Admission Date (Current Location): 01/25/2016  [facility] and [place] Number:  Herbalist and Address:  [facility],  [place], [place]      Provider Number: [id number]  Attending Physician Name and Address:  Mcarthur Rossetti, *  Relative Name and Phone Number:       Current Level of Care: Hospital Recommended Level of Care: [place] Prior Approval Number:    Date Approved/Denied:   PASRR Number: [id number]  Discharge Plan:      Current Diagnoses: Patient Active Problem List   Diagnosis Date Noted  . Osteoarthritis of right hip 01/25/2016  . 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 Cough 05/17/2014  . Lower back pain 04/19/2013  . Obesity (BMI 30-39.9) 06/14/2012  . Acute cholecystitis with chronic cholecystitis 06/04/2012  . 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 Chronic diarrhea 06/04/2012  . Poor venous access 06/04/2012  . Hemangioma of liver - left hepatic lobe 06/04/2012  . Right hip pain 09/12/2011  . History of detached retina repair 02/12/2011  . Preventative health care 02/08/2011  . INSOMNIA-SLEEP DISORDER-UNSPEC 11/21/2009  . DOE (dyspnea on exertion) 11/21/2009  . Essential hypertension 04/30/2008  . GERD 04/30/2008  . EPISTAXIS, RECURRENT 01/27/2008  . KNEE SPRAIN, ACUTE 01/27/2008  . FIBROMYALGIA 01/13/2008  . LEG PAIN, LEFT 01/13/2008  . ARTHRITIS 10/25/2007  . OSTEOPENIA 07/26/2007  . HIP PAIN, RIGHT 04/30/2007  . HEPATITIS C 08/03/2006  . Diabetes ([place]) 08/03/2006  . DEPRESSION 08/03/2006  . BREAST CANCER, HX OF 08/03/2006    Orientation RESPIRATION BLADDER Height & Weight     Self, Time, Situation, Place  Normal Continent Weight: 181 lb (82.1 kg) Height:  5\' 2"  (157.5 cm)  BEHAVIORAL SYMPTOMS/MOOD NEUROLOGICAL  BOWEL NUTRITION STATUS  Other (Comment) (No Behaviors)   Continent Diet  AMBULATORY STATUS COMMUNICATION OF NEEDS Skin   Limited Assist Verbally Surgical wounds                       Personal Care Assistance Level of Assistance  Bathing, Feeding, Dressing Bathing Assistance: Limited assistance Feeding assistance: Independent Dressing Assistance: Limited assistance     Functional Limitations Info  Sight, Hearing, Speech Sight Info: Adequate Hearing Info: Adequate Speech Info: Adequate    SPECIAL CARE FACTORS FREQUENCY  PT (By licensed PT), OT (By licensed OT)     PT Frequency: 5 x wk OT Frequency: 5 x wk            Contractures Contractures Info: Not present    Additional Factors Info  Code Status Code Status Info: Full Code             Current Medications (01/28/2016):  This is the current hospital active medication list Current Facility-Administered Medications  Medication Dose Route Frequency Provider Last Rate Last Dose  . 0.9 %  sodium chloride infusion   Intravenous Once Mcarthur Rossetti, MD      . acetaminophen (TYLENOL) tablet 650 mg  650 mg Oral Q6H PRN Mcarthur Rossetti, MD   650 mg at 01/27/16 0534   Or  . acetaminophen (TYLENOL) suppository 650 mg  650 mg Rectal Q6H PRN Mcarthur Rossetti, MD      . alum & mag hydroxide-simeth (MAALOX/MYLANTA) 200-200-20 MG/5ML suspension 30 mL  30 mL Oral  Q4H PRN Mcarthur Rossetti, MD      . aspirin EC tablet 325 mg  325 mg Oral Q breakfast Mcarthur Rossetti, MD   325 mg at 01/28/16 0846  . buPROPion (WELLBUTRIN XL) 24 hr tablet 300 mg  300 mg Oral Daily Mcarthur Rossetti, MD   300 mg at 01/28/16 0847  . cyclobenzaprine (FLEXERIL) tablet 10 mg  10 mg Oral TID PRN Mcarthur Rossetti, MD   10 mg at 01/28/16 0315  . diphenhydrAMINE (BENADRYL) 12.5 MG/5ML elixir 12.5-25 mg  12.5-25 mg Oral Q4H PRN Mcarthur Rossetti, MD   25 mg at 01/25/16 2301  . docusate sodium (COLACE) capsule  100 mg  100 mg Oral BID Mcarthur Rossetti, MD   100 mg at 01/27/16 0944  . FLUoxetine (PROZAC) capsule 60 mg  60 mg Oral Daily Mcarthur Rossetti, MD   60 mg at 01/28/16 0846  . HYDROmorphone (DILAUDID) injection 1 mg  1 mg Intravenous Q2H PRN Mcarthur Rossetti, MD   1 mg at 01/26/16 0457  . losartan (COZAAR) tablet 50 mg  50 mg Oral Daily Mcarthur Rossetti, MD   50 mg at 01/28/16 0847  . menthol-cetylpyridinium (CEPACOL) lozenge 3 mg  1 lozenge Oral PRN Mcarthur Rossetti, MD       Or  . phenol (CHLORASEPTIC) mouth spray 1 spray  1 spray Mouth/Throat PRN Mcarthur Rossetti, MD      . metoCLOPramide (REGLAN) tablet 5-10 mg  5-10 mg Oral Q8H PRN Mcarthur Rossetti, MD       Or  . metoCLOPramide (REGLAN) injection 5-10 mg  5-10 mg Intravenous Q8H PRN Mcarthur Rossetti, MD      . ondansetron [facility]) tablet 4 mg  4 mg Oral Q6H PRN Mcarthur Rossetti, MD       Or  . ondansetron [facility]) injection 4 mg  4 mg Intravenous Q6H PRN Mcarthur Rossetti, MD      . oxyCODONE (Oxy IR/ROXICODONE) immediate release tablet 5-20 mg  5-20 mg Oral Q3H PRN Mcarthur Rossetti, MD   20 mg at 01/28/16 0846  . pantoprazole (PROTONIX) EC tablet 40 mg  40 mg Oral BID AC Mcarthur Rossetti, MD   40 mg at 01/28/16 0846  . temazepam (RESTORIL) capsule 30 mg  30 mg Oral QHS PRN Mcarthur Rossetti, MD   30 mg at 01/27/16 2247     Discharge Medications: Please see discharge summary for a list of discharge medications.  Relevant Imaging Results:  Relevant Lab Results:   Additional Information SS # [id number]  Fatoumata Albaugh, Randall An, LCSW

## 2016-01-28 NOTE — Clinical Social Work Placement (Signed)
   CLINICAL SOCIAL WORK PLACEMENT  NOTE  Date:  01/28/2016  Patient Details  Name: [name] MRN: [id number] Date of Birth: 1950/07/08  Clinical Social Work is seeking post-discharge placement for this patient at the [place] level of care (*CSW will initial, date and re-position this form in  chart as items are completed):  Yes   Patient/family provided with [place] Work Department's list of facilities offering this level of care within the geographic area requested by the patient (or if unable, by the patient's family).  Yes   Patient/family informed of their freedom to choose among providers that offer the needed level of care, that participate in Medicare, Medicaid or managed care program needed by the patient, have an available bed and are willing to accept the patient.  Yes   Patient/family informed of 's ownership interest in [facility] and [facility], as well as of the fact that they are under no obligation to receive care at these facilities.  PASRR submitted to EDS on 01/28/16     PASRR number received on 01/28/16     Existing PASRR number confirmed on       FL2 transmitted to all facilities in geographic area requested by pt/family on 01/28/16     FL2 transmitted to all facilities within larger geographic area on       Patient informed that his/her managed care company has contracts with or will negotiate with certain facilities, including the following:        Yes   Patient/family informed of bed offers received.  Patient chooses bed at [facility]   Physician recommends and patient chooses bed at      Patient to be transferred to   on  .  Patient to be transferred to facility by       Patient family notified on   of transfer.  Name of family member notified:        PHYSICIAN       Additional Comment:    _______________________________________________ Luretha Rued, LCSW 01/28/2016, 4:41  PM

## 2016-01-28 NOTE — Progress Notes (Signed)
Patient ID: [name], female   DOB: 1951/02/04, 65 y.o.   MRN: [id number] Making very slow progress.  Vitals stable.  Right hip dressing with scant drainage.  Will need to consult Social Work about skilled nursing placement.  Plan to discharge tomorrow.

## 2016-01-28 NOTE — Care Management Note (Signed)
Case Management Note  Patient Details  Name: [name] MRN: [id number] Date of Birth: Feb 10, 1951  Subjective/Objective:  Patient from home alone, 65 yo f admitted w/OA R hip POD#3. Patient likely to d/c SNF. CSW already following.[place] Go rep Octavia Bruckner also following if d/c plans are to home.[facility] dme rep already aware of need for RW, 3n1.                 Action/Plan:d/c plan SNF.   Expected Discharge Date:                  Expected Discharge Plan:  [place]  In-House Referral:  NA  Discharge planning Services  CM Consult  Post Acute Care Choice:  Home Health Choice offered to:  Patient  DME Arranged:  3-N-1, Walker rolling DME Agency:  [place]:  PT [place]:  [facility] (now [facility])  Status of Service:  In process, will continue to follow  If discussed at Long Length of Stay Meetings, dates discussed:    Additional Comments:  Dessa Phi, RN 01/28/2016, 11:09 AM

## 2016-01-28 NOTE — Progress Notes (Signed)
Physical Therapy Treatment Patient Details Name: [name] MRN: [id number] DOB: 10-29-1950 Today's Date: 01/28/2016    History of Present Illness Pt s/p R THR and with hx of DM, fibromyalgia, breast CA with R mastectomy, and anxiety    PT Comments    Progressing very slowly with mobility. Max encouragement for pt to self assist and progress activity. At this time, feel ST rehab at SNF will be needed.   Follow Up Recommendations  SNF; 24 hour supervision/assist     Equipment Recommendations  None recommended by PT    Recommendations for Other Services       Precautions / Restrictions Precautions Precautions: Fall Restrictions Weight Bearing Restrictions: No Other Position/Activity Restrictions: WBAT    Mobility  Bed Mobility Overal bed mobility: Needs Assistance Bed Mobility: Supine to Sit     Supine to sit: Mod assist     General bed mobility comments: Assist for R LE and trunk. Encouraged pt to self assist as much as possible. Increased time. VCs safety, technique.  Transfers Overall transfer level: Needs assistance Equipment used: Rolling walker (2 wheeled) Transfers: Sit to/from Stand Sit to Stand: Mod assist;From elevated surface         General transfer comment: Assist to rise, stabilize, control descent. VCs safety, technique, hand/LE placement. Encouraged pt to self assist as much as possible.   Ambulation/Gait Ambulation/Gait assistance: Min assist Ambulation Distance (Feet): 5 Feet Assistive device: Rolling walker (2 wheeled)       General Gait Details: Max VCs for safety, posture, technique, sequence. Encouraged pt to WB on R LE and to use UEs to assist as needed. Pt declined to ambulate any farther. Followed closely with recliner.     Stairs            Wheelchair Mobility    Modified Rankin (Stroke Patients Only)       Balance                                    Cognition Arousal/Alertness:  Awake/alert Behavior During Therapy: Anxious;Agitated Overall Cognitive Status: Within Functional Limits for tasks assessed                      Exercises Total Joint Exercises Ankle Circles/Pumps: AROM;Both;10 reps Quad Sets: AROM;Both;10 reps Heel Slides: AAROM;Right;10 reps Hip ABduction/ADduction: AAROM;Right;10 reps    General Comments        Pertinent Vitals/Pain Pain Assessment: Faces Faces Pain Scale: Hurts even more Pain Location: R thigh/LE Pain Descriptors / Indicators: Grimacing;Operative site guarding;Burning;Sore Pain Intervention(s): Limited activity within patient's tolerance;Premedicated before session;Repositioned    Home Living                      Prior Function            PT Goals (current goals can now be found in the care plan section) Progress towards PT goals: Progressing toward goals (very slowly. Lack of motivation a factor as well)    Frequency  7X/week    PT Plan Current plan remains appropriate    Co-evaluation             End of Session Equipment Utilized During Treatment: Gait belt Activity Tolerance: Patient limited by pain;Patient limited by fatigue;Other (comment) (lack of motivation to progress activity) Patient left: in chair;with call bell/phone within reach;with chair alarm set     Time:  [id number] PT Time Calculation (min) (ACUTE ONLY): 21 min  Charges:  $Gait Training: 8-22 mins                    G Codes:      Weston Anna, MPT Pager: [phone]

## 2016-01-28 NOTE — Progress Notes (Signed)
Occupational Therapy Treatment Patient Details Name: [name] MRN: [id number] DOB: Jan 08, 1951 Today's Date: 01/28/2016    History of present illness Pt s/p R THR and with hx of DM, fibromyalgia, breast CA with R mastectomy, and anxiety   OT comments  Pt  Required Mod A to hold leg to bring recliner up into sitting position for ADLs. Pt. Was ed on use of AE for donning of socks, shoes, and pants. Pt. Was able to return demo use of equipment at Mod A level. Pt. Was ed on use of sponge brush and verbalized understanding.  Follow Up Recommendations  SNF    Equipment Recommendations       Recommendations for Other Services      Precautions / Restrictions Precautions Precautions: Fall Restrictions Weight Bearing Restrictions: No Other Position/Activity Restrictions: WBAT       Mobility Bed Mobility Overal bed mobility: Needs Assistance Bed Mobility: Supine to Sit     Supine to sit: Mod assist     General bed mobility comments: Assist for R LE and trunk. Encouraged pt to self assist as much as possible. Increased time. VCs safety, technique.  Transfers Overall transfer level: Needs assistance Equipment used: Rolling walker (2 wheeled) Transfers: Sit to/from Stand Sit to Stand: Mod assist;From elevated surface         General transfer comment: Assist to rise, stabilize, control descent. VCs safety, technique, hand/LE placement. Encouraged pt to self assist as much as possible.     Balance                                   ADL                       Lower Body Dressing: Moderate assistance                 General ADL Comments: Pt. did not want to get out of recliner and deferred 3-1 commode transfer.       Vision                     Perception     Praxis      Cognition   Behavior During Therapy: [facility] for tasks assessed/performed Overall Cognitive Status: Within Functional Limits for tasks assessed                        Extremity/Trunk Assessment               Exercises Total Joint Exercises Ankle Circles/Pumps: AROM;Both;10 reps Quad Sets: AROM;Both;10 reps Heel Slides: AAROM;Right;10 reps Hip ABduction/ADduction: AAROM;Right;10 reps   Shoulder Instructions       General Comments      Pertinent Vitals/ Pain       Pain Assessment: 0-10 Faces Pain Scale: Hurts even more Pain Location: R hip Pain Descriptors / Indicators: Grimacing;Operative site guarding;Burning;Sore Pain Intervention(s): Limited activity within patient's tolerance  Home Living                                          Prior Functioning/Environment              Frequency       Progress Toward Goals  OT Goals(current goals can now be found in the  care plan section)  Progress towards OT goals: Progressing toward goals  Acute Rehab OT Goals Patient Stated Goal: Pt. is now interested in going to a SNF for rehab secondary she lives alone and is progressing slowly wtih therapy.   Plan Discharge plan needs to be updated    Co-evaluation                 End of Session     Activity Tolerance Patient limited by pain   Patient Left in chair;with call bell/phone within reach   Nurse Communication          Time: 1130-1200 OT Time Calculation (min): 30 min  Charges: OT General Charges $OT Visit: 1 Procedure OT Treatments $Self Care/Home Management : 23-37 mins  Anntoinette Haefele 01/28/2016, 12:13 PM

## 2016-01-28 NOTE — Clinical Social Work Note (Signed)
Clinical Social Work Assessment  Patient Details  Name: [name] MRN: [id number] Date of Birth: 25-Jun-1951  Date of referral:  01/28/16               Reason for consult:  Discharge Planning, Facility Placement                Permission sought to share information with:  Facility Art therapist granted to share information::  Yes, Verbal Permission Granted  Name::        Agency::     Relationship::     Contact Information:     Housing/Transportation Living arrangements for the past 2 months:  Single Family Home Source of Information:  Patient Patient Interpreter Needed:    Criminal Activity/Legal Involvement Pertinent to Current Situation/Hospitalization:  No - Comment as needed Significant Relationships:  Siblings Lives with:  Self Do you feel safe going back to the place where you live?  No Need for family participation in patient care:  Yes (Comment)  Care giving concerns: Pt 's care cannot be managed at home following hospital d/c.   Social Worker assessment / plan:  Pt hospitalized on 01/24/16 for pre planned Right total hip arthroplasty. CSW met with pt to assist with d/c planning. PT has recommended SNF at d/c. Pt is in agreement with this plan. SNF search has been initiated and bed offers provided. Pt has chosen [facility] for rehab. CSW will continue to follow to assist with d/c planning to SNF. Employment status:  Retired Forensic scientist:  Medicare PT Recommendations:  [place] / Referral to community resources:  [place]  Patient/Family's Response to care:  Pt / sister are in agreement with plan for [employer].  Patient/Family's Understanding of and Emotional Response to Diagnosis, Current Treatment, and Prognosis:  Pt is aware of her medical status. She is motivated to work with therapy. " I want only a short stay at rehab. " Pt is disappointed that she is unable to return home safely. Support  provided. Emotional Assessment Appearance:  Appears stated age Attitude/Demeanor/Rapport:  Other (cooperative) Affect (typically observed):  Pleasant, Appropriate Orientation:  Oriented to Self, Oriented to Place, Oriented to  Time, Oriented to Situation Alcohol / Substance use:  Not Applicable Psych involvement (Current and /or in the community):  No (Comment)  Discharge Needs  Concerns to be addressed:  Discharge Planning Concerns Readmission within the last 30 days:  No Current discharge risk:  None Barriers to Discharge:      Loraine Maple  [phone] 01/28/2016, 4:32 PM

## 2016-01-29 DIAGNOSIS — G894 Chronic pain syndrome: Secondary | ICD-10-CM | POA: Diagnosis not present

## 2016-01-29 DIAGNOSIS — E1165 Type 2 diabetes mellitus with hyperglycemia: Secondary | ICD-10-CM | POA: Diagnosis not present

## 2016-01-29 DIAGNOSIS — M1611 Unilateral primary osteoarthritis, right hip: Secondary | ICD-10-CM | POA: Diagnosis not present

## 2016-01-29 DIAGNOSIS — E785 Hyperlipidemia, unspecified: Secondary | ICD-10-CM | POA: Diagnosis not present

## 2016-01-29 DIAGNOSIS — F322 Major depressive disorder, single episode, severe without psychotic features: Secondary | ICD-10-CM | POA: Diagnosis not present

## 2016-01-29 DIAGNOSIS — E119 Type 2 diabetes mellitus without complications: Secondary | ICD-10-CM | POA: Diagnosis not present

## 2016-01-29 DIAGNOSIS — F331 Major depressive disorder, recurrent, moderate: Secondary | ICD-10-CM | POA: Diagnosis not present

## 2016-01-29 DIAGNOSIS — C50912 Malignant neoplasm of unspecified site of left female breast: Secondary | ICD-10-CM | POA: Diagnosis not present

## 2016-01-29 DIAGNOSIS — M1990 Unspecified osteoarthritis, unspecified site: Secondary | ICD-10-CM | POA: Diagnosis not present

## 2016-01-29 DIAGNOSIS — Z4789 Encounter for other orthopedic aftercare: Secondary | ICD-10-CM | POA: Diagnosis not present

## 2016-01-29 DIAGNOSIS — R2689 Other abnormalities of gait and mobility: Secondary | ICD-10-CM | POA: Diagnosis not present

## 2016-01-29 DIAGNOSIS — M6281 Muscle weakness (generalized): Secondary | ICD-10-CM | POA: Diagnosis not present

## 2016-01-29 DIAGNOSIS — F411 Generalized anxiety disorder: Secondary | ICD-10-CM | POA: Diagnosis not present

## 2016-01-29 DIAGNOSIS — D649 Anemia, unspecified: Secondary | ICD-10-CM | POA: Diagnosis not present

## 2016-01-29 DIAGNOSIS — I10 Essential (primary) hypertension: Secondary | ICD-10-CM | POA: Diagnosis not present

## 2016-01-29 DIAGNOSIS — M797 Fibromyalgia: Secondary | ICD-10-CM | POA: Diagnosis not present

## 2016-01-29 DIAGNOSIS — B1920 Unspecified viral hepatitis C without hepatic coma: Secondary | ICD-10-CM | POA: Diagnosis not present

## 2016-01-29 DIAGNOSIS — K219 Gastro-esophageal reflux disease without esophagitis: Secondary | ICD-10-CM | POA: Diagnosis not present

## 2016-01-29 DIAGNOSIS — G4700 Insomnia, unspecified: Secondary | ICD-10-CM | POA: Diagnosis not present

## 2016-01-29 DIAGNOSIS — Z96649 Presence of unspecified artificial hip joint: Secondary | ICD-10-CM | POA: Diagnosis not present

## 2016-01-29 DIAGNOSIS — R278 Other lack of coordination: Secondary | ICD-10-CM | POA: Diagnosis not present

## 2016-01-29 MED ORDER — CYCLOBENZAPRINE HCL 10 MG PO TABS: 10.0000 mg | ORAL_TABLET | Freq: Three times a day (TID) | ORAL | 0 refills | Status: DC | PRN

## 2016-01-29 MED ORDER — OXYCODONE-ACETAMINOPHEN 5-325 MG PO TABS: 1.0000 | ORAL_TABLET | ORAL | 0 refills | Status: DC | PRN

## 2016-01-29 MED ORDER — ASPIRIN 325 MG PO TBEC: 325.0000 mg | DELAYED_RELEASE_TABLET | Freq: Every day | ORAL | 0 refills | Status: DC

## 2016-01-29 NOTE — Discharge Instructions (Signed)

## 2016-01-29 NOTE — Progress Notes (Signed)
Attempted to call report to [facility] twice. No answer after transferred to nurse. Note let on discharge packet.

## 2016-01-29 NOTE — Progress Notes (Signed)
Physical Therapy Treatment Patient Details Name: [name] MRN: [id number] DOB: 11-11-1950 Today's Date: 01/29/2016    History of Present Illness Pt s/p R THR and with hx of DM, fibromyalgia, breast CA with R mastectomy, and anxiety    PT Comments    Pt is progressing slowly, requires much encouragement and is easily agitated; will benefit form SNF   Follow Up Recommendations  SNF     Equipment Recommendations  None recommended by PT    Recommendations for Other Services       Precautions / Restrictions Restrictions Weight Bearing Restrictions: No RLE Weight Bearing: Weight bearing as tolerated    Mobility  Bed Mobility Overal bed mobility: Needs Assistance Bed Mobility: Supine to Sit     Supine to sit: Min assist     General bed mobility comments: Assist for R LE and trunk. Encouraged pt to self assist as much as possible. Increased time. VCs safety, technique.  Transfers Overall transfer level: Needs assistance Equipment used: Rolling walker (2 wheeled) Transfers: Sit to/from Stand Sit to Stand: Min assist;From elevated surface         General transfer comment: Assist to rise, stabilize, control descent. VCs safety, technique, hand/LE placement. Encouraged pt to self assist as much as possible.   Ambulation/Gait Ambulation/Gait assistance: Min assist;Min guard Ambulation Distance (Feet): 28 Feet Assistive device: Rolling walker (2 wheeled) Gait Pattern/deviations: Step-to pattern;Antalgic;Decreased weight shift to right Gait velocity: decr   General Gait Details: verbal cues for safety with RW, sequence; encouragement to amb greater distance today   Stairs            Wheelchair Mobility    Modified Rankin (Stroke Patients Only)       Balance                                    Cognition Arousal/Alertness: Awake/alert Behavior During Therapy: Agitated Overall Cognitive Status: Within Functional Limits for tasks assessed                      Exercises Total Joint Exercises Long Arc Quad: AROM;Right;15 reps    General Comments        Pertinent Vitals/Pain Faces Pain Scale: Hurts little more Pain Location: righ hip Pain Descriptors / Indicators: Grimacing;Guarding Pain Intervention(s): Limited activity within patient's tolerance;Monitored during session;Repositioned;Ice applied    Home Living                      Prior Function            PT Goals (current goals can now be found in the care plan section) Acute Rehab PT Goals PT Goal Formulation: With patient Time For Goal Achievement: 01/30/16 Potential to Achieve Goals: Good Progress towards PT goals: Progressing toward goals    Frequency  7X/week    PT Plan Current plan remains appropriate    Co-evaluation             End of Session Equipment Utilized During Treatment: Gait belt Activity Tolerance: Patient tolerated treatment well;Other (comment) (limited by self motivation) Patient left: with call bell/phone within reach;in chair;with chair alarm set;with nursing/sitter in room     Time: 1047-1104 PT Time Calculation (min) (ACUTE ONLY): 17 min  Charges:  $Gait Training: 8-22 mins  G CodesKenyon Klein 01/29/2016, 11:11 AM

## 2016-01-29 NOTE — Discharge Summary (Signed)
Patient ID: [name] MRN: [id number] DOB/AGE: 1951/01/25 65 y.o.  Admit date: 01/25/2016 Discharge date: 01/29/2016  Admission Diagnoses:  Principal Problem:   Osteoarthritis of right hip Active Problems:   Status post total replacement of right hip   Discharge Diagnoses:  Same  Past Medical History:  Diagnosis Date  . Anemia    2016 on iron for a while   . Anxiety   . ARTHRITIS 10/25/2007  . Arthritis   . Blood transfusion without reported diagnosis   . BREAST CANCER, HX OF 08/03/2006  . Cancer ([place])    hx of breast ca x 2 on right   . DEPRESSION 08/03/2006  . Diabetes ([place])    prediabetic   . DIABETES MELLITUS, TYPE II 08/03/2006  . DYSPNEA ON EXERTION 11/21/2009   shortness of breath with exertion   . EPISTAXIS, RECURRENT 01/27/2008  . FIBROMYALGIA 01/13/2008  . GERD 04/30/2008  . HEPATITIS C 08/03/2006  . HIP PAIN, RIGHT 04/30/2007  . History of detached retina repair 02/12/2011  . HYPERTENSION 04/30/2008  . INSOMNIA-SLEEP DISORDER-UNSPEC 11/21/2009  . KNEE SPRAIN, ACUTE 01/27/2008  . LEG PAIN, LEFT 01/13/2008  . OSTEOPENIA 07/26/2007  . Persistent vomiting 08/17/2008  . Pneumonia    hx of   . Prediabetes     Surgeries: Procedure(s): RIGHT TOTAL HIP ARTHROPLASTY ANTERIOR APPROACH on 01/25/2016   Consultants:   Discharged Condition: Improved  Hospital Course: TERRICA DACE is an 65 y.o. female who was admitted 01/25/2016 for operative treatment ofOsteoarthritis of right hip. Patient has severe unremitting pain that affects sleep, daily activities, and work/hobbies. After pre-op clearance the patient was taken to the operating room on 01/25/2016 and underwent  Procedure(s): RIGHT TOTAL HIP ARTHROPLASTY ANTERIOR APPROACH.    Patient was given perioperative antibiotics: Anti-infectives    Start     Dose/Rate Route Frequency Ordered Stop   01/25/16 1600  ceFAZolin (ANCEF) IVPB 1 g/50 mL premix     1 g 100 mL/hr over 30 Minutes Intravenous Every 6 hours 01/25/16 1438  01/25/16 2139   01/25/16 0741  ceFAZolin (ANCEF) IVPB 2g/100 mL premix     2 g 200 mL/hr over 30 Minutes Intravenous On call to O.R. 01/25/16 0741 01/25/16 1020       Patient was given sequential compression devices, early ambulation, and chemoprophylaxis to prevent DVT.  Patient benefited maximally from hospital stay and there were no complications.    Recent vital signs: Patient Vitals for the past 24 hrs:  BP Temp Temp src Pulse Resp SpO2  01/28/16 2252 138/79 97.9 F (36.6 C) Oral 98 18 94 %  01/28/16 1441 123/68 97.9 F (36.6 C) Oral 97 18 96 %     Recent laboratory studies:  Recent Labs  01/27/16 0427  WBC 9.4  HGB 8.5*  HCT 26.1*  PLT 139*     Discharge Medications:     Medication List    STOP taking these medications   HYDROcodone-acetaminophen 10-325 MG tablet Commonly known as:  NORCO     TAKE these medications   aspirin 325 MG EC tablet Take 1 tablet (325 mg total) by mouth daily with breakfast.   buPROPion 300 MG 24 hr tablet Commonly known as:  WELLBUTRIN XL Take 1 tablet (300 mg total) by mouth every morning. What changed:  when to take this   cholestyramine 4 GM/DOSE powder Commonly known as:  QUESTRAN Take 1 packet (4 g total) by mouth 2 (two) times daily with a meal.   cyclobenzaprine  10 MG tablet Commonly known as:  FLEXERIL Take 1 tablet (10 mg total) by mouth 3 (three) times daily as needed for muscle spasms.   FLUoxetine 20 MG capsule Commonly known as:  PROZAC Take 1 capsule (20 mg total) by mouth 3 (three) times daily. What changed:  how much to take  when to take this   glucose blood test strip Commonly known as:  ONE TOUCH ULTRA TEST Use as directed once daily   losartan 50 MG tablet Commonly known as:  COZAAR Take 50 mg by mouth daily.   losartan 25 MG tablet Commonly known as:  COZAAR Take 1 tablet (25 mg total) by mouth daily.   methocarbamol 500 MG tablet Commonly known as:  ROBAXIN Take 500 mg by mouth 3  (three) times daily.   omeprazole 20 MG capsule Commonly known as:  PRILOSEC Take 2 capsules (40 mg total) by mouth daily. What changed:  how much to take  when to take this   oxyCODONE-acetaminophen 5-325 MG tablet Commonly known as:  ROXICET Take 1-2 tablets by mouth every 4 (four) hours as needed.   PEDIASURE Liqd Take 237 mLs by mouth daily.   temazepam 30 MG capsule Commonly known as:  RESTORIL Take 30 mg by mouth at bedtime as needed for sleep.       Diagnostic Studies: Dg C-arm 61-120 Min-no Report  Result Date: 01/25/2016 CLINICAL DATA: surgery C-ARM 61-120 MINUTES Fluoroscopy was utilized by the requesting physician.  No radiographic interpretation.   Dg Hip Port Unilat With Pelvis 1v Right  Result Date: 01/25/2016 CLINICAL DATA:  Postop day 0 anterior approach right total hip arthroplasty performed for osteoarthritis. EXAM: DG HIP (WITH OR WITHOUT PELVIS) 1V PORT RIGHT 12:24 p.m.: COMPARISON:  Intraoperative x-rays earlier today 11:49 a.m. FINDINGS: Portable AP and cross-table lateral images were obtained. Anatomic alignment post right total hip arthroplasty. No acute complicating features. IMPRESSION: Anatomic alignment post right total hip arthroplasty without acute complicating features. Electronically Signed   By: Evangeline Dakin M.D.   On: 01/25/2016 13:45  Dg Hip Operative Unilat With Pelvis Right  Result Date: 01/25/2016 CLINICAL DATA:  Right total hip replacement. EXAM: OPERATIVE right HIP (WITH PELVIS IF PERFORMED) 1 VIEW TECHNIQUE: Fluoroscopic spot image(s) were submitted for interpretation post-operatively. COMPARISON:  None. FINDINGS: Placement of a total right hip arthroplasty. Pubic rami are intact. The entire femoral stem is visualized. Right hip prosthesis appears located on this single view. IMPRESSION: Placement a right total hip arthroplasty. Electronically Signed   By: Markus Daft M.D.   On: 01/25/2016 14:18   Disposition: to skilled nursing  facility  Discharge Instructions    Call MD / Call 911    Complete by:  As directed   If you experience chest pain or shortness of breath, CALL 911 and be transported to the hospital emergency room.  If you develope a fever above 101 F, pus (white drainage) or increased drainage or redness at the wound, or calf pain, call your surgeon's office.   Constipation Prevention    Complete by:  As directed   Drink plenty of fluids.  Prune juice may be helpful.  You may use a stool softener, such as Colace (over the counter) 100 mg twice a day.  Use MiraLax (over the counter) for constipation as needed.   Diet - low sodium heart healthy    Complete by:  As directed   Discharge patient    Complete by:  As directed   Increase activity  slowly as tolerated    Complete by:  As directed      Follow-up Information    [facility] .   Why:  Home Health Physical Therapy Contact information: [address] [place] [address] [phone]        Mcarthur Rossetti, MD Follow up in 2 week(s).   Specialty:  Orthopedic Surgery Contact information: [place] [address] [phone]            Signed: Mcarthur Rossetti 01/29/2016, 7:05 AM

## 2016-01-29 NOTE — Progress Notes (Signed)
Patient ID: SPIRITUAL BACINO, female   DOB: January 30, 1951, 65 y.o.   MRN: [id number] Making progress.  Vitals stable.  Right hip stable.  Can go to skilled nursing today.

## 2016-01-29 NOTE — Care Management Important Message (Signed)
Important Message  Patient Details  Name: [name] MRN: [id number] Date of Birth: [birth year]   Medicare Important Message Given:  Yes    Camillo Flaming 01/29/2016, 9:42 AMImportant Message  Patient Details  Name: [name] MRN: [id number] Date of Birth: 02/25/1951   Medicare Important Message Given:  Yes    Camillo Flaming 01/29/2016, 9:42 AM

## 2016-01-29 NOTE — Clinical Social Work Placement (Signed)
   CLINICAL SOCIAL WORK PLACEMENT  NOTE  Date:  01/29/2016  Patient Details  Name: [name] MRN: [id number] Date of Birth: [birth year]  Clinical Social Work is seeking post-discharge placement for this patient at the [place] level of care (*CSW will initial, date and re-position this form in  chart as items are completed):  Yes   Patient/family provided with [facility] Work Department's list of facilities offering this level of care within the geographic area requested by the patient (or if unable, by the patient's family).  Yes   Patient/family informed of their freedom to choose among providers that offer the needed level of care, that participate in Medicare, Medicaid or managed care program needed by the patient, have an available bed and are willing to accept the patient.  Yes   Patient/family informed of [facility]'s ownership interest in [facility] and [facility], as well as of the fact that they are under no obligation to receive care at these facilities.  PASRR submitted to EDS on 01/28/16     PASRR number received on 01/28/16     Existing PASRR number confirmed on       FL2 transmitted to all facilities in geographic area requested by pt/family on 01/28/16     FL2 transmitted to all facilities within larger geographic area on       Patient informed that his/her managed care company has contracts with or will negotiate with certain facilities, including the following:        Yes   Patient/family informed of bed offers received.  Patient chooses bed at [facility]     Physician recommends and patient chooses bed at      Patient to be transferred to [facility] on 01/29/16.  Patient to be transferred to facility by [facility]     Patient family notified on 01/29/16 of transfer.  Name of family member notified:  sister     PHYSICIAN       Additional Comment: Pt / sister are in agreement with  d/c to [facility] today. [facility] transport recommended by nsg. due to difficulty with transfers Medical necessity form completed. Pt / sister are aware out of pocket costs may be associated with [facility] transport. D/C Summary sent to SNF for review. Scripts included in d/c packet. # for report provided to nsg.   _______________________________________________ Luretha Rued, [place]   [phone] 01/29/2016, 1:30 PM

## 2016-01-31 DIAGNOSIS — F322 Major depressive disorder, single episode, severe without psychotic features: Secondary | ICD-10-CM | POA: Diagnosis not present

## 2016-01-31 DIAGNOSIS — E119 Type 2 diabetes mellitus without complications: Secondary | ICD-10-CM | POA: Diagnosis not present

## 2016-01-31 DIAGNOSIS — M797 Fibromyalgia: Secondary | ICD-10-CM | POA: Diagnosis not present

## 2016-01-31 DIAGNOSIS — D649 Anemia, unspecified: Secondary | ICD-10-CM | POA: Diagnosis not present

## 2016-01-31 DIAGNOSIS — I10 Essential (primary) hypertension: Secondary | ICD-10-CM | POA: Diagnosis not present

## 2016-01-31 DIAGNOSIS — C50912 Malignant neoplasm of unspecified site of left female breast: Secondary | ICD-10-CM | POA: Diagnosis not present

## 2016-01-31 DIAGNOSIS — M1990 Unspecified osteoarthritis, unspecified site: Secondary | ICD-10-CM | POA: Diagnosis not present

## 2016-01-31 DIAGNOSIS — B1920 Unspecified viral hepatitis C without hepatic coma: Secondary | ICD-10-CM | POA: Diagnosis not present

## 2016-01-31 DIAGNOSIS — F411 Generalized anxiety disorder: Secondary | ICD-10-CM | POA: Diagnosis not present

## 2016-01-31 DIAGNOSIS — Z96649 Presence of unspecified artificial hip joint: Secondary | ICD-10-CM | POA: Diagnosis not present

## 2016-02-07 DIAGNOSIS — M797 Fibromyalgia: Secondary | ICD-10-CM | POA: Diagnosis not present

## 2016-02-07 DIAGNOSIS — I10 Essential (primary) hypertension: Secondary | ICD-10-CM | POA: Diagnosis not present

## 2016-02-07 DIAGNOSIS — E119 Type 2 diabetes mellitus without complications: Secondary | ICD-10-CM | POA: Diagnosis not present

## 2016-02-07 DIAGNOSIS — M1611 Unilateral primary osteoarthritis, right hip: Secondary | ICD-10-CM | POA: Diagnosis not present

## 2016-02-13 DIAGNOSIS — Z471 Aftercare following joint replacement surgery: Secondary | ICD-10-CM | POA: Diagnosis not present

## 2016-02-13 DIAGNOSIS — E119 Type 2 diabetes mellitus without complications: Secondary | ICD-10-CM | POA: Diagnosis not present

## 2016-02-14 DIAGNOSIS — E119 Type 2 diabetes mellitus without complications: Secondary | ICD-10-CM | POA: Diagnosis not present

## 2016-02-14 DIAGNOSIS — Z471 Aftercare following joint replacement surgery: Secondary | ICD-10-CM | POA: Diagnosis not present

## 2016-02-19 DIAGNOSIS — Z471 Aftercare following joint replacement surgery: Secondary | ICD-10-CM | POA: Diagnosis not present

## 2016-02-19 DIAGNOSIS — E119 Type 2 diabetes mellitus without complications: Secondary | ICD-10-CM | POA: Diagnosis not present

## 2016-02-22 DIAGNOSIS — E119 Type 2 diabetes mellitus without complications: Secondary | ICD-10-CM | POA: Diagnosis not present

## 2016-02-22 DIAGNOSIS — Z471 Aftercare following joint replacement surgery: Secondary | ICD-10-CM | POA: Diagnosis not present

## 2016-02-25 DIAGNOSIS — Z471 Aftercare following joint replacement surgery: Secondary | ICD-10-CM | POA: Diagnosis not present

## 2016-02-25 DIAGNOSIS — E119 Type 2 diabetes mellitus without complications: Secondary | ICD-10-CM | POA: Diagnosis not present

## 2016-03-13 DIAGNOSIS — Z0000 Encounter for general adult medical examination without abnormal findings: Secondary | ICD-10-CM | POA: Diagnosis not present

## 2016-03-13 DIAGNOSIS — R7309 Other abnormal glucose: Secondary | ICD-10-CM | POA: Diagnosis not present

## 2016-03-13 DIAGNOSIS — E119 Type 2 diabetes mellitus without complications: Secondary | ICD-10-CM | POA: Diagnosis not present

## 2016-03-13 DIAGNOSIS — M1990 Unspecified osteoarthritis, unspecified site: Secondary | ICD-10-CM | POA: Diagnosis not present

## 2016-03-13 DIAGNOSIS — N390 Urinary tract infection, site not specified: Secondary | ICD-10-CM | POA: Diagnosis not present

## 2016-03-13 DIAGNOSIS — Z1321 Encounter for screening for nutritional disorder: Secondary | ICD-10-CM | POA: Diagnosis not present

## 2016-03-13 DIAGNOSIS — I10 Essential (primary) hypertension: Secondary | ICD-10-CM | POA: Diagnosis not present

## 2016-03-14 DIAGNOSIS — M1990 Unspecified osteoarthritis, unspecified site: Secondary | ICD-10-CM | POA: Diagnosis not present

## 2016-03-14 DIAGNOSIS — Z471 Aftercare following joint replacement surgery: Secondary | ICD-10-CM | POA: Diagnosis not present

## 2016-03-14 DIAGNOSIS — F329 Major depressive disorder, single episode, unspecified: Secondary | ICD-10-CM | POA: Diagnosis not present

## 2016-03-14 DIAGNOSIS — M797 Fibromyalgia: Secondary | ICD-10-CM | POA: Diagnosis not present

## 2016-03-14 DIAGNOSIS — E119 Type 2 diabetes mellitus without complications: Secondary | ICD-10-CM | POA: Diagnosis not present

## 2016-03-14 DIAGNOSIS — I10 Essential (primary) hypertension: Secondary | ICD-10-CM | POA: Diagnosis not present

## 2016-03-17 DIAGNOSIS — I10 Essential (primary) hypertension: Secondary | ICD-10-CM | POA: Diagnosis not present

## 2016-03-17 DIAGNOSIS — M797 Fibromyalgia: Secondary | ICD-10-CM | POA: Diagnosis not present

## 2016-03-17 DIAGNOSIS — Z471 Aftercare following joint replacement surgery: Secondary | ICD-10-CM | POA: Diagnosis not present

## 2016-03-17 DIAGNOSIS — M1990 Unspecified osteoarthritis, unspecified site: Secondary | ICD-10-CM | POA: Diagnosis not present

## 2016-03-17 DIAGNOSIS — E119 Type 2 diabetes mellitus without complications: Secondary | ICD-10-CM | POA: Diagnosis not present

## 2016-03-17 DIAGNOSIS — F329 Major depressive disorder, single episode, unspecified: Secondary | ICD-10-CM | POA: Diagnosis not present

## 2016-03-20 DIAGNOSIS — B1920 Unspecified viral hepatitis C without hepatic coma: Secondary | ICD-10-CM | POA: Diagnosis not present

## 2016-03-20 DIAGNOSIS — M545 Low back pain: Secondary | ICD-10-CM | POA: Diagnosis not present

## 2016-03-20 DIAGNOSIS — F329 Major depressive disorder, single episode, unspecified: Secondary | ICD-10-CM | POA: Diagnosis not present

## 2016-03-20 DIAGNOSIS — I10 Essential (primary) hypertension: Secondary | ICD-10-CM | POA: Diagnosis not present

## 2016-03-20 DIAGNOSIS — E119 Type 2 diabetes mellitus without complications: Secondary | ICD-10-CM | POA: Diagnosis not present

## 2016-03-20 DIAGNOSIS — M1990 Unspecified osteoarthritis, unspecified site: Secondary | ICD-10-CM | POA: Diagnosis not present

## 2016-03-20 DIAGNOSIS — M797 Fibromyalgia: Secondary | ICD-10-CM | POA: Diagnosis not present

## 2016-03-20 DIAGNOSIS — Z471 Aftercare following joint replacement surgery: Secondary | ICD-10-CM | POA: Diagnosis not present

## 2016-03-20 DIAGNOSIS — R0609 Other forms of dyspnea: Secondary | ICD-10-CM | POA: Diagnosis not present

## 2016-04-03 ENCOUNTER — Ambulatory Visit (INDEPENDENT_AMBULATORY_CARE_PROVIDER_SITE_OTHER): Payer: Self-pay | Admitting: Physician Assistant

## 2016-04-04 ENCOUNTER — Inpatient Hospital Stay (HOSPITAL_COMMUNITY)
Admission: EM | Admit: 2016-04-04 | Discharge: 2016-04-08 | DRG: 917 | Disposition: A | Discharge status: Other Acute Inpatient Hospital | Payer: Medicare Other | Attending: Internal Medicine | Admitting: Internal Medicine

## 2016-04-04 ENCOUNTER — Encounter (HOSPITAL_COMMUNITY): Payer: Self-pay | Admitting: Emergency Medicine

## 2016-04-04 ENCOUNTER — Emergency Department (HOSPITAL_COMMUNITY): Payer: Medicare Other

## 2016-04-04 DIAGNOSIS — Z87891 Personal history of nicotine dependence: Secondary | ICD-10-CM

## 2016-04-04 DIAGNOSIS — Z7982 Long term (current) use of aspirin: Secondary | ICD-10-CM

## 2016-04-04 DIAGNOSIS — Z96641 Presence of right artificial hip joint: Secondary | ICD-10-CM | POA: Diagnosis present

## 2016-04-04 DIAGNOSIS — R197 Diarrhea, unspecified: Secondary | ICD-10-CM | POA: Diagnosis not present

## 2016-04-04 DIAGNOSIS — G4700 Insomnia, unspecified: Secondary | ICD-10-CM | POA: Diagnosis present

## 2016-04-04 DIAGNOSIS — F332 Major depressive disorder, recurrent severe without psychotic features: Secondary | ICD-10-CM | POA: Diagnosis not present

## 2016-04-04 DIAGNOSIS — K219 Gastro-esophageal reflux disease without esophagitis: Secondary | ICD-10-CM | POA: Diagnosis present

## 2016-04-04 DIAGNOSIS — Z66 Do not resuscitate: Secondary | ICD-10-CM | POA: Diagnosis present

## 2016-04-04 DIAGNOSIS — E119 Type 2 diabetes mellitus without complications: Secondary | ICD-10-CM | POA: Diagnosis present

## 2016-04-04 DIAGNOSIS — T50901A Poisoning by unspecified drugs, medicaments and biological substances, accidental (unintentional), initial encounter: Secondary | ICD-10-CM

## 2016-04-04 DIAGNOSIS — B1920 Unspecified viral hepatitis C without hepatic coma: Secondary | ICD-10-CM | POA: Diagnosis present

## 2016-04-04 DIAGNOSIS — M797 Fibromyalgia: Secondary | ICD-10-CM | POA: Diagnosis present

## 2016-04-04 DIAGNOSIS — F419 Anxiety disorder, unspecified: Secondary | ICD-10-CM | POA: Diagnosis present

## 2016-04-04 DIAGNOSIS — E440 Moderate protein-calorie malnutrition: Secondary | ICD-10-CM

## 2016-04-04 DIAGNOSIS — M6282 Rhabdomyolysis: Secondary | ICD-10-CM | POA: Diagnosis not present

## 2016-04-04 DIAGNOSIS — R45851 Suicidal ideations: Secondary | ICD-10-CM

## 2016-04-04 DIAGNOSIS — J690 Pneumonitis due to inhalation of food and vomit: Secondary | ICD-10-CM

## 2016-04-04 DIAGNOSIS — F322 Major depressive disorder, single episode, severe without psychotic features: Secondary | ICD-10-CM | POA: Diagnosis not present

## 2016-04-04 DIAGNOSIS — T50902A Poisoning by unspecified drugs, medicaments and biological substances, intentional self-harm, initial encounter: Secondary | ICD-10-CM | POA: Diagnosis present

## 2016-04-04 DIAGNOSIS — E870 Hyperosmolality and hypernatremia: Secondary | ICD-10-CM | POA: Diagnosis present

## 2016-04-04 DIAGNOSIS — Z853 Personal history of malignant neoplasm of breast: Secondary | ICD-10-CM | POA: Diagnosis not present

## 2016-04-04 DIAGNOSIS — R45850 Homicidal ideations: Secondary | ICD-10-CM | POA: Diagnosis present

## 2016-04-04 DIAGNOSIS — Z79899 Other long term (current) drug therapy: Secondary | ICD-10-CM | POA: Diagnosis not present

## 2016-04-04 DIAGNOSIS — R0602 Shortness of breath: Secondary | ICD-10-CM | POA: Diagnosis not present

## 2016-04-04 DIAGNOSIS — T424X2A Poisoning by benzodiazepines, intentional self-harm, initial encounter: Secondary | ICD-10-CM | POA: Diagnosis present

## 2016-04-04 DIAGNOSIS — F329 Major depressive disorder, single episode, unspecified: Secondary | ICD-10-CM | POA: Diagnosis present

## 2016-04-04 DIAGNOSIS — D649 Anemia, unspecified: Secondary | ICD-10-CM | POA: Diagnosis present

## 2016-04-04 DIAGNOSIS — R748 Abnormal levels of other serum enzymes: Secondary | ICD-10-CM | POA: Diagnosis not present

## 2016-04-04 DIAGNOSIS — Z91041 Radiographic dye allergy status: Secondary | ICD-10-CM

## 2016-04-04 DIAGNOSIS — Z8249 Family history of ischemic heart disease and other diseases of the circulatory system: Secondary | ICD-10-CM

## 2016-04-04 DIAGNOSIS — Z809 Family history of malignant neoplasm, unspecified: Secondary | ICD-10-CM

## 2016-04-04 DIAGNOSIS — Z791 Long term (current) use of non-steroidal anti-inflammatories (NSAID): Secondary | ICD-10-CM | POA: Diagnosis not present

## 2016-04-04 DIAGNOSIS — T50904A Poisoning by unspecified drugs, medicaments and biological substances, undetermined, initial encounter: Secondary | ICD-10-CM | POA: Diagnosis not present

## 2016-04-04 DIAGNOSIS — I10 Essential (primary) hypertension: Secondary | ICD-10-CM | POA: Diagnosis present

## 2016-04-04 DIAGNOSIS — Z9010 Acquired absence of unspecified breast and nipple: Secondary | ICD-10-CM | POA: Diagnosis not present

## 2016-04-04 DIAGNOSIS — M1611 Unilateral primary osteoarthritis, right hip: Secondary | ICD-10-CM | POA: Diagnosis present

## 2016-04-04 DIAGNOSIS — Y92099 Unspecified place in other non-institutional residence as the place of occurrence of the external cause: Secondary | ICD-10-CM

## 2016-04-04 DIAGNOSIS — E860 Dehydration: Secondary | ICD-10-CM | POA: Diagnosis present

## 2016-04-04 DIAGNOSIS — Z888 Allergy status to other drugs, medicaments and biological substances status: Secondary | ICD-10-CM

## 2016-04-04 DIAGNOSIS — T50902D Poisoning by unspecified drugs, medicaments and biological substances, intentional self-harm, subsequent encounter: Secondary | ICD-10-CM | POA: Diagnosis not present

## 2016-04-04 DIAGNOSIS — Z91048 Other nonmedicinal substance allergy status: Secondary | ICD-10-CM

## 2016-04-04 LAB — CBC WITH DIFFERENTIAL/PLATELET
Basophils Absolute: 0 10*3/uL (ref 0.0–0.1)
Basophils Relative: 0 %
EOS PCT: 0 %
Eosinophils Absolute: 0 10*3/uL (ref 0.0–0.7)
HEMATOCRIT: 24.7 % — AB (ref 36.0–46.0)
Hemoglobin: 7.7 g/dL — ABNORMAL LOW (ref 12.0–15.0)
Lymphocytes Relative: 25 %
Lymphs Abs: 2.8 10*3/uL (ref 0.7–4.0)
MCH: 29.3 pg (ref 26.0–34.0)
MCHC: 31.2 g/dL (ref 30.0–36.0)
MCV: 93.9 fL (ref 78.0–100.0)
MONO ABS: 0.9 10*3/uL (ref 0.1–1.0)
Monocytes Relative: 8 %
NEUTROS PCT: 67 %
Neutro Abs: 7.4 10*3/uL (ref 1.7–7.7)
PLATELETS: UNDETERMINED 10*3/uL (ref 150–400)
RBC: 2.63 MIL/uL — AB (ref 3.87–5.11)
RDW: 17 % — AB (ref 11.5–15.5)
WBC: 11.1 10*3/uL — AB (ref 4.0–10.5)

## 2016-04-04 LAB — URINALYSIS, ROUTINE W REFLEX MICROSCOPIC
GLUCOSE, UA: NEGATIVE mg/dL
Ketones, ur: NEGATIVE mg/dL
Nitrite: NEGATIVE
PH: 6 (ref 5.0–8.0)
Protein, ur: 100 mg/dL — AB
SPECIFIC GRAVITY, URINE: 1.029 (ref 1.005–1.030)

## 2016-04-04 LAB — COMPREHENSIVE METABOLIC PANEL
ALK PHOS: 96 U/L (ref 38–126)
ALT: 46 U/L (ref 14–54)
ANION GAP: 9 (ref 5–15)
AST: 58 U/L — ABNORMAL HIGH (ref 15–41)
Albumin: 3.4 g/dL — ABNORMAL LOW (ref 3.5–5.0)
BILIRUBIN TOTAL: 1 mg/dL (ref 0.3–1.2)
BUN: 36 mg/dL — ABNORMAL HIGH (ref 6–20)
CO2: 26 mmol/L (ref 22–32)
CREATININE: 0.65 mg/dL (ref 0.44–1.00)
Calcium: 8.7 mg/dL — ABNORMAL LOW (ref 8.9–10.3)
Chloride: 116 mmol/L — ABNORMAL HIGH (ref 101–111)
Glucose, Bld: 127 mg/dL — ABNORMAL HIGH (ref 65–99)
Potassium: 3.4 mmol/L — ABNORMAL LOW (ref 3.5–5.1)
SODIUM: 151 mmol/L — AB (ref 135–145)
Total Protein: 6.8 g/dL (ref 6.5–8.1)

## 2016-04-04 LAB — URINE MICROSCOPIC-ADD ON

## 2016-04-04 LAB — RAPID URINE DRUG SCREEN, HOSP PERFORMED
Amphetamines: NOT DETECTED
BARBITURATES: NOT DETECTED
BENZODIAZEPINES: POSITIVE — AB
COCAINE: NOT DETECTED
Opiates: NOT DETECTED
Tetrahydrocannabinol: NOT DETECTED

## 2016-04-04 LAB — CK: Total CK: 675 U/L — ABNORMAL HIGH (ref 38–234)

## 2016-04-04 LAB — ACETAMINOPHEN LEVEL: Acetaminophen (Tylenol), Serum: <10 ug/mL — ABNORMAL LOW (ref 10–30)

## 2016-04-04 LAB — SALICYLATE LEVEL

## 2016-04-04 LAB — I-STAT TROPONIN, ED: TROPONIN I, POC: 0 ng/mL (ref 0.00–0.08)

## 2016-04-04 LAB — LACTIC ACID, PLASMA
LACTIC ACID, VENOUS: 2.2 mmol/L — AB (ref 0.5–1.9)
LACTIC ACID, VENOUS: 3.6 mmol/L — AB (ref 0.5–1.9)

## 2016-04-04 MED ORDER — POTASSIUM CHLORIDE IN NACL 20-0.45 MEQ/L-% IV SOLN
INTRAVENOUS | Status: DC
  Administered 2016-04-04: 22:00:00 via INTRAVENOUS
  Filled 2016-04-04: qty 1000

## 2016-04-04 MED ORDER — SODIUM CHLORIDE 0.9 % IV BOLUS (SEPSIS)
1000.0000 mL | Freq: Once | INTRAVENOUS | Status: AC
  Administered 2016-04-04: 1000 mL via INTRAVENOUS

## 2016-04-04 MED ORDER — FLUOXETINE HCL 20 MG PO CAPS
60.0000 mg | ORAL_CAPSULE | Freq: Every day | ORAL | Status: DC
  Administered 2016-04-04 – 2016-04-08 (×5): 60 mg via ORAL
  Filled 2016-04-04 (×5): qty 3

## 2016-04-04 MED ORDER — METHOCARBAMOL 500 MG PO TABS
500.0000 mg | ORAL_TABLET | Freq: Three times a day (TID) | ORAL | Status: DC
  Administered 2016-04-04 – 2016-04-08 (×12): 500 mg via ORAL
  Filled 2016-04-04 (×12): qty 1

## 2016-04-04 MED ORDER — SODIUM CHLORIDE 0.9% FLUSH
3.0000 mL | Freq: Two times a day (BID) | INTRAVENOUS | Status: DC
  Administered 2016-04-07 (×2): 3 mL via INTRAVENOUS

## 2016-04-04 MED ORDER — CHOLESTYRAMINE 4 G PO PACK
4.0000 g | PACK | Freq: Two times a day (BID) | ORAL | Status: DC
  Administered 2016-04-04 – 2016-04-08 (×8): 4 g via ORAL
  Filled 2016-04-04 (×11): qty 1

## 2016-04-04 MED ORDER — VITAMIN B-1 100 MG PO TABS
100.0000 mg | ORAL_TABLET | Freq: Every day | ORAL | Status: DC
  Administered 2016-04-04 – 2016-04-08 (×5): 100 mg via ORAL
  Filled 2016-04-04 (×5): qty 1

## 2016-04-04 MED ORDER — BENZONATATE 100 MG PO CAPS
200.0000 mg | ORAL_CAPSULE | Freq: Three times a day (TID) | ORAL | Status: DC | PRN
  Administered 2016-04-05 – 2016-04-08 (×9): 200 mg via ORAL
  Filled 2016-04-04 (×9): qty 2

## 2016-04-04 MED ORDER — AMOXICILLIN-POT CLAVULANATE 875-125 MG PO TABS
1.0000 | ORAL_TABLET | Freq: Two times a day (BID) | ORAL | Status: DC
Start: 2016-04-04 — End: 2016-04-08
  Administered 2016-04-04 – 2016-04-08 (×8): 1 via ORAL
  Filled 2016-04-04 (×8): qty 1

## 2016-04-04 MED ORDER — PANTOPRAZOLE SODIUM 40 MG PO TBEC
40.0000 mg | DELAYED_RELEASE_TABLET | Freq: Every day | ORAL | Status: DC
  Administered 2016-04-04 – 2016-04-08 (×5): 40 mg via ORAL
  Filled 2016-04-04 (×5): qty 1

## 2016-04-04 MED ORDER — ONDANSETRON HCL 4 MG/2ML IJ SOLN
4.0000 mg | Freq: Once | INTRAMUSCULAR | Status: AC
  Administered 2016-04-04: 4 mg via INTRAVENOUS
  Filled 2016-04-04: qty 2

## 2016-04-04 MED ORDER — OXYCODONE-ACETAMINOPHEN 5-325 MG PO TABS
1.0000 | ORAL_TABLET | Freq: Two times a day (BID) | ORAL | Status: DC | PRN
  Administered 2016-04-04: 1 via ORAL
  Filled 2016-04-04 (×2): qty 1

## 2016-04-04 MED ORDER — FOLIC ACID 1 MG PO TABS
1.0000 mg | ORAL_TABLET | Freq: Every day | ORAL | Status: DC
  Administered 2016-04-04 – 2016-04-08 (×5): 1 mg via ORAL
  Filled 2016-04-04 (×5): qty 1

## 2016-04-04 MED ORDER — SODIUM CHLORIDE 0.9 % IV SOLN
Freq: Once | INTRAVENOUS | Status: AC
  Administered 2016-04-04: 17:00:00 via INTRAVENOUS

## 2016-04-04 MED ORDER — HEPARIN SODIUM (PORCINE) 5000 UNIT/ML IJ SOLN
5000.0000 [IU] | Freq: Three times a day (TID) | INTRAMUSCULAR | Status: DC
  Administered 2016-04-04 – 2016-04-06 (×7): 5000 [IU] via SUBCUTANEOUS
  Filled 2016-04-04 (×7): qty 1

## 2016-04-04 NOTE — ED Notes (Addendum)
Poison Control recommends ASPIRIN level. Primary RN made aware

## 2016-04-04 NOTE — ED Notes (Signed)
Pt can go to floor at 18:22

## 2016-04-04 NOTE — ED Notes (Signed)
Bed: WTR5 Expected date:  Expected time:  Means of arrival:  Comments: 

## 2016-04-04 NOTE — ED Notes (Signed)
Patient transported to X-ray 

## 2016-04-04 NOTE — ED Triage Notes (Signed)
Pt presents to ED via EMS after a suicide attempt 4 days ago. Pt reports she tried to kill herself by taking 34 tamazodone but it didn't work. Pt states the first 2 days she experienced "interesting devil stories" and she felt like "she was in one of her old houses". Pt reports she couldn't figure out if "she was dead and the devil was whispering to her" or if she was hallucinating. Pt is covered in fecal matter and reports she doesn't remember "when she started going".

## 2016-04-04 NOTE — ED Notes (Signed)
Bed: [facility]PT4 Expected date:  Expected time:  Means of arrival:  Comments: 

## 2016-04-04 NOTE — ED Notes (Signed)
Per PC Janell monitor for hyperthemia, hypotension, rhabdo, CNS/respiratory depression. Collect CMP, acetaminophen, salicylate, and EKG.

## 2016-04-04 NOTE — ED Provider Notes (Signed)
[facility] DEPT Provider Note   CSN: [id number] Arrival date & time: 04/04/16  1154     History   Chief Complaint Chief Complaint  Patient presents with  . Ingestion  . Suicidal    HPI [name] is a 65 y.o. female.  The history is provided by the patient, medical records and a relative. No language interpreter was used.  Ingestion  Pertinent negatives include no abdominal pain and no shortness of breath.   [name] is a 65 y.o. female  with a PMH of anxiety/depression, DM who presents to the Emergency Department from home for evaluation after intentional drug overdose on Monday (5 days ago). Patient states that she has depression and wanted to die on Monday, so she took 34 Temazepam. She states for the first 2 days she had "severe hallucinations" and was seeing double every where she looked. She states at some point over the first 2 days she fell off her bed and onto the floor, but could not get herself up. The next 2 days the hallucinations stopped, but she could not figure out if she was tender or not as she could not get up off of the floor. This morning, she found the strength to call her sister for help. Sister called EMS for help getting into the home and patient was found lying in her own feces. She denies suicidal ideations to me at present, however sister is very concerned and believes that she is still suicidal and struggling with severe depression. When asked if she wants to hurt anyone else, patient states "i want to strangle my sister". She still has associated weakness. Also endorses tremors. Denies chest pain, shortness of breath, abdominal pain, nausea, chills.   Past Medical History:  Diagnosis Date  . Anemia    2016 on iron for a while   . Anxiety   . ARTHRITIS 10/25/2007  . Arthritis   . Blood transfusion without reported diagnosis   . BREAST CANCER, HX OF 08/03/2006  . Cancer ([place])    hx of breast ca x 2 on right   . DEPRESSION 08/03/2006  . Diabetes  ([place])    prediabetic   . DIABETES MELLITUS, TYPE II 08/03/2006  . DYSPNEA ON EXERTION 11/21/2009   shortness of breath with exertion   . EPISTAXIS, RECURRENT 01/27/2008  . FIBROMYALGIA 01/13/2008  . GERD 04/30/2008  . HEPATITIS C 08/03/2006  . HIP PAIN, RIGHT 04/30/2007  . History of detached retina repair 02/12/2011  . HYPERTENSION 04/30/2008  . INSOMNIA-SLEEP DISORDER-UNSPEC 11/21/2009  . KNEE SPRAIN, ACUTE 01/27/2008  . LEG PAIN, LEFT 01/13/2008  . OSTEOPENIA 07/26/2007  . Persistent vomiting 08/17/2008  . Pneumonia    hx of   . Prediabetes     Patient Active Problem List   Diagnosis Date Noted  . Intentional drug overdose ([place]) 04/04/2016  . Osteoarthritis of right hip 01/25/2016  . 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 Cough 05/17/2014  . Lower back pain 04/19/2013  . Obesity (BMI 30-39.9) 06/14/2012  . Acute cholecystitis with chronic cholecystitis 06/04/2012  . 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 Chronic diarrhea 06/04/2012  . Poor venous access 06/04/2012  . Hemangioma of liver - left hepatic lobe 06/04/2012  . Right hip pain 09/12/2011  . History of detached retina repair 02/12/2011  . Preventative health care 02/08/2011  . INSOMNIA-SLEEP DISORDER-UNSPEC 11/21/2009  . DOE (dyspnea on exertion) 11/21/2009  . Essential hypertension 04/30/2008  . GERD 04/30/2008  .  EPISTAXIS, RECURRENT 01/27/2008  . KNEE SPRAIN, ACUTE 01/27/2008  . FIBROMYALGIA 01/13/2008  . LEG PAIN, LEFT 01/13/2008  . ARTHRITIS 10/25/2007  . OSTEOPENIA 07/26/2007  . HIP PAIN, RIGHT 04/30/2007  . HEPATITIS C 08/03/2006  . Diabetes ([place]) 08/03/2006  . DEPRESSION 08/03/2006  . BREAST CANCER, HX OF 08/03/2006    Past Surgical History:  Procedure Laterality Date  . ABDOMINAL HYSTERECTOMY    . BREAST LUMPECTOMY  2000   right with axillary LND  . CHOLECYSTECTOMY  06/05/2012   Procedure: LAPAROSCOPIC CHOLECYSTECTOMY;  Surgeon: Adin Hector, MD;  Location: [facility];   Service: General;  Laterality: N/A;  . LAPAROSCOPIC LYSIS OF ADHESIONS  06/05/2012   Procedure: LAPAROSCOPIC LYSIS OF ADHESIONS;  Surgeon: Adin Hector, MD;  Location: [facility];  Service: General;  Laterality: N/A;  . MASTECTOMY  2007   Dr. Margot Chimes  . s/p right broken leg with surgury as teen    . s/p thyroid FNA neg.    . TONSILLECTOMY    . TOTAL HIP ARTHROPLASTY Right 01/25/2016   Procedure: RIGHT TOTAL HIP ARTHROPLASTY ANTERIOR APPROACH;  Surgeon: Mcarthur Rossetti, MD;  Location: [facility];  Service: Orthopedics;  Laterality: Right;  . TUBAL LIGATION      OB History    No data available       Home Medications    Prior to Admission medications   Medication Sig Start Date End Date Taking? Authorizing Provider  buPROPion (WELLBUTRIN XL) 300 MG 24 hr tablet Take 1 tablet (300 mg total) by mouth every morning. Patient taking differently: Take 300 mg by mouth daily.  04/13/14  Yes Biagio Borg, MD  aspirin EC 325 MG EC tablet Take 1 tablet (325 mg total) by mouth daily with breakfast. Patient not taking: Reported on 04/04/2016 01/29/16   Mcarthur Rossetti, MD  cholestyramine Lucrezia Starch) 4 GM/DOSE powder Take 1 packet (4 g total) by mouth 2 (two) times daily with a meal. 04/13/14   Biagio Borg, MD  cyclobenzaprine (FLEXERIL) 10 MG tablet Take 1 tablet (10 mg total) by mouth 3 (three) times daily as needed for muscle spasms. 01/29/16   Mcarthur Rossetti, MD  FLUoxetine (PROZAC) 20 MG capsule Take 1 capsule (20 mg total) by mouth 3 (three) times daily. Patient taking differently: Take 60 mg by mouth daily.  04/13/14   Biagio Borg, MD  glucose blood (ONE TOUCH ULTRA TEST) test strip Use as directed once daily 03/04/13   Biagio Borg, MD  losartan (COZAAR) 25 MG tablet Take 1 tablet (25 mg total) by mouth daily. Patient not taking: Reported on 04/04/2016 04/13/14   Biagio Borg, MD  losartan (COZAAR) 50 MG tablet Take 50 mg by mouth daily.    Historical Provider, MD  methocarbamol  (ROBAXIN) 500 MG tablet Take 500 mg by mouth 3 (three) times daily.    Historical Provider, MD  omeprazole (PRILOSEC) 20 MG capsule Take 2 capsules (40 mg total) by mouth daily. Patient taking differently: Take 20 mg by mouth 2 (two) times daily.  04/13/14   Biagio Borg, MD  oxyCODONE-acetaminophen (ROXICET) 5-325 MG tablet Take 1-2 tablets by mouth every 4 (four) hours as needed. 01/29/16   Mcarthur Rossetti, MD  PEDIASURE (PEDIASURE) LIQD Take 237 mLs by mouth daily.    Historical Provider, MD  temazepam (RESTORIL) 30 MG capsule Take 30 mg by mouth at bedtime as needed for sleep.    Historical Provider, MD    [facility]  History Family History  Problem Relation Age of Onset  . Heart disease Mother   . Cancer Sister     breast    Social History Social History  Substance Use Topics  . Smoking status: Former Smoker    Packs/day: 4.00    Years: 40.00    Types: Cigarettes    Quit date: 06/30/2005  . Smokeless tobacco: Never Used     Comment: Pt smoked up to 4 ppd  . Alcohol use 0.0 oz/week     Comment: rare     Allergies   Ace inhibitors; Contrast media [iodinated diagnostic agents]; Iohexol; and Tape   Review of Systems Review of Systems  Constitutional: Negative for chills and fever.  HENT: Negative for congestion.   Eyes: Negative for visual disturbance.  Respiratory: Negative for cough and shortness of breath.   Cardiovascular: Negative.   Gastrointestinal: Negative for abdominal pain, nausea and vomiting.  Genitourinary: Negative for dysuria.  Musculoskeletal: Negative for arthralgias and myalgias.  Skin: Negative for wound.  Neurological: Positive for tremors and weakness.     Physical Exam Updated Vital Signs BP 122/65 (BP Location: Left Leg)   Pulse 82   Temp 97.6 F (36.4 C) (Oral)   Resp 18   Ht 5\' 1"  (1.549 m)   Wt 74.4 kg   SpO2 98%   BMI 30.99 kg/m   Physical Exam  Constitutional: She is oriented to person, place, and time. She appears  well-developed and well-nourished. No distress.  Malodorous.   HENT:  Head: Normocephalic and atraumatic.  Cardiovascular: Normal rate, normal heart sounds and intact distal pulses.   No murmur heard. Pulmonary/Chest: Effort normal and breath sounds normal. No respiratory distress.  Abdominal: Soft. Bowel sounds are normal. She exhibits no distension. There is no tenderness.  Genitourinary:  Genitourinary Comments: Soiled in feces.   Neurological: She is alert and oriented to person, place, and time.  Skin: Skin is warm and dry.  Psychiatric: She is agitated.  Nursing note and vitals reviewed.    ED Treatments / Results  Labs (all labs ordered are listed, but only abnormal results are displayed) Labs Reviewed  ACETAMINOPHEN LEVEL - Abnormal; Notable for the following:       Result Value   Acetaminophen (Tylenol), Serum <10 (*)    All other components within normal limits  COMPREHENSIVE METABOLIC PANEL - Abnormal; Notable for the following:    Sodium 151 (*)    Potassium 3.4 (*)    Chloride 116 (*)    Glucose, Bld 127 (*)    BUN 36 (*)    Calcium 8.7 (*)    Albumin 3.4 (*)    AST 58 (*)    All other components within normal limits  CBC WITH DIFFERENTIAL/PLATELET - Abnormal; Notable for the following:    WBC 11.1 (*)    RBC 2.63 (*)    Hemoglobin 7.7 (*)    HCT 24.7 (*)    RDW 17.0 (*)    All other components within normal limits  URINE RAPID DRUG SCREEN, HOSP PERFORMED - Abnormal; Notable for the following:    Benzodiazepines POSITIVE (*)    All other components within normal limits  URINALYSIS, ROUTINE W REFLEX MICROSCOPIC (NOT AT [facility]) - Abnormal; Notable for the following:    Color, Urine AMBER (*)    APPearance CLOUDY (*)    Hgb urine dipstick LARGE (*)    Bilirubin Urine SMALL (*)    Protein, ur 100 (*)    Leukocytes,  UA MODERATE (*)    All other components within normal limits  CK - Abnormal; Notable for the following:    Total CK 675 (*)    All other  components within normal limits  URINE MICROSCOPIC-ADD ON - Abnormal; Notable for the following:    Squamous Epithelial / LPF 0-5 (*)    Bacteria, UA MANY (*)    All other components within normal limits  ETHANOL  SALICYLATE LEVEL  I-STAT TROPOININ, ED    EKG  EKG Interpretation  Date/Time:  Friday April 04 2016 12:38:17 EDT Ventricular Rate:  95 PR Interval:    QRS Duration: 98 QT Interval:  376 QTC Calculation: 473 R Axis:   54 Text Interpretation:  Atrial fibrillation Ventricular premature complex Nonspecific repol abnormality, diffuse leads Possible atrial flutter pattern  Confirmed by Sherry Ruffing MD, CHRISTOPHER [phone]) on 04/04/2016 3:55:16 PM       Radiology Dg Chest 2 View  Result Date: 04/04/2016 CLINICAL DATA:  Shortness of breath today, suicide attempt 4 days ago, attempted overdose, history breast cancer, hypertension, former smoker, type II diabetes mellitus EXAM: CHEST  2 VIEW COMPARISON:  06/06/2012 FINDINGS: Normal heart size and pulmonary vascularity. Atherosclerotic calcification aorta. Large hiatal hernia. Hazy opacity at RIGHT lung base question infiltrate. Minimal central peribronchial thickening. Remaining lungs clear. No pleural effusion or pneumothorax. Bones unremarkable. IMPRESSION: Large hiatal hernia. Minimal bronchitic changes with RIGHT basilar opacity suspicious for infiltrate, question pneumonia versus aspiration. Electronically Signed   By: Lavonia Dana M.D.   On: 04/04/2016 17:15    Procedures Procedures (including critical care time)  Medications Ordered in ED Medications  sodium chloride 0.9 % bolus 1,000 mL (0 mLs Intravenous Stopped 04/04/16 1415)  sodium chloride 0.9 % bolus 1,000 mL (0 mLs Intravenous Stopped 04/04/16 1706)  0.9 %  sodium chloride infusion ( Intravenous New Bag/Given 04/04/16 1700)  ondansetron (ZOFRAN) injection 4 mg (4 mg Intravenous Given 04/04/16 1742)     Initial Impression / Assessment and Plan / ED Course  I have  reviewed the triage vital signs and the nursing notes.  Pertinent labs & imaging results that were available during my care of the patient were reviewed by me and considered in my medical decision making (see chart for details).  Clinical Course   [name] is a 65 y.o. female who presents to ED today by EMS for evaluation of intentional overdose. Patient states she took 34 temazepam on Monday. Since Monday, she has been lying on the floor and unable to get up. Upon arrival, patient was very agitated and soiled in feces requiring extensive cleaning. Given the amount of time patient was standing on, CK was obtained elevated at 675. 2L IV fluids started. CMP with elevated BUN, sodium and chloride. CBC with H&H if 7.7/24.7 (7.3/22.8 two months ago). CXR with right basilar opacity suspicious for infiltrate, given hx of being down for 5 days, this is likely 2/2 aspiration. EKG reviewed with attending, Dr.Tegeler, which shows possible a flutter. I don't see any documentation of afib/flutter in her chart. Will admit to hospitalist on cardiac monitoring. Patient will also need psych consult while in ED.   Patient seen by and discussed with Dr. Sherry Ruffing who agrees with treatment plan.   Final Clinical Impressions(s) / ED Diagnoses   Final diagnoses:  Overdose  Elevated CK  Intentional drug overdose, initial encounter [facility])    New Prescriptions New Prescriptions   No medications on file     [facility] Ninfa Giannelli, PA-C 04/04/16  [place], MD 04/04/16 2059

## 2016-04-04 NOTE — H&P (Signed)
History and Physical   [name] [id number] DOB: [birth year] DOA: 04/04/2016  Referring MD/NP/PA: ED provider, Pearlie Oyster, PA PCP: Horatio Pel, MD Outpatient Specialists: None Patient coming from: Home  Chief Complaint: Drug overdose  HPI: [name] is a 65 y.o. female with history of depression, anxiety, insomnia, and HTN brought by EMS after being found down in her home. She had taken 34 tablets of temazepam 4 days prior. She reports this was an attempt to kill herself due to many issues in her life including a poor relationship and resentment against her sister, divorce, lack of social supports, financial difficulties, and difficulty ambulating/poor health following R hip replacement in July. She remembers waking up in her bed several days after taking the medication, diffusely weak, having urinated and defecated on herself. Called her sister who called EMS when she was unable to enter the house. She denies any fall or focal pain. Has noticed a cough over the past hour or so. No fever.   ED Course: On arrival she appeared dehydrated and angry about having been brought to the ED. Afebrile, Na 151, BUN 36, Cr 0.65. CK was 675. Urinalysis had many bacteria but was considerably contaminated with feces per ED report. WBC 11.1, hgb 7.7. ECG was abnormal with significant artifact, read as possibly AFlutter or AFib. CXR showed opacity at the right base.  Review of Systems: No bleeding. Per HPI. All others reviewed and are negative.   Past Medical History:  Diagnosis Date  . Anemia    2016 on iron for a while   . Anxiety   . ARTHRITIS 10/25/2007  . Arthritis   . Blood transfusion without reported diagnosis   . BREAST CANCER, HX OF 08/03/2006  . Cancer ([place])    hx of breast ca x 2 on right   . DEPRESSION 08/03/2006  . Diabetes ([place])    prediabetic   . DIABETES MELLITUS, TYPE II 08/03/2006  . DYSPNEA ON EXERTION 11/21/2009   shortness of breath with exertion   . EPISTAXIS,  RECURRENT 01/27/2008  . FIBROMYALGIA 01/13/2008  . GERD 04/30/2008  . HEPATITIS C 08/03/2006  . HIP PAIN, RIGHT 04/30/2007  . History of detached retina repair 02/12/2011  . HYPERTENSION 04/30/2008  . INSOMNIA-SLEEP DISORDER-UNSPEC 11/21/2009  . KNEE SPRAIN, ACUTE 01/27/2008  . LEG PAIN, LEFT 01/13/2008  . OSTEOPENIA 07/26/2007  . Persistent vomiting 08/17/2008  . Pneumonia    hx of   . Prediabetes     Past Surgical History:  Procedure Laterality Date  . ABDOMINAL HYSTERECTOMY    . BREAST LUMPECTOMY  2000   right with axillary LND  . CHOLECYSTECTOMY  06/05/2012   Procedure: LAPAROSCOPIC CHOLECYSTECTOMY;  Surgeon: Adin Hector, MD;  Location: [facility];  Service: General;  Laterality: N/A;  . LAPAROSCOPIC LYSIS OF ADHESIONS  06/05/2012   Procedure: LAPAROSCOPIC LYSIS OF ADHESIONS;  Surgeon: Adin Hector, MD;  Location: [facility];  Service: General;  Laterality: N/A;  . MASTECTOMY  2007   Dr. Margot Chimes  . s/p right broken leg with surgury as teen    . s/p thyroid FNA neg.    . TONSILLECTOMY    . TOTAL HIP ARTHROPLASTY Right 01/25/2016   Procedure: RIGHT TOTAL HIP ARTHROPLASTY ANTERIOR APPROACH;  Surgeon: Mcarthur Rossetti, MD;  Location: [facility];  Service: Orthopedics;  Laterality: Right;  . TUBAL LIGATION     Former smoker, denies any alcohol or other illicit substances. No history of suicide attempts or homicidal ideation. Has  a warrant for criminal fleeing from the scene of an accident which she does not recall.   Allergies  Allergen Reactions  . Ace Inhibitors Cough  . Contrast Media [Iodinated Diagnostic Agents]     Hallucinations  . Iohexol      Desc: Pt has been pre medicated with prednisone before scans, pt is unsure of type of contrast she reacted to.   . Tape Rash    "Tears my skin all up"    Family History  Problem Relation Age of Onset  . Heart disease Mother   . Cancer Sister     breast   - Family history otherwise reviewed and not pertinent. "Everybody in my  family lives forever."   Prior to Admission medications   Medication Sig Start Date End Date Taking? Authorizing Provider  buPROPion (WELLBUTRIN XL) 300 MG 24 hr tablet Take 1 tablet (300 mg total) by mouth every morning. Patient taking differently: Take 300 mg by mouth daily.  04/13/14  Yes Biagio Borg, MD  aspirin EC 325 MG EC tablet Take 1 tablet (325 mg total) by mouth daily with breakfast. Patient not taking: Reported on 04/04/2016 01/29/16   Mcarthur Rossetti, MD  cholestyramine Lucrezia Starch) 4 GM/DOSE powder Take 1 packet (4 g total) by mouth 2 (two) times daily with a meal. 04/13/14   Biagio Borg, MD  cyclobenzaprine (FLEXERIL) 10 MG tablet Take 1 tablet (10 mg total) by mouth 3 (three) times daily as needed for muscle spasms. 01/29/16   Mcarthur Rossetti, MD  FLUoxetine (PROZAC) 20 MG capsule Take 1 capsule (20 mg total) by mouth 3 (three) times daily. Patient taking differently: Take 60 mg by mouth daily.  04/13/14   Biagio Borg, MD  glucose blood (ONE TOUCH ULTRA TEST) test strip Use as directed once daily 03/04/13   Biagio Borg, MD  losartan (COZAAR) 25 MG tablet Take 1 tablet (25 mg total) by mouth daily. Patient not taking: Reported on 04/04/2016 04/13/14   Biagio Borg, MD  losartan (COZAAR) 50 MG tablet Take 50 mg by mouth daily.    Historical Provider, MD  methocarbamol (ROBAXIN) 500 MG tablet Take 500 mg by mouth 3 (three) times daily.    Historical Provider, MD  omeprazole (PRILOSEC) 20 MG capsule Take 2 capsules (40 mg total) by mouth daily. Patient taking differently: Take 20 mg by mouth 2 (two) times daily.  04/13/14   Biagio Borg, MD  oxyCODONE-acetaminophen (ROXICET) 5-325 MG tablet Take 1-2 tablets by mouth every 4 (four) hours as needed. 01/29/16   Mcarthur Rossetti, MD  PEDIASURE (PEDIASURE) LIQD Take 237 mLs by mouth daily.    Historical Provider, MD  temazepam (RESTORIL) 30 MG capsule Take 30 mg by mouth at bedtime as needed for sleep.    Historical Provider,  MD    Physical Exam: Vitals:   04/04/16 1238 04/04/16 1330 04/04/16 1700 04/04/16 1830  BP: 113/62 113/62 122/65 (!) 111/34  Pulse: 94 83 82 102  Resp: 18 17 18 22   Temp: 97.7 F (36.5 C)  97.6 F (36.4 C)   TempSrc: Oral  Oral   SpO2: 96% 97% 98% 94%  Weight:      Height:       Constitutional: 65 y.o. female in no distress, hostile demeanor Eyes: Lids and conjunctivae normal, PERRL ENMT: Mucous membranes are tacky. Posterior pharynx clear of any exudate or lesions. Fair dentition.  Neck: normal, supple, no masses, no thyromegaly Respiratory: Non-labored  breathing without accessory muscle use. Decreased breath sounds at bilateral  bases, R>L. No crackles or wheezes. Occasional cough. Cardiovascular: Regular rate when listening x1 minute, no murmurs, rubs, or gallops. No JVD. No LE edema. 2+ pedal pulses. Abdomen: Normoactive bowel sounds. No tenderness, non-distended, and no masses palpated. No hepatosplenomegaly. GU: No indwelling catheter Musculoskeletal: No clubbing / cyanosis. No joint deformity upper and lower extremities. Good ROM, no contractures. Normal muscle tone.  Skin: Warm, dry. Diffuse bruising on right ankle without pain with ROM. No rashes, wounds, ulcers. No significant lesions noted.  Neurologic: CN II-XII grossly intact. Gait slow and unsteady. Speech normal. No focal deficits in motor strength or sensation in all extremities. +tremor at rest and with intention x4 extremities. Psychiatric: Alert and oriented x3. Poor judgment and impaired insight. Mood dysthymic with broad affect. Talkative.   Labs on Admission: I have personally reviewed following labs and imaging studies  CBC:  Recent Labs Lab 04/04/16 1312  WBC 11.1*  NEUTROABS 7.4  HGB 7.7*  HCT 24.7*  MCV 93.9  PLT PLATELET CLUMPS NOTED ON SMEAR, UNABLE TO ESTIMATE   Basic Metabolic Panel:  Recent Labs Lab 04/04/16 1312  NA 151*  K 3.4*  CL 116*  CO2 26  GLUCOSE 127*  BUN 36*  CREATININE  0.65  CALCIUM 8.7*   GFR: Estimated Creatinine Clearance: 63.8 mL/min (by C-G formula based on SCr of 0.65 mg/dL). Liver Function Tests:  Recent Labs Lab 04/04/16 1312  AST 58*  ALT 46  ALKPHOS 96  BILITOT 1.0  PROT 6.8  ALBUMIN 3.4*   No results for input(s): LIPASE, AMYLASE in the last 168 hours. No results for input(s): AMMONIA in the last 168 hours. Coagulation Profile: No results for input(s): INR, PROTIME in the last 168 hours. Cardiac Enzymes:  Recent Labs Lab 04/04/16 1312  CKTOTAL 675*   BNP (last 3 results) No results for input(s): PROBNP in the last 8760 hours. HbA1C: No results for input(s): HGBA1C in the last 72 hours. CBG: No results for input(s): GLUCAP in the last 168 hours. Lipid Profile: No results for input(s): CHOL, HDL, LDLCALC, TRIG, CHOLHDL, LDLDIRECT in the last 72 hours. Thyroid Function Tests: No results for input(s): TSH, T4TOTAL, FREET4, T3FREE, THYROIDAB in the last 72 hours. Anemia Panel: No results for input(s): VITAMINB12, FOLATE, FERRITIN, TIBC, IRON, RETICCTPCT in the last 72 hours. Urine analysis:    Component Value Date/Time   COLORURINE AMBER (A) 04/04/2016 1415   APPEARANCEUR CLOUDY (A) 04/04/2016 1415   LABSPEC 1.029 04/04/2016 1415   PHURINE 6.0 04/04/2016 1415   GLUCOSEU NEGATIVE 04/04/2016 1415   GLUCOSEU NEGATIVE 02/10/2011 1114   HGBUR LARGE (A) 04/04/2016 1415   BILIRUBINUR SMALL (A) 04/04/2016 1415   KETONESUR NEGATIVE 04/04/2016 1415   PROTEINUR 100 (A) 04/04/2016 1415   UROBILINOGEN 0.2 02/10/2011 1114   NITRITE NEGATIVE 04/04/2016 1415   LEUKOCYTESUR MODERATE (A) 04/04/2016 1415   Sepsis Labs: @LABRCNTIP ([id number]) )No results found for this or any previous visit (from the past 240 hour(s)).   Radiological Exams on Admission: Dg Chest 2 View  Result Date: 04/04/2016 CLINICAL DATA:  Shortness of breath today, suicide attempt 4 days ago, attempted overdose, history breast cancer,  hypertension, former smoker, type II diabetes mellitus EXAM: CHEST  2 VIEW COMPARISON:  06/06/2012 FINDINGS: Normal heart size and pulmonary vascularity. Atherosclerotic calcification aorta. Large hiatal hernia. Hazy opacity at RIGHT lung base question infiltrate. Minimal central peribronchial thickening. Remaining lungs clear. No pleural effusion or pneumothorax. Bones unremarkable. IMPRESSION:  Large hiatal hernia. Minimal bronchitic changes with RIGHT basilar opacity suspicious for infiltrate, question pneumonia versus aspiration. Electronically Signed   By: Lavonia Dana M.D.   On: 04/04/2016 17:15    EKG: Independently reviewed. Disagree with AFib as reported. Significant artifact throughout, though QRS complexes remain marching regularly through with single PVC. No ischemic changes.   Assessment/Plan Active Problems:   Intentional drug overdose ([place])   Intentional drug overdose/suicidal ideation/depression: Benzodiazepine over dose ~ 96hrs PTA. Temazepam half-life varies widely 3 - 19 hrs, so it is nearly out of her system, though still detected by UDS.  - Continues to have suicidal ideation, wishes actively that she was dead. - Psychiatry not called in ED as they prefer to have pt medically stable at time of consult. Should be stable for consult 10/7.  - Sitter, suicide precautions.  - CM consulted - Will hold wellbutrin in setting of benzo withdrawal, restart when no longer tremulous. - Continue high dose prozac  Aspiration pneumonia: RLL opacity with cough after OD. Leukocytosis possibly unrelated. - Augmentin  - Tessalon prn - Check lactate, monitor CBC - Follow blood cultures  Bacteriuria: Suspect contamination with feces as reported by EDP. No symptoms reported.  - Recollect UA with urine culture   Rhabdomyolysis: Mild, CK 675, troponin neg. Creatinine wnl. Due to being down for prolonged time and compounded by dehydration.  - IVFs as above in addition to po.  - Recheck CK in  AM  Abnormal ECG: No history of arrhythmia or palpitations. Read at admission as AFib, possible AFlutter, though QRS is regular and "flutter" waves are polymorphic, and patient is diffusely tremulous causing significant artifact.  - Telemetry - Repeat ECG in AM  Hypernatremia: Due to dehydration.  - s/p 2L NS in ED, will continue IVF's x 8 hours - Recheck BMP in AM  HTN:  - Hold cozaar during dehydration  Chronic back and right hip pain:  - Restart sparing use of analgesic and robaxin - Kpad prn  Insomnia: Restoril prescribed by PCP for this. Has tried trazodone and remeron and melatonin in past without improvement. I, of course, refused to order benzodiazepines. She declines other therapy.   DVT prophylaxis: Subcutaneous heparin  Code Status: DNR/DNI confirmed at admission  Family Communication: Patient strictly prohibitive of contacting family members. None at bedside.  Disposition Plan: Admit to telemetry floor for IVF's, psychiatry consultation.  Consults called: None; Psychiatry will need to be consulted when medically stable.   Admission status: Inpatient   Vance Gather, MD [facility] Pager [phone]  If 7PM-7AM, please contact night-coverage [email or web address] Password [facility] 04/04/2016, 7:44 PM

## 2016-04-04 NOTE — ED Triage Notes (Addendum)
Per EMS called by sister related to unable to get into pt house. Pt laying in feces. Pt took (34) 30 mg temazepam Monday in attempt to hurt self.

## 2016-04-04 NOTE — Progress Notes (Signed)
CRITICAL VALUE ALERT  Critical value received:  Lactic acid 2.2  Date of notification:  04/04/16  Time of notification:  2109  Critical value read back:Yes.    Nurse who received alert:  Reynold Bowen, RN  MD notified (1st page):  Walden Field  Time of first page:  2119  MD notified (2nd page):  Time of second page:  Responding MD: Walden Field  Time MD responded:  2122  No new orders given.

## 2016-04-04 NOTE — ED Notes (Signed)
SECURITY CLEARED BEFORE TRANSPORT

## 2016-04-05 DIAGNOSIS — F322 Major depressive disorder, single episode, severe without psychotic features: Secondary | ICD-10-CM

## 2016-04-05 DIAGNOSIS — R45851 Suicidal ideations: Secondary | ICD-10-CM

## 2016-04-05 DIAGNOSIS — M6282 Rhabdomyolysis: Secondary | ICD-10-CM

## 2016-04-05 DIAGNOSIS — F332 Major depressive disorder, recurrent severe without psychotic features: Secondary | ICD-10-CM

## 2016-04-05 DIAGNOSIS — T50902D Poisoning by unspecified drugs, medicaments and biological substances, intentional self-harm, subsequent encounter: Secondary | ICD-10-CM

## 2016-04-05 DIAGNOSIS — Z791 Long term (current) use of non-steroidal anti-inflammatories (NSAID): Secondary | ICD-10-CM

## 2016-04-05 DIAGNOSIS — E876 Hypokalemia: Secondary | ICD-10-CM

## 2016-04-05 DIAGNOSIS — Z87891 Personal history of nicotine dependence: Secondary | ICD-10-CM

## 2016-04-05 DIAGNOSIS — I10 Essential (primary) hypertension: Secondary | ICD-10-CM

## 2016-04-05 DIAGNOSIS — Z79899 Other long term (current) drug therapy: Secondary | ICD-10-CM

## 2016-04-05 LAB — CBC
HCT: 20.2 % — ABNORMAL LOW (ref 36.0–46.0)
HEMOGLOBIN: 6.4 g/dL — AB (ref 12.0–15.0)
MCH: 29 pg (ref 26.0–34.0)
MCHC: 31.7 g/dL (ref 30.0–36.0)
MCV: 91.4 fL (ref 78.0–100.0)
PLATELETS: 202 10*3/uL (ref 150–400)
RBC: 2.21 MIL/uL — AB (ref 3.87–5.11)
RDW: 16.9 % — ABNORMAL HIGH (ref 11.5–15.5)
WBC: 9.6 10*3/uL (ref 4.0–10.5)

## 2016-04-05 LAB — BASIC METABOLIC PANEL
ANION GAP: 6 (ref 5–15)
BUN: 17 mg/dL (ref 6–20)
CALCIUM: 7.9 mg/dL — AB (ref 8.9–10.3)
CO2: 22 mmol/L (ref 22–32)
CREATININE: 0.53 mg/dL (ref 0.44–1.00)
Chloride: 114 mmol/L — ABNORMAL HIGH (ref 101–111)
Glucose, Bld: 112 mg/dL — ABNORMAL HIGH (ref 65–99)
Potassium: 3.2 mmol/L — ABNORMAL LOW (ref 3.5–5.1)
SODIUM: 142 mmol/L (ref 135–145)

## 2016-04-05 LAB — CK: CK TOTAL: 362 U/L — AB (ref 38–234)

## 2016-04-05 LAB — MAGNESIUM: Magnesium: 1.6 mg/dL — ABNORMAL LOW (ref 1.7–2.4)

## 2016-04-05 LAB — HEMOGLOBIN AND HEMATOCRIT, BLOOD
HCT: 26.7 % — ABNORMAL LOW (ref 36.0–46.0)
HEMOGLOBIN: 8.6 g/dL — AB (ref 12.0–15.0)

## 2016-04-05 LAB — PREPARE RBC (CROSSMATCH)

## 2016-04-05 MED ORDER — GABAPENTIN 100 MG PO CAPS
200.0000 mg | ORAL_CAPSULE | Freq: Every day | ORAL | Status: DC
  Administered 2016-04-05 – 2016-04-07 (×3): 200 mg via ORAL
  Filled 2016-04-05 (×3): qty 2

## 2016-04-05 MED ORDER — SACCHAROMYCES BOULARDII 250 MG PO CAPS
250.0000 mg | ORAL_CAPSULE | Freq: Two times a day (BID) | ORAL | Status: DC
Start: 2016-04-05 — End: 2016-04-08
  Administered 2016-04-05 – 2016-04-08 (×7): 250 mg via ORAL
  Filled 2016-04-05 (×7): qty 1

## 2016-04-05 MED ORDER — ACETAMINOPHEN 325 MG PO TABS
650.0000 mg | ORAL_TABLET | Freq: Four times a day (QID) | ORAL | Status: DC | PRN
  Administered 2016-04-05: 650 mg via ORAL
  Filled 2016-04-05: qty 2

## 2016-04-05 MED ORDER — SODIUM CHLORIDE 0.9 % IV SOLN
INTRAVENOUS | Status: DC
  Administered 2016-04-05 – 2016-04-08 (×5): via INTRAVENOUS

## 2016-04-05 MED ORDER — MAGNESIUM SULFATE 2 GM/50ML IV SOLN
2.0000 g | Freq: Once | INTRAVENOUS | Status: AC
Start: 2016-04-05 — End: 2016-04-05
  Administered 2016-04-05: 2 g via INTRAVENOUS
  Filled 2016-04-05: qty 50

## 2016-04-05 MED ORDER — OXYCODONE HCL 5 MG PO TABS
5.0000 mg | ORAL_TABLET | Freq: Two times a day (BID) | ORAL | Status: DC | PRN
  Administered 2016-04-05 – 2016-04-08 (×6): 5 mg via ORAL
  Filled 2016-04-05 (×6): qty 1

## 2016-04-05 MED ORDER — SODIUM CHLORIDE 0.9 % IV SOLN
Freq: Once | INTRAVENOUS | Status: AC
  Administered 2016-04-05: 11:00:00 via INTRAVENOUS

## 2016-04-05 MED ORDER — FUROSEMIDE 10 MG/ML IJ SOLN
40.0000 mg | Freq: Once | INTRAMUSCULAR | Status: AC
  Administered 2016-04-05: 40 mg via INTRAVENOUS
  Filled 2016-04-05: qty 4

## 2016-04-05 MED ORDER — OXYCODONE HCL 5 MG PO TABS
5.0000 mg | ORAL_TABLET | Freq: Once | ORAL | Status: AC
  Administered 2016-04-05: 5 mg via ORAL
  Filled 2016-04-05: qty 1

## 2016-04-05 MED ORDER — POTASSIUM CHLORIDE CRYS ER 20 MEQ PO TBCR
40.0000 meq | EXTENDED_RELEASE_TABLET | ORAL | Status: AC
  Administered 2016-04-05 (×2): 40 meq via ORAL
  Filled 2016-04-05 (×2): qty 2

## 2016-04-05 NOTE — Progress Notes (Signed)
Chaplain visit the result of a Spiritual consult in [name]'s chart.  [name] openly and readily admits she tried to kill herself, that her attempt was pre-planned, and her anger initially that she did not die. She reports that she no longer envisions herself trying to kill herself again. She sat down and listed the problems in her life and when the list got to double digits, she concluded it was time to end her life. Her depression is related to family issues - being dependent on a sister's care and "suffering" at having to please her. She indicates she has trouble making friends and keeping outside contacts. She spent days "on the edge of death, but something would not allow her to die."   [name] is a [name] but is not associated presently with any community of faith. She acknowledges a desire to have closer ties with those who believe as she does.  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 Her fears of abandonment is great. Her fear of not being accepted for who she has become is also great. She is in the venting stage of her recovery. It is important to let her vent to get it all out. She appreciates the holistic medicine that [facility] practices that includes medical, mental, social and spiritual care providers. She reports it may help her cope. Being a Sports coach, she is aware of medical, social and spiritual assessment tools and may be overly cautious in relating her situation.  Page the chaplain immediately if [name] needs or requests further spiritual matters. Based on this visit, the spiritual consult in her chart has been marked completed.  [name]. [name], [name]

## 2016-04-05 NOTE — Progress Notes (Signed)
CRITICAL VALUE ALERT  Critical value received:  Hgb 6.4  Date of notification:  04/05/16  Time of notification: 0617  Critical value read back:Yes.    Nurse who received alert:  Reynold Bowen, RN  MD notified (1st page):  Walden Field  Time of first page:  0626  MD notified (2nd page):  Time of second page:  Responding MD:  Walden Field  Time MD responded:  [phone]  On call will pass this information off to primary hospitalist this AM>

## 2016-04-05 NOTE — Progress Notes (Addendum)
[facility] PROGRESS NOTE  [name] [id number] DOB: 1951-06-19 DOA: 04/04/2016 PCP: Horatio Pel, MD  Interim summary and HPI 65 y.o. female with history of depression, anxiety, insomnia, and HTN brought by EMS after being found down in her home. She had taken 34 tablets of temazepam 4 days prior to admission in attempt to kill herself. She reported having due to many issues in her life including a poor relationship and resentment against her sister, divorce, lack of social supports, financial difficulties, and difficulty ambulating/poor health following R hip replacement in July. She remembers waking up in her bed several days after taking the medication, feeling diffusely weak and having urinated and defecated on herself. Called her sister who called EMS when she was unable to enter the house. She denies any fall or focal pain. Has noticed a cough, no fever.    ED Course: On arrival she appeared dehydrated and angry about having been brought to the ED. Afebrile, Na 151, BUN 36, Cr 0.65. CK was 675. Urinalysis had many bacteria but was considerably contaminated with feces per ED report. WBC 11.1, hgb 7.7. ECG was abnormal with significant artifact, read as possibly AFlutter or AFib. CXR showed opacity at the right base.  Assessment/Plan: Intentional drug overdose/suicidal ideation/depression: Benzodiazepine over dose ~ 96hrs PTA. Temazepam half-life varies widely 3 - 19 hrs, so it is nearly out of her system, though still detected by UDS.  - Continues to have suicidal ideation, wishes actively to be dead. Positive flat affect. - Psychiatry consulted this morning  -continue Sitter, suicide precautions.  - Will follow psychiatry recommendations  - Continue prozac  Aspiration pneumonia: RLL opacity with cough after OD.  -will continue Augmentin (plan is to treat for 7 days total) - continue Tessalon prn -follow cx data and continue supportive care -patient reports severe  diarrhea with use of abx's; will add probiotics and continue use of home cholestyramine.  Bacteriuria:  -patient w/o dysuria  -receiving augmentin for PNA -will follow repeated UA and urine culture -continue IVF's   Rhabdomyolysis: Mild, CK 675, troponin neg. Creatinine wnl. Due to being down for prolonged time and compounded by dehydration.  -will continue IVFs   Abnormal ECG: No history of arrhythmia or palpitations. -no abnormalities seen on Telemetry so far; will watch for another 24 hours and discontinue if remains normal. -Repeated ECG today normal  -will replete Mg and potassium  Dehydration with Hypernatremia: Due to dehydration.  -s/p 2L NS in ED -will continue IVF's (will adjust rate) -sodium 142 today -will Recheck BMP in AM  HTN:  -patient presented dehydrated and BP is soft -will continue holding cozaar for now  Chronic back and right hip pain:  - continue sparing use of analgesic and robaxin - Kpad prn  Insomnia: Restoril prescribed by PCP for this. Has tried trazodone and remeron and melatonin in past without improvement. -given use of this agent to kill herself, will avoid benzo's -per psych rec's will use neurontin 200 mg QHS  Acute on chronic anemia: patient with low Hgb since her surgery in July. -no signs of acute bleeding  -Hgb down to 6.4 today -will transfuse 2 units of PRBC's -follow Hgb trend -started on niferex  Code Status: DNR Family Communication: no family at bedside  Disposition Plan: will need inpatient psychiatry facility placement once medically stable. Will transfuse 2 units of PRBC's, continue antibiotics for Asp PNA and continue supportive care/IVF's.    Consultants:  Psychiatry   Procedures:  See below for  x-ray reports   Antibiotics:  Augmentin 10/6  HPI/Subjective: Afebrile, no CP and no SOB currently. Patient still with ongoing SI and passive suicidal thoughts. No nausea, no vomiting.  Objective: Vitals:    04/05/16 1130 04/05/16 1410  BP: (!) 115/47 (!) 112/49  Pulse: (!) 105 91  Resp: 16 18  Temp: 98.9 F (37.2 C) 98.7 F (37.1 C)    Intake/Output Summary (Last 24 hours) at 04/05/16 1455 Last data filed at 04/05/16 1410  Gross per 24 hour  Intake          1358.75 ml  Output                0 ml  Net          1358.75 ml   Filed Weights   04/04/16 1212  Weight: 74.4 kg (164 lb)    Exam:   General:  Afebrile, AAOX3, still with SI and with very flat affect. No CP, no SOB.  Cardiovascular: S1 and s2, no rubs, no gallops  Respiratory: CTA bilaterally  Abdomen: soft, NT, ND, positive BS  Musculoskeletal: no edema, no cyanosis   Data Reviewed: Basic Metabolic Panel:  Recent Labs Lab 04/04/16 1312 04/05/16 0553  NA 151* 142  K 3.4* 3.2*  CL 116* 114*  CO2 26 22  GLUCOSE 127* 112*  BUN 36* 17  CREATININE 0.65 0.53  CALCIUM 8.7* 7.9*  MG  --  1.6*   Liver Function Tests:  Recent Labs Lab 04/04/16 1312  AST 58*  ALT 46  ALKPHOS 96  BILITOT 1.0  PROT 6.8  ALBUMIN 3.4*   CBC:  Recent Labs Lab 04/04/16 1312 04/05/16 0553  WBC 11.1* 9.6  NEUTROABS 7.4  --   HGB 7.7* 6.4*  HCT 24.7* 20.2*  MCV 93.9 91.4  PLT PLATELET CLUMPS NOTED ON SMEAR, UNABLE TO ESTIMATE 202   Cardiac Enzymes:  Recent Labs Lab 04/04/16 1312 04/05/16 0553  CKTOTAL 675* 362*   Studies: Dg Chest 2 View  Result Date: 04/04/2016 CLINICAL DATA:  Shortness of breath today, suicide attempt 4 days ago, attempted overdose, history breast cancer, hypertension, former smoker, type II diabetes mellitus EXAM: CHEST  2 VIEW COMPARISON:  06/06/2012 FINDINGS: Normal heart size and pulmonary vascularity. Atherosclerotic calcification aorta. Large hiatal hernia. Hazy opacity at RIGHT lung base question infiltrate. Minimal central peribronchial thickening. Remaining lungs clear. No pleural effusion or pneumothorax. Bones unremarkable. IMPRESSION: Large hiatal hernia. Minimal bronchitic changes with  RIGHT basilar opacity suspicious for infiltrate, question pneumonia versus aspiration. Electronically Signed   By: Lavonia Dana M.D.   On: 04/04/2016 17:15    Scheduled Meds: . amoxicillin-clavulanate  1 tablet Oral Q12H  . cholestyramine  4 g Oral BID WC  . FLUoxetine  60 mg Oral Daily  . folic acid  1 mg Oral Daily  . gabapentin  200 mg Oral QHS  . heparin  5,000 Units Subcutaneous Q8H  . methocarbamol  500 mg Oral TID  . pantoprazole  40 mg Oral Daily  . saccharomyces boulardii  250 mg Oral BID  . sodium chloride flush  3 mL Intravenous Q12H  . thiamine  100 mg Oral Daily   Continuous Infusions: . sodium chloride 125 mL/hr at 04/05/16 [id number]    Principal Problem:   Major depressive disorder, recurrent episode, severe (HCC) Active Problems:   Major depressive disorder, single episode   Essential hypertension   Arthritis of right hip   Status post total replacement of right hip  Intentional drug overdose ([place])   Suicidal ideation   Non-traumatic rhabdomyolysis    Time spent: 30 minutes    Barton Dubois  [facility] Pager [phone]. If 7PM-7AM, please contact night-coverage at [email or web address], password [facility] 04/05/2016, 2:55 PM  LOS: 1 day

## 2016-04-05 NOTE — Consult Note (Signed)
[facility] Consult   Reason for Consult: depression, intentional overdose Referring Physician:  Dr. Dyann Kief Patient Identification: [name] MRN:  [id number] Principal Diagnosis: Major depressive disorder, recurrent episode, severe ([place]) Diagnosis:   Patient Active Problem List   Diagnosis Date Noted  . Major depressive disorder, single episode [F32.9] 08/03/2006    Priority: High  . Major depressive disorder, recurrent episode, severe ([place]) [F33.2] 04/05/2016  . Intentional drug overdose ([place]) [T50.902A] 04/04/2016  . Suicidal ideation [R45.851] 04/04/2016  . Non-traumatic rhabdomyolysis [M62.82] 04/04/2016  . Osteoarthritis of right hip [M16.11] 01/25/2016  . 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 Cough [R05] 05/17/2014  . Lower back pain [M54.5] 04/19/2013  . Obesity (BMI 30-39.9) [E66.9] 06/14/2012  . Acute cholecystitis with chronic cholecystitis [K81.2] 06/04/2012  . 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 Chronic diarrhea [K52.9] 06/04/2012  . Poor venous access [I87.8] 06/04/2012  . Hemangioma of liver - left hepatic lobe [D18.03] 06/04/2012  . Right hip pain [M25.551] 09/12/2011  . History of detached retina repair [id number], Z86.69] 02/12/2011  . Preventative health care [Z00.00] 02/08/2011  . INSOMNIA-SLEEP DISORDER-UNSPEC [G47.00] 11/21/2009  . DOE (dyspnea on exertion) [R06.09] 11/21/2009  . Essential hypertension [I10] 04/30/2008  . GERD [K21.9] 04/30/2008  . EPISTAXIS, RECURRENT [R04.0] 01/27/2008  . KNEE SPRAIN, ACUTE [IMO0002] 01/27/2008  . FIBROMYALGIA [IMO0001] 01/13/2008  . LEG PAIN, LEFT [M79.609] 01/13/2008  . Arthritis of right hip [M16.11] 10/25/2007  . OSTEOPENIA [M89.9, M94.9] 07/26/2007  . HIP PAIN, RIGHT [M25.559] 04/30/2007  . HEPATITIS C [B17.10] 08/03/2006  . Diabetes ([place]) [E11.9] 08/03/2006  . BREAST CANCER, HX OF [Z85.3] 08/03/2006    Total Time spent with patient: 45  minutes  Subjective:   VAUDA SALVUCCI is a 65 y.o. female patient admitted with intentional overdose.  HPI: Thanks for asking me to do a psychiatric consultation on [name], a 65 y.o. female with history of MDD, Anxiety, Insomnia and HTN. She states that she was brought to the hospital for evaluation by EMS after she was found in her home. She states that she was tired of living and decided to take her life 5 days ago by overdosing on 34 tablets of temazepam prescribed by her PCP for chronic insomnia. She reports that she has got a lot going on in her life: poor relationship and resentment against her sister, divorce, lack of social supports, financial difficulties, and difficulty ambulating/poor health following R hip replacement in July. She endorses anhedonia, hopelessness, worthlessness, low energy level, severe insomnia and recurrent suicidal thoughts. Patient denies psychosis, delusions, drugs and alcohol abuse. She is unable to contract for safety.  Past Psychiatric History: anxiety  Risk to Self: Is patient at risk for suicide?: Yes Risk to Others:   Prior Inpatient Therapy:   Prior Outpatient Therapy:    Past Medical History:  Past Medical History:  Diagnosis Date  . Anemia    2016 on iron for a while   . Anxiety   . ARTHRITIS 10/25/2007  . Arthritis   . Blood transfusion without reported diagnosis   . BREAST CANCER, HX OF 08/03/2006  . Cancer ([place])    hx of breast ca x 2 on right   . DEPRESSION 08/03/2006  . Diabetes ([place])    prediabetic   . DIABETES MELLITUS, TYPE II 08/03/2006  . DYSPNEA ON EXERTION 11/21/2009   shortness of breath with exertion   . EPISTAXIS, RECURRENT 01/27/2008  . FIBROMYALGIA 01/13/2008  .  GERD 04/30/2008  . HEPATITIS C 08/03/2006  . HIP PAIN, RIGHT 04/30/2007  . History of detached retina repair 02/12/2011  . HYPERTENSION 04/30/2008  . INSOMNIA-SLEEP DISORDER-UNSPEC 11/21/2009  . KNEE SPRAIN, ACUTE 01/27/2008  . LEG PAIN, LEFT 01/13/2008  .  OSTEOPENIA 07/26/2007  . Persistent vomiting 08/17/2008  . Pneumonia    hx of   . Prediabetes     Past Surgical History:  Procedure Laterality Date  . ABDOMINAL HYSTERECTOMY    . BREAST LUMPECTOMY  2000   right with axillary LND  . CHOLECYSTECTOMY  06/05/2012   Procedure: LAPAROSCOPIC CHOLECYSTECTOMY;  Surgeon: Adin Hector, MD;  Location: [facility];  Service: General;  Laterality: N/A;  . LAPAROSCOPIC LYSIS OF ADHESIONS  06/05/2012   Procedure: LAPAROSCOPIC LYSIS OF ADHESIONS;  Surgeon: Adin Hector, MD;  Location: [facility];  Service: General;  Laterality: N/A;  . MASTECTOMY  2007   Dr. Margot Chimes  . s/p right broken leg with surgury as teen    . s/p thyroid FNA neg.    . TONSILLECTOMY    . TOTAL HIP ARTHROPLASTY Right 01/25/2016   Procedure: RIGHT TOTAL HIP ARTHROPLASTY ANTERIOR APPROACH;  Surgeon: Mcarthur Rossetti, MD;  Location: [facility];  Service: Orthopedics;  Laterality: Right;  . TUBAL LIGATION     Family History:  Family History  Problem Relation Age of Onset  . Heart disease Mother   . Cancer Sister     breast   Family Psychiatric  History:  Social History:  History  Alcohol Use  . 0.0 oz/week    Comment: rare     History  Drug Use  . Types: Benzodiazepines    Social History   Social History  . Marital status: Divorced    Spouse name: N/A  . Number of children: N/A  . Years of education: N/A   Occupational History  . Medical Editor    Social History Main Topics  . Smoking status: Former Smoker    Packs/day: 4.00    Years: 40.00    Types: Cigarettes    Quit date: 06/30/2005  . Smokeless tobacco: Never Used     Comment: Pt smoked up to 4 ppd  . Alcohol use 0.0 oz/week     Comment: rare  . Drug use:     Types: Benzodiazepines  . Sexual activity: Not Asked   Other Topics Concern  . None   Social History Narrative  . None   Additional Social History:    Allergies:   Allergies  Allergen Reactions  . Ace Inhibitors Cough  . Contrast Media  [Iodinated Diagnostic Agents]     Hallucinations  . Iohexol      Desc: Pt has been pre medicated with prednisone before scans, pt is unsure of type of contrast she reacted to.   . Tape Rash    "Tears my skin all up"    Labs:  Results for orders placed or performed during the hospital encounter of 04/04/16 (from the past 48 hour(s))  Salicylate level     Status: None   Collection Time: 04/04/16  1:07 PM  Result Value Ref Range   Salicylate Lvl <7.0 2.8 - 30.0 mg/dL  Acetaminophen level     Status: Abnormal   Collection Time: 04/04/16  1:12 PM  Result Value Ref Range   Acetaminophen (Tylenol), Serum <10 (L) 10 - 30 ug/mL    Comment:        THERAPEUTIC CONCENTRATIONS VARY SIGNIFICANTLY. A RANGE OF 10-30 ug/mL  MAY BE AN EFFECTIVE CONCENTRATION FOR MANY PATIENTS. HOWEVER, SOME ARE BEST TREATED AT CONCENTRATIONS OUTSIDE THIS RANGE. ACETAMINOPHEN CONCENTRATIONS >150 ug/mL AT 4 HOURS AFTER INGESTION AND >50 ug/mL AT 12 HOURS AFTER INGESTION ARE OFTEN ASSOCIATED WITH TOXIC REACTIONS.   Comprehensive metabolic panel     Status: Abnormal   Collection Time: 04/04/16  1:12 PM  Result Value Ref Range   Sodium 151 (H) 135 - 145 mmol/L   Potassium 3.4 (L) 3.5 - 5.1 mmol/L   Chloride 116 (H) 101 - 111 mmol/L   CO2 26 22 - 32 mmol/L   Glucose, Bld 127 (H) 65 - 99 mg/dL   BUN 36 (H) 6 - 20 mg/dL   Creatinine, Ser 0.65 0.44 - 1.00 mg/dL   Calcium 8.7 (L) 8.9 - 10.3 mg/dL   Total Protein 6.8 6.5 - 8.1 g/dL   Albumin 3.4 (L) 3.5 - 5.0 g/dL   AST 58 (H) 15 - 41 U/L   ALT 46 14 - 54 U/L   Alkaline Phosphatase 96 38 - 126 U/L   Total Bilirubin 1.0 0.3 - 1.2 mg/dL   GFR calc non Af Amer >60 >60 mL/min   GFR calc Af Amer >60 >60 mL/min    Comment: (NOTE) The eGFR has been calculated using the CKD EPI equation. This calculation has not been validated in all clinical situations. eGFR's persistently <60 mL/min signify possible Chronic Kidney Disease.    Anion gap 9 5 - 15  CBC with  Differential     Status: Abnormal   Collection Time: 04/04/16  1:12 PM  Result Value Ref Range   WBC 11.1 (H) 4.0 - 10.5 K/uL   RBC 2.63 (L) 3.87 - 5.11 MIL/uL   Hemoglobin 7.7 (L) 12.0 - 15.0 g/dL   HCT 24.7 (L) 36.0 - 46.0 %   MCV 93.9 78.0 - 100.0 fL   MCH 29.3 26.0 - 34.0 pg   MCHC 31.2 30.0 - 36.0 g/dL   RDW 17.0 (H) 11.5 - 15.5 %   Platelets PLATELET CLUMPS NOTED ON SMEAR, UNABLE TO ESTIMATE 150 - 400 K/uL   Neutrophils Relative % 67 %   Lymphocytes Relative 25 %   Monocytes Relative 8 %   Eosinophils Relative 0 %   Basophils Relative 0 %   Neutro Abs 7.4 1.7 - 7.7 K/uL   Lymphs Abs 2.8 0.7 - 4.0 K/uL   Monocytes Absolute 0.9 0.1 - 1.0 K/uL   Eosinophils Absolute 0.0 0.0 - 0.7 K/uL   Basophils Absolute 0.0 0.0 - 0.1 K/uL   RBC Morphology POLYCHROMASIA PRESENT    Smear Review LARGE PLATELETS PRESENT   CK     Status: Abnormal   Collection Time: 04/04/16  1:12 PM  Result Value Ref Range   Total CK 675 (H) 38 - 234 U/L  Urine rapid drug screen (hosp performed)     Status: Abnormal   Collection Time: 04/04/16  2:15 PM  Result Value Ref Range   Opiates NONE DETECTED NONE DETECTED   Cocaine NONE DETECTED NONE DETECTED   Benzodiazepines POSITIVE (A) NONE DETECTED   Amphetamines NONE DETECTED NONE DETECTED   Tetrahydrocannabinol NONE DETECTED NONE DETECTED   Barbiturates NONE DETECTED NONE DETECTED    Comment:        DRUG SCREEN FOR MEDICAL PURPOSES ONLY.  IF CONFIRMATION IS NEEDED FOR ANY PURPOSE, NOTIFY LAB WITHIN 5 DAYS.        LOWEST DETECTABLE LIMITS FOR URINE DRUG SCREEN Drug Class  Cutoff (ng/mL) Amphetamine      1000 Barbiturate      200 Benzodiazepine   703 Tricyclics       500 Opiates          300 Cocaine          300 THC              50   Urinalysis, Routine w reflex microscopic     Status: Abnormal   Collection Time: 04/04/16  2:15 PM  Result Value Ref Range   Color, Urine AMBER (A) YELLOW    Comment: BIOCHEMICALS MAY BE AFFECTED BY COLOR    APPearance CLOUDY (A) CLEAR   Specific Gravity, Urine 1.029 1.005 - 1.030   pH 6.0 5.0 - 8.0   Glucose, UA NEGATIVE NEGATIVE mg/dL   Hgb urine dipstick LARGE (A) NEGATIVE   Bilirubin Urine SMALL (A) NEGATIVE   Ketones, ur NEGATIVE NEGATIVE mg/dL   Protein, ur 100 (A) NEGATIVE mg/dL   Nitrite NEGATIVE NEGATIVE   Leukocytes, UA MODERATE (A) NEGATIVE  Urine microscopic-add on     Status: Abnormal   Collection Time: 04/04/16  2:15 PM  Result Value Ref Range   Squamous Epithelial / LPF 0-5 (A) NONE SEEN   WBC, UA 6-30 0 - 5 WBC/hpf   RBC / HPF 0-5 0 - 5 RBC/hpf   Bacteria, UA MANY (A) NONE SEEN   Urine-Other AMORPHOUS URATES/PHOSPHATES     Comment: MUCOUS PRESENT  I-stat troponin, ED     Status: None   Collection Time: 04/04/16  5:03 PM  Result Value Ref Range   Troponin i, poc 0.00 0.00 - 0.08 ng/mL   Comment 3            Comment: Due to the release kinetics of cTnI, a negative result within the first hours of the onset of symptoms does not rule out myocardial infarction with certainty. If myocardial infarction is still suspected, repeat the test at appropriate intervals.   Lactic acid, plasma     Status: Abnormal   Collection Time: 04/04/16  8:27 PM  Result Value Ref Range   Lactic Acid, Venous 2.2 (HH) 0.5 - 1.9 mmol/L    Comment: CRITICAL RESULT CALLED TO, READ BACK BY AND VERIFIED WITH: L HUDSON RN @ 2109 ON 04/04/16 BY C DAVIS   Lactic acid, plasma     Status: Abnormal   Collection Time: 04/04/16 11:20 PM  Result Value Ref Range   Lactic Acid, Venous 3.6 (HH) 0.5 - 1.9 mmol/L    Comment: CRITICAL RESULT CALLED TO, READ BACK BY AND VERIFIED WITH: L HUDSON RN @ 9381 ON 04/04/16 BY C DAVIS   Basic metabolic panel     Status: Abnormal   Collection Time: 04/05/16  5:53 AM  Result Value Ref Range   Sodium 142 135 - 145 mmol/L    Comment: RESULT REPEATED AND VERIFIED DELTA CHECK NOTED    Potassium 3.2 (L) 3.5 - 5.1 mmol/L   Chloride 114 (H) 101 - 111 mmol/L   CO2 22 22  - 32 mmol/L   Glucose, Bld 112 (H) 65 - 99 mg/dL   BUN 17 6 - 20 mg/dL   Creatinine, Ser 0.53 0.44 - 1.00 mg/dL   Calcium 7.9 (L) 8.9 - 10.3 mg/dL   GFR calc non Af Amer >60 >60 mL/min   GFR calc Af Amer >60 >60 mL/min    Comment: (NOTE) The eGFR has been calculated using the CKD EPI equation. This calculation  has not been validated in all clinical situations. eGFR's persistently <60 mL/min signify possible Chronic Kidney Disease.    Anion gap 6 5 - 15  CBC     Status: Abnormal   Collection Time: 04/05/16  5:53 AM  Result Value Ref Range   WBC 9.6 4.0 - 10.5 K/uL   RBC 2.21 (L) 3.87 - 5.11 MIL/uL   Hemoglobin 6.4 (LL) 12.0 - 15.0 g/dL    Comment: REPEATED TO VERIFY CRITICAL RESULT CALLED TO, READ BACK BY AND VERIFIED WITH: L HUDSON AT 0617 ON 10.07.2017 BY NBROOKS    HCT 20.2 (L) 36.0 - 46.0 %   MCV 91.4 78.0 - 100.0 fL   MCH 29.0 26.0 - 34.0 pg   MCHC 31.7 30.0 - 36.0 g/dL   RDW 16.9 (H) 11.5 - 15.5 %   Platelets 202 150 - 400 K/uL  CK     Status: Abnormal   Collection Time: 04/05/16  5:53 AM  Result Value Ref Range   Total CK 362 (H) 38 - 234 U/L  Magnesium     Status: Abnormal   Collection Time: 04/05/16  5:53 AM  Result Value Ref Range   Magnesium 1.6 (L) 1.7 - 2.4 mg/dL  Type and screen [place]     Status: None (Preliminary result)   Collection Time: 04/05/16  8:08 AM  Result Value Ref Range   ABO/RH(D) A POS    Antibody Screen NEG    Sample Expiration 04/08/2016    Unit Number [id number]    Blood Component Type RED CELLS,LR    Unit division 00    Status of Unit ALLOCATED    Transfusion Status OK TO TRANSFUSE    Crossmatch Result Compatible    Unit Number [id number]    Blood Component Type RED CELLS,LR    Unit division 00    Status of Unit ISSUED    Transfusion Status OK TO TRANSFUSE    Crossmatch Result Compatible   Prepare RBC     Status: None   Collection Time: 04/05/16  8:26 AM  Result Value Ref Range   Order  Confirmation ORDER PROCESSED BY BLOOD BANK     Current Facility-Administered Medications  Medication Dose Route Frequency Provider Last Rate Last Dose  . 0.9 %  sodium chloride infusion   Intravenous Continuous Ritta Slot, NP 125 mL/hr at 04/05/16 0523    . acetaminophen (TYLENOL) tablet 650 mg  650 mg Oral Q6H PRN Barton Dubois, MD   650 mg at 04/05/16 1052  . amoxicillin-clavulanate (AUGMENTIN) 875-125 MG per tablet 1 tablet  1 tablet Oral Q12H Patrecia Pour, MD   1 tablet at 04/05/16 0953  . benzonatate (TESSALON) capsule 200 mg  200 mg Oral TID PRN Patrecia Pour, MD   200 mg at 04/05/16 1314  . cholestyramine (QUESTRAN) packet 4 g  4 g Oral BID WC Patrecia Pour, MD   4 g at 04/05/16 1034  . FLUoxetine (PROZAC) capsule 60 mg  60 mg Oral Daily Patrecia Pour, MD   60 mg at 04/05/16 0953  . folic acid (FOLVITE) tablet 1 mg  1 mg Oral Daily Patrecia Pour, MD   1 mg at 04/05/16 0953  . furosemide (LASIX) injection 40 mg  40 mg Intravenous Once Barton Dubois, MD      . heparin injection 5,000 Units  5,000 Units Subcutaneous Q8H Patrecia Pour, MD   5,000 Units at 04/05/16 1314  . methocarbamol (ROBAXIN)  tablet 500 mg  500 mg Oral TID Patrecia Pour, MD   500 mg at 04/05/16 0953  . oxyCODONE (Oxy IR/ROXICODONE) immediate release tablet 5 mg  5 mg Oral Q12H PRN Barton Dubois, MD   5 mg at 04/05/16 1052  . pantoprazole (PROTONIX) EC tablet 40 mg  40 mg Oral Daily Patrecia Pour, MD   40 mg at 04/05/16 0953  . saccharomyces boulardii (FLORASTOR) capsule 250 mg  250 mg Oral BID Barton Dubois, MD   250 mg at 04/05/16 1052  . sodium chloride flush (NS) 0.9 % injection 3 mL  3 mL Intravenous Q12H Patrecia Pour, MD      . thiamine (VITAMIN B-1) tablet 100 mg  100 mg Oral Daily Patrecia Pour, MD   100 mg at 04/05/16 6010    Musculoskeletal: Strength & Muscle Tone: not tested Gait & Station: unsteady Patient leans: N/A  Psychiatric Specialty Exam: Physical Exam  Psychiatric: Her speech is normal. Her mood  appears anxious. She is withdrawn. Cognition and memory are normal. She expresses impulsivity. She exhibits a depressed mood. She expresses suicidal ideation. She expresses suicidal plans.    ROS  Blood pressure (!) 115/47, pulse (!) 105, temperature 98.9 F (37.2 C), temperature source Axillary, resp. rate 16, height 5' 1"  (1.549 m), weight 74.4 kg (164 lb), SpO2 95 %.Body mass index is 30.99 kg/m.  General Appearance: Casual  Eye Contact:  Good  Speech:  Clear and Coherent  Volume:  Decreased  Mood:  Depressed and Hopeless  Affect:  Constricted and Depressed  Thought Process:  Coherent and Descriptions of Associations: Intact  Orientation:  Full (Time, Place, and Person)  Thought Content:  Logical  Suicidal Thoughts:  Yes.  with intent/plan  Homicidal Thoughts:  No  Memory:  Immediate;   Good Recent;   Good Remote;   Good  Judgement:  Poor  Insight:  Shallow  Psychomotor Activity:  Psychomotor Retardation  Concentration:  Concentration: Fair and Attention Span: Fair  Recall:  Good  Fund of Knowledge:  Good  Language:  Good  Akathisia:  No  Handed:  Right  AIMS (if indicated):     Assets:  Communication Skills  ADL's:  Intact  Cognition:  WNL  Sleep:   poor     Treatment Plan Summary:  Crisis stabilization Continue 1:1 for safety, patient unable to contract for safety Daily contact with patient to assess and evaluate symptoms and progress in treatment, Medication management Continue Prozac 60 mg daily for depression Start Gabapentin 200 mg po qhs for anxiety/insomnia.  Disposition: Recommend psychiatric Inpatient admission when medically cleared. Supportive therapy provided about ongoing stressors. Needs geropsychiatric inpatient admission for stabilization  Corena Pilgrim, MD 04/05/2016 1:35 PM

## 2016-04-05 NOTE — Progress Notes (Signed)
CRITICAL VALUE ALERT  Critical value received:  Lactic acid 3.6  Date of notification:  04/04/16  Time of notification:  2354  Critical value read back:Yes.    Nurse who received alert:  Reynold Bowen, RN  MD notified (1st page):  Walden Field  Time of first page:  04/05/16 0007  MD notified (2nd page):  Time of second page:  Responding MD:  Walden Field  Time MD responded:  0040  No new orders given.

## 2016-04-06 DIAGNOSIS — F332 Major depressive disorder, recurrent severe without psychotic features: Secondary | ICD-10-CM

## 2016-04-06 LAB — CBC
HEMATOCRIT: 26.4 % — AB (ref 36.0–46.0)
Hemoglobin: 8.3 g/dL — ABNORMAL LOW (ref 12.0–15.0)
MCH: 28.5 pg (ref 26.0–34.0)
MCHC: 31.4 g/dL (ref 30.0–36.0)
MCV: 90.7 fL (ref 78.0–100.0)
PLATELETS: 200 10*3/uL (ref 150–400)
RBC: 2.91 MIL/uL — AB (ref 3.87–5.11)
RDW: 17.5 % — AB (ref 11.5–15.5)
WBC: 9.7 10*3/uL (ref 4.0–10.5)

## 2016-04-06 LAB — URINE CULTURE

## 2016-04-06 LAB — MAGNESIUM: Magnesium: 1.8 mg/dL (ref 1.7–2.4)

## 2016-04-06 LAB — BASIC METABOLIC PANEL
ANION GAP: 5 (ref 5–15)
BUN: 11 mg/dL (ref 6–20)
CALCIUM: 8 mg/dL — AB (ref 8.9–10.3)
CO2: 23 mmol/L (ref 22–32)
Chloride: 113 mmol/L — ABNORMAL HIGH (ref 101–111)
Creatinine, Ser: 0.56 mg/dL (ref 0.44–1.00)
GLUCOSE: 108 mg/dL — AB (ref 65–99)
POTASSIUM: 4.2 mmol/L (ref 3.5–5.1)
Sodium: 141 mmol/L (ref 135–145)

## 2016-04-06 MED ORDER — LOSARTAN POTASSIUM 50 MG PO TABS
25.0000 mg | ORAL_TABLET | Freq: Every day | ORAL | Status: DC
  Administered 2016-04-07 – 2016-04-08 (×2): 25 mg via ORAL
  Filled 2016-04-06 (×2): qty 1

## 2016-04-06 MED ORDER — POLYSACCHARIDE IRON COMPLEX 150 MG PO CAPS
150.0000 mg | ORAL_CAPSULE | Freq: Two times a day (BID) | ORAL | Status: DC
  Administered 2016-04-06: 150 mg via ORAL
  Filled 2016-04-06 (×3): qty 1

## 2016-04-06 MED ORDER — ENSURE ENLIVE PO LIQD
237.0000 mL | ORAL | Status: DC
  Administered 2016-04-06 – 2016-04-07 (×2): 237 mL via ORAL

## 2016-04-06 NOTE — Progress Notes (Signed)
Initial Nutrition Assessment  DOCUMENTATION CODES:   Severe malnutrition in context of acute illness/injury, Obesity unspecified  INTERVENTION:  Ensure Enlive po once daily, each supplement provides 350 kcal and 20 grams of protein.  Provided education on need for adequate protein and calories with meals.   NUTRITION DIAGNOSIS:   Malnutrition (Severe) related to acute illness as evidenced by 9 percent weight loss over 2.5 months, energy intake < or equal to 50% for > or equal to 5 days.  GOAL:   Patient will meet greater than or equal to 90% of their needs  MONITOR:   PO intake, Supplement acceptance, Weight trends, Labs, I & O's  REASON FOR ASSESSMENT:   Malnutrition Screening Tool    ASSESSMENT:   65 y.o. female with history of depression, anxiety, insomnia, and HTN brought by EMS after being found down in her home. She had taken 34 tablets of temazepam 4 days prior to admission in attempt to kill herself.   Patient reports that she had a hip replacement on 01/25/2016 and her appetite has been poor since then. She reports that she is not eating as much junk food since then because she doesn't have the taste for it (has cut out chocolate candy and ice cream). She is eating 50% less than usual but still attempts to eat healthy meals. Drinks 1 Ensure Plus daily at home. UBW had been 180 lbs before surgery, but was 320 lbs 3 years ago. She has lost 17 lbs (9% body weight) unintentionally over approximately 2.5 months (since 7/28), which is significant for time frame.   Patient is excited that she is losing weight. Educated her on the fact that fast and unintentional weight loss can lead to fast loss of muscle mass. Emphasized importance of adequate protein at each meal and snack.   Meal Completion: 75-100%  Medications reviewed and include: cholestyramine 4 grams BID with meals, folic acid 1 mg daily, pantoprazole, thiamine 100 mg daily, NS @ 100 ml/hr.  Labs reviewed: Chloride  113, Glucose 108.   Nutrition-Focused physical exam completed. Findings are no fat depletion, no muscle depletion, and no edema.   Patient meets criteria for severe acute malnutrition in setting of 9% weight loss over 2.5 months, intake </= 50% for >/= 5 days.   Diet Order:  Diet Heart Room service appropriate? Yes; Fluid consistency: Thin  Skin:  Wound (see comment) (MSAD to sacrum)  Last BM:  04/06/2016  Height:   Ht Readings from Last 1 Encounters:  04/04/16 5\' 1"  (1.549 m)    Weight:   Wt Readings from Last 1 Encounters:  04/04/16 164 lb (74.4 kg)    Ideal Body Weight:     BMI:  Body mass index is 30.99 kg/m.  Estimated Nutritional Needs:   Kcal:  [id number]  Protein:  75-90 grams  Fluid:  1.3-1.5 L/day  EDUCATION NEEDS:   Education needs addressed (Detriments of fast, unintentional weight loss. Need for adequate protein and calories. )  Willey Blade, MS, RD, LDN Pager: [phone] After Hours Pager: [phone]

## 2016-04-06 NOTE — Progress Notes (Signed)
[facility] PROGRESS NOTE  [name] [id number] DOB: April 01, 1951 DOA: 04/04/2016 PCP: Horatio Pel, MD  Interim summary and HPI 65 y.o. female with history of depression, anxiety, insomnia, and HTN brought by EMS after being found down in her home. She had taken 34 tablets of temazepam 4 days prior to admission in attempt to kill herself. She reported having due to many issues in her life including a poor relationship and resentment against her sister, divorce, lack of social supports, financial difficulties, and difficulty ambulating/poor health following R hip replacement in July. She remembers waking up in her bed several days after taking the medication, feeling diffusely weak and having urinated and defecated on herself. Called her sister who called EMS when she was unable to enter the house. She denies any fall or focal pain. Has noticed a cough, no fever.    ED Course: On arrival she appeared dehydrated and angry about having been brought to the ED. Afebrile, Na 151, BUN 36, Cr 0.65. CK was 675. Urinalysis had many bacteria but was considerably contaminated with feces per ED report. WBC 11.1, hgb 7.7. ECG was abnormal with significant artifact, read as possibly AFlutter or AFib. CXR showed opacity at the right base.  Assessment/Plan: Intentional drug overdose/suicidal ideation/depression: Benzodiazepine over dose ~ 96hrs PTA. Temazepam half-life varies widely 3 - 19 hrs, so it is nearly out of her system, though still detected by UDS.  - Continues to have suicidal ideation, wishes actively to be dead. Positive flat affect. - Psychiatry consulted this morning  -continue Sitter, suicide precautions.  - Will follow psychiatry recommendations  - Continue prozac  Aspiration pneumonia: RLL opacity with cough after OD.  -will continue Augmentin (plan is to treat for 7 days total) - continue Tessalon prn -follow cx data and continue supportive care -patient reports severe  diarrhea with use of abx's; will add probiotics and continue use of home cholestyramine.  Bacteriuria:  -patient w/o dysuria  -receiving augmentin for PNA -will follow repeated UA and urine culture -so far no signs of infection and denying any symptoms   Rhabdomyolysis: Mild, CK 675, troponin neg. Creatinine wnl. Due to being down for prolonged time and compounded by dehydration.  -will continue IVFs, but rate will be adjusted as patient is eating and drinking properly.  Abnormal ECG: No history of arrhythmia or palpitations. -no abnormalities seen on Telemetry -Repeated ECG today normal  -Mg and potassium WNL -will discontinue telemetry  Dehydration with Hypernatremia: Due to dehydration.  -s/p 2L NS in ED -will continue IVF's (will adjust rate) -sodium 141 today -will follow BMP intermittently  HTN:  -patient presented dehydrated and with soft BP  -stable now and BP rising -will resume her losartan on 10/9   Chronic back and right hip pain:  - continue sparing use of analgesic and robaxin - Kpad prn  Insomnia: Restoril prescribed by PCP for this. Has tried trazodone and remeron and melatonin in past without improvement. -given use of this agent to kill herself, will avoid benzo's -per psych rec's will use neurontin 200 mg QHS  Acute on chronic anemia: patient with low Hgb since her surgery in July. -no signs of acute bleeding  -Hgb 8.3 this morning after 2 units PRBC's transfused yesterday (10/7) -follow Hgb trend -started on niferex  Code Status: DNR Family Communication: no family at bedside  Disposition Plan: will need inpatient psychiatry facility placement once medically stable. S/p 2 units of PRBC's, with replete electrolytes and afebrile. Anticipate medical stability on  04/07/16.   Consultants:  Psychiatry   Procedures:  See below for x-ray reports   Antibiotics:  Augmentin 10/6  HPI/Subjective: Afebrile, no CP and no SOB currently. Patient  still with ongoing passive suicidal thoughts. No nausea, no vomiting. Improved mood.  Objective: Vitals:   04/06/16 0543 04/06/16 1319  BP: (!) 119/40 (!) 154/65  Pulse: 89 94  Resp: 16 18  Temp: 98.5 F (36.9 C) 99.1 F (37.3 C)    Intake/Output Summary (Last 24 hours) at 04/06/16 1450 Last data filed at 04/06/16 1300  Gross per 24 hour  Intake          4693.33 ml  Output                0 ml  Net          4693.33 ml   Filed Weights   04/04/16 1212  Weight: 74.4 kg (164 lb)    Exam:   General:  Afebrile, AAOX3, still with passive SI. Affect is better and patient engaging more in conversation/interview; improved mood. No CP, no SOB. Still with intermittent dry cough.  Cardiovascular: S1 and s2, no rubs, no gallops  Respiratory: CTA bilaterally  Abdomen: soft, NT, ND, positive BS  Musculoskeletal: no edema, no cyanosis   Data Reviewed: Basic Metabolic Panel:  Recent Labs Lab 04/04/16 1312 04/05/16 0553 04/06/16 0536  NA 151* 142 141  K 3.4* 3.2* 4.2  CL 116* 114* 113*  CO2 26 22 23   GLUCOSE 127* 112* 108*  BUN 36* 17 11  CREATININE 0.65 0.53 0.56  CALCIUM 8.7* 7.9* 8.0*  MG  --  1.6* 1.8   Liver Function Tests:  Recent Labs Lab 04/04/16 1312  AST 58*  ALT 46  ALKPHOS 96  BILITOT 1.0  PROT 6.8  ALBUMIN 3.4*   CBC:  Recent Labs Lab 04/04/16 1312 04/05/16 0553 04/05/16 2022 04/06/16 0536  WBC 11.1* 9.6  --  9.7  NEUTROABS 7.4  --   --   --   HGB 7.7* 6.4* 8.6* 8.3*  HCT 24.7* 20.2* 26.7* 26.4*  MCV 93.9 91.4  --  90.7  PLT PLATELET CLUMPS NOTED ON SMEAR, UNABLE TO ESTIMATE 202  --  200   Cardiac Enzymes:  Recent Labs Lab 04/04/16 1312 04/05/16 0553  CKTOTAL 675* 362*   Studies: Dg Chest 2 View  Result Date: 04/04/2016 CLINICAL DATA:  Shortness of breath today, suicide attempt 4 days ago, attempted overdose, history breast cancer, hypertension, former smoker, type II diabetes mellitus EXAM: CHEST  2 VIEW COMPARISON:  06/06/2012  FINDINGS: Normal heart size and pulmonary vascularity. Atherosclerotic calcification aorta. Large hiatal hernia. Hazy opacity at RIGHT lung base question infiltrate. Minimal central peribronchial thickening. Remaining lungs clear. No pleural effusion or pneumothorax. Bones unremarkable. IMPRESSION: Large hiatal hernia. Minimal bronchitic changes with RIGHT basilar opacity suspicious for infiltrate, question pneumonia versus aspiration. Electronically Signed   By: Lavonia Dana M.D.   On: 04/04/2016 17:15    Scheduled Meds: . amoxicillin-clavulanate  1 tablet Oral Q12H  . cholestyramine  4 g Oral BID WC  . feeding supplement (ENSURE ENLIVE)  237 mL Oral Q24H  . FLUoxetine  60 mg Oral Daily  . folic acid  1 mg Oral Daily  . gabapentin  200 mg Oral QHS  . heparin  5,000 Units Subcutaneous Q8H  . iron polysaccharides  150 mg Oral BID  . methocarbamol  500 mg Oral TID  . pantoprazole  40 mg Oral Daily  .  saccharomyces boulardii  250 mg Oral BID  . sodium chloride flush  3 mL Intravenous Q12H  . thiamine  100 mg Oral Daily   Continuous Infusions: . sodium chloride 100 mL/hr at 04/06/16 0607    Principal Problem:   Major depressive disorder, recurrent episode, severe (HCC) Active Problems:   Major depressive disorder, single episode   Essential hypertension   Arthritis of right hip   Status post total replacement of right hip   Intentional drug overdose ([place])   Suicidal ideation   Non-traumatic rhabdomyolysis   Time spent: 30 minutes   Barton Dubois  [facility] Pager [phone]. If 7PM-7AM, please contact night-coverage at [email or web address], password [facility] 04/06/2016, 2:50 PM  LOS: 2 days

## 2016-04-06 NOTE — Progress Notes (Addendum)
Chaplain visit is a follow up visit. [name] remains committed to reframing her life after her suicide attempt. She is open and forthcoming and deeply reflective when asked probing questions. 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 She can easily move conversations away from places she does not wish them to go. She is an Administrator and an introvert, despite her demeanor, and she needs time to reflect before moving to new places in her care.  [name] reports she has no desire to commit suicide again, that she needs assistance in learning coping tools and ways of letting unimportant hurts go. She is not looking forward to being placed in the [facility], but is looking forward to the insights of staff and new drug regiments.  [name]. [name], [place]

## 2016-04-07 DIAGNOSIS — E440 Moderate protein-calorie malnutrition: Secondary | ICD-10-CM

## 2016-04-07 LAB — CBC
HCT: 29.3 % — ABNORMAL LOW (ref 36.0–46.0)
Hemoglobin: 9.2 g/dL — ABNORMAL LOW (ref 12.0–15.0)
MCH: 28.7 pg (ref 26.0–34.0)
MCHC: 31.4 g/dL (ref 30.0–36.0)
MCV: 91.3 fL (ref 78.0–100.0)
PLATELETS: 237 10*3/uL (ref 150–400)
RBC: 3.21 MIL/uL — AB (ref 3.87–5.11)
RDW: 17.1 % — ABNORMAL HIGH (ref 11.5–15.5)
WBC: 10.4 10*3/uL (ref 4.0–10.5)

## 2016-04-07 LAB — TYPE AND SCREEN
ABO/RH(D): A POS
Antibody Screen: NEGATIVE
UNIT DIVISION: 0
UNIT DIVISION: 0

## 2016-04-07 LAB — HEMOGLOBIN A1C
HEMOGLOBIN A1C: 5.5 % (ref 4.8–5.6)
MEAN PLASMA GLUCOSE: 111 mg/dL

## 2016-04-07 NOTE — Clinical Social Work Psych Assess (Signed)
Clinical Social Work Nature conservation officer  Clinical Social Worker:  Lia Hopping, LCSW Date/Time:  04/07/2016, 11:27 AM Referred By:  Clinical Social Work Date Referred:  04/06/16 Reason for Referral:  Behavioral Health Issues   Presenting Symptoms/Problems  Presenting Symptoms/Problems(in person's/family's own words):  " Everything gradually built up. I made a double digit list of why I should not be around."   HPI: history of depression, anxiety, insomnia, and HTN brought by EMS after being found down in her home. She had taken 34 tablets of temazepam 4 days prior. She reports this was an attempt to kill herself due to many issues in her life including a poor relationship and resentment against her sister, divorce, lack of social supports, financial difficulties, and difficulty ambulating/poor health following R hip replacement in July. She remembers waking up in her bed several days after taking the medication, diffusely weak, having urinated and defecated on herself. Called her sister who called EMS when she was unable to enter the house. She denies any fall or focal pain. Has noticed a cough over the past hour or so. No fever.        Abuse/Neglect/Trauma History  Abuse/Neglect/Trauma History:  Denies History Abuse/Neglect/Trauma History Comments (indicate dates): Denies Trauma/ Neglect and abuse   Psychiatric History  Psychiatric History:  Denies History Psychiatric Medication:  Prozac   Current Mental Health Hospitalizations/Previous Mental Health History:     Current Provider:  Horatio Pel, MD Place and Date:    Current Medications:    Legend:                    Inactive   Active   Linked          Medications 04/07/16 04/08/16 04/09/16 04/10/16 04/11/16 04/12/16 04/13/16  amoxicillin-clavulanate (AUGMENTIN) 875-125 MG per tablet 1 tablet Dose: 1 tablet Freq: Every 12 hours Route: PO Start: 04/04/16 2200 End: 04/11/16 2159   0911   2200    1000   2200    1000   2200    1000   2200    1000   2159-D/C'd      cholestyramine (QUESTRAN) packet 4 g Dose: 4 g Freq: 2 times daily with meals Route: PO Start: 04/05/16 0800   0910   1700    0800   1700    0800   1700    0800   1700    0800   1700    0800   1700    0800   1700     feeding supplement (ENSURE ENLIVE) (ENSURE ENLIVE) liquid 237 mL Dose: 237 mL Freq: Every 24 hours Route: PO Start: 04/06/16 1445   1445    1445    1445    1445    1445    1445    1445     FLUoxetine (PROZAC) capsule 60 mg Dose: 60 mg Freq: Daily Route: PO Start: 04/04/16 2100   0911    1000    1000    1000    1000    9629    5284     folic acid (FOLVITE) tablet 1 mg Dose: 1 mg Freq: Daily Route: PO Start: 04/04/16 2100   0910    1000    1000    1000    1000    1000    1000     gabapentin (NEURONTIN) capsule 200 mg Dose: 200 mg Freq: Daily at bedtime Route: PO  Indications of Use: INSOMNIA,SOCIAL ANXIETY DISORDER Start: 04/05/16 2200   2200    2200    2200    2200    2200    2200    2200     iron polysaccharides (NIFEREX) capsule 150 mg Dose: 150 mg Freq: 2 times daily Route: PO Start: 04/06/16 1000   (1000)   2200    1000   2200    1000   2200    1000   2200    1000   2200    1000   2200    1000   2200     losartan (COZAAR) tablet 25 mg Dose: 25 mg Freq: Daily Route: PO Start: 04/07/16 1000   0911    1000    1000    1000    1000    1000    1000     methocarbamol (ROBAXIN) tablet 500 mg Dose: 500 mg Freq: 3 times daily Route: PO Start: 04/04/16 2200   0911   1600   2200    1000   1600   2200    1000   1600   2200    1000   1600   2200    1000   1600   2200    1000   1600   2200    1000   1600   2200     pantoprazole (PROTONIX) EC tablet 40 mg Dose: 40 mg Freq: Daily Route: PO Start: 04/04/16 2100    0911    1000    1000    1000    1000    1000    1000     saccharomyces boulardii (FLORASTOR) capsule 250 mg Dose: 250 mg Freq: 2 times daily Route: PO Start: 04/05/16 1100   0911   2200    1000   2200    1000   2200    1000   2200    1000   2200    1000   2200    1000   2200     sodium chloride flush (NS) 0.9 % injection 3 mL Dose: 3 mL Freq: Every 12 hours Route: IV Start: 04/04/16 2200   1000   2200    1000   2200    1000   2200    1000   2200    1000   2200    1000   2200    1000   2200     thiamine (VITAMIN B-1) tablet 100 mg Dose: 100 mg Freq: Daily Route: PO Start: 04/04/16 2100   0911    1000    1000    1000    1000    1000    1000     Medications 04/07/16 04/08/16 04/09/16 04/10/16 04/11/16 04/12/16 04/13/16    Continuous Meds Sorted by Name  for [name] as of 04/07/16 1149     Previous Inpatient Admission/Date/Reason:  None   Emotional Health/Current Symptoms  Suicide/Self Harm: Suicide Attempt in the Past (date/description) Suicide Attempt in Past (date/description):  October 2017  Other Harmful Behavior (ex. homicidal ideation) (describe):  Denies Harmful Behaviors.    Psychotic/Dissociative Symptoms  Psychotic/Dissociative Symptoms: None Reported Other Psychotic/Dissociative Symptoms:  Patient reported having hallucinations after overdosing.    Attention/Behavioral Symptoms  Attention/Behavioral Symptoms: Within Normal Limits Other Attention/Behavioral Symptoms:     Cognitive Impairment  Cognitive Impairment:  Orientation - Place, Orientation -  Self, Orientation - Situation, Within Normal Limits, Orientation - Time Other Cognitive Impairment: Alert and oriented.    Mood and Adjustment  Mood and Adjustment:   Calm and Pleasant   Stress, Anxiety, Trauma, Any Recent Loss/Stressor  Stress, Anxiety, Trauma, Any Recent Loss/Stressor: Relationship, Grief/Loss  (recent or history), Current Legal Problems/Pending Court Date Anxiety (frequency):    Phobia (specify):    Compulsive Behavior (specify):    Obsessive Behavior (specify):   Other Stress, Anxiety, Trauma, Any Recent Loss/Stressor: Health issues   Substance Abuse/Use  Substance Abuse/Use: None SBIRT Completed (please refer for detailed history): N/A Self-reported Substance Use (last use and frequency):  Denies  Urinary Drug Screen Completed: Yes Alcohol Level:  None   Environment/Housing/Living Arrangement  Environmental/Housing/Living Arrangement: Stable Housing Who is in the Home:  Self  Emergency Contact:    Financial  Financial: Medicare   Patient's Strengths and Goals  Patient's Strengths and Goals (patient's own words):  " Something wanted me here, I have a purpose to be here because I did not die."   Clinical Social Worker's Interpretive Summary  Clinical Social Workers Interpretive Summary:  LCSWA met with patient at bedside, pt. Agreeable to assessment. Patient reports she has a lot of stressors in her life, financial issues, loss of her rescue dog, difficult family dynamics and health problems. The patient reports problems have been gradually building up in her life and she felt overwhelmed and feels does not fit into moderate society with all the technology and stuff. She reports after her sister nailed a note to her door about her taking responsibility to get a car and pick up her own medications.  The  Patient reports she became frustrated and decided to overdose on her medications. She reports, " I thought I would die and sleep peaceful but it was the worst five days of my life, I hallucinated and laid in my own feces". Patient reports, she regrets overdosing and now feels she belongs. " Something wanted me here, I have a purpose to be here because I did not die." Patient reports she is looking forward [facility] and getting another  recuse dog.    Patient is voluntary at this time for [facility].  LCSWA will assist with patient disposition to facility   Disposition  Disposition: Inpatient Referral Made [facility], [place])

## 2016-04-07 NOTE — Care Management Note (Signed)
Case Management Note  Patient Details  Name: [name] MRN: [id number] Date of Birth: 16-Oct-1950  Subjective/Objective:    65 y/o f admitted w/Major depressive d/o. Psych-IP psych. Psych CSW following for IP Psych. Medically stable for d/c.                Action/Plan:d/c IP Psych.   Expected Discharge Date:   (unknown)               Expected Discharge Plan:  Psychiatric Hospital  In-House Referral:  Clinical Social Work  Discharge planning Services     Post Acute Care Choice:    Choice offered to:     DME Arranged:    DME Agency:     HH Arranged:    [place] Agency:     Status of Service:  Completed, signed off  If discussed at [employer], dates discussed:    Additional Comments:  Dessa Phi, RN 04/07/2016, 1:23 PM

## 2016-04-07 NOTE — Progress Notes (Addendum)
[facility] PROGRESS NOTE  [name] [id number] DOB: 13-Aug-1950 DOA: 04/04/2016 PCP: Horatio Pel, MD  Interim summary and HPI 65 y.o. female with history of depression, anxiety, insomnia, and HTN brought by EMS after being found down in her home. She had taken 34 tablets of temazepam 4 days prior to admission in attempt to kill herself. She reported having due to many issues in her life including a poor relationship and resentment against her sister, divorce, lack of social supports, financial difficulties, and difficulty ambulating/poor health following R hip replacement in July. She remembers waking up in her bed several days after taking the medication, feeling diffusely weak and having urinated and defecated on herself. Called her sister who called EMS when she was unable to enter the house. She denies any fall or focal pain. Has noticed a cough, no fever.    ED Course: On arrival she appeared dehydrated and angry about having been brought to the ED. Afebrile, Na 151, BUN 36, Cr 0.65. CK was 675. Urinalysis had many bacteria but was considerably contaminated with feces per ED report. WBC 11.1, hgb 7.7. ECG was abnormal with significant artifact, read as possibly AFlutter or AFib. CXR showed opacity at the right base.  Assessment/Plan: Intentional drug overdose/suicidal ideation/depression: Benzodiazepine over dose ~ 96hrs PTA. Temazepam half-life varies widely 3 - 19 hrs, so it is nearly out of her system, though still detected by UDS.  - Continues to have suicidal ideation, wishes actively to be dead. Positive flat affect. - Psychiatry consulted this morning  -continue Sitter, suicide precautions.  - Will follow psychiatry recommendations  - Continue prozac  Aspiration pneumonia: RLL opacity with cough after OD.  -will continue Augmentin (plan is to treat for 7 days total) - continue Tessalon prn -follow cx data and continue supportive care -patient reports severe  diarrhea with use of abx's; will add probiotics and continue use of home cholestyramine.  Bacteriuria:  -patient w/o dysuria  -receiving augmentin for PNA -will follow repeated UA and urine culture -so far no signs of infection and denying any symptoms   Rhabdomyolysis: Mild, CK 675, troponin neg. Creatinine wnl. Due to being down for prolonged time and compounded by dehydration.  -will continue IVFs, but rate adjusted for maintenance -patient is eating and drinking properly.  Abnormal ECG: No history of arrhythmia or palpitations. -no abnormalities seen on Telemetry -Repeated ECG on 04/06/16 was normal -Mg and potassium WNL as well -telemetry discontinued.  Dehydration with Hypernatremia: Due to dehydration.  -s/p 2L NS in ED -will continue IVF's (will adjust rate to maintenance only) -sodium last at 141  -will follow BMP intermittently  HTN:  -patient presented dehydrated and with soft BP  -stable now and BP rising -losartan resume at 25 mg daily; will increased to historic home regimen of 50mg  daily if needed.  Chronic back and right hip pain:  - continue sparing use of analgesic and robaxin - Kpad prn  Insomnia: Restoril prescribed by PCP for this. Has tried trazodone and remeron and melatonin in past without improvement. -given use of this agent to kill herself, will avoid benzo's -per psych rec's will use neurontin 200 mg QHS  Acute on chronic anemia: patient with low Hgb since her surgery in July. -no signs of acute bleeding  -Hgb 9.2 this morning after 2 units PRBC's transfused on 10/7 -follow Hgb trend intermittently now -will continue niferex  Protein calorie malnutrition moderate -will follow rec's from nutritional service -continue feeding supplements  Code Status: DNR  Family Communication: no family at bedside  Disposition Plan: will need inpatient psychiatry facility placement once medically stable. S/p 2 units of PRBC's, with repleted electrolytes  and afebrile. patient is now medically stable for discharge from internal medicine stand point.   Consultants:  Psychiatry   Procedures:  See below for x-ray reports   Antibiotics:  Augmentin 10/6  HPI/Subjective: Afebrile, no CP and no SOB currently. Patient still with ongoing passive suicidal thoughts. Medically stable and waiting on beds at inpatient psych facility.  Objective: Vitals:   04/07/16 0500 04/07/16 1301  BP: 126/60 (!) 138/47  Pulse: 92 (!) 102  Resp: 16 16  Temp: 98.2 F (36.8 C) 98.4 F (36.9 C)    Intake/Output Summary (Last 24 hours) at 04/07/16 1721 Last data filed at 04/07/16 1300  Gross per 24 hour  Intake          1871.66 ml  Output                0 ml  Net          1871.66 ml   Filed Weights   04/04/16 1212  Weight: 74.4 kg (164 lb)    Exam:   General:  Afebrile, AAOX3, still with passive SI; but with improvement in her affect; patient engaging more in conversation/interview; improved mood overall. No CP, no SOB. Still with intermittent dry cough, which she endorses is better.  Cardiovascular: S1 and s2, no rubs, no gallops  Respiratory: CTA bilaterally  Abdomen: soft, NT, ND, positive BS  Musculoskeletal: no edema, no cyanosis   Data Reviewed: Basic Metabolic Panel:  Recent Labs Lab 04/04/16 1312 04/05/16 0553 04/06/16 0536  NA 151* 142 141  K 3.4* 3.2* 4.2  CL 116* 114* 113*  CO2 26 22 23   GLUCOSE 127* 112* 108*  BUN 36* 17 11  CREATININE 0.65 0.53 0.56  CALCIUM 8.7* 7.9* 8.0*  MG  --  1.6* 1.8   Liver Function Tests:  Recent Labs Lab 04/04/16 1312  AST 58*  ALT 46  ALKPHOS 96  BILITOT 1.0  PROT 6.8  ALBUMIN 3.4*   CBC:  Recent Labs Lab 04/04/16 1312 04/05/16 0553 04/05/16 2022 04/06/16 0536 04/07/16 0459  WBC 11.1* 9.6  --  9.7 10.4  NEUTROABS 7.4  --   --   --   --   HGB 7.7* 6.4* 8.6* 8.3* 9.2*  HCT 24.7* 20.2* 26.7* 26.4* 29.3*  MCV 93.9 91.4  --  90.7 91.3  PLT PLATELET CLUMPS NOTED ON  SMEAR, UNABLE TO ESTIMATE 202  --  200 237   Cardiac Enzymes:  Recent Labs Lab 04/04/16 1312 04/05/16 0553  CKTOTAL 675* 362*   Studies: No results found.  Scheduled Meds: . amoxicillin-clavulanate  1 tablet Oral Q12H  . cholestyramine  4 g Oral BID WC  . feeding supplement (ENSURE ENLIVE)  237 mL Oral Q24H  . FLUoxetine  60 mg Oral Daily  . folic acid  1 mg Oral Daily  . gabapentin  200 mg Oral QHS  . iron polysaccharides  150 mg Oral BID  . losartan  25 mg Oral Daily  . methocarbamol  500 mg Oral TID  . pantoprazole  40 mg Oral Daily  . saccharomyces boulardii  250 mg Oral BID  . sodium chloride flush  3 mL Intravenous Q12H  . thiamine  100 mg Oral Daily   Continuous Infusions: . sodium chloride 50 mL/hr at 04/07/16 [id number]    Principal Problem:   Major  depressive disorder, recurrent episode, severe (HCC) Active Problems:   Major depressive disorder, single episode   Essential hypertension   Arthritis of right hip   Status post total replacement of right hip   Intentional drug overdose ([place])   Suicidal ideation   Non-traumatic rhabdomyolysis   Time spent: 30 minutes   Barton Dubois  [facility] Pager [phone]. If 7PM-7AM, please contact night-coverage at [email or web address], password [facility] 04/07/2016, 5:21 PM  LOS: 3 days

## 2016-04-07 NOTE — Progress Notes (Addendum)
Faxed information to Inpatient psychiatric facility. Will inform treatment team when bed is available. [facility] is viewing patient clinical information, will follow up in A.M.

## 2016-04-08 DIAGNOSIS — T50901A Poisoning by unspecified drugs, medicaments and biological substances, accidental (unintentional), initial encounter: Secondary | ICD-10-CM

## 2016-04-08 DIAGNOSIS — T50902D Poisoning by unspecified drugs, medicaments and biological substances, intentional self-harm, subsequent encounter: Secondary | ICD-10-CM | POA: Diagnosis not present

## 2016-04-08 DIAGNOSIS — E440 Moderate protein-calorie malnutrition: Secondary | ICD-10-CM

## 2016-04-08 DIAGNOSIS — Z91041 Radiographic dye allergy status: Secondary | ICD-10-CM | POA: Diagnosis not present

## 2016-04-08 DIAGNOSIS — K219 Gastro-esophageal reflux disease without esophagitis: Secondary | ICD-10-CM | POA: Diagnosis not present

## 2016-04-08 DIAGNOSIS — Z96641 Presence of right artificial hip joint: Secondary | ICD-10-CM | POA: Diagnosis present

## 2016-04-08 DIAGNOSIS — Z96649 Presence of unspecified artificial hip joint: Secondary | ICD-10-CM

## 2016-04-08 DIAGNOSIS — R748 Abnormal levels of other serum enzymes: Secondary | ICD-10-CM

## 2016-04-08 DIAGNOSIS — T424X2A Poisoning by benzodiazepines, intentional self-harm, initial encounter: Secondary | ICD-10-CM | POA: Diagnosis present

## 2016-04-08 DIAGNOSIS — I10 Essential (primary) hypertension: Secondary | ICD-10-CM | POA: Diagnosis not present

## 2016-04-08 DIAGNOSIS — K529 Noninfective gastroenteritis and colitis, unspecified: Secondary | ICD-10-CM | POA: Diagnosis not present

## 2016-04-08 DIAGNOSIS — Z79899 Other long term (current) drug therapy: Secondary | ICD-10-CM | POA: Diagnosis not present

## 2016-04-08 DIAGNOSIS — D649 Anemia, unspecified: Secondary | ICD-10-CM | POA: Diagnosis not present

## 2016-04-08 DIAGNOSIS — J690 Pneumonitis due to inhalation of food and vomit: Secondary | ICD-10-CM

## 2016-04-08 DIAGNOSIS — F332 Major depressive disorder, recurrent severe without psychotic features: Secondary | ICD-10-CM | POA: Diagnosis not present

## 2016-04-08 DIAGNOSIS — M7989 Other specified soft tissue disorders: Secondary | ICD-10-CM | POA: Diagnosis not present

## 2016-04-08 DIAGNOSIS — M25571 Pain in right ankle and joints of right foot: Secondary | ICD-10-CM | POA: Diagnosis present

## 2016-04-08 DIAGNOSIS — K449 Diaphragmatic hernia without obstruction or gangrene: Secondary | ICD-10-CM | POA: Diagnosis not present

## 2016-04-08 DIAGNOSIS — J189 Pneumonia, unspecified organism: Secondary | ICD-10-CM | POA: Diagnosis not present

## 2016-04-08 DIAGNOSIS — Z5189 Encounter for other specified aftercare: Secondary | ICD-10-CM

## 2016-04-08 DIAGNOSIS — Z888 Allergy status to other drugs, medicaments and biological substances status: Secondary | ICD-10-CM | POA: Diagnosis not present

## 2016-04-08 DIAGNOSIS — Z9109 Other allergy status, other than to drugs and biological substances: Secondary | ICD-10-CM | POA: Diagnosis not present

## 2016-04-08 DIAGNOSIS — Z853 Personal history of malignant neoplasm of breast: Secondary | ICD-10-CM | POA: Diagnosis not present

## 2016-04-08 DIAGNOSIS — J181 Lobar pneumonia, unspecified organism: Secondary | ICD-10-CM | POA: Diagnosis not present

## 2016-04-08 DIAGNOSIS — Z8619 Personal history of other infectious and parasitic diseases: Secondary | ICD-10-CM | POA: Diagnosis not present

## 2016-04-08 LAB — CK: CK TOTAL: 87 U/L (ref 38–234)

## 2016-04-08 LAB — COMPREHENSIVE METABOLIC PANEL
ALT: 32 U/L (ref 14–54)
ANION GAP: 10 (ref 5–15)
AST: 45 U/L — AB (ref 15–41)
Albumin: 2.7 g/dL — ABNORMAL LOW (ref 3.5–5.0)
Alkaline Phosphatase: 65 U/L (ref 38–126)
BILIRUBIN TOTAL: 0.5 mg/dL (ref 0.3–1.2)
BUN: 9 mg/dL (ref 6–20)
CO2: 24 mmol/L (ref 22–32)
Calcium: 8.2 mg/dL — ABNORMAL LOW (ref 8.9–10.3)
Chloride: 106 mmol/L (ref 101–111)
Creatinine, Ser: 0.66 mg/dL (ref 0.44–1.00)
Glucose, Bld: 112 mg/dL — ABNORMAL HIGH (ref 65–99)
POTASSIUM: 4.3 mmol/L (ref 3.5–5.1)
Sodium: 140 mmol/L (ref 135–145)
TOTAL PROTEIN: 5.9 g/dL — AB (ref 6.5–8.1)

## 2016-04-08 MED ORDER — FOLIC ACID 1 MG PO TABS: 1.0000 mg | ORAL_TABLET | Freq: Every day | ORAL | Status: DC

## 2016-04-08 MED ORDER — SACCHAROMYCES BOULARDII 250 MG PO CAPS: 250.0000 mg | ORAL_CAPSULE | Freq: Two times a day (BID) | ORAL | Status: DC

## 2016-04-08 MED ORDER — BENZONATATE 200 MG PO CAPS: 200.0000 mg | ORAL_CAPSULE | Freq: Three times a day (TID) | ORAL | Status: DC | PRN

## 2016-04-08 MED ORDER — OXYCODONE HCL 5 MG PO TABS: 5.0000 mg | ORAL_TABLET | Freq: Two times a day (BID) | ORAL | 0 refills | Status: DC | PRN

## 2016-04-08 MED ORDER — POLYSACCHARIDE IRON COMPLEX 150 MG PO CAPS
150.0000 mg | ORAL_CAPSULE | Freq: Two times a day (BID) | ORAL | Status: DC
Start: 2016-04-08 — End: 2016-09-24

## 2016-04-08 MED ORDER — AMOXICILLIN-POT CLAVULANATE 875-125 MG PO TABS: 1.0000 | ORAL_TABLET | Freq: Two times a day (BID) | ORAL | Status: AC

## 2016-04-08 MED ORDER — ENSURE ENLIVE PO LIQD: 237.0000 mL | ORAL | Status: DC

## 2016-04-08 MED ORDER — GABAPENTIN 100 MG PO CAPS: 200.0000 mg | ORAL_CAPSULE | Freq: Every day | ORAL | Status: DC

## 2016-04-08 MED ORDER — THIAMINE HCL 100 MG PO TABS: 100.0000 mg | ORAL_TABLET | Freq: Every day | ORAL | Status: DC

## 2016-04-08 MED ORDER — AMOXICILLIN-POT CLAVULANATE 875-125 MG PO TABS: 1.0000 | ORAL_TABLET | Freq: Two times a day (BID) | ORAL | Status: DC

## 2016-04-08 NOTE — Progress Notes (Signed)
LCSW continuing to assist with patient disposition to inpatient psychiatry facility. Refaxed patient clinicals to [facility] facilities Will update clinical staff when bed is available.

## 2016-04-08 NOTE — Progress Notes (Signed)
Patient escorted out by nurse tech and [place] transport. No concerns at this time.

## 2016-04-08 NOTE — Discharge Summary (Signed)
Physician Discharge Summary  [name] [id number] DOB: Feb 22, 1951 DOA: 04/04/2016  PCP: Horatio Pel, MD  Admit date: 04/04/2016 Discharge date: 04/08/2016  Time spent: 35 minutes  Recommendations for Outpatient Follow-up:  1. Repeat BMET in 1 week to follow electrolytes and renal function  2. Repeat CBC in 1 week to follow Hgb trend   Discharge Diagnoses:  Principal Problem:   Major depressive disorder, recurrent episode, severe ([place]) Active Problems:   Major depressive disorder, single episode   Essential hypertension   Arthritis of right hip   Status post total replacement of right hip   Intentional drug overdose ([place])   Suicidal ideation   Non-traumatic rhabdomyolysis   Elevated CK   Discharge Condition: medically stable and with plans to be discharge to inpatient psychiatry facility for further mood stabilization, psychotherapy and adjustment on her antidepressant regimen.  Diet recommendation: heart healthy diet   Filed Weights   04/04/16 1212  Weight: 74.4 kg (164 lb)    History of present illness:  65 y.o.femalewith history of depression, anxiety, insomnia, and HTN brought by EMS after being found down in her home. She had taken 34 tablets of temazepam 4 days prior to admission in attempt to kill herself. She reported having due to many issues in her life including a poor relationship and resentment against her sister, divorce, lack of social supports, financial difficulties, and difficulty ambulating/poor health following R hip replacement in July. She remembers waking up in her bed several days after taking the medication, feeling diffusely weak and having urinated and defecated on herself. Called her sister who called EMS when she was unable to enter the house. She denies any fall or focal pain. Has noticed a cough, no fever.    ED Course:On arrival she appeared dehydrated and angry about having been brought to the ED. Afebrile, Na 151, BUN 36, Cr  0.65. CK was 675. Urinalysis had many bacteria but was considerably contaminated with feces per ED report. WBC 11.1, hgb 7.7. ECG was abnormal with significant artifact, read as possibly AFlutter or AFib. CXR showed opacity at the right base.  Hospital Course:  Intentional drug overdose/suicidal ideation/depression: Benzodiazepine over dose ~ 96hrs PTA. Temazepam half-life varies widely 3 - 19 hrs, so it is nearly out of her system, though still detected by UDS.  - Continues to have suicidal ideation, wishes actively to be dead. Positive flat affect. - Psychiatry recommended inpatient treatment. -continue Sitter, suicide precautions.  -further adjustment on her antidepressant regimen and psychotherapy as per inpatient psychiatry service  - Continue prozac  Aspiration pneumonia: RLL opacity with cough after OD.  -will continue Augmentin (plan is to treat for 7 days total) -continue Tessalon prn -follow cx data and continue supportive care -patient reports severe diarrhea with use of abx's; will discharge on probiotics and continue use of home dose cholestyramine.  Bacteriuria:  -patient w/o dysuria  -receiving augmentin for PNA -will follow repeated UA and urine culture -so far no signs of infection and denying any symptoms  Rhabdomyolysis:Mild, CK 675, troponin neg. Creatinine wnl. Due to being down for prolonged time and compounded by dehydration.  -resolved with IVF's -at discharge CK level WNL -patient is eating and drinking properly.  Abnormal ECG: No history of arrhythmia or palpitations. -no abnormalities seen on Telemetry -Repeated ECG on 04/06/16 was normal -Mg and potassium WNL as well  Dehydration with Hypernatremia: Due to dehydration.  -s/p 2L NS in ED and subsequent saline infusion -patient is now eating and drinking  properly; no need for further IVF's -sodium last at 140  -will follow BMP intermittently  HTN:  -patient presented dehydrated and with soft BP   -stable now and BP rising -losartan resume -patient advise to follow heart healthy diet   Chronic back and right hip pain:  - continue sparing use of analgesic and robaxin - Kpad prn  Insomnia:Restoril prescribed by PCP for this. Has tried trazodone and remeron and melatonin in past without improvement. -given use of this agent to kill herself, will avoid benzo's -per psych rec's will use neurontin 200 mg QHS  Acute on chronic anemia: patient with low Hgb since her surgery in July. -no signs of acute bleeding  -Hgb 9.2 this morning after 2 units PRBC's transfused on 10/7 -recommend CBC to follow Hgb trend intermittently  -will continue niferex  Protein calorie malnutrition moderate -will follow rec's from nutritional service -continue feeding supplements -ensure daily and instructions to increase protein intake   Procedures:  See below for x-ray reports   Consultations:  Psychiatry   Discharge Exam: Vitals:   04/08/16 0558 04/08/16 1400  BP: (!) 158/53 (!) 143/50  Pulse: 93 96  Resp: 18 16  Temp: 99.4 F (37.4 C) 98.6 F (37 C)     General:  Afebrile, AAOX3, still with passive SI; but with improvement in her affect; patient engaging more in conversation/interview; improved mood overall. No CP, no SOB. Still with intermittent dry cough, which she endorses is better.  Cardiovascular: S1 and s2, no rubs, no gallops  Respiratory: CTA bilaterally  Abdomen: soft, NT, ND, positive BS  Musculoskeletal: no edema, no cyanosis   Discharge Instructions   Discharge Instructions    Diet - low sodium heart healthy    Complete by:  As directed    Discharge instructions    Complete by:  As directed    Follow heart healthy diet Take medications as prescribed Keep yourself well hydrated Psychotherapy and further adjustments to her antidepressant regimen as per facility protocol     Current Discharge Medication List    START taking these medications    Details  amoxicillin-clavulanate (AUGMENTIN) 875-125 MG tablet Take 1 tablet by mouth every 12 (twelve) hours.    benzonatate (TESSALON) 200 MG capsule Take 1 capsule (200 mg total) by mouth 3 (three) times daily as needed for cough.    folic acid (FOLVITE) 1 MG tablet Take 1 tablet (1 mg total) by mouth daily.    gabapentin (NEURONTIN) 100 MG capsule Take 2 capsules (200 mg total) by mouth at bedtime.    iron polysaccharides (NIFEREX) 150 MG capsule Take 1 capsule (150 mg total) by mouth 2 (two) times daily.    oxyCODONE (OXY IR/ROXICODONE) 5 MG immediate release tablet Take 1 tablet (5 mg total) by mouth every 12 (twelve) hours as needed for severe pain. Qty: 15 tablet, Refills: 0    saccharomyces boulardii (FLORASTOR) 250 MG capsule Take 1 capsule (250 mg total) by mouth 2 (two) times daily.    thiamine 100 MG tablet Take 1 tablet (100 mg total) by mouth daily.      CONTINUE these medications which have CHANGED   Details  feeding supplement, ENSURE ENLIVE, (ENSURE ENLIVE) LIQD Take 237 mLs by mouth daily.      CONTINUE these medications which have NOT CHANGED   Details  cholestyramine (QUESTRAN) 4 GM/DOSE powder Take 1 packet (4 g total) by mouth 2 (two) times daily with a meal. Qty: 378 g, Refills: 11  FLUoxetine (PROZAC) 20 MG capsule Take 1 capsule (20 mg total) by mouth 3 (three) times daily. Qty: 270 capsule, Refills: 3    losartan (COZAAR) 50 MG tablet Take 50 mg by mouth daily.    methocarbamol (ROBAXIN) 500 MG tablet Take 500 mg by mouth 3 (three) times daily.    omeprazole (PRILOSEC) 20 MG capsule Take 2 capsules (40 mg total) by mouth daily. Qty: 180 capsule, Refills: 3      STOP taking these medications     buPROPion (WELLBUTRIN XL) 300 MG 24 hr tablet      aspirin EC 325 MG EC tablet      cyclobenzaprine (FLEXERIL) 10 MG tablet      glucose blood (ONE TOUCH ULTRA TEST) test strip      oxyCODONE-acetaminophen (ROXICET) 5-325 MG tablet         Allergies  Allergen Reactions  . Ace Inhibitors Cough  . Contrast Media [Iodinated Diagnostic Agents]     Hallucinations  . Iohexol      Desc: Pt has been pre medicated with prednisone before scans, pt is unsure of type of contrast she reacted to.   . Tape Rash    "Tears my skin all up"   Follow-up Information    Horatio Pel, MD. Schedule an appointment as soon as possible for a visit in 2 week(s).   Specialty:  Internal Medicine Contact information: [address] [place] [address] [phone]           The results of significant diagnostics from this hospitalization (including imaging, microbiology, ancillary and laboratory) are listed below for reference.    Significant Diagnostic Studies: Dg Chest 2 View  Result Date: 04/04/2016 CLINICAL DATA:  Shortness of breath today, suicide attempt 4 days ago, attempted overdose, history breast cancer, hypertension, former smoker, type II diabetes mellitus EXAM: CHEST  2 VIEW COMPARISON:  06/06/2012 FINDINGS: Normal heart size and pulmonary vascularity. Atherosclerotic calcification aorta. Large hiatal hernia. Hazy opacity at RIGHT lung base question infiltrate. Minimal central peribronchial thickening. Remaining lungs clear. No pleural effusion or pneumothorax. Bones unremarkable. IMPRESSION: Large hiatal hernia. Minimal bronchitic changes with RIGHT basilar opacity suspicious for infiltrate, question pneumonia versus aspiration. Electronically Signed   By: Lavonia Dana M.D.   On: 04/04/2016 17:15    Microbiology: Recent Results (from the past 240 hour(s))  Urine culture     Status: Abnormal   Collection Time: 04/04/16  2:15 PM  Result Value Ref Range Status   Specimen Description URINE, CLEAN CATCH  Final   Special Requests NONE  Final   Culture MULTIPLE SPECIES PRESENT, SUGGEST RECOLLECTION (A)  Final   Report Status 04/06/2016 FINAL  Final     Labs: Basic Metabolic Panel:  Recent Labs Lab  04/04/16 1312 04/05/16 0553 04/06/16 0536 04/08/16 1153  NA 151* 142 141 140  K 3.4* 3.2* 4.2 4.3  CL 116* 114* 113* 106  CO2 26 22 23 24   GLUCOSE 127* 112* 108* 112*  BUN 36* 17 11 9   CREATININE 0.65 0.53 0.56 0.66  CALCIUM 8.7* 7.9* 8.0* 8.2*  MG  --  1.6* 1.8  --    Liver Function Tests:  Recent Labs Lab 04/04/16 1312 04/08/16 1153  AST 58* 45*  ALT 46 32  ALKPHOS 96 65  BILITOT 1.0 0.5  PROT 6.8 5.9*  ALBUMIN 3.4* 2.7*   CBC:  Recent Labs Lab 04/04/16 1312 04/05/16 0553 04/05/16 2022 04/06/16 0536 04/07/16 0459  WBC 11.1* 9.6  --  9.7 10.4  NEUTROABS 7.4  --   --   --   --   HGB 7.7* 6.4* 8.6* 8.3* 9.2*  HCT 24.7* 20.2* 26.7* 26.4* 29.3*  MCV 93.9 91.4  --  90.7 91.3  PLT PLATELET CLUMPS NOTED ON SMEAR, UNABLE TO ESTIMATE 202  --  200 237   Cardiac Enzymes:  Recent Labs Lab 04/04/16 1312 04/05/16 0553 04/08/16 1153  CKTOTAL 675* 362* 87    Signed:  Barton Dubois MD.  [facility] 04/08/2016, 2:10 PM

## 2016-04-08 NOTE — Progress Notes (Signed)
Patient to be transported to [facility] at [place] doctor is Dr. Ronnald Ramp and number for report is [phone].   Room #: [place], [place] Worker [phone]

## 2016-04-08 NOTE — Progress Notes (Signed)
Patient signed voluntary consent form for [facility].   Kingsley Spittle, [place] Clinical Social Worker [phone]

## 2016-04-08 NOTE — Progress Notes (Signed)
LCSWA faxed [place] All lab results provided by RN and physician- physician notes, EKG and other clinical documents.  Patient is voluntary at this time, facility will fax over voluntary consent form.  ED CSW Junie Panning will continue to assist with patient disposition and transport.

## 2016-04-08 NOTE — Care Management Important Message (Signed)
Important Message  Patient Details  Name: [name] MRN: [id number] Date of Birth: 01/15/1951   Medicare Important Message Given:  Yes    Camillo Flaming 04/08/2016, 10:07 AMImportant Message  Patient Details  Name: [name] MRN: [id number] Date of Birth: June 17, 1951   Medicare Important Message Given:  Yes    Camillo Flaming 04/08/2016, 10:06 AM

## 2016-04-16 ENCOUNTER — Encounter (HOSPITAL_COMMUNITY): Payer: Self-pay | Admitting: Psychiatry

## 2016-04-16 ENCOUNTER — Other Ambulatory Visit (HOSPITAL_COMMUNITY): Payer: Medicare Other | Attending: Psychiatry | Admitting: Psychiatry

## 2016-04-16 DIAGNOSIS — Z888 Allergy status to other drugs, medicaments and biological substances status: Secondary | ICD-10-CM | POA: Diagnosis not present

## 2016-04-16 DIAGNOSIS — Z853 Personal history of malignant neoplasm of breast: Secondary | ICD-10-CM | POA: Insufficient documentation

## 2016-04-16 DIAGNOSIS — Z8249 Family history of ischemic heart disease and other diseases of the circulatory system: Secondary | ICD-10-CM | POA: Insufficient documentation

## 2016-04-16 DIAGNOSIS — M1990 Unspecified osteoarthritis, unspecified site: Secondary | ICD-10-CM | POA: Insufficient documentation

## 2016-04-16 DIAGNOSIS — Z79899 Other long term (current) drug therapy: Secondary | ICD-10-CM | POA: Diagnosis not present

## 2016-04-16 DIAGNOSIS — Z91041 Radiographic dye allergy status: Secondary | ICD-10-CM | POA: Insufficient documentation

## 2016-04-16 DIAGNOSIS — M797 Fibromyalgia: Secondary | ICD-10-CM | POA: Diagnosis not present

## 2016-04-16 DIAGNOSIS — R7303 Prediabetes: Secondary | ICD-10-CM | POA: Insufficient documentation

## 2016-04-16 DIAGNOSIS — Z803 Family history of malignant neoplasm of breast: Secondary | ICD-10-CM | POA: Insufficient documentation

## 2016-04-16 DIAGNOSIS — Z87891 Personal history of nicotine dependence: Secondary | ICD-10-CM | POA: Diagnosis not present

## 2016-04-16 DIAGNOSIS — I10 Essential (primary) hypertension: Secondary | ICD-10-CM | POA: Insufficient documentation

## 2016-04-16 DIAGNOSIS — F419 Anxiety disorder, unspecified: Secondary | ICD-10-CM | POA: Insufficient documentation

## 2016-04-16 DIAGNOSIS — F332 Major depressive disorder, recurrent severe without psychotic features: Secondary | ICD-10-CM | POA: Insufficient documentation

## 2016-04-16 DIAGNOSIS — K219 Gastro-esophageal reflux disease without esophagitis: Secondary | ICD-10-CM | POA: Diagnosis not present

## 2016-04-16 NOTE — Progress Notes (Signed)
Psychiatric Initial Adult Assessment   Patient Identification: [name] MRN:  [id number] Date of Evaluation:  04/16/2016 Referral Source: follow up after discharge from [facility] psychiatry hospital Chief Complaint: chronic depression  Visit Diagnosis: severe major depression, recurrent without psychotic features  History of Present Illness:  [name] says she has been depressed all her life.  In the last 3 years or so things have built up to the point of so many things on her list of being tired of living that she finally gave up and took an overdose of temazepam that should have killed her.  The stay in the hospital was not that positive but she is no longer actively suicidal having made plans that if she is to live she is going to try to be as happy as she can.  Her biggest stressor is her only full sibling her sister whom she just cannot get along with.  All her life it seemed her sister and mother had a relationship that she was exclude from and to this day her sister seems condescending and controlling of her.  Her father committed suicide when she was a small child.  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  She had reached the point of no happiness, no pleasure, isolation, hopelessness, increased irritability, no energy or interest and active suicidal thoughts and attempt.  This was her only inpatient admission.   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 No history of abuse or drug use.  Associated Signs/Symptoms: Depression Symptoms:  depressed mood, anhedonia, insomnia, fatigue, feelings of worthlessness/guilt, difficulty concentrating, hopelessness, suicidal attempt, (Hypo) Manic Symptoms:   Irritable Mood, Anxiety Symptoms:  none reported Psychotic Symptoms:  none PTSD Symptoms: none  Past Psychiatric History: ongoing medication for depression, diagnosed as bipolar disorder in the hospital but denies any discrete manic episodes and doubts she is bipolar  Previous Psychotropic Medications: Yes   Substance Abuse History in the last 12 months:  No.  Consequences of Substance Abuse: Negative  Past Medical History:  Past Medical History:  Diagnosis Date  . Anemia    2016 on iron for a while   . Anxiety   . ARTHRITIS 10/25/2007  . Arthritis   . Blood transfusion without reported diagnosis   . BREAST CANCER, HX OF 08/03/2006  . Cancer ([place])    hx of breast ca x 2 on right   . DEPRESSION 08/03/2006  . Diabetes ([place])    prediabetic   . DIABETES MELLITUS, TYPE II 08/03/2006  . DYSPNEA ON EXERTION 11/21/2009   shortness of breath with exertion   . EPISTAXIS, RECURRENT 01/27/2008  . FIBROMYALGIA 01/13/2008  . GERD 04/30/2008  . HEPATITIS C 08/03/2006  . HIP PAIN, RIGHT 04/30/2007  . History of detached retina repair 02/12/2011  . HYPERTENSION 04/30/2008  . INSOMNIA-SLEEP DISORDER-UNSPEC 11/21/2009  . KNEE SPRAIN, ACUTE 01/27/2008  . LEG PAIN, LEFT 01/13/2008  . OSTEOPENIA 07/26/2007  . Persistent vomiting 08/17/2008  . Pneumonia    hx of   . Prediabetes     Past Surgical History:  Procedure Laterality Date  . ABDOMINAL HYSTERECTOMY    . BREAST LUMPECTOMY  2000   right with axillary LND  .  CHOLECYSTECTOMY  06/05/2012   Procedure: LAPAROSCOPIC CHOLECYSTECTOMY;  Surgeon: Adin Hector, MD;  Location: [facility] OR[facility];  Service: General;  Laterality: N/A;  . LAPAROSCOPIC LYSIS OF ADHESIONS  06/05/2012   Procedure: LAPAROSCOPIC LYSIS OF ADHESIONS;  Surgeon: Adin Hector, MD;  Location: [facility];  Service: General;  Laterality: N/A;  . MASTECTOMY  2007   Dr. Margot Chimes  . s/p right broken leg with surgury as teen    . s/p thyroid FNA neg.    . TONSILLECTOMY    . TOTAL HIP ARTHROPLASTY Right  01/25/2016   Procedure: RIGHT TOTAL HIP ARTHROPLASTY ANTERIOR APPROACH;  Surgeon: Mcarthur Rossetti, MD;  Location: [facility];  Service: Orthopedics;  Laterality: Right;  . TUBAL LIGATION      Family Psychiatric History: half brother committed suicide as did their common father  Family History:  Family History  Problem Relation Age of Onset  . Heart disease Mother   . Cancer Sister     breast    Social History:   Social History   Social History  . Marital status: Divorced    Spouse name: N/A  . Number of children: N/A  . Years of education: N/A   Occupational History  . Medical Editor    Social History Main Topics  . Smoking status: Former Smoker    Packs/day: 4.00    Years: 40.00    Types: Cigarettes    Quit date: 06/30/2005  . Smokeless tobacco: Never Used     Comment: Pt smoked up to 4 ppd  . Alcohol use 0.0 oz/week     Comment: rare  . Drug use:     Types: Benzodiazepines  . Sexual activity: Not Asked   Other Topics Concern  . None   Social History Narrative  . None    Additional Social History: none  Allergies:   Allergies  Allergen Reactions  . Ace Inhibitors Cough  . Contrast Media [Iodinated Diagnostic Agents]     Hallucinations  . Iohexol      Desc: Pt has been pre medicated with prednisone before scans, pt is unsure of type of contrast she reacted to.   . Tape Rash    "Tears my skin all up"    Metabolic Disorder Labs: Lab Results  Component Value Date   HGBA1C 5.5 04/06/2016   MPG 111 04/06/2016   MPG 128 (H) 06/04/2012   No results found for: PROLACTIN Lab Results  Component Value Date   CHOL 175 08/10/2013   TRIG 250.0 (H) 08/10/2013   HDL 62.80 08/10/2013   CHOLHDL 3 08/10/2013   VLDL 50.0 (H) 08/10/2013   LDLCALC 58 02/10/2011   LDLCALC 66 11/21/2009     Current Medications: Current Outpatient Prescriptions  Medication Sig Dispense Refill  . benzonatate (TESSALON) 200 MG capsule Take 1 capsule (200 mg total) by mouth 3  (three) times daily as needed for cough.    . cholestyramine (QUESTRAN) 4 GM/DOSE powder Take 1 packet (4 g total) by mouth 2 (two) times daily with a meal. 378 g 11  . feeding supplement, ENSURE ENLIVE, (ENSURE ENLIVE) LIQD Take 237 mLs by mouth daily.    Marland Kitchen FLUoxetine (PROZAC) 20 MG capsule Take 1 capsule (20 mg total) by mouth 3 (three) times daily. (Patient taking differently: Take 60 mg by mouth daily. ) [id number] capsule 3  . folic acid (FOLVITE) 1 MG tablet Take 1 tablet (1 mg total) by mouth daily.    Marland Kitchen gabapentin (NEURONTIN) 100 MG capsule  Take 2 capsules (200 mg total) by mouth at bedtime.    . iron polysaccharides (NIFEREX) 150 MG capsule Take 1 capsule (150 mg total) by mouth 2 (two) times daily.    Marland Kitchen losartan (COZAAR) 50 MG tablet Take 50 mg by mouth daily.    . methocarbamol (ROBAXIN) 500 MG tablet Take 500 mg by mouth 3 (three) times daily.    Marland Kitchen omeprazole (PRILOSEC) 20 MG capsule Take 2 capsules (40 mg total) by mouth daily. (Patient taking differently: Take 20 mg by mouth 2 (two) times daily. ) 180 capsule 3  . oxyCODONE (OXY IR/ROXICODONE) 5 MG immediate release tablet Take 1 tablet (5 mg total) by mouth every 12 (twelve) hours as needed for severe pain. 15 tablet 0  . saccharomyces boulardii (FLORASTOR) 250 MG capsule Take 1 capsule (250 mg total) by mouth 2 (two) times daily.    Marland Kitchen thiamine 100 MG tablet Take 1 tablet (100 mg total) by mouth daily.     No current facility-administered medications for this visit.     Neurologic: Headache: Negative Seizure: Negative Paresthesias:Negative  Musculoskeletal: Strength & Muscle Tone: walks with a cane Gait & Station: broad based Patient leans: N/A  Psychiatric Specialty Exam: ROS  There were no vitals taken for this visit.There is no height or weight on file to calculate BMI.  General Appearance: Well Groomed  Eye Contact:  Good  Speech:  Clear and Coherent  Volume:  Normal  Mood:  Depressed  Affect:  Congruent  Thought  Process:  Coherent and Goal Directed  Orientation:  Full (Time, Place, and Person)  Thought Content:  Logical  Suicidal Thoughts:  No  Homicidal Thoughts:  No  Memory:  Immediate;   Good Recent;   Good Remote;   Good  Judgement:  Good  Insight:  Good  Psychomotor Activity:  Normal  Concentration:  Concentration: Good and Attention Span: Good  Recall:  Good  Fund of Knowledge:Good  Language: Good  Akathisia:  Negative  Handed:  Right  AIMS (if indicated):  0  Assets:  Communication Skills Desire for Improvement Financial Resources/Insurance Housing Leisure Time Resilience Talents/Skills Transportation Vocational/Educational  ADL's:  Intact  Cognition: WNL  Sleep:  poor    Treatment Plan Summary: Admit to IOP with daily group therapy.  Continue current meds for now.  Doubt diagnosis of bipolar and will use major depression diagnosis for now.  She wants to give the risperidone a chance but says she believes she might need an antidepressant.   Donnelly Angelica, MD 10/18/201712:20 PM

## 2016-04-16 NOTE — Progress Notes (Signed)
Comprehensive Clinical Assessment (CCA) Note  04/16/2016 WILLIS DVORKIN [id number]  Visit Diagnosis:   No diagnosis found.    CCA Part One  Part One has been completed on paper by the patient.  (See scanned document in Chart Review)  CCA Part Two A  Intake/Chief Complaint:  CCA Intake With Chief Complaint CCA Part Two Date: 04/16/16 CCA Part Two Time: [id number] Chief Complaint/Presenting Problem: This is a 65 yr old, employed, divorced, Caucasian female who was transitioned from [facility].  Pt was admitted post OD # 34 Temazepam in a suicide attempt.  Pt was alone and found five days later.  Pt reports a long hx of depression since childhood.  Pt currently denies SI.  States apparently someone "wants her to stay on this earth."  Discussed safety options with pt.  Pt is able contract for safety.  Triggers/Stressors:  Unresolved grief/loss issues:  On 01-25-16, pt had rt hip replacement.  States the surgery went well, but remains in physical therapy.  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  2)  Financial Strain:  States she's in a lot of debt.  Owes IRS $5,000 since 2005.  3)  Legal Issues:  Received a letter from [place].  "I got a criminal warrant d/t running into someone's car."  Pt states she doesn't remember doing this.  4)  Conflictual relationship with older sister.  Pt states she has always felt that she is the black sheep of the family.  Reports that sister is overbearing and controlling.  "I am tired of doing her song and dance.  I have demoted her to a cousin."  72)  No support system.  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  Pt reports no prior psychiatrist hospitalizations, besides recent one at [facility].  No prior suicide  attempts.  Reports seeing a therapist fifteen yrs ago in [place], [place].  Family Hx:  Father and brother committed suicide.                                              Patients Currently Reported Symptoms/Problems: Sadness, low self-esteem, indecisiveness, irritability, anhedonia, loss of motivation, poor sleep, ruminating thoughts Individual's Strengths: Pt is motivated for treatment. Individual's Preferences: Speak loudly...d/t hard of hearing Individual's Abilities: Pt is very direct. Type of Services Patient Feels Are Needed: MH-IOP Initial Clinical Notes/Concerns: Pt may be perceived as being very aggressive and hostile.  Mental Health Symptoms Depression:  Depression: Change in energy/activity, Fatigue, Difficulty Concentrating, Sleep (too much or little), Irritability  Mania:  Mania: N/A  Anxiety:   Anxiety: Tension, Irritability  Psychosis:  Psychosis: N/A  Trauma:  Trauma: N/A  Obsessions:  Obsessions: N/A  Compulsions:     Inattention:     Hyperactivity/Impulsivity:     Oppositional/Defiant Behaviors:  Oppositional/Defiant Behaviors: N/A  Borderline Personality:     Other Mood/Personality Symptoms:      Mental Status Exam Appearance and self-care  Stature:  Stature: Average  Weight:  Weight: Average weight  Clothing:  Clothing: Casual  Grooming:  Grooming: Normal  Cosmetic use:  Cosmetic Use: None  Posture/gait:  Posture/Gait: Normal  Motor activity:  Motor Activity: Slowed (uses a cane)  Sensorium  Attention:  Attention: Normal  Concentration:  Concentration: Normal  Orientation:  Orientation: X5  Recall/memory:  Recall/Memory: Normal  Affect and Mood  Affect:  Affect: Blunted  Mood:  Mood: Depressed  Relating  Eye contact:  Eye Contact: Normal  Facial expression:  Facial Expression: Sad  Attitude toward examiner:  Attitude Toward Examiner: Cooperative  Thought and Language  Speech flow: Speech Flow: Normal  Thought content:  Thought Content: Appropriate to mood  and circumstances  Preoccupation:     Hallucinations:     Organization:     Transport planner of Knowledge:  Fund of Knowledge: Average  Intelligence:  Intelligence: Above Average  Abstraction:  Abstraction: Psychologist, sport and exercise:  Judgement: Poor  Reality Testing:  Reality Testing: Distorted  Insight:  Insight: Gaps  Decision Making:  Decision Making: Impulsive  Social Functioning  Social Maturity:  Social Maturity: Isolates  Social Judgement:  Social Judgement: Normal  Stress  Stressors:  Stressors: Family conflict, Grief/losses, Illness, Chiropodist, Work  Coping Ability:  Coping Ability: English as a second language teacher Deficits:     Supports:      Family and Psychosocial History: Family history Marital status: Divorced Divorced, when?: Divorced twice Additional relationship information: Still friends with one ex Are you sexually active?: No What is your sexual orientation?: heterosexual Does patient have children?: No  Childhood History:  Childhood History By whom was/is the patient raised?: Mother, Both parents Additional childhood history information: Pt was born in [place].  "Choatic family."  When small child, pt's father committed suicide.  Mother left father when pt was age 28; moved in with her (mother's brother).  Mother had a fourth grade education.  Was a Engineer, manufacturing systems.  Mother and sister always had a very close bond.  "I wanted to be a part; but never could break their bond."                  Description of patient's relationship with caregiver when they were a child: Read above Does patient have siblings?: Yes Number of Siblings: 1 Description of patient's current relationship with siblings: Doesn't get along with older sister at all.  Reports having a lot of half siblings. 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  She didn't say that it's been impacted. What is the longest time patient has a held a job?: current job Has patient ever been in the [employer]?: No Has patient ever served in combat?: No Did You Receive Any Psychiatric Treatment/Services While in Passenger transport manager?: No Are There Guns or Other Weapons in [place]?: No Are These [place]?: Yes (n/a)  Education: Education Did Teacher, adult education From [employer]?: Yes Did  You Attend College?: Yes What Type of College Degree Do you Have?: Engineering Did You Attend Graduate School?: Yes What is Your Post Graduate Degree?: MA Engineering What Was Your Major?: Engineering Did You Have An Individualized Education Program (IIEP): No Did You Have Any Difficulty At School?: No  Religion: Religion/Spirituality Are You A Religious Person?: Yes What is Your Religious Affiliation?: Producer, television/film/video  Leisure/Recreation: Leisure / Recreation Leisure and Hobbies: Reading and watching college sports  Exercise/Diet: Exercise/Diet Do You Exercise?: No Have You Gained or Lost A Significant Amount of Weight in the Past Six Months?: No Do You Follow a Special Diet?: No Do You Have Any Trouble Sleeping?: Yes Explanation of Sleeping Difficulties: Difficulty getting asleep  CCA Part Two C  Alcohol/Drug Use: Alcohol / Drug Use Pain Medications:  oxycodone History of alcohol / drug use?: No history of alcohol / drug abuse                      CCA Part Three  ASAM's:  Six Dimensions of Multidimensional Assessment  Dimension 1:  Acute Intoxication and/or Withdrawal Potential:     Dimension 2:  Biomedical Conditions and Complications:     Dimension 3:  Emotional, Behavioral, or Cognitive Conditions and Complications:     Dimension 4:  Readiness to Change:     Dimension 5:  Relapse, Continued use, or Continued Problem Potential:     Dimension 6:  Recovery/Living Environment:      Substance use Disorder (SUD)    Social Function:  Social Functioning Social Maturity: Isolates Social Judgement: Normal  Stress:  Stress Stressors: Family conflict, Grief/losses, Illness, Chiropodist, Work Coping Ability: Overwhelmed Patient Takes Medications The Way The Doctor Instructed?: Yes Priority Risk: High Risk  Risk Assessment- Self-Harm Potential: Risk Assessment For Self-Harm Potential Thoughts of Self-Harm: No current thoughts Method: No plan Availability of Means: No access/NA Additional Information for Self-Harm Potential: Previous Attempts, Family History of Suicide  Risk Assessment -Dangerous to Others Potential: Risk Assessment For Dangerous to Others Potential Method: No Plan Availability of Means: No access or NA Intent: Vague intent or NA Notification Required: No need or identified person  DSM5 Diagnoses: Patient Active Problem List   Diagnosis Date Noted  . Elevated CK   . Moderate protein-calorie malnutrition ([place])   . Aspiration pneumonia ([place])   . Major depressive disorder, recurrent episode, severe ([place]) 04/05/2016  . Intentional drug overdose ([place]) 04/04/2016  . Suicidal ideation 04/04/2016  . Non-traumatic rhabdomyolysis 04/04/2016  . Osteoarthritis of right hip 01/25/2016  . 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 Cough 05/17/2014  . Lower back pain 04/19/2013  . Obesity (BMI  30-39.9) 06/14/2012  . Acute cholecystitis with chronic cholecystitis 06/04/2012  . 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 Chronic diarrhea 06/04/2012  . Poor venous access 06/04/2012  . Hemangioma of liver - left hepatic lobe 06/04/2012  . Right hip pain 09/12/2011  . History of detached retina repair 02/12/2011  . Preventative health care 02/08/2011  . INSOMNIA-SLEEP DISORDER-UNSPEC 11/21/2009  . DOE (dyspnea on exertion) 11/21/2009  . Essential hypertension 04/30/2008  . GERD 04/30/2008  . EPISTAXIS, RECURRENT 01/27/2008  . KNEE SPRAIN, ACUTE 01/27/2008  . FIBROMYALGIA 01/13/2008  . LEG PAIN, LEFT 01/13/2008  . Arthritis of right hip 10/25/2007  . OSTEOPENIA 07/26/2007  . HIP PAIN, RIGHT 04/30/2007  . HEPATITIS C 08/03/2006  . Diabetes ([place]) 08/03/2006  . Major depressive disorder, single episode 08/03/2006  . BREAST CANCER,  HX OF 08/03/2006    Patient Centered Plan: Patient is on the following Treatment Plan(s):  Depression, Impulse Control and Low Self-Esteem  Recommendations for Services/Supports/Treatments: Recommendations for Services/Supports/Treatments Recommendations For Services/Supports/Treatments: IOP (Intensive Outpatient Program)  Treatment Plan Summary:  Pt will attend MH-IOP on a daily basis to learn effective coping skills.  Encourage support groups.  Refer pt to a therapist and a psychiatrist.  Strongly recommend [facility] with Binnie Rail, LCAS on Tuesdays 5-6 pm.  Referrals to Alternative Service(s): Referred to Alternative Service(s):   Place:   Date:   Time:    Referred to Alternative Service(s):   Place:   Date:   Time:    Referred to Alternative Service(s):   Place:   Date:   Time:    Referred to Alternative Service(s):   Place:   Date:   Time:     Stanley, RITA

## 2016-04-17 ENCOUNTER — Other Ambulatory Visit (HOSPITAL_COMMUNITY): Payer: Medicare Other | Admitting: Psychiatry

## 2016-04-17 DIAGNOSIS — Z79899 Other long term (current) drug therapy: Secondary | ICD-10-CM | POA: Diagnosis not present

## 2016-04-17 DIAGNOSIS — F419 Anxiety disorder, unspecified: Secondary | ICD-10-CM | POA: Diagnosis not present

## 2016-04-17 DIAGNOSIS — F332 Major depressive disorder, recurrent severe without psychotic features: Secondary | ICD-10-CM

## 2016-04-17 DIAGNOSIS — M1990 Unspecified osteoarthritis, unspecified site: Secondary | ICD-10-CM | POA: Diagnosis not present

## 2016-04-17 DIAGNOSIS — Z853 Personal history of malignant neoplasm of breast: Secondary | ICD-10-CM | POA: Diagnosis not present

## 2016-04-17 DIAGNOSIS — M797 Fibromyalgia: Secondary | ICD-10-CM | POA: Diagnosis not present

## 2016-04-18 ENCOUNTER — Other Ambulatory Visit (HOSPITAL_COMMUNITY): Payer: Medicare Other | Admitting: Psychiatry

## 2016-04-18 DIAGNOSIS — Z79899 Other long term (current) drug therapy: Secondary | ICD-10-CM | POA: Diagnosis not present

## 2016-04-18 DIAGNOSIS — F332 Major depressive disorder, recurrent severe without psychotic features: Secondary | ICD-10-CM

## 2016-04-18 DIAGNOSIS — M1990 Unspecified osteoarthritis, unspecified site: Secondary | ICD-10-CM | POA: Diagnosis not present

## 2016-04-18 DIAGNOSIS — Z853 Personal history of malignant neoplasm of breast: Secondary | ICD-10-CM | POA: Diagnosis not present

## 2016-04-18 DIAGNOSIS — F419 Anxiety disorder, unspecified: Secondary | ICD-10-CM | POA: Diagnosis not present

## 2016-04-18 DIAGNOSIS — M797 Fibromyalgia: Secondary | ICD-10-CM | POA: Diagnosis not present

## 2016-04-18 NOTE — Progress Notes (Signed)
    Daily Group Progress Note  Program: IOP  Group Time: 9:00-12:00  Participation Level: Active  Behavioral Response: Appropriate  Type of Therapy:  Group Therapy  Summary of Progress: Pt. Presented as talkative, engaged in group process. Pt. Continues pattern of blunt and sometimes abrasive tone towards other patients. Pt. Participated in discussion about childhood woundings and developed expectation that relationships will either help to heal or deepen the wounds created in childhood. Participated in group facilitated by the mental health association about developing effective community supports.      Nancie Neas, LPC

## 2016-04-18 NOTE — Progress Notes (Signed)
    Daily Group Progress Note  Program: IOP  Group Time: 9:00-12:00   Participation Level: minimal   Behavioral Response:  responsive   Type of Therapy:  group therapy   Summary of Progress:  Patient saw the psychiatrist and the social worker, as this was her first day in group. She was overtly rude to the counselor, who has a soft voice, and bluntly demanded that the counselor speak more loudly to geriatric audiences.    Nancie Neas, LPC

## 2016-04-21 ENCOUNTER — Other Ambulatory Visit (HOSPITAL_COMMUNITY): Payer: Medicare Other | Admitting: Psychiatry

## 2016-04-21 DIAGNOSIS — F332 Major depressive disorder, recurrent severe without psychotic features: Secondary | ICD-10-CM | POA: Diagnosis not present

## 2016-04-21 DIAGNOSIS — F419 Anxiety disorder, unspecified: Secondary | ICD-10-CM | POA: Diagnosis not present

## 2016-04-21 DIAGNOSIS — M1990 Unspecified osteoarthritis, unspecified site: Secondary | ICD-10-CM | POA: Diagnosis not present

## 2016-04-21 DIAGNOSIS — Z853 Personal history of malignant neoplasm of breast: Secondary | ICD-10-CM | POA: Diagnosis not present

## 2016-04-21 DIAGNOSIS — Z79899 Other long term (current) drug therapy: Secondary | ICD-10-CM | POA: Diagnosis not present

## 2016-04-21 DIAGNOSIS — M797 Fibromyalgia: Secondary | ICD-10-CM | POA: Diagnosis not present

## 2016-04-22 ENCOUNTER — Telehealth (HOSPITAL_COMMUNITY): Payer: Self-pay | Admitting: Psychiatry

## 2016-04-22 ENCOUNTER — Other Ambulatory Visit (HOSPITAL_COMMUNITY): Payer: Medicare Other | Admitting: Psychiatry

## 2016-04-22 NOTE — Progress Notes (Signed)
    Daily Group Progress Note  Program: IOP         G Group Time: 9:00-12:00   Participation Level: active   Behavioral Response:  engaged/responsive   Type of Therapy: group therapy   Summary of Progress:  Patient spoke very positively about her situation in group. She discussed needing to create her own purpose as a way to elevate herself from her current emotional state. She also made a connection about how we often get stuck in negative thinking patterns, and that to get out of them we must change the way we think. Patient offered a lot of insight to group members, and the group responded positively and thoughtfully in turn.  Nancie Neas, LPC

## 2016-04-23 ENCOUNTER — Other Ambulatory Visit (HOSPITAL_COMMUNITY): Payer: Medicare Other | Admitting: Psychiatry

## 2016-04-23 DIAGNOSIS — Z79899 Other long term (current) drug therapy: Secondary | ICD-10-CM | POA: Diagnosis not present

## 2016-04-23 DIAGNOSIS — M1990 Unspecified osteoarthritis, unspecified site: Secondary | ICD-10-CM | POA: Diagnosis not present

## 2016-04-23 DIAGNOSIS — M797 Fibromyalgia: Secondary | ICD-10-CM | POA: Diagnosis not present

## 2016-04-23 DIAGNOSIS — F332 Major depressive disorder, recurrent severe without psychotic features: Secondary | ICD-10-CM

## 2016-04-23 DIAGNOSIS — F419 Anxiety disorder, unspecified: Secondary | ICD-10-CM | POA: Diagnosis not present

## 2016-04-23 DIAGNOSIS — Z853 Personal history of malignant neoplasm of breast: Secondary | ICD-10-CM | POA: Diagnosis not present

## 2016-04-23 NOTE — Progress Notes (Signed)
    Daily Group Progress Note  Program: IOP  Group Time: 9:00-12:00   Participation Level:  active   Behavioral Response:  engaged  Type of Therapy:  group therapy   Summary of Progress:  Patient had an interesting weekend. She has an ongoing struggle with her sister, with whom she's had a very difficult relationship. However, she feels she's finally been able to push back against her sister's wishes, and now she feels she's taken some power back to herself. She was very happy about this and is curious to see how her sister reacts to this change in her reactions. Pt. Participated in medication management group with the pharmacist.  Nancie Neas, [facility]

## 2016-04-24 ENCOUNTER — Other Ambulatory Visit (HOSPITAL_COMMUNITY): Payer: Medicare Other | Admitting: Psychiatry

## 2016-04-24 DIAGNOSIS — M1990 Unspecified osteoarthritis, unspecified site: Secondary | ICD-10-CM | POA: Diagnosis not present

## 2016-04-24 DIAGNOSIS — F332 Major depressive disorder, recurrent severe without psychotic features: Secondary | ICD-10-CM | POA: Diagnosis not present

## 2016-04-24 DIAGNOSIS — F419 Anxiety disorder, unspecified: Secondary | ICD-10-CM | POA: Diagnosis not present

## 2016-04-24 DIAGNOSIS — Z853 Personal history of malignant neoplasm of breast: Secondary | ICD-10-CM | POA: Diagnosis not present

## 2016-04-24 DIAGNOSIS — Z79899 Other long term (current) drug therapy: Secondary | ICD-10-CM | POA: Diagnosis not present

## 2016-04-24 DIAGNOSIS — M797 Fibromyalgia: Secondary | ICD-10-CM | POA: Diagnosis not present

## 2016-04-25 ENCOUNTER — Other Ambulatory Visit (HOSPITAL_COMMUNITY): Payer: Medicare Other | Admitting: Psychiatry

## 2016-04-25 DIAGNOSIS — F332 Major depressive disorder, recurrent severe without psychotic features: Secondary | ICD-10-CM

## 2016-04-25 DIAGNOSIS — F419 Anxiety disorder, unspecified: Secondary | ICD-10-CM | POA: Diagnosis not present

## 2016-04-25 DIAGNOSIS — M1990 Unspecified osteoarthritis, unspecified site: Secondary | ICD-10-CM | POA: Diagnosis not present

## 2016-04-25 DIAGNOSIS — Z79899 Other long term (current) drug therapy: Secondary | ICD-10-CM | POA: Diagnosis not present

## 2016-04-25 DIAGNOSIS — M797 Fibromyalgia: Secondary | ICD-10-CM | POA: Diagnosis not present

## 2016-04-25 DIAGNOSIS — Z853 Personal history of malignant neoplasm of breast: Secondary | ICD-10-CM | POA: Diagnosis not present

## 2016-04-28 ENCOUNTER — Ambulatory Visit (INDEPENDENT_AMBULATORY_CARE_PROVIDER_SITE_OTHER): Payer: Self-pay | Admitting: Physician Assistant

## 2016-04-28 ENCOUNTER — Other Ambulatory Visit (HOSPITAL_COMMUNITY): Payer: Medicare Other | Admitting: Psychiatry

## 2016-04-28 ENCOUNTER — Telehealth (HOSPITAL_COMMUNITY): Payer: Self-pay | Admitting: Psychiatry

## 2016-04-28 NOTE — Progress Notes (Signed)
    Daily Group Progress Note  Program: IOP  Group Time: 9:00-12:00   Participation Level:  active   Behavioral Response:  engaged   Type of Therapy:  group therapy   Summary of Progress:  Patient was locked out of her house, which she usually leaves unlocked and does not have a key for, by her sister, who dropped some things off when she was not there. She felt extremely upset about this, but continues to feel hopeful that she can change her relationship with her sister for the better. She reacted by calling her sister and leaving her an assertive, but not aggressive message, setting boundaries around how her sister may behave in her house. She reported feeling very excited because she has subscribed to the news paper, and that will now become part of her morning routine.   Nancie Neas, LPC

## 2016-04-28 NOTE — Progress Notes (Signed)
    Daily Group Progress Note  Program: IOP Group Time: 9:00-12:00   Participation Level:  active  Behavioral Response:  responsive   Type of Therapy:   group therapy   Summary of Progress:  Patient is feeling good, and has a new sense that she is going to be okay. Patient reports struggling in her relationship with her sister. Patient is excited to be adopting a rescue dog soon, and she feels certain this will help alleviate her depression.   Nancie Neas, LPC

## 2016-04-29 ENCOUNTER — Other Ambulatory Visit (HOSPITAL_COMMUNITY): Payer: Medicare Other | Admitting: Psychiatry

## 2016-04-29 DIAGNOSIS — M797 Fibromyalgia: Secondary | ICD-10-CM | POA: Diagnosis not present

## 2016-04-29 DIAGNOSIS — F419 Anxiety disorder, unspecified: Secondary | ICD-10-CM | POA: Diagnosis not present

## 2016-04-29 DIAGNOSIS — Z79899 Other long term (current) drug therapy: Secondary | ICD-10-CM | POA: Diagnosis not present

## 2016-04-29 DIAGNOSIS — Z853 Personal history of malignant neoplasm of breast: Secondary | ICD-10-CM | POA: Diagnosis not present

## 2016-04-29 DIAGNOSIS — M1990 Unspecified osteoarthritis, unspecified site: Secondary | ICD-10-CM | POA: Diagnosis not present

## 2016-04-29 DIAGNOSIS — F332 Major depressive disorder, recurrent severe without psychotic features: Secondary | ICD-10-CM | POA: Diagnosis not present

## 2016-04-29 NOTE — Progress Notes (Signed)
    Daily Group Progress Note  Program: IOP  Group Time: 9:00-12:00  Participation Level: Active  Behavioral Response: Appropriate  Type of Therapy:  Group Therapy  Summary of Progress: Pt. Reports that she is doing "pretty good". Pt. Has been following up on plan to adopt two dogs and is very excited about caring for her dogs. Pt. 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 Pt. Recognizes that her relationship with her sister has always been toxic, but is not decided not to allow her sister's personality to trigger her depression.      Nancie Neas, LPC

## 2016-04-30 ENCOUNTER — Other Ambulatory Visit (HOSPITAL_COMMUNITY): Payer: Medicare Other | Attending: Psychiatry | Admitting: Psychiatry

## 2016-04-30 DIAGNOSIS — F332 Major depressive disorder, recurrent severe without psychotic features: Secondary | ICD-10-CM | POA: Diagnosis not present

## 2016-04-30 MED ORDER — GABAPENTIN 100 MG PO CAPS
200.0000 mg | ORAL_CAPSULE | Freq: Three times a day (TID) | ORAL | 2 refills | Status: DC
Start: 2016-04-30 — End: 2016-06-16

## 2016-05-01 ENCOUNTER — Other Ambulatory Visit (HOSPITAL_COMMUNITY): Payer: Medicare Other | Admitting: Psychiatry

## 2016-05-01 DIAGNOSIS — F332 Major depressive disorder, recurrent severe without psychotic features: Secondary | ICD-10-CM | POA: Diagnosis not present

## 2016-05-02 ENCOUNTER — Other Ambulatory Visit (HOSPITAL_COMMUNITY): Payer: Medicare Other | Admitting: Psychiatry

## 2016-05-02 DIAGNOSIS — F332 Major depressive disorder, recurrent severe without psychotic features: Secondary | ICD-10-CM

## 2016-05-05 ENCOUNTER — Other Ambulatory Visit (HOSPITAL_COMMUNITY): Payer: Medicare Other | Admitting: Psychiatry

## 2016-05-05 DIAGNOSIS — F332 Major depressive disorder, recurrent severe without psychotic features: Secondary | ICD-10-CM

## 2016-05-05 NOTE — Progress Notes (Signed)
    Daily Group Progress Note  Program: IOP  Group Time: 9:00-12:00   Participation Level:: active   Behavioral Response:  responsive   Type of Therapy:   group therapy   Summary of Progress:  Patient felt a bit disappointed that she was unable to adopt a dog she'd set her sights on. However, she is accomplishing her goals in spite of this set back. She cleaned out a large portion of her house that had been filled with clutter. This made her feel a lot better, and she plans to continue cleaning things out, as she is a self professed "hoarder". She also went to a store recently and bought a bible. She feels uncertain why, and plans to think more about this.   Nancie Neas, LPC

## 2016-05-06 ENCOUNTER — Other Ambulatory Visit (HOSPITAL_COMMUNITY): Payer: Medicare Other | Admitting: Psychiatry

## 2016-05-06 DIAGNOSIS — F332 Major depressive disorder, recurrent severe without psychotic features: Secondary | ICD-10-CM | POA: Diagnosis not present

## 2016-05-06 NOTE — Patient Instructions (Signed)
D:  Pt completed MH-IOP today.  A:  Discharge today.  Follow up with Dr. Modesta Messing on 05-07-16 @ 12:45 pm and Audelia Acton, LCSW on 05-15-16 @ 1:30 pm.  Encouraged support groups.  Recommended [facility] with Beth.

## 2016-05-06 NOTE — Progress Notes (Signed)
    Daily Group Progress Note  Program: IOP  Group Time: 9:00-12:00   Participation Level:  active   Behavioral Response: engaged   Type of Therapy:   group therapy   Summary of Progress: Patient was alert and active during group.  Counselor and group members discussed positive psychology after watching a "The Positive [employer]" TedTalk.  Patient stated she would try some techniques provided in the video, specifically writing three gratitudes daily and random acts of kindness.  Counselor and group members discussed what image is portrayed on the outside versus what is felt on the inside.  Counselor and group discussed "levels" of sharing and trust.  Nancie Neas, LPC

## 2016-05-06 NOTE — Progress Notes (Signed)
[name] is a 65 y.o., employed, divorced, Caucasian female who was transitioned from [facility].  Pt was admitted post OD # 34 Temazepam in a suicide attempt.  Pt was alone and found five days later.  Pt reported a long hx of depression since childhood.  Pt continues to deny any current SI.  Also, denies HI or A/V hallucinations.  Triggers/Stressors:  Unresolved grief/loss issues:  On 01-25-16, pt had rt hip replacement.  States the surgery went well, but remains in physical therapy.  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  2)  Financial Strain:  States she's in a lot of debt.  Owes IRS $5,000 since 2005.  3)  Legal Issues:  Received a letter from [place].  "I got a criminal warrant d/t running into someone's car."  Pt states she doesn't remember doing this.  4)  Conflictual relationship with older sister.  Pt states she has always felt that she is the black sheep of the family.  Reports that sister is overbearing and controlling.  "I am tired of doing her song and dance.  I have demoted her to a cousin."  26)  No support system.  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  Pt reports no prior psychiatrist hospitalizations, besides recent one at [facility].  No prior suicide attempts.  Reported seeing a therapist fifteen yrs ago in [place], [place].  Family Hx:  Father and brother committed suicide.                                              Pt completed MH-IOP today.  Reports overall mood improving.  Pt is applying coping skills learned.  Anxious about being discharged and not having any structure.  Pt plans to attend support groups ([facility]) and [facility].  A:  D/C today.  F/U with Dr. Modesta Messing on 05-07-16 @ 12:45 pm and  Audelia Acton, LCSW on 05-05-16 @ 1:30 pm.  Encouraged support groups.  R:  Pt receptive.   Carlis Abbott, RITA, M.Ed, CNA

## 2016-05-06 NOTE — Progress Notes (Signed)
    Daily Group Progress Note  Program: IOP  Group Time: 9:00-12:00   Participation Level:  active    Behavioral Response:  engaged   Type of Therapy:  group therapy   Summary of Progress:  Patient continues to do well in group. She spoke today about finding novelty and enjoyment in the small things in life. She also has been tuning into her physical reactions and sensations as a way to understand her emotional state, and she's found this very helpful. Patient reports that she continues to clear out the clutter in her house, which is a relief. She has a strained and strange relationship with her brother in law, who she openly declared she hates. However, he was going over her house that day to help her out with some electronic issues she's been having. She asked him for a few extra favors as well, which she doubts will get done. Patient continues to communicate assertively and respectfully with her sister.   Nancie Neas, LPC

## 2016-05-06 NOTE — Progress Notes (Signed)
    Daily Group Progress Note  Program: IOP   Group Time: 9:00-12:00   Participation Level:  active   Behavioral Response:  responsive   Type of Therapy:   group therapy  Summary of Progress:  Patient reports feeling fearful this morning. Shared that she may be graduating from the current IOP program sooner than she anticipated. She also fears that after discharging from the program she may loose her sense of motivation. Patient reports feelings of anxiety related to her future mental health state. She often feels lonely and compensates by buying unnecessary items and hoarding. She has been using the strategies provided in group (breathing exercises, positive self talk, relaxing, etc.) She has found her psychiatric medications to also be effective. Patient was observed to be relaxed and in a good mood but anxious as she brought up facing discharge several times throughout the IOP. Counselor reminded patient that she would be discharged with an effective plan implemented and starting a new positive journey.  This resonated with the patient. She shared that facing her discharge would give her an opportunity to work on household duties such as cleaning, organizing, and decluttering items in her home.   Nancie Neas, LPC

## 2016-05-06 NOTE — Progress Notes (Signed)
Patient ID: JUANNA FEILEN, female   DOB: [birth year], 65 y.o.   MRN: [id number] [facility] IOP DISCHARGE NOTE  Patient:  LARRISHA PARVIN DOB:  Jan 21, 1951  Date of Admission: 04/16/2016  Date of Discharge: 05/06/2016  Reason for Admission:depression  IOP Course:Ms Chana Bode attended and participated.  She brightened up and said she did feel less depressed and more optimistic about herself and future.  She is dealing with her sister better and feels happier  Mental Status at [place] persists but less so and no suicidal thoughts  Diagnosis: major depression, recurrent, severe without psychotic features  Level of Care:  IOP  Discharge destination:  Return to outpatient therapy and medication management    Comments:  Risperidone was reduced to 1 mg daily.  She believes the medication has helped mood swings but has been concerned about weight gain ans she worked hard to lose 55 pounds and does not want to gain it back she says.  The patient received suicide prevention pamphlet:  Yes   Donnelly Angelica, MD
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
## 2016-05-07 ENCOUNTER — Other Ambulatory Visit (HOSPITAL_COMMUNITY): Payer: Medicare Other

## 2016-05-07 ENCOUNTER — Ambulatory Visit (INDEPENDENT_AMBULATORY_CARE_PROVIDER_SITE_OTHER): Payer: Medicare Other | Admitting: Psychiatry

## 2016-05-07 ENCOUNTER — Encounter (HOSPITAL_COMMUNITY): Payer: Self-pay | Admitting: Psychiatry

## 2016-05-07 VITALS — BP 118/70 | HR 100 | Ht 60.5 in | Wt 165.0 lb

## 2016-05-07 DIAGNOSIS — Z87891 Personal history of nicotine dependence: Secondary | ICD-10-CM

## 2016-05-07 DIAGNOSIS — Z803 Family history of malignant neoplasm of breast: Secondary | ICD-10-CM

## 2016-05-07 DIAGNOSIS — Z8249 Family history of ischemic heart disease and other diseases of the circulatory system: Secondary | ICD-10-CM | POA: Diagnosis not present

## 2016-05-07 DIAGNOSIS — Z818 Family history of other mental and behavioral disorders: Secondary | ICD-10-CM | POA: Diagnosis not present

## 2016-05-07 DIAGNOSIS — Z79899 Other long term (current) drug therapy: Secondary | ICD-10-CM

## 2016-05-07 DIAGNOSIS — F331 Major depressive disorder, recurrent, moderate: Secondary | ICD-10-CM | POA: Diagnosis not present

## 2016-05-07 MED ORDER — DULOXETINE HCL 30 MG PO CPEP: ORAL_CAPSULE | ORAL | 0 refills | Status: DC

## 2016-05-07 NOTE — Patient Instructions (Addendum)
1. Decrease Risperidone 1 mg at night 2. Continue Temazepam 30 mg at night as needed for insomnia 3. Start Duloxetine 30 mg daily for two weeks, then 60 mg daily 4. Return to clinic in one month

## 2016-05-08 ENCOUNTER — Other Ambulatory Visit (HOSPITAL_COMMUNITY): Payer: Medicare Other

## 2016-05-09 ENCOUNTER — Other Ambulatory Visit (HOSPITAL_COMMUNITY): Payer: Medicare Other

## 2016-05-09 DIAGNOSIS — H2513 Age-related nuclear cataract, bilateral: Secondary | ICD-10-CM | POA: Diagnosis not present

## 2016-05-09 DIAGNOSIS — Z0100 Encounter for examination of eyes and vision without abnormal findings: Secondary | ICD-10-CM | POA: Diagnosis not present

## 2016-05-12 ENCOUNTER — Ambulatory Visit (INDEPENDENT_AMBULATORY_CARE_PROVIDER_SITE_OTHER): Payer: Medicare Other | Admitting: Licensed Clinical Social Worker

## 2016-05-12 ENCOUNTER — Other Ambulatory Visit (HOSPITAL_COMMUNITY): Payer: Medicare Other

## 2016-05-12 DIAGNOSIS — F332 Major depressive disorder, recurrent severe without psychotic features: Secondary | ICD-10-CM

## 2016-05-12 NOTE — Progress Notes (Signed)
    Daily Group Progress Note  Program: IOP  Group Time: 9:00-12:00   Participation Level: active   Behavioral Response:  responsive   Type of Therapy:   group therapy   Summary of Progress:  Today was the pt's discharge day. She briefly discussed a documentary she had watched that summarized the tendencies of generations spanning the 20th to 21st centuries. She expressed that she enjoyed the weekend and plans to go easy on her self after she discharges.    Nancie Neas, LPC

## 2016-05-12 NOTE — Progress Notes (Signed)
    Daily Group Progress Note  Program: IOP  Group Time: 9:00-12:00   Participation Level: active   Behavioral Response:  responsive   Type of Therapy:   group therapy   Summary of Progress:  Patient reported having a good weekend. She enjoyed watching college football, and allowed herself to rest as needed. She also attended a [employer], and intends to keep this social contact going after she discharges from the group tomorrow. Patient reported feeling afraid about discharging, but that she has a number of coping mechanisms in place to support herself.   Nancie Neas, LPC

## 2016-05-13 ENCOUNTER — Other Ambulatory Visit (HOSPITAL_COMMUNITY): Payer: Medicare Other

## 2016-05-13 ENCOUNTER — Encounter (HOSPITAL_COMMUNITY): Payer: Self-pay | Admitting: Licensed Clinical Social Worker

## 2016-05-13 NOTE — Progress Notes (Signed)
Daily Group Progress Note Program:  Outpatient  Group Time: 5:30-6:30 pm  Participation Level: Active  Behavioral Response: Appropriate  Type of Therapy:  Psychoeducation/Therapy  Summary of Progress:  Today was the pt's first day in the OP group. She introduced herself to the group describing what brought her to treatment. The group welcomed her. Pt participated in a discussion about coping skills for her depression. Pt shared with the group the coping skills that work for her: being present and mindful , which gave suggestions to other group members. Pt was encouraged to continue using her coping skills and to be open to learning more. Pt was receptive to suggestions and intervention.  Jenkins Rouge, LCAS

## 2016-05-14 ENCOUNTER — Other Ambulatory Visit (HOSPITAL_COMMUNITY): Payer: Medicare Other

## 2016-05-15 ENCOUNTER — Other Ambulatory Visit (HOSPITAL_COMMUNITY): Payer: Medicare Other

## 2016-05-15 ENCOUNTER — Ambulatory Visit (HOSPITAL_COMMUNITY): Payer: Self-pay | Admitting: Clinical

## 2016-05-16 ENCOUNTER — Other Ambulatory Visit (HOSPITAL_COMMUNITY): Payer: Medicare Other

## 2016-05-19 ENCOUNTER — Other Ambulatory Visit (HOSPITAL_COMMUNITY): Payer: Medicare Other

## 2016-05-19 ENCOUNTER — Ambulatory Visit (HOSPITAL_COMMUNITY): Payer: Medicare Other | Admitting: Licensed Clinical Social Worker

## 2016-05-19 DIAGNOSIS — F332 Major depressive disorder, recurrent severe without psychotic features: Secondary | ICD-10-CM

## 2016-05-20 ENCOUNTER — Other Ambulatory Visit (HOSPITAL_COMMUNITY): Payer: Medicare Other

## 2016-05-20 ENCOUNTER — Encounter (HOSPITAL_COMMUNITY): Payer: Self-pay | Admitting: Licensed Clinical Social Worker

## 2016-05-20 NOTE — Progress Notes (Signed)
Daily Group Progress Note Program:  Outpatient  Group Time: 5:30-6:30 pm  Participation Level: Active  Behavioral Response: Appropriate  Type of Therapy:  Psychoeducation/Therapy  Summary of Progress: Pt participated in a discussion on relationships in mental health recovery. Pt completed an activity on "The Relationship Circle": intimate, close, and acquaintances. A discussion ensued on the placement in the circle of people allowed in her life. The discussion continued where patient showed how some people move around in different circles so that she may protect herself. Pt was encouraged to continue to use boundaries with people in her life for healthy self-care.  Jenkins Rouge, LCAS

## 2016-05-21 ENCOUNTER — Ambulatory Visit (INDEPENDENT_AMBULATORY_CARE_PROVIDER_SITE_OTHER): Payer: Medicare Other | Admitting: Licensed Clinical Social Worker

## 2016-05-21 ENCOUNTER — Other Ambulatory Visit (HOSPITAL_COMMUNITY): Payer: Medicare Other

## 2016-05-21 DIAGNOSIS — F332 Major depressive disorder, recurrent severe without psychotic features: Secondary | ICD-10-CM | POA: Diagnosis not present

## 2016-05-23 ENCOUNTER — Other Ambulatory Visit (HOSPITAL_COMMUNITY): Payer: Medicare Other

## 2016-05-26 ENCOUNTER — Ambulatory Visit (INDEPENDENT_AMBULATORY_CARE_PROVIDER_SITE_OTHER): Payer: Medicare Other | Admitting: Licensed Clinical Social Worker

## 2016-05-26 ENCOUNTER — Other Ambulatory Visit (HOSPITAL_COMMUNITY): Payer: Medicare Other

## 2016-05-26 DIAGNOSIS — F332 Major depressive disorder, recurrent severe without psychotic features: Secondary | ICD-10-CM

## 2016-05-27 ENCOUNTER — Encounter (HOSPITAL_COMMUNITY): Payer: Self-pay | Admitting: Licensed Clinical Social Worker

## 2016-05-27 ENCOUNTER — Telehealth (HOSPITAL_COMMUNITY): Payer: Self-pay

## 2016-05-27 ENCOUNTER — Other Ambulatory Visit (HOSPITAL_COMMUNITY): Payer: Medicare Other

## 2016-05-27 MED ORDER — RISPERIDONE 2 MG PO TBDP: 2.0000 mg | ORAL_TABLET | Freq: Every day | ORAL | 0 refills | Status: DC

## 2016-05-27 NOTE — Telephone Encounter (Signed)
Medication management - Telephone message left for patient to inform Dr. Modesta Messing approved her return to Risperdal 2 mg at night and had sent in a new order for her.  Requested patient to call back if any problems.

## 2016-05-27 NOTE — Progress Notes (Signed)
Daily Group Progress Note Program:  Outpatient  Group Time: 5:30-6:30 pm  Participation Level: Active  Behavioral Response: Appropriate  Type of Therapy:  Psychoeducation/Therapy  Summary of Progress:  Pt participated in a discussion on setting goals for mental health recovery.  Pt discussed how to plan her future on her journey of recovery. Pt was encouraged to set her personal goals - big and small to see small personal successes, which will encourage her to focus on what is important .  Jenkins Rouge, LCAS

## 2016-05-27 NOTE — Telephone Encounter (Signed)
Medication problem - Patient in to see therapist this date and is requesting increase back in Risperdal 2mg  at night.  States she had a hard time getting out of bed over Thanksgiving with the decrease. Patient returns for next appointment 06/09/16.

## 2016-05-27 NOTE — Progress Notes (Signed)
Daily Group Progress Note Program:  Outpatient  Group Time: 5:30-6:30 pm  Participation Level: Active  Behavioral Response: Appropriate  Type of Therapy:  Psychoeducation/Therapy  Summary of Progress:  Patient participated in a group discussion on "Surviving family during the holidays while working on my own mental wellness." The holidays can be enjoyable but can be filled with increased tension and encounters with people who can be triggering. Pt pointed out that her normal reactionary behavior often caused more problems during holidays, but was able to remove herself from the festivities to process and then was able to return in a thoughtful and responsible way. Pt was encouraged to continue to use these behaviors in future family conflicts that may be triggering. Pt was engaged during the intervention.  Jenkins Rouge, LCAS

## 2016-05-27 NOTE — Telephone Encounter (Signed)
Patient prefers to be back on risperdal 2 mg due to difficulty getting out of bed per reports. Will increase back to this dose.

## 2016-05-27 NOTE — Telephone Encounter (Signed)
Patient can be back to Riperdal 2 mg at night. Ordered medication in case she needs it.

## 2016-05-28 ENCOUNTER — Other Ambulatory Visit (HOSPITAL_COMMUNITY): Payer: Medicare Other

## 2016-05-28 ENCOUNTER — Ambulatory Visit (HOSPITAL_COMMUNITY): Payer: Self-pay | Admitting: Licensed Clinical Social Worker

## 2016-05-28 ENCOUNTER — Telehealth (HOSPITAL_COMMUNITY): Payer: Self-pay

## 2016-05-28 DIAGNOSIS — F331 Major depressive disorder, recurrent, moderate: Secondary | ICD-10-CM

## 2016-05-28 MED ORDER — RISPERIDONE 2 MG PO TABS: 2.0000 mg | ORAL_TABLET | Freq: Every day | ORAL | 0 refills | Status: DC

## 2016-05-28 NOTE — Telephone Encounter (Signed)
Medication management - Telephone message left for patient after she left one to inform Dr. Modesta Messing approved change to regular Risperdal 2 mg one a day and order sent to her [place] as she requested.  Reminded patient of appointment 06/09/16 and patient to call back if any other concerns.  New order e-scribed as approved by Dr. Modesta Messing and discontinued Risperdal 2 mg M-tab formula.

## 2016-05-29 ENCOUNTER — Other Ambulatory Visit (HOSPITAL_COMMUNITY): Payer: Medicare Other

## 2016-05-29 ENCOUNTER — Telehealth (HOSPITAL_COMMUNITY): Payer: Self-pay | Admitting: Licensed Clinical Social Worker

## 2016-05-30 ENCOUNTER — Other Ambulatory Visit (HOSPITAL_COMMUNITY): Payer: Medicare Other

## 2016-06-02 ENCOUNTER — Other Ambulatory Visit (HOSPITAL_COMMUNITY): Payer: Medicare Other

## 2016-06-02 ENCOUNTER — Ambulatory Visit (INDEPENDENT_AMBULATORY_CARE_PROVIDER_SITE_OTHER): Payer: Medicare Other | Admitting: Licensed Clinical Social Worker

## 2016-06-02 DIAGNOSIS — F331 Major depressive disorder, recurrent, moderate: Secondary | ICD-10-CM | POA: Diagnosis not present

## 2016-06-03 ENCOUNTER — Other Ambulatory Visit (HOSPITAL_COMMUNITY): Payer: Medicare Other

## 2016-06-03 NOTE — Progress Notes (Signed)
Daily Group Progress Note Program:  Outpatient  Group Time: 5:30-6:30 pm Participation Level: Active Behavioral Response: Appropriate Type of Therapy:  Psychoeducation/Therapy Summary of Progress: Pt participated in a discussion "Sitting with uncomfortable feelings," which means sitting in the discomfort and waiting before acting. Pt shared she feels she always needs to act based on her emotions, often making damaging decisions. Pt was encouraged to continue to develop her emotional intelligence, learn to sit with her negative feelings and create situations for positive feelings. Pt was receptive to the suggestion during the intervention.  Jenkins Rouge, LCAS 06/02/16

## 2016-06-04 ENCOUNTER — Other Ambulatory Visit (HOSPITAL_COMMUNITY): Payer: Medicare Other

## 2016-06-04 ENCOUNTER — Ambulatory Visit (INDEPENDENT_AMBULATORY_CARE_PROVIDER_SITE_OTHER): Payer: Medicare Other | Admitting: Licensed Clinical Social Worker

## 2016-06-04 DIAGNOSIS — F331 Major depressive disorder, recurrent, moderate: Secondary | ICD-10-CM

## 2016-06-05 ENCOUNTER — Encounter (HOSPITAL_COMMUNITY): Payer: Self-pay | Admitting: Licensed Clinical Social Worker

## 2016-06-05 ENCOUNTER — Other Ambulatory Visit (HOSPITAL_COMMUNITY): Payer: Medicare Other
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
## 2016-06-05 NOTE — Progress Notes (Signed)
Daily Group Progress Note Program:  Outpatient  Group Time: 5:30-6:30 pm Participation Level: Active Behavioral Response: Appropriate Type of Therapy:  Psychoeducation/Therapy Summary of Progress: Pt participated in a discussion on feeling emotionally overwhelmed. Pt expressed her state of being with intense emotions that are difficult to manage, affect her ability to think and act rationally or perform in a sufficient and functional manner. Pt was encouraged to work through key issues, trying to discover the roots of her overwhelming emotions and explore ways to prevent her emotions from becoming overwhelming, and use her coping skills to deal with potential emotional stressors that cannot be prevented. Pt was open to suggestions during the intervention.  Jenkins Rouge, LCAS

## 2016-06-06 ENCOUNTER — Other Ambulatory Visit (HOSPITAL_COMMUNITY): Payer: Medicare Other

## 2016-06-09 ENCOUNTER — Ambulatory Visit (HOSPITAL_COMMUNITY): Payer: Self-pay | Admitting: Psychiatry

## 2016-06-09 ENCOUNTER — Other Ambulatory Visit (HOSPITAL_COMMUNITY): Payer: Medicare Other

## 2016-06-09 ENCOUNTER — Ambulatory Visit (INDEPENDENT_AMBULATORY_CARE_PROVIDER_SITE_OTHER): Payer: Medicare Other | Admitting: Licensed Clinical Social Worker

## 2016-06-09 DIAGNOSIS — F331 Major depressive disorder, recurrent, moderate: Secondary | ICD-10-CM | POA: Diagnosis not present

## 2016-06-10 ENCOUNTER — Other Ambulatory Visit (HOSPITAL_COMMUNITY): Payer: Medicare Other

## 2016-06-10 ENCOUNTER — Ambulatory Visit (INDEPENDENT_AMBULATORY_CARE_PROVIDER_SITE_OTHER): Payer: Medicare Other | Admitting: Clinical

## 2016-06-10 ENCOUNTER — Encounter (HOSPITAL_COMMUNITY): Payer: Self-pay | Admitting: Licensed Clinical Social Worker

## 2016-06-10 DIAGNOSIS — F332 Major depressive disorder, recurrent severe without psychotic features: Secondary | ICD-10-CM | POA: Diagnosis not present

## 2016-06-10 NOTE — Progress Notes (Signed)
Daily Group Progress Note Program:  Outpatient  Group Time: 5:30-6:30 pm Participation Level: Minimal Behavioral Response: Defensive Type of Therapy:  Psychoeducation/Therapy Summary of Progress: Pt did not participate in a discussion on how others may affect  her emotional health by affecting her thoughts, feelings and even behaviors. Pt appeared agitated and frustrated during group process.   Jenkins Rouge, LCAS

## 2016-06-11 ENCOUNTER — Ambulatory Visit (INDEPENDENT_AMBULATORY_CARE_PROVIDER_SITE_OTHER): Payer: Medicare Other | Admitting: Licensed Clinical Social Worker

## 2016-06-11 DIAGNOSIS — F331 Major depressive disorder, recurrent, moderate: Secondary | ICD-10-CM | POA: Diagnosis not present

## 2016-06-15 ENCOUNTER — Encounter (HOSPITAL_COMMUNITY): Payer: Self-pay | Admitting: Clinical
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
## 2016-06-16 ENCOUNTER — Telehealth (HOSPITAL_COMMUNITY): Payer: Self-pay

## 2016-06-16 ENCOUNTER — Other Ambulatory Visit (HOSPITAL_COMMUNITY): Payer: Self-pay | Admitting: Psychiatry

## 2016-06-16 ENCOUNTER — Ambulatory Visit (INDEPENDENT_AMBULATORY_CARE_PROVIDER_SITE_OTHER): Payer: Medicare Other | Admitting: Licensed Clinical Social Worker

## 2016-06-16 DIAGNOSIS — F331 Major depressive disorder, recurrent, moderate: Secondary | ICD-10-CM

## 2016-06-16 MED ORDER — RISPERIDONE 2 MG PO TABS: 2.0000 mg | ORAL_TABLET | Freq: Every day | ORAL | 1 refills | Status: DC

## 2016-06-16 MED ORDER — GABAPENTIN 100 MG PO CAPS: 200.0000 mg | ORAL_CAPSULE | Freq: Three times a day (TID) | ORAL | 1 refills | Status: DC

## 2016-06-16 MED ORDER — DULOXETINE HCL 60 MG PO CPEP: 60.0000 mg | ORAL_CAPSULE | Freq: Every day | ORAL | 1 refills | Status: DC

## 2016-06-16 NOTE — Telephone Encounter (Signed)
Patient missed her appointment with you last week, she has rescheduled for Feb. but needs refills on medications - patient is on Cymbalta, Neurontin, and Risperidone. Please review and advise, thank you

## 2016-06-16 NOTE — Telephone Encounter (Signed)
Ordered refills to last for two months.

## 2016-06-17 ENCOUNTER — Encounter (HOSPITAL_COMMUNITY): Payer: Self-pay | Admitting: Licensed Clinical Social Worker

## 2016-06-17 NOTE — Progress Notes (Signed)
Daily Group Progress Note Program:  Outpatient   Group Time: 5:30-6:30 pm  Participation Level: Active  Behavioral Response: Appropriate  Type of Therapy:  Psychoeducation/Therapy  Summary of Progress: Pt participated in a discussion on identifying and coping with new feelings in mental health recovery. Pt was encouraged to continue to talk about her feelings in group, journal her thoughts and feelings and continue to use her effective coping skills.    Jenkins Rouge, LCAS

## 2016-06-18 ENCOUNTER — Telehealth (HOSPITAL_COMMUNITY): Payer: Self-pay | Admitting: Licensed Clinical Social Worker

## 2016-06-18 ENCOUNTER — Encounter (HOSPITAL_COMMUNITY): Payer: Self-pay | Admitting: Licensed Clinical Social Worker

## 2016-06-18 ENCOUNTER — Ambulatory Visit (HOSPITAL_COMMUNITY): Payer: Self-pay | Admitting: Licensed Clinical Social Worker

## 2016-06-18 NOTE — Progress Notes (Signed)
Daily Group Progress Note Program:  Outpatient   Group Time: 5:30-6:30 pm  Participation Level: Active  Behavioral Response: Appropriate  Type of Therapy:  Psychoeducation/Therapy  Summary of Progress: Pt participated in a discussion on the effects of relationships on mental health recovery. Normally, persons with mental illness have small social networks than others and have more family members than friends in their relationship circles. Pt was encouraged to widen her circle of relationships by working more on her own self-care and coping skills for mental wellness.  Nara Visa

## 2016-06-19 DIAGNOSIS — M79604 Pain in right leg: Secondary | ICD-10-CM | POA: Diagnosis not present

## 2016-06-19 DIAGNOSIS — M545 Low back pain: Secondary | ICD-10-CM | POA: Diagnosis not present

## 2016-06-19 DIAGNOSIS — M6281 Muscle weakness (generalized): Secondary | ICD-10-CM | POA: Diagnosis not present

## 2016-06-19 DIAGNOSIS — R262 Difficulty in walking, not elsewhere classified: Secondary | ICD-10-CM | POA: Diagnosis not present

## 2016-06-23 ENCOUNTER — Ambulatory Visit (HOSPITAL_COMMUNITY): Payer: Self-pay | Admitting: Licensed Clinical Social Worker

## 2016-06-27 DIAGNOSIS — M545 Low back pain: Secondary | ICD-10-CM | POA: Diagnosis not present

## 2016-06-27 DIAGNOSIS — M79604 Pain in right leg: Secondary | ICD-10-CM | POA: Diagnosis not present

## 2016-06-27 DIAGNOSIS — M6281 Muscle weakness (generalized): Secondary | ICD-10-CM | POA: Diagnosis not present

## 2016-06-27 DIAGNOSIS — R262 Difficulty in walking, not elsewhere classified: Secondary | ICD-10-CM | POA: Diagnosis not present

## 2016-07-01 DIAGNOSIS — M6281 Muscle weakness (generalized): Secondary | ICD-10-CM | POA: Diagnosis not present

## 2016-07-01 DIAGNOSIS — M545 Low back pain: Secondary | ICD-10-CM | POA: Diagnosis not present

## 2016-07-01 DIAGNOSIS — M79604 Pain in right leg: Secondary | ICD-10-CM | POA: Diagnosis not present

## 2016-07-01 DIAGNOSIS — R262 Difficulty in walking, not elsewhere classified: Secondary | ICD-10-CM | POA: Diagnosis not present

## 2016-07-03 DIAGNOSIS — E119 Type 2 diabetes mellitus without complications: Secondary | ICD-10-CM | POA: Diagnosis not present

## 2016-07-03 DIAGNOSIS — I10 Essential (primary) hypertension: Secondary | ICD-10-CM | POA: Diagnosis not present

## 2016-07-08 DIAGNOSIS — M545 Low back pain: Secondary | ICD-10-CM | POA: Diagnosis not present

## 2016-07-08 DIAGNOSIS — M79604 Pain in right leg: Secondary | ICD-10-CM | POA: Diagnosis not present

## 2016-07-08 DIAGNOSIS — R262 Difficulty in walking, not elsewhere classified: Secondary | ICD-10-CM | POA: Diagnosis not present

## 2016-07-08 DIAGNOSIS — M6281 Muscle weakness (generalized): Secondary | ICD-10-CM | POA: Diagnosis not present

## 2016-07-09 ENCOUNTER — Ambulatory Visit (INDEPENDENT_AMBULATORY_CARE_PROVIDER_SITE_OTHER): Payer: Medicare Other | Admitting: Licensed Clinical Social Worker

## 2016-07-09 DIAGNOSIS — F331 Major depressive disorder, recurrent, moderate: Secondary | ICD-10-CM

## 2016-07-10 ENCOUNTER — Ambulatory Visit (HOSPITAL_COMMUNITY): Payer: Self-pay | Admitting: Clinical

## 2016-07-10 ENCOUNTER — Encounter (HOSPITAL_COMMUNITY): Payer: Self-pay | Admitting: Licensed Clinical Social Worker

## 2016-07-10 NOTE — Progress Notes (Signed)
Daily Group Progress Note Program:  Outpatient  Group Time: 5:30-6:30 pm  Participation Level: Active  Behavioral Response: Appropriate  Type of Therapy:  Psychoeducation/Therapy  Summary of Progress: Pt participated in a discussion on "finding purpose in my life" during mental health recovery. While suffering from a mental illness pt must satisfy herself spiritually, emotionally and mentally. The recovery process itself sparks passion to inspire growth. Pt was encouraged to continue to work on her self-discovery which plays a role in her recovery process. Pt was actively engaged in the intervention.    Jenkins Rouge, LCAS

## 2016-07-11 DIAGNOSIS — R262 Difficulty in walking, not elsewhere classified: Secondary | ICD-10-CM | POA: Diagnosis not present

## 2016-07-11 DIAGNOSIS — M545 Low back pain: Secondary | ICD-10-CM | POA: Diagnosis not present

## 2016-07-11 DIAGNOSIS — M79604 Pain in right leg: Secondary | ICD-10-CM | POA: Diagnosis not present

## 2016-07-11 DIAGNOSIS — M6281 Muscle weakness (generalized): Secondary | ICD-10-CM | POA: Diagnosis not present

## 2016-07-14 ENCOUNTER — Ambulatory Visit (HOSPITAL_COMMUNITY): Payer: Medicare Other | Admitting: Licensed Clinical Social Worker

## 2016-07-14 ENCOUNTER — Encounter (HOSPITAL_COMMUNITY): Payer: Self-pay

## 2016-07-16 ENCOUNTER — Ambulatory Visit (HOSPITAL_COMMUNITY): Payer: Self-pay | Admitting: Licensed Clinical Social Worker

## 2016-07-17 ENCOUNTER — Ambulatory Visit (HOSPITAL_COMMUNITY): Payer: Self-pay | Admitting: Clinical

## 2016-07-17 ENCOUNTER — Ambulatory Visit (HOSPITAL_COMMUNITY): Payer: Self-pay | Admitting: Psychiatry

## 2016-07-21 ENCOUNTER — Encounter (INDEPENDENT_AMBULATORY_CARE_PROVIDER_SITE_OTHER): Payer: Self-pay

## 2016-07-21 ENCOUNTER — Ambulatory Visit (INDEPENDENT_AMBULATORY_CARE_PROVIDER_SITE_OTHER): Payer: Medicare Other | Admitting: Licensed Clinical Social Worker

## 2016-07-21 ENCOUNTER — Telehealth (HOSPITAL_COMMUNITY): Payer: Self-pay | Admitting: Licensed Clinical Social Worker

## 2016-07-21 DIAGNOSIS — F331 Major depressive disorder, recurrent, moderate: Secondary | ICD-10-CM

## 2016-07-22 ENCOUNTER — Encounter (HOSPITAL_COMMUNITY): Payer: Self-pay | Admitting: Licensed Clinical Social Worker

## 2016-07-22 DIAGNOSIS — M79604 Pain in right leg: Secondary | ICD-10-CM | POA: Diagnosis not present

## 2016-07-22 DIAGNOSIS — M6281 Muscle weakness (generalized): Secondary | ICD-10-CM | POA: Diagnosis not present

## 2016-07-22 DIAGNOSIS — R262 Difficulty in walking, not elsewhere classified: Secondary | ICD-10-CM | POA: Diagnosis not present

## 2016-07-22 DIAGNOSIS — M545 Low back pain: Secondary | ICD-10-CM | POA: Diagnosis not present

## 2016-07-22 NOTE — Progress Notes (Signed)
Daily Group Progress Note Program:  Outpatient  Group Time: 5:30-6:30 pm  Participation Level: Active  Behavioral Response: Appropriate  Type of Therapy:  Psychoeducation/Therapy  Summary of Progress: Pt participated in a discussion on "Moving forward in change" during mental health recovery. Mental health recovery can be a challenge or even stalled but "I must always be open to change" by not letting the mental health diagnosis define me. Pt was encouraged to continue to come to a place of acceptance. Without acceptance, there is no peace. The first step in recovery. Pt was engaged in the intervention.  Jenkins Rouge, LCAS

## 2016-07-23 ENCOUNTER — Ambulatory Visit (INDEPENDENT_AMBULATORY_CARE_PROVIDER_SITE_OTHER): Payer: Medicare Other | Admitting: Licensed Clinical Social Worker

## 2016-07-23 DIAGNOSIS — F331 Major depressive disorder, recurrent, moderate: Secondary | ICD-10-CM | POA: Diagnosis not present

## 2016-07-24 ENCOUNTER — Other Ambulatory Visit (HOSPITAL_COMMUNITY): Payer: Self-pay

## 2016-07-24 ENCOUNTER — Ambulatory Visit (INDEPENDENT_AMBULATORY_CARE_PROVIDER_SITE_OTHER): Payer: Medicare Other | Admitting: Psychiatry

## 2016-07-24 ENCOUNTER — Encounter (INDEPENDENT_AMBULATORY_CARE_PROVIDER_SITE_OTHER): Payer: Self-pay

## 2016-07-24 ENCOUNTER — Ambulatory Visit (INDEPENDENT_AMBULATORY_CARE_PROVIDER_SITE_OTHER): Payer: Medicare Other | Admitting: Specialist

## 2016-07-24 ENCOUNTER — Encounter (HOSPITAL_COMMUNITY): Payer: Self-pay | Admitting: Licensed Clinical Social Worker

## 2016-07-24 ENCOUNTER — Ambulatory Visit (HOSPITAL_COMMUNITY): Payer: Self-pay | Admitting: Clinical

## 2016-07-24 DIAGNOSIS — F331 Major depressive disorder, recurrent, moderate: Secondary | ICD-10-CM

## 2016-07-24 MED ORDER — TEMAZEPAM 30 MG PO CAPS: 30.0000 mg | ORAL_CAPSULE | Freq: Every evening | ORAL | 0 refills | Status: DC

## 2016-07-24 NOTE — Progress Notes (Signed)
Daily Group Progress Note Program:  Outpatient  Group Time: 5:30-6:30 pm Participation Level: Active Behavioral Response: Appropriate Type of Therapy:  Psychoeducation/Therapy Summary of Progress: Pt participated in a discussion on anger. Pt participated in the "umbrella activity" where she identified feelings where she reacted using anger.  Pt was encouraged to continue using her healthy ways to cope with anger as a normal part of life. Pt actively participated in the intervention.  Jenkins Rouge, LCAS

## 2016-07-24 NOTE — Telephone Encounter (Signed)
Called pt. Her phone disconnected me 2x.  [name] s. Noah Delaine, LCAS

## 2016-07-28 ENCOUNTER — Ambulatory Visit (HOSPITAL_COMMUNITY): Payer: Self-pay | Admitting: Licensed Clinical Social Worker

## 2016-07-29 NOTE — Progress Notes (Signed)
   THERAPIST PROGRESS NOTE  Session Time: 2:05-3:00  Participation Level: Active  Behavioral Response: CasualAlertEuthymic  Type of Therapy: Individual Therapy  Treatment Goals addressed: Coping  Interventions: Strength-based and Supportive  Summary: [name] is a 66 y.o. female who presents with major depressive disorder.   Suicidal/Homicidal: Nowithout intent/plan  Therapist Response: Pt. Presents with euthymic mood. Pt. Reports that she did not think previous counselor's style was a good match for her. Pt. Discussed that she has been going to aftercare group since her discharge from [facility] and does not think that the group has helped with her depression, but she is aware that it is very important for her to get dressed and out of the house as much as possible. Pt. Discussed that she wants to research other groups and expressed interest in going to the [facility]. Counselor looked at services provided by the [facility] and Pt. Expressed interest in the bridge club and reading group. Pt. Has maintained her interest in football which is a solitary activity for her and also a barrier to participation in support groups that meet on Saturday mornings. Counselor also recommended Monday & Wednesday co-occurring group but would like to begin when her current aftercare group comes to an end. Session focused on developing adequate community and social supports.  Plan: Return again in 2 weeks.  Diagnosis: Axis I: Depressive Disorder NOS    Axis II: No diagnosis    Nancie Neas, [facility] 07/29/2016

## 2016-07-30 ENCOUNTER — Ambulatory Visit (HOSPITAL_COMMUNITY): Payer: Self-pay | Admitting: Licensed Clinical Social Worker

## 2016-07-31 ENCOUNTER — Telehealth (HOSPITAL_COMMUNITY): Payer: Self-pay | Admitting: Psychiatry

## 2016-08-04 ENCOUNTER — Ambulatory Visit (HOSPITAL_COMMUNITY): Payer: Self-pay | Admitting: Psychiatry

## 2016-08-04 ENCOUNTER — Ambulatory Visit (HOSPITAL_COMMUNITY): Payer: Self-pay | Admitting: Licensed Clinical Social Worker

## 2016-08-06 ENCOUNTER — Ambulatory Visit (HOSPITAL_COMMUNITY): Payer: Self-pay | Admitting: Licensed Clinical Social Worker

## 2016-08-11 ENCOUNTER — Ambulatory Visit (HOSPITAL_COMMUNITY): Payer: Self-pay | Admitting: Licensed Clinical Social Worker

## 2016-08-13 ENCOUNTER — Ambulatory Visit (HOSPITAL_COMMUNITY): Payer: Self-pay | Admitting: Licensed Clinical Social Worker

## 2016-08-18 ENCOUNTER — Ambulatory Visit (HOSPITAL_COMMUNITY): Payer: Self-pay | Admitting: Licensed Clinical Social Worker

## 2016-08-20 ENCOUNTER — Ambulatory Visit (HOSPITAL_COMMUNITY): Payer: Self-pay | Admitting: Licensed Clinical Social Worker

## 2016-08-25 ENCOUNTER — Ambulatory Visit (HOSPITAL_COMMUNITY): Payer: Self-pay | Admitting: Licensed Clinical Social Worker

## 2016-08-25 ENCOUNTER — Other Ambulatory Visit (HOSPITAL_COMMUNITY): Payer: Self-pay

## 2016-08-25 DIAGNOSIS — F331 Major depressive disorder, recurrent, moderate: Secondary | ICD-10-CM

## 2016-08-25 MED ORDER — RISPERIDONE 2 MG PO TABS: 2.0000 mg | ORAL_TABLET | Freq: Every day | ORAL | 0 refills | Status: DC

## 2016-08-25 MED ORDER — DULOXETINE HCL 60 MG PO CPEP: 60.0000 mg | ORAL_CAPSULE | Freq: Every day | ORAL | 0 refills | Status: DC

## 2016-08-26 ENCOUNTER — Ambulatory Visit (HOSPITAL_COMMUNITY): Payer: Self-pay | Admitting: Psychiatry

## 2016-08-27 ENCOUNTER — Ambulatory Visit (INDEPENDENT_AMBULATORY_CARE_PROVIDER_SITE_OTHER): Payer: Medicare Other | Admitting: Psychiatry

## 2016-08-27 ENCOUNTER — Ambulatory Visit (HOSPITAL_COMMUNITY): Payer: Self-pay | Admitting: Licensed Clinical Social Worker

## 2016-08-27 ENCOUNTER — Encounter (HOSPITAL_COMMUNITY): Payer: Self-pay | Admitting: Psychiatry

## 2016-08-27 DIAGNOSIS — Z888 Allergy status to other drugs, medicaments and biological substances status: Secondary | ICD-10-CM | POA: Diagnosis not present

## 2016-08-27 DIAGNOSIS — Z818 Family history of other mental and behavioral disorders: Secondary | ICD-10-CM | POA: Diagnosis not present

## 2016-08-27 DIAGNOSIS — Z79891 Long term (current) use of opiate analgesic: Secondary | ICD-10-CM

## 2016-08-27 DIAGNOSIS — F331 Major depressive disorder, recurrent, moderate: Secondary | ICD-10-CM

## 2016-08-27 DIAGNOSIS — Z79899 Other long term (current) drug therapy: Secondary | ICD-10-CM | POA: Diagnosis not present

## 2016-08-27 DIAGNOSIS — Z87891 Personal history of nicotine dependence: Secondary | ICD-10-CM

## 2016-08-27 DIAGNOSIS — Z91041 Radiographic dye allergy status: Secondary | ICD-10-CM | POA: Diagnosis not present

## 2016-08-27 DIAGNOSIS — Z91048 Other nonmedicinal substance allergy status: Secondary | ICD-10-CM | POA: Diagnosis not present

## 2016-08-27 DIAGNOSIS — Z915 Personal history of self-harm: Secondary | ICD-10-CM | POA: Diagnosis not present

## 2016-08-27 MED ORDER — GABAPENTIN 300 MG PO CAPS: 600.0000 mg | ORAL_CAPSULE | Freq: Three times a day (TID) | ORAL | 3 refills | Status: DC

## 2016-08-27 MED ORDER — DULOXETINE HCL 30 MG PO CPEP: 90.0000 mg | ORAL_CAPSULE | Freq: Every day | ORAL | 3 refills | Status: DC

## 2016-08-27 MED ORDER — RISPERIDONE 2 MG PO TABS: 2.0000 mg | ORAL_TABLET | Freq: Every day | ORAL | 3 refills | Status: DC

## 2016-08-27 MED ORDER — TEMAZEPAM 30 MG PO CAPS: 30.0000 mg | ORAL_CAPSULE | Freq: Every evening | ORAL | 1 refills | Status: DC

## 2016-08-27 NOTE — Progress Notes (Signed)
[facility] MD/PA/NP OP Progress Note  08/27/2016 2:18 PM [name]  MRN:  [id number]  Chief Complaint:  Subjective:  [name] is a 66 year old female who presents for follow-up of major depressive disorder. I spent time with the patient reviewing her past history, and aligning on her current and future goals. She reports that she continues to struggle with depressive symptoms, difficulty thinking of ways to spend her time, and continues to be ambivalent about life. She is thinking about potentially getting a dog or a puppy to raise. She continues to do volunteer work once a week at the [employer]. She continues to go to mental health groups on Saturdays and Tuesdays as part of the [facility] in [place]. She reports that she spends dinner about once a week with her ex-husband, as they remain on good terms with their friendship.  Regarding her medications, she feels that Cymbalta has helped partly but not fully. She feels that risperidone does help with her depressive symptoms, and her up-and-down mood. She has tolerated this well. She continues to take temazepam 30 mg at night for her sleep, and reports good effect. She feels the gabapentin does help with her pain, and she wishes to increase this as she has decreased and discontinued her opiate regimen. We discussed an increase of Cymbalta to 90 mg daily and she is on board with this titration and follow-up in 4 weeks. She denies any suicidal thoughts or plans.  Visit Diagnosis:     ICD-9-CM ICD-10-CM   1. Major depressive disorder, recurrent episode, moderate (HCC) 296.32 F33.1 DULoxetine (CYMBALTA) 30 MG capsule     gabapentin (NEURONTIN) 300 MG capsule     risperiDONE (RISPERDAL) 2 MG tablet   Past Psychiatric History: See intake H&P for full details. Reviewed, with no updates at this time.   Past Medical History:  Past Medical History:  Diagnosis Date  . Anemia    2016 on iron for a while   . Anxiety   . ARTHRITIS  10/25/2007  . Arthritis   . Blood transfusion without reported diagnosis   . BREAST CANCER, HX OF 08/03/2006  . Cancer ([place])    hx of breast ca x 2 on right   . DEPRESSION 08/03/2006  . Diabetes ([place])    prediabetic   . DIABETES MELLITUS, TYPE II 08/03/2006  . DYSPNEA ON EXERTION 11/21/2009   shortness of breath with exertion   . EPISTAXIS, RECURRENT 01/27/2008  . FIBROMYALGIA 01/13/2008  . GERD 04/30/2008  . HEPATITIS C 08/03/2006  . HIP PAIN, RIGHT 04/30/2007  . History of detached retina repair 02/12/2011  . HYPERTENSION 04/30/2008  . INSOMNIA-SLEEP DISORDER-UNSPEC 11/21/2009  . KNEE SPRAIN, ACUTE 01/27/2008  . LEG PAIN, LEFT 01/13/2008  . OSTEOPENIA 07/26/2007  . Persistent vomiting 08/17/2008  . Pneumonia    hx of   . Prediabetes     Past Surgical History:  Procedure Laterality Date  . ABDOMINAL HYSTERECTOMY    . BREAST LUMPECTOMY  2000   right with axillary LND  . CHOLECYSTECTOMY  06/05/2012   Procedure: LAPAROSCOPIC CHOLECYSTECTOMY;  Surgeon: Adin Hector, MD;  Location: [facility];  Service: General;  Laterality: N/A;  . LAPAROSCOPIC LYSIS OF ADHESIONS  06/05/2012   Procedure: LAPAROSCOPIC LYSIS OF ADHESIONS;  Surgeon: Adin Hector, MD;  Location: [facility];  Service: General;  Laterality: N/A;  . MASTECTOMY  2007   Dr. Margot Chimes  . s/p right broken leg with surgury as teen    . s/p  thyroid FNA neg.    . TONSILLECTOMY    . TOTAL HIP ARTHROPLASTY Right 01/25/2016   Procedure: RIGHT TOTAL HIP ARTHROPLASTY ANTERIOR APPROACH;  Surgeon: Mcarthur Rossetti, MD;  Location: [facility];  Service: Orthopedics;  Laterality: Right;  . TUBAL LIGATION      Family Psychiatric History: See intake H&P for full details. Reviewed, with no updates at this time.   Family History:  Family History  Problem Relation Age of Onset  . Heart disease Mother   . Cancer Sister     breast  . Depression Father   . Depression Brother     Social History:  Social History   Social History  . Marital status:  Divorced    Spouse name: N/A  . Number of children: N/A  . Years of education: N/A   Occupational History  . Medical Editor    Social History Main Topics  . Smoking status: Former Smoker    Packs/day: 4.00    Years: 40.00    Types: Cigarettes    Quit date: 06/30/2005  . Smokeless tobacco: Never Used     Comment: Pt smoked up to 4 ppd  . Alcohol use 0.0 oz/week     Comment: rare  . Drug use: No  . Sexual activity: Not Currently   Other Topics Concern  . None   Social History Narrative  . None    Allergies:  Allergies  Allergen Reactions  . Ace Inhibitors Cough  . Contrast Media [Iodinated Diagnostic Agents]     Hallucinations  . Iohexol      Desc: Pt has been pre medicated with prednisone before scans, pt is unsure of type of contrast she reacted to.   . Tape Rash    "Tears my skin all up"    Metabolic Disorder Labs: Lab Results  Component Value Date   HGBA1C 5.5 04/06/2016   MPG 111 04/06/2016   MPG 128 (H) 06/04/2012   No results found for: PROLACTIN Lab Results  Component Value Date   CHOL 175 08/10/2013   TRIG 250.0 (H) 08/10/2013   HDL 62.80 08/10/2013   CHOLHDL 3 08/10/2013   VLDL 50.0 (H) 08/10/2013   LDLCALC 58 02/10/2011   LDLCALC 66 11/21/2009     Current Medications: Current Outpatient Prescriptions  Medication Sig Dispense Refill  . benzonatate (TESSALON) 200 MG capsule Take 1 capsule (200 mg total) by mouth 3 (three) times daily as needed for cough.    . cholestyramine (QUESTRAN) 4 GM/DOSE powder Take 1 packet (4 g total) by mouth 2 (two) times daily with a meal. 378 g 11  . diphenhydrAMINE (BENADRYL) 25 MG tablet Take 25 mg by mouth every 6 (six) hours as needed.    . DULoxetine (CYMBALTA) 30 MG capsule Take 3 capsules (90 mg total) by mouth daily. Take 90 mg daily in the morning. 270 capsule 3  . feeding supplement, ENSURE ENLIVE, (ENSURE ENLIVE) LIQD Take 237 mLs by mouth daily.    . folic acid (FOLVITE) 1 MG tablet Take 1 tablet (1 mg  total) by mouth daily.    Marland Kitchen gabapentin (NEURONTIN) 300 MG capsule Take 2 capsules (600 mg total) by mouth 3 (three) times daily. Take 1-2 capsules 3 times daily 540 capsule 3  . ibuprofen in sodium chloride 0.9 % 250 mL Inject 800 mg into the vein once.    . iron polysaccharides (NIFEREX) 150 MG capsule Take 1 capsule (150 mg total) by mouth 2 (two) times daily. (Patient taking  differently: Take 90 mg by mouth daily. )    . losartan (COZAAR) 50 MG tablet Take 50 mg by mouth daily.    . Melatonin 10 MG TABS Take by mouth.    . methocarbamol (ROBAXIN) 500 MG tablet Take 500 mg by mouth 3 (three) times daily.    Marland Kitchen omeprazole (PRILOSEC) 20 MG capsule Take 2 capsules (40 mg total) by mouth daily. (Patient taking differently: Take 20 mg by mouth 2 (two) times daily. ) 180 capsule 3  . oxyCODONE (OXY IR/ROXICODONE) 5 MG immediate release tablet Take 1 tablet (5 mg total) by mouth every 12 (twelve) hours as needed for severe pain. 15 tablet 0  . risperiDONE (RISPERDAL) 2 MG tablet Take 1 tablet (2 mg total) by mouth daily. 90 tablet 3  . saccharomyces boulardii (FLORASTOR) 250 MG capsule Take 1 capsule (250 mg total) by mouth 2 (two) times daily.    . temazepam (RESTORIL) 30 MG capsule Take 1 capsule (30 mg total) by mouth Nightly. 90 capsule 1  . thiamine 100 MG tablet Take 1 tablet (100 mg total) by mouth daily.     No current facility-administered medications for this visit.     Neurologic: Headache: Negative Seizure: Negative Paresthesias: Negative  Musculoskeletal: Strength & Muscle Tone: within normal limits Gait & Station: normal Patient leans: N/A  Psychiatric Specialty Exam: Review of Systems  HENT: Negative.   Respiratory: Negative.   Cardiovascular: Negative.   Neurological: Negative.   Psychiatric/Behavioral: Positive for depression.    Blood pressure 124/68, pulse 90, height 5\' 1"  (1.549 m), weight 70.6 kg (155 lb 9.6 oz).Body mass index is 29.4 kg/m.  General Appearance:  Casual and Fairly Groomed  Eye Contact:  Good  Speech:  Clear and Coherent  Volume:  Normal  Mood:  Depressed and Dysphoric  Affect:  Depressed  Thought Process:  Coherent  Orientation:  Full (Time, Place, and Person)  Thought Content: Logical   Suicidal Thoughts:  No  Homicidal Thoughts:  No  Memory:  Immediate;   Fair  Judgement:  Fair  Insight:  Fair  Psychomotor Activity:  Normal  Concentration:  Concentration: Fair and Attention Span: Fair  Recall:  NA  Fund of Knowledge: Good  Language: Good  Akathisia:  Negative  Handed:  Right  AIMS (if indicated):  n/a  Assets:  Desire for Improvement Financial Resources/Insurance Housing Leisure Time Talents/Skills Transportation  ADL's:  Intact  Cognition: WNL  Sleep:  7-9 hours nightly     Treatment Plan Summary: [name] is a 66 year old female with history of major depressive disorder, who presents today for medication management follow-up. She has a history of prior suicide attempt by overdose in September 2017. She is engaged well in the IOP group, and reports an overall improvement of her mood symptoms.  She continues with some negative and pessimistic thought patterns. She continues to struggle with some anhedonia and depressive symptoms, and would benefit from and up titration of her Cymbalta. She denies any acute unsafe thoughts. She has some future goals, including increasing her social engagement, and is considering getting a new dog. She has a fairly limited social support system, but it has maintained consistency over the years. She is generally introverted, enjoys reading books, and articles, and enjoys volunteering at the [employer] weekly.   She continues to engage twice weekly and mental health groups in the community. We'll follow-up in 4 weeks  # MDD - Increase Cymbalta to 90 mg daily - Risperidone 2  mg qhs for lability - restoril 30 mg qhs for sleep; ultimately he had hoped to taper this down to 15 mg in  the coming months as her mood improves and eventually discontinue given her advanced age - Up titrate Neurontin to 300-600 mg 3 times a day for pain, patient has tapered off opiates - Follow-up in 4 weeks  Aundra Dubin, MD 08/27/2016, 2:18 PM

## 2016-09-01 ENCOUNTER — Ambulatory Visit (HOSPITAL_COMMUNITY): Payer: Self-pay | Admitting: Licensed Clinical Social Worker

## 2016-09-03 ENCOUNTER — Ambulatory Visit (INDEPENDENT_AMBULATORY_CARE_PROVIDER_SITE_OTHER): Payer: Medicare Other | Admitting: Specialist

## 2016-09-03 ENCOUNTER — Encounter (INDEPENDENT_AMBULATORY_CARE_PROVIDER_SITE_OTHER): Payer: Self-pay

## 2016-09-04 ENCOUNTER — Ambulatory Visit (HOSPITAL_COMMUNITY): Payer: Self-pay | Admitting: Psychiatry

## 2016-09-09 DIAGNOSIS — J209 Acute bronchitis, unspecified: Secondary | ICD-10-CM | POA: Diagnosis not present

## 2016-09-10 ENCOUNTER — Ambulatory Visit (HOSPITAL_COMMUNITY): Payer: Self-pay | Admitting: Psychiatry

## 2016-09-18 DIAGNOSIS — M4807 Spinal stenosis, lumbosacral region: Secondary | ICD-10-CM | POA: Diagnosis not present

## 2016-09-18 DIAGNOSIS — M47817 Spondylosis without myelopathy or radiculopathy, lumbosacral region: Secondary | ICD-10-CM | POA: Diagnosis not present

## 2016-09-18 DIAGNOSIS — Z79891 Long term (current) use of opiate analgesic: Secondary | ICD-10-CM | POA: Diagnosis not present

## 2016-09-18 DIAGNOSIS — F329 Major depressive disorder, single episode, unspecified: Secondary | ICD-10-CM | POA: Diagnosis not present

## 2016-09-18 DIAGNOSIS — G894 Chronic pain syndrome: Secondary | ICD-10-CM | POA: Diagnosis not present

## 2016-09-24 ENCOUNTER — Ambulatory Visit (INDEPENDENT_AMBULATORY_CARE_PROVIDER_SITE_OTHER): Payer: Medicare Other | Admitting: Psychiatry

## 2016-09-24 ENCOUNTER — Encounter (HOSPITAL_COMMUNITY): Payer: Self-pay | Admitting: Psychiatry

## 2016-09-24 VITALS — BP 126/80 | HR 120 | Ht 60.5 in | Wt 149.4 lb

## 2016-09-24 DIAGNOSIS — Z818 Family history of other mental and behavioral disorders: Secondary | ICD-10-CM | POA: Diagnosis not present

## 2016-09-24 DIAGNOSIS — Z79899 Other long term (current) drug therapy: Secondary | ICD-10-CM

## 2016-09-24 DIAGNOSIS — F331 Major depressive disorder, recurrent, moderate: Secondary | ICD-10-CM | POA: Diagnosis not present

## 2016-09-24 DIAGNOSIS — Z87891 Personal history of nicotine dependence: Secondary | ICD-10-CM

## 2016-09-24 DIAGNOSIS — F5104 Psychophysiologic insomnia: Secondary | ICD-10-CM

## 2016-09-24 MED ORDER — DULOXETINE HCL 30 MG PO CPEP: 30.0000 mg | ORAL_CAPSULE | Freq: Every day | ORAL | 3 refills | Status: DC

## 2016-09-24 MED ORDER — DULOXETINE HCL 60 MG PO CPEP: 60.0000 mg | ORAL_CAPSULE | Freq: Every day | ORAL | 3 refills | Status: DC

## 2016-09-24 NOTE — Progress Notes (Signed)
[facility] MD/PA/NP OP Progress Note  09/24/2016 3:02 PM [name]  MRN:  [id number]  Chief Complaint: med management, depression  Subjective:  [name] presents today for medication management follow-up. She reports that she has continued to attend her weekly group on Saturdays with [facility].  She reports that her mood has been a little bit better, less negative and pessimistic. She reports that she has forced herself to go out to do some social activities specifically she has been trying to set the goal for herself of going out at least once a day, to either get a cup of coffee, or get a book, or just take a brief walk. 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 She reports that she is sleeping well at night. She maintains a good appetite. Her energy is fair. Her motivation to go out and do things is fair but improving. She denies any suicidal thoughts. She has been thinking more about how she can feel connected to the world, and connected to others in her life. She continues to hang out with her ex-husband about once a week. She shares a thought she had recently about keeping her blinds in her curtains in her house open, so as to lead in the light, and to be just generally more inviting to her neighborhood. She has been doing this for the past couple weeks, and this seems to help her feel a little bit better and more connected to the world. I recommended that the patient also think about the group therapies offered here. She reports that she had previously done the 90 minute group with Beth, but for multiple reasons she decided it wasn't the right fit for her. I suggested she also check out some of the [email or web address] group therapies, and she try out a few different groups to see what would be the right fit for her. She reports that she has now gotten in with the pain doctor, and the increase in gabapentin,  along with being restarted on her opiates for pain, has been helpful.  Visit Diagnosis:    ICD-9-CM ICD-10-CM   1. Major depressive disorder, recurrent episode, moderate (HCC) 296.32 F33.1 DULoxetine (CYMBALTA) 60 MG capsule  2. 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 Reviewed, with no updates at this time.  Past Medical History:  Past Medical History:  Diagnosis Date  . Anemia    2016 on iron for a while   . Anxiety   . ARTHRITIS 10/25/2007  . Arthritis   . Blood transfusion without reported diagnosis   . BREAST CANCER, HX OF 08/03/2006  . Cancer ([place])    hx of breast ca x 2 on right   . DEPRESSION 08/03/2006  . Diabetes ([place])    prediabetic   . DIABETES MELLITUS, TYPE II 08/03/2006  . DYSPNEA ON EXERTION 11/21/2009   shortness of breath with exertion   . EPISTAXIS, RECURRENT 01/27/2008  . FIBROMYALGIA 01/13/2008  . GERD 04/30/2008  . HEPATITIS C 08/03/2006  . HIP PAIN, RIGHT 04/30/2007  . History of detached retina repair 02/12/2011  . HYPERTENSION 04/30/2008  . INSOMNIA-SLEEP DISORDER-UNSPEC 11/21/2009  . KNEE SPRAIN, ACUTE 01/27/2008  . LEG PAIN, LEFT 01/13/2008  . OSTEOPENIA 07/26/2007  . Persistent vomiting 08/17/2008  . Pneumonia    hx of   . Prediabetes  Past Surgical History:  Procedure Laterality Date  . ABDOMINAL HYSTERECTOMY    . BREAST LUMPECTOMY  2000   right with axillary LND  . CHOLECYSTECTOMY  06/05/2012   Procedure: LAPAROSCOPIC CHOLECYSTECTOMY;  Surgeon: Adin Hector, MD;  [facility];  Service: General;  Laterality: N/A;  . LAPAROSCOPIC LYSIS OF ADHESIONS  06/05/2012   Procedure: LAPAROSCOPIC LYSIS OF ADHESIONS;  Surgeon: Adin Hector, MD;  [facility];  Service: General;  Laterality: N/A;  . MASTECTOMY  2007   Dr. Margot Chimes  . s/p right broken leg with surgury as teen    . s/p thyroid FNA neg.    . TONSILLECTOMY    . TOTAL HIP ARTHROPLASTY Right 01/25/2016   Procedure: RIGHT TOTAL  HIP ARTHROPLASTY ANTERIOR APPROACH;  Surgeon: Mcarthur Rossetti, MD;  [facility];  Service: Orthopedics;  Laterality: Right;  . TUBAL LIGATION      Family Psychiatric History: See intake H&P for full details. Reviewed, with no updates at this time.   Family History:  Family History  Problem Relation Age of Onset  . Heart disease Mother   . Cancer Sister     breast  . Depression Father   . Depression Brother     Social History:  Social History   Social History  . Marital status: Divorced    Spouse name: N/A  . Number of children: N/A  . Years of education: N/A   Occupational History  . Medical Editor    Social History Main Topics  . Smoking status: Former Smoker    Packs/day: 4.00    Years: 40.00    Types: Cigarettes    Quit date: 06/30/2005  . Smokeless tobacco: Never Used     Comment: Pt smoked up to 4 ppd  . Alcohol use 0.0 oz/week     Comment: rare  . Drug use: No  . Sexual activity: Not Currently   Other Topics Concern  . None   Social History Narrative  . None    Allergies:  Allergies  Allergen Reactions  . Ace Inhibitors Cough  . Contrast Media [Iodinated Diagnostic Agents]     Hallucinations  . Iohexol      Desc: Pt has been pre medicated with prednisone before scans, pt is unsure of type of contrast she reacted to.   . Tape Rash    "Tears my skin all up"    Metabolic Disorder Labs: Lab Results  Component Value Date   HGBA1C 5.5 04/06/2016   MPG 111 04/06/2016   MPG 128 (H) 06/04/2012   No results found for: PROLACTIN Lab Results  Component Value Date   CHOL 175 08/10/2013   TRIG 250.0 (H) 08/10/2013   HDL 62.80 08/10/2013   CHOLHDL 3 08/10/2013   VLDL 50.0 (H) 08/10/2013   LDLCALC 58 02/10/2011   LDLCALC 66 11/21/2009     Current Medications: Current Outpatient Prescriptions  Medication Sig Dispense Refill  . cholestyramine (QUESTRAN) 4 GM/DOSE powder Take 1 packet (4 g total) by mouth 2 (two) times daily with a  meal. 378 g 11  . diphenhydrAMINE (BENADRYL) 25 MG tablet Take 25 mg by mouth every 6 (six) hours as needed.    . DULoxetine (CYMBALTA) 30 MG capsule Take 1 capsule (30 mg total) by mouth daily. Take with 60 mg capsule for a total of 90 mg daily 90 capsule 3  . DULoxetine (CYMBALTA) 60 MG capsule Take 1 capsule (60 mg total) by mouth daily. Take with 30  mg capsule for a total of 90 mg daily 90 capsule 3  . feeding supplement, ENSURE ENLIVE, (ENSURE ENLIVE) LIQD Take 237 mLs by mouth daily.    Marland Kitchen gabapentin (NEURONTIN) 300 MG capsule Take 2 capsules (600 mg total) by mouth 3 (three) times daily. Take 1-2 capsules 3 times daily 540 capsule 3  . HYDROcodone-acetaminophen (NORCO/VICODIN) 5-325 MG tablet Take 5-325 tablets by mouth 3 (three) times daily.    . methocarbamol (ROBAXIN) 500 MG tablet Take 500 mg by mouth 3 (three) times daily.    Marland Kitchen omeprazole (PRILOSEC) 20 MG capsule Take 2 capsules (40 mg total) by mouth daily. (Patient taking differently: Take 20 mg by mouth 2 (two) times daily. ) 180 capsule 3  . risperiDONE (RISPERDAL) 2 MG tablet Take 1 tablet (2 mg total) by mouth daily. 90 tablet 3  . saccharomyces boulardii (FLORASTOR) 250 MG capsule Take 1 capsule (250 mg total) by mouth 2 (two) times daily.    . temazepam (RESTORIL) 30 MG capsule Take 1 capsule (30 mg total) by mouth Nightly. 90 capsule 1  . thiamine 100 MG tablet Take 1 tablet (100 mg total) by mouth daily.     No current facility-administered medications for this visit.     Neurologic: Headache: Negative Seizure: Negative Paresthesias: Negative  Musculoskeletal: Strength & Muscle Tone: within normal limits Gait & Station: normal Patient leans: N/A  Psychiatric Specialty Exam: Review of Systems  HENT: Negative.   Respiratory: Negative.   Cardiovascular: Negative.   Neurological: Negative.   Psychiatric/Behavioral: Positive for depression.    Blood pressure 126/80, pulse (!) 120, height 5' 0.5" (1.537 m), weight  149 lb 6.4 oz (67.8 kg).Body mass index is 28.7 kg/m.  General Appearance: Casual and Fairly Groomed  Eye Contact:  Good  Speech:  Clear and Coherent  Volume:  Normal  Mood:  Dysphoric and less depressed than last visit  Affect:  Congruent and slighlty improved  Thought Process:  Coherent  Orientation:  Full (Time, Place, and Person)  Thought Content: Logical   Suicidal Thoughts:  No  Homicidal Thoughts:  No  Memory:  Immediate;   Fair  Judgement:  Fair  Insight:  Fair  Psychomotor Activity:  Normal  Concentration:  Concentration: Fair and Attention Span: Fair  Recall:  NA  Fund of Knowledge: Good  Language: Good  Akathisia:  Negative  Handed:  Right  AIMS (if indicated):  n/a  Assets:  Desire for Improvement Financial Resources/Insurance Housing Leisure Time Talents/Skills Transportation  ADL's:  Intact  Cognition: WNL  Sleep:  7-9 hours nightly   Treatment Plan Summary: ETTAMAE BARKETT is a 66 year old female with history of major depressive disorder, who presents today for medication management follow-up. She has a history of prior suicide attempt by overdose in September 2017. This is in the setting of significant depression and social isolation.  She engaged well in the IOP group, and reports an overall improvement of her mood symptoms since then.  She continues to attend weekly mental health groups in the community. We recently increased Cymbalta for some of her depressive symptoms, with good results.  She is trying to think of new ways to avoid isolating herself, and feeling more connected with the world.  She seems to be trending towards general improvement, and denies any acute safety issues at this time. Given that we recently increased Cymbalta about 4 weeks ago, we will give it some time to have further benefit, and follow-up next month for a check-in.  1. Major depressive disorder, recurrent episode, moderate ([place])   2. Psychophysiological insomnia    - Cymbalta 90  mg daily (60 mg + 30 mg) - Risperidone 2 mg qhs for lability - Restoril 30 mg qhs for sleep; goal to taper in the coming months - Neurontin 600 mg 3 times a day for pain - Follow-up in 4 weeks  Aundra Dubin, MD 09/24/2016, 3:02 PM

## 2016-10-23 DIAGNOSIS — M47817 Spondylosis without myelopathy or radiculopathy, lumbosacral region: Secondary | ICD-10-CM | POA: Diagnosis not present

## 2016-10-23 DIAGNOSIS — G894 Chronic pain syndrome: Secondary | ICD-10-CM | POA: Diagnosis not present

## 2016-10-23 DIAGNOSIS — M4807 Spinal stenosis, lumbosacral region: Secondary | ICD-10-CM | POA: Diagnosis not present

## 2016-10-23 DIAGNOSIS — F329 Major depressive disorder, single episode, unspecified: Secondary | ICD-10-CM | POA: Diagnosis not present

## 2016-10-28 ENCOUNTER — Ambulatory Visit (INDEPENDENT_AMBULATORY_CARE_PROVIDER_SITE_OTHER): Payer: Medicare Other | Admitting: Psychiatry

## 2016-10-28 VITALS — BP 116/76 | HR 104 | Ht 60.5 in | Wt 155.8 lb

## 2016-10-28 DIAGNOSIS — Z818 Family history of other mental and behavioral disorders: Secondary | ICD-10-CM | POA: Diagnosis not present

## 2016-10-28 DIAGNOSIS — Z87891 Personal history of nicotine dependence: Secondary | ICD-10-CM | POA: Diagnosis not present

## 2016-10-28 DIAGNOSIS — Z79899 Other long term (current) drug therapy: Secondary | ICD-10-CM

## 2016-10-28 DIAGNOSIS — F5104 Psychophysiologic insomnia: Secondary | ICD-10-CM | POA: Diagnosis not present

## 2016-10-28 DIAGNOSIS — F331 Major depressive disorder, recurrent, moderate: Secondary | ICD-10-CM

## 2016-10-28 NOTE — Progress Notes (Signed)
[facility] MD/PA/NP OP Progress Note  10/28/2016 1:49 PM [name]  MRN:  [id number]  Chief Complaint: follow-up med check  Subjective:  [name] presents today for follow-up after recent increase in Cymbalta. She reports that she feels like this has helped substantially. She is able to describe some of the things she's been doing to help with her mood and energy. She reports that she has opened her curtains and is letting the sunlight into the home, and she feels that this is also helped. She is notably more optimistic and positive during her interaction. She reports that her most significant issue at this time is that she is having trouble finding a book to read but grabs her attention. She is going to be going to [employer] in the bookstore to look over some new novels. She continues to participate in the mental health [facility] meetings on Saturdays, but may end up taking time to attend the [place] groups. She denies any suicidal thoughts. Feels like her journey and improvement has been trending in the right direction. She would like to remain on the current medication regimen. I spent time with the patient learning about some of the social activities she has been considering, and she is also considering getting a part-time job out of the home. I pointed out to her that she is remarkably more optimistic, and she was able to accept and internalize this complement.  Visit Diagnosis:    ICD-9-CM ICD-10-CM   1. Major depressive disorder, recurrent episode, moderate (HCC) 296.32 F33.1   2. Psychophysiological insomnia 307.42 F51.04      Past Psychiatric History: Reviewed, with no updates at this time.  Past Medical History:  Past Medical History:  Diagnosis Date  . Anemia    2016 on iron for a while   . Anxiety   . ARTHRITIS 10/25/2007  . Arthritis   . Blood transfusion without reported diagnosis   . BREAST CANCER, HX OF 08/03/2006  . Cancer ([place])    hx of breast ca x 2 on right    . DEPRESSION 08/03/2006  . Diabetes ([place])    prediabetic   . DIABETES MELLITUS, TYPE II 08/03/2006  . DYSPNEA ON EXERTION 11/21/2009   shortness of breath with exertion   . EPISTAXIS, RECURRENT 01/27/2008  . FIBROMYALGIA 01/13/2008  . GERD 04/30/2008  . HEPATITIS C 08/03/2006  . HIP PAIN, RIGHT 04/30/2007  . History of detached retina repair 02/12/2011  . HYPERTENSION 04/30/2008  . INSOMNIA-SLEEP DISORDER-UNSPEC 11/21/2009  . KNEE SPRAIN, ACUTE 01/27/2008  . LEG PAIN, LEFT 01/13/2008  . OSTEOPENIA 07/26/2007  . Persistent vomiting 08/17/2008  . Pneumonia    hx of   . Prediabetes     Past Surgical History:  Procedure Laterality Date  . ABDOMINAL HYSTERECTOMY    . BREAST LUMPECTOMY  2000   right with axillary LND  . CHOLECYSTECTOMY  06/05/2012   Procedure: LAPAROSCOPIC CHOLECYSTECTOMY;  Surgeon: Adin Hector, MD;  Location: [facility];  Service: General;  Laterality: N/A;  . LAPAROSCOPIC LYSIS OF ADHESIONS  06/05/2012   Procedure: LAPAROSCOPIC LYSIS OF ADHESIONS;  Surgeon: Adin Hector, MD;  Location: [facility];  Service: General;  Laterality: N/A;  . MASTECTOMY  2007   Dr. Margot Chimes  . s/p right broken leg with surgury as teen    . s/p thyroid FNA neg.    . TONSILLECTOMY    . TOTAL HIP ARTHROPLASTY Right 01/25/2016   Procedure: RIGHT TOTAL HIP ARTHROPLASTY ANTERIOR  APPROACH;  Surgeon: Mcarthur Rossetti, MD;  Location: [facility];  Service: Orthopedics;  Laterality: Right;  . TUBAL LIGATION      Family Psychiatric History: Reviewed, with no updates at this time.   Family History:  Family History  Problem Relation Age of Onset  . Heart disease Mother   . Cancer Sister     breast  . Depression Father   . Depression Brother     Social History:  Social History   Social History  . Marital status: Divorced    Spouse name: N/A  . Number of children: N/A  . Years of education: N/A   Occupational History  . Medical Editor    Social History Main Topics  . Smoking status: Former  Smoker    Packs/day: 4.00    Years: 40.00    Types: Cigarettes    Quit date: 06/30/2005  . Smokeless tobacco: Never Used     Comment: Pt smoked up to 4 ppd  . Alcohol use 0.0 oz/week     Comment: rare  . Drug use: No  . Sexual activity: Not Currently   Other Topics Concern  . Not on file   Social History Narrative  . No narrative on file    Allergies:  Allergies  Allergen Reactions  . Ace Inhibitors Cough  . Contrast Media [Iodinated Diagnostic Agents]     Hallucinations  . Iohexol      Desc: Pt has been pre medicated with prednisone before scans, pt is unsure of type of contrast she reacted to.   . Tape Rash    "Tears my skin all up"    Metabolic Disorder Labs: Lab Results  Component Value Date   HGBA1C 5.5 04/06/2016   MPG 111 04/06/2016   MPG 128 (H) 06/04/2012   No results found for: PROLACTIN Lab Results  Component Value Date   CHOL 175 08/10/2013   TRIG 250.0 (H) 08/10/2013   HDL 62.80 08/10/2013   CHOLHDL 3 08/10/2013   VLDL 50.0 (H) 08/10/2013   LDLCALC 58 02/10/2011   LDLCALC 66 11/21/2009     Current Medications: Current Outpatient Prescriptions  Medication Sig Dispense Refill  . cholestyramine (QUESTRAN) 4 GM/DOSE powder Take 1 packet (4 g total) by mouth 2 (two) times daily with a meal. 378 g 11  . diphenhydrAMINE (BENADRYL) 25 MG tablet Take 25 mg by mouth every 6 (six) hours as needed.    . DULoxetine (CYMBALTA) 30 MG capsule Take 1 capsule (30 mg total) by mouth daily. Take with 60 mg capsule for a total of 90 mg daily 90 capsule 3  . DULoxetine (CYMBALTA) 60 MG capsule Take 1 capsule (60 mg total) by mouth daily. Take with 30 mg capsule for a total of 90 mg daily 90 capsule 3  . feeding supplement, ENSURE ENLIVE, (ENSURE ENLIVE) LIQD Take 237 mLs by mouth daily.    Marland Kitchen gabapentin (NEURONTIN) 300 MG capsule Take 2 capsules (600 mg total) by mouth 3 (three) times daily. Take 1-2 capsules 3 times daily 540 capsule 3  . HYDROcodone-acetaminophen  (NORCO/VICODIN) 5-325 MG tablet Take 5-325 tablets by mouth 3 (three) times daily.    . methocarbamol (ROBAXIN) 500 MG tablet Take 500 mg by mouth 3 (three) times daily.    Marland Kitchen omeprazole (PRILOSEC) 20 MG capsule Take 2 capsules (40 mg total) by mouth daily. (Patient taking differently: Take 20 mg by mouth 2 (two) times daily. ) 180 capsule 3  . risperiDONE (RISPERDAL) 2 MG tablet  Take 1 tablet (2 mg total) by mouth daily. 90 tablet 3  . saccharomyces boulardii (FLORASTOR) 250 MG capsule Take 1 capsule (250 mg total) by mouth 2 (two) times daily.    . temazepam (RESTORIL) 30 MG capsule Take 1 capsule (30 mg total) by mouth Nightly. 90 capsule 1  . thiamine 100 MG tablet Take 1 tablet (100 mg total) by mouth daily.     No current facility-administered medications for this visit.     Neurologic: Headache: Negative Seizure: Negative Paresthesias: Negative  Musculoskeletal: Strength & Muscle Tone: within normal limits Gait & Station: normal Patient leans: N/A  Psychiatric Specialty Exam: Review of Systems  HENT: Negative.   Respiratory: Negative.   Cardiovascular: Negative.   Neurological: Negative.   Psychiatric/Behavioral: Positive for depression.    Blood pressure 116/76, pulse (!) 104, height 5' 0.5" (1.537 m), weight 155 lb 12.8 oz (70.7 kg).Body mass index is 29.93 kg/m.  General Appearance: Casual and Fairly Groomed  Eye Contact:  Good  Speech:  Clear and Coherent and Normal Rate  Volume:  Normal  Mood:  Euthymic  Affect:  Congruent and optimistic  Thought Process:  Coherent  Orientation:  Full (Time, Place, and Person)  Thought Content: Logical   Suicidal Thoughts:  No  Homicidal Thoughts:  No  Memory:  Immediate;   Fair  Judgement:  Good  Insight:  Fair  Psychomotor Activity:  Normal  Concentration:  Concentration: Fair and Attention Span: Fair  Recall:  NA  Fund of Knowledge: Good  Language: Good  Akathisia:  Negative  Handed:  Right  AIMS (if indicated):  0   Assets:  Desire for Improvement Financial Resources/Insurance Housing Leisure Time Talents/Skills Transportation  ADL's:  Intact  Cognition: WNL  Sleep:  7-9 hours nightly   Treatment Plan Summary: [name] is a 66 year old female with history of major depressive disorder, who presents today for medication management follow-up. She has a history of prior suicide attempt by overdose in September 2017.  She has had an improvement in her mood symptoms from our last visit, particularly with the increase in Cymbalta to 30 mg. She has good support from her sister, continues to engage in hobbies such as reading, gardening, and garage sale hunting. She is more optimistic over the past few weeks, and presents today with some interval improvements in her depressive symptoms. No acute safety issues at this time, and we will continue to follow up every month.  1. Major depressive disorder, recurrent episode, moderate ([place])   2. Psychophysiological insomnia    - Cymbalta 90 mg daily (60 mg + 30 mg) - Risperidone 2 mg qhs for lability - Restoril 30 mg qhs for sleep; goal to taper in the coming months - Neurontin 600 mg 3 times a day for pain - Follow-up in 4-6 weeks  Aundra Dubin, MD 10/28/2016, 1:49 PM

## 2016-11-20 DIAGNOSIS — F329 Major depressive disorder, single episode, unspecified: Secondary | ICD-10-CM | POA: Diagnosis not present

## 2016-11-20 DIAGNOSIS — G894 Chronic pain syndrome: Secondary | ICD-10-CM | POA: Diagnosis not present

## 2016-11-20 DIAGNOSIS — M47817 Spondylosis without myelopathy or radiculopathy, lumbosacral region: Secondary | ICD-10-CM | POA: Diagnosis not present

## 2016-11-20 DIAGNOSIS — M4807 Spinal stenosis, lumbosacral region: Secondary | ICD-10-CM | POA: Diagnosis not present

## 2016-12-10 ENCOUNTER — Ambulatory Visit (HOSPITAL_COMMUNITY): Payer: Self-pay | Admitting: Psychiatry

## 2017-01-14 DIAGNOSIS — M4807 Spinal stenosis, lumbosacral region: Secondary | ICD-10-CM | POA: Diagnosis not present

## 2017-01-14 DIAGNOSIS — G894 Chronic pain syndrome: Secondary | ICD-10-CM | POA: Diagnosis not present

## 2017-01-14 DIAGNOSIS — M47817 Spondylosis without myelopathy or radiculopathy, lumbosacral region: Secondary | ICD-10-CM | POA: Diagnosis not present

## 2017-01-14 DIAGNOSIS — F329 Major depressive disorder, single episode, unspecified: Secondary | ICD-10-CM | POA: Diagnosis not present

## 2017-02-04 ENCOUNTER — Ambulatory Visit (INDEPENDENT_AMBULATORY_CARE_PROVIDER_SITE_OTHER): Payer: Medicare Other | Admitting: Psychiatry

## 2017-02-04 ENCOUNTER — Encounter (HOSPITAL_COMMUNITY): Payer: Self-pay | Admitting: Psychiatry

## 2017-02-04 VITALS — BP 127/82 | HR 101 | Ht 60.0 in | Wt 159.4 lb

## 2017-02-04 DIAGNOSIS — F5104 Psychophysiologic insomnia: Secondary | ICD-10-CM

## 2017-02-04 DIAGNOSIS — F331 Major depressive disorder, recurrent, moderate: Secondary | ICD-10-CM

## 2017-02-04 DIAGNOSIS — Z560 Unemployment, unspecified: Secondary | ICD-10-CM | POA: Diagnosis not present

## 2017-02-04 DIAGNOSIS — Z87891 Personal history of nicotine dependence: Secondary | ICD-10-CM | POA: Diagnosis not present

## 2017-02-04 DIAGNOSIS — Z818 Family history of other mental and behavioral disorders: Secondary | ICD-10-CM | POA: Diagnosis not present

## 2017-02-04 MED ORDER — AMITRIPTYLINE HCL 10 MG PO TABS: 10.0000 mg | ORAL_TABLET | Freq: Every day | ORAL | 1 refills | Status: DC

## 2017-02-04 MED ORDER — TEMAZEPAM 15 MG PO CAPS: 15.0000 mg | ORAL_CAPSULE | Freq: Every evening | ORAL | 1 refills | Status: DC

## 2017-02-04 MED ORDER — GABAPENTIN 300 MG PO CAPS: 600.0000 mg | ORAL_CAPSULE | Freq: Four times a day (QID) | ORAL | 3 refills | Status: DC

## 2017-02-04 NOTE — Progress Notes (Signed)
[facility] MD/PA/NP OP Progress Note  02/04/2017 2:08 PM [name]  MRN:  [id number]  Chief Complaint:  med management  Subjective:  [name] reports things have been okay. She reports that the temazepam has lost some of its effect in terms of sleep. She is interested in trying Elavil, I educated her on tricyclic antidepressants in the risks and benefits, potential for interaction with Cymbalta, and to be cautious of serotonin syndrome, we reviewed the signs and symptoms of that. We agreed to decrease Restoril to 15 mg, and she can gradually increase Elavil as tolerated to 20 mg, and eventually discontinue Restoril.  She reports that her sister was on Elavil for sleep and this was quite effective for her.  She reports that her mood has been okay. She is in the process of changing jobs. 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 She reports that it's a bit challenging with the landscape of hiring process today and what she has been used to in the past. She reports that she is also trying to find some book clubs and other hobbies and activities so that she can feel connected with others. We spent some time processing some of her grief related to her sense of loneliness. She admits that her loneliness is partly self-imposed, and she is more comfortable as an introvert, but she knows that it's healthy for her to socialize and connect with others.  Visit Diagnosis:    ICD-10-CM   1. Major depressive disorder, recurrent episode, moderate (HCC) F33.1 gabapentin (NEURONTIN) 300 MG capsule    temazepam (RESTORIL) 15 MG capsule    amitriptyline (ELAVIL) 10 MG tablet  2. Psychophysiological insomnia F51.04 temazepam (RESTORIL) 15 MG capsule    amitriptyline (ELAVIL) 10 MG tablet     Past Psychiatric History: See intake H&P for full details. Reviewed, with no updates at this time.  Past Medical History:  Past Medical History:  Diagnosis Date  . Anemia     2016 on iron for a while   . Anxiety   . ARTHRITIS 10/25/2007  . Arthritis   . Blood transfusion without reported diagnosis   . BREAST CANCER, HX OF 08/03/2006  . Cancer ([place])    hx of breast ca x 2 on right   . DEPRESSION 08/03/2006  . Diabetes ([place])    prediabetic   . DIABETES MELLITUS, TYPE II 08/03/2006  . DYSPNEA ON EXERTION 11/21/2009   shortness of breath with exertion   . EPISTAXIS, RECURRENT 01/27/2008  . FIBROMYALGIA 01/13/2008  . GERD 04/30/2008  . HEPATITIS C 08/03/2006  . HIP PAIN, RIGHT 04/30/2007  . History of detached retina repair 02/12/2011  . HYPERTENSION 04/30/2008  . INSOMNIA-SLEEP DISORDER-UNSPEC 11/21/2009  . KNEE SPRAIN, ACUTE 01/27/2008  . LEG PAIN, LEFT 01/13/2008  . OSTEOPENIA 07/26/2007  . Persistent vomiting 08/17/2008  . Pneumonia    hx of   . Prediabetes     Past Surgical History:  Procedure Laterality Date  . ABDOMINAL HYSTERECTOMY    . BREAST LUMPECTOMY  2000   right with axillary LND  . CHOLECYSTECTOMY  06/05/2012   Procedure: LAPAROSCOPIC CHOLECYSTECTOMY;  Surgeon: Adin Hector, MD;  Location: [facility];  Service: General;  Laterality: N/A;  . LAPAROSCOPIC LYSIS OF ADHESIONS  06/05/2012   Procedure: LAPAROSCOPIC LYSIS OF ADHESIONS;  Surgeon: Adin Hector, MD;  Location: [facility];  Service: General;  Laterality: N/A;  . MASTECTOMY  2007  Dr. Margot Chimes  . s/p right broken leg with surgury as teen    . s/p thyroid FNA neg.    . TONSILLECTOMY    . TOTAL HIP ARTHROPLASTY Right 01/25/2016   Procedure: RIGHT TOTAL HIP ARTHROPLASTY ANTERIOR APPROACH;  Surgeon: Mcarthur Rossetti, MD;  Location: [facility];  Service: Orthopedics;  Laterality: Right;  . TUBAL LIGATION      Family Psychiatric History: See intake H&P for full details. Reviewed, with no updates at this time.  Family History:  Family History  Problem Relation Age of Onset  . Heart disease Mother   . Cancer Sister        breast  . Depression Father   . Depression Brother     Social  History:  Social History   Social History  . Marital status: Divorced    Spouse name: N/A  . Number of children: N/A  . Years of education: N/A   Occupational History  . Medical Editor    Social History Main Topics  . Smoking status: Former Smoker    Packs/day: 4.00    Years: 40.00    Types: Cigarettes    Quit date: 06/30/2005  . Smokeless tobacco: Never Used     Comment: Pt smoked up to 4 ppd  . Alcohol use 0.0 oz/week     Comment: rare  . Drug use: No  . Sexual activity: Not Currently   Other Topics Concern  . None   Social History Narrative  . None    Allergies:  Allergies  Allergen Reactions  . Ace Inhibitors Cough  . Contrast Media [Iodinated Diagnostic Agents]     Hallucinations  . Iohexol      Desc: Pt has been pre medicated with prednisone before scans, pt is unsure of type of contrast she reacted to.   . Tape Rash    "Tears my skin all up"    Metabolic Disorder Labs: Lab Results  Component Value Date   HGBA1C 5.5 04/06/2016   MPG 111 04/06/2016   MPG 128 (H) 06/04/2012   No results found for: PROLACTIN Lab Results  Component Value Date   CHOL 175 08/10/2013   TRIG 250.0 (H) 08/10/2013   HDL 62.80 08/10/2013   CHOLHDL 3 08/10/2013   VLDL 50.0 (H) 08/10/2013   LDLCALC 58 02/10/2011   LDLCALC 66 11/21/2009     Current Medications: Current Outpatient Prescriptions  Medication Sig Dispense Refill  . amitriptyline (ELAVIL) 10 MG tablet Take 1 tablet (10 mg total) by mouth at bedtime. 90 tablet 1  . cholestyramine (QUESTRAN) 4 GM/DOSE powder Take 1 packet (4 g total) by mouth 2 (two) times daily with a meal. 378 g 11  . diphenhydrAMINE (BENADRYL) 25 MG tablet Take 25 mg by mouth every 6 (six) hours as needed.    . DULoxetine (CYMBALTA) 30 MG capsule Take 1 capsule (30 mg total) by mouth daily. Take with 60 mg capsule for a total of 90 mg daily 90 capsule 3  . DULoxetine (CYMBALTA) 60 MG capsule Take 1 capsule (60 mg total) by mouth daily. Take  with 30 mg capsule for a total of 90 mg daily 90 capsule 3  . feeding supplement, ENSURE ENLIVE, (ENSURE ENLIVE) LIQD Take 237 mLs by mouth daily.    Marland Kitchen gabapentin (NEURONTIN) 300 MG capsule Take 2 capsules (600 mg total) by mouth 4 (four) times daily. Take 1-2 capsules 3 times daily 720 capsule 3  . HYDROcodone-acetaminophen (NORCO/VICODIN) 5-325 MG tablet Take 5-325 tablets  by mouth 3 (three) times daily.    . methocarbamol (ROBAXIN) 500 MG tablet Take 500 mg by mouth 3 (three) times daily.    Marland Kitchen omeprazole (PRILOSEC) 20 MG capsule Take 2 capsules (40 mg total) by mouth daily. (Patient taking differently: Take 20 mg by mouth 2 (two) times daily. ) 180 capsule 3  . risperiDONE (RISPERDAL) 2 MG tablet Take 1 tablet (2 mg total) by mouth daily. 90 tablet 3  . saccharomyces boulardii (FLORASTOR) 250 MG capsule Take 1 capsule (250 mg total) by mouth 2 (two) times daily.    . temazepam (RESTORIL) 15 MG capsule Take 1 capsule (15 mg total) by mouth Nightly. 90 capsule 1  . thiamine 100 MG tablet Take 1 tablet (100 mg total) by mouth daily.     No current facility-administered medications for this visit.     Neurologic: Headache: Negative Seizure: Negative Paresthesias: Negative  Musculoskeletal: Strength & Muscle Tone: within normal limits Gait & Station: normal Patient leans: N/A  Psychiatric Specialty Exam: Review of Systems  HENT: Negative.   Respiratory: Negative.   Cardiovascular: Negative.   Neurological: Negative.   Psychiatric/Behavioral: Positive for depression.    Blood pressure 127/82, pulse (!) 101, height 5' (1.524 m), weight 159 lb 6.4 oz (72.3 kg), SpO2 (!) 89 %.Body mass index is 31.13 kg/m.  General Appearance: Casual and Fairly Groomed  Eye Contact:  Good  Speech:  Clear and Coherent and Normal Rate  Volume:  Normal  Mood:  Euthymic  Affect:  Congruent and optimistic  Thought Process:  Coherent  Orientation:  Full (Time, Place, and Person)  Thought Content:  Logical   Suicidal Thoughts:  No  Homicidal Thoughts:  No  Memory:  Immediate;   Fair  Judgement:  Good  Insight:  Fair  Psychomotor Activity:  Normal  Concentration:  Concentration: Fair and Attention Span: Fair  Recall:  NA  Fund of Knowledge: Good  Language: Good  Akathisia:  Negative  Handed:  Right  AIMS (if indicated):  0  Assets:  Desire for Improvement Financial Resources/Insurance Housing Leisure Time Talents/Skills Transportation  ADL's:  Intact  Cognition: WNL  Sleep:  7-9 hours nightly   Treatment Plan Summary: [name] is a 66 year old female with history of major depressive disorder, who presents today for medication management follow-up.  Restoril has lost some of its efficacy for her insomnia symptoms. I suspect some of this is due to increased anxiety and mood symptoms with her recent job change. We agreed to start Elavil nightly for sleep, and decrease her dose of Restoril to 15 mg nightly she does not present any acute safety issues and will follow-up with this writer in 3 months.  1. Major depressive disorder, recurrent episode, moderate ([place])   2. Psychophysiological insomnia      - Cymbalta 90 mg daily (60 mg + 30 mg) - Risperidone 2 mg qhs for lability - Restoril 15 mg qhs for sleep; goal to taper off to discontinue - Initiate Elavil 10 mg nightly for pain and sleep; increase to 20 mg as tolerated - Neurontin 600 mg 4 times a day for pain - Follow-up in 12 weeks  Aundra Dubin, MD 02/04/2017, 2:08 PM

## 2017-02-06 ENCOUNTER — Other Ambulatory Visit (HOSPITAL_COMMUNITY): Payer: Self-pay

## 2017-02-06 DIAGNOSIS — F331 Major depressive disorder, recurrent, moderate: Secondary | ICD-10-CM

## 2017-02-06 MED ORDER — GABAPENTIN 300 MG PO CAPS: 600.0000 mg | ORAL_CAPSULE | Freq: Four times a day (QID) | ORAL | 3 refills | Status: DC

## 2017-02-26 DIAGNOSIS — F329 Major depressive disorder, single episode, unspecified: Secondary | ICD-10-CM | POA: Diagnosis not present

## 2017-02-26 DIAGNOSIS — G894 Chronic pain syndrome: Secondary | ICD-10-CM | POA: Diagnosis not present

## 2017-02-26 DIAGNOSIS — M4807 Spinal stenosis, lumbosacral region: Secondary | ICD-10-CM | POA: Diagnosis not present

## 2017-02-26 DIAGNOSIS — M47817 Spondylosis without myelopathy or radiculopathy, lumbosacral region: Secondary | ICD-10-CM | POA: Diagnosis not present

## 2017-03-31 ENCOUNTER — Telehealth (HOSPITAL_COMMUNITY): Payer: Self-pay

## 2017-03-31 DIAGNOSIS — F331 Major depressive disorder, recurrent, moderate: Secondary | ICD-10-CM

## 2017-03-31 DIAGNOSIS — F5104 Psychophysiologic insomnia: Secondary | ICD-10-CM

## 2017-03-31 DIAGNOSIS — M47817 Spondylosis without myelopathy or radiculopathy, lumbosacral region: Secondary | ICD-10-CM | POA: Diagnosis not present

## 2017-03-31 DIAGNOSIS — M4807 Spinal stenosis, lumbosacral region: Secondary | ICD-10-CM | POA: Diagnosis not present

## 2017-03-31 DIAGNOSIS — F329 Major depressive disorder, single episode, unspecified: Secondary | ICD-10-CM | POA: Diagnosis not present

## 2017-03-31 DIAGNOSIS — G894 Chronic pain syndrome: Secondary | ICD-10-CM | POA: Diagnosis not present

## 2017-03-31 MED ORDER — AMITRIPTYLINE HCL 10 MG PO TABS
20.0000 mg | ORAL_TABLET | Freq: Every day | ORAL | 0 refills | Status: DC
Start: 2017-03-31 — End: 2017-04-30

## 2017-03-31 NOTE — Telephone Encounter (Signed)
Patient is calling to say she is having a hard time sleeping. She is wanting to go back to 30 mg Temazepam and go up on her Amitriptyline to 20 mg. Please review and advise, thank you

## 2017-03-31 NOTE — Telephone Encounter (Signed)
Lets just initially start with increasing elavil to 20 mg and see how this goes.

## 2017-03-31 NOTE — Telephone Encounter (Signed)
Increased the Elavil and called patient to let her know.

## 2017-04-14 DIAGNOSIS — I10 Essential (primary) hypertension: Secondary | ICD-10-CM | POA: Diagnosis not present

## 2017-04-14 DIAGNOSIS — E119 Type 2 diabetes mellitus without complications: Secondary | ICD-10-CM | POA: Diagnosis not present

## 2017-04-14 DIAGNOSIS — Z23 Encounter for immunization: Secondary | ICD-10-CM | POA: Diagnosis not present

## 2017-04-14 DIAGNOSIS — Z0000 Encounter for general adult medical examination without abnormal findings: Secondary | ICD-10-CM | POA: Diagnosis not present

## 2017-04-14 DIAGNOSIS — M8580 Other specified disorders of bone density and structure, unspecified site: Secondary | ICD-10-CM | POA: Diagnosis not present

## 2017-04-14 DIAGNOSIS — E559 Vitamin D deficiency, unspecified: Secondary | ICD-10-CM | POA: Diagnosis not present

## 2017-04-17 DIAGNOSIS — D649 Anemia, unspecified: Secondary | ICD-10-CM | POA: Diagnosis not present

## 2017-04-24 DIAGNOSIS — E119 Type 2 diabetes mellitus without complications: Secondary | ICD-10-CM | POA: Diagnosis not present

## 2017-04-24 DIAGNOSIS — K219 Gastro-esophageal reflux disease without esophagitis: Secondary | ICD-10-CM | POA: Diagnosis not present

## 2017-04-24 DIAGNOSIS — M545 Low back pain: Secondary | ICD-10-CM | POA: Diagnosis not present

## 2017-04-24 DIAGNOSIS — M4807 Spinal stenosis, lumbosacral region: Secondary | ICD-10-CM | POA: Diagnosis not present

## 2017-04-24 DIAGNOSIS — K449 Diaphragmatic hernia without obstruction or gangrene: Secondary | ICD-10-CM | POA: Diagnosis not present

## 2017-04-24 DIAGNOSIS — Z23 Encounter for immunization: Secondary | ICD-10-CM | POA: Diagnosis not present

## 2017-04-24 DIAGNOSIS — Z6831 Body mass index (BMI) 31.0-31.9, adult: Secondary | ICD-10-CM | POA: Diagnosis not present

## 2017-04-24 DIAGNOSIS — M2550 Pain in unspecified joint: Secondary | ICD-10-CM | POA: Diagnosis not present

## 2017-04-24 DIAGNOSIS — B1920 Unspecified viral hepatitis C without hepatic coma: Secondary | ICD-10-CM | POA: Diagnosis not present

## 2017-04-24 DIAGNOSIS — D509 Iron deficiency anemia, unspecified: Secondary | ICD-10-CM | POA: Diagnosis not present

## 2017-04-24 DIAGNOSIS — R197 Diarrhea, unspecified: Secondary | ICD-10-CM | POA: Diagnosis not present

## 2017-04-24 DIAGNOSIS — M797 Fibromyalgia: Secondary | ICD-10-CM | POA: Diagnosis not present

## 2017-04-30 ENCOUNTER — Ambulatory Visit (INDEPENDENT_AMBULATORY_CARE_PROVIDER_SITE_OTHER): Payer: Medicare Other | Admitting: Psychiatry

## 2017-04-30 ENCOUNTER — Encounter (HOSPITAL_COMMUNITY): Payer: Self-pay | Admitting: Psychiatry

## 2017-04-30 DIAGNOSIS — G894 Chronic pain syndrome: Secondary | ICD-10-CM | POA: Diagnosis not present

## 2017-04-30 DIAGNOSIS — F331 Major depressive disorder, recurrent, moderate: Secondary | ICD-10-CM

## 2017-04-30 DIAGNOSIS — F5104 Psychophysiologic insomnia: Secondary | ICD-10-CM

## 2017-04-30 DIAGNOSIS — M47817 Spondylosis without myelopathy or radiculopathy, lumbosacral region: Secondary | ICD-10-CM | POA: Diagnosis not present

## 2017-04-30 DIAGNOSIS — M4807 Spinal stenosis, lumbosacral region: Secondary | ICD-10-CM | POA: Diagnosis not present

## 2017-04-30 DIAGNOSIS — Z87891 Personal history of nicotine dependence: Secondary | ICD-10-CM

## 2017-04-30 DIAGNOSIS — F329 Major depressive disorder, single episode, unspecified: Secondary | ICD-10-CM | POA: Diagnosis not present

## 2017-04-30 MED ORDER — TEMAZEPAM 30 MG PO CAPS: 30.0000 mg | ORAL_CAPSULE | Freq: Every evening | ORAL | 1 refills | Status: DC

## 2017-04-30 MED ORDER — DULOXETINE HCL 60 MG PO CPEP: 60.0000 mg | ORAL_CAPSULE | Freq: Every day | ORAL | 3 refills | Status: DC

## 2017-04-30 MED ORDER — LAMOTRIGINE 25 MG PO TABS: 25.0000 mg | ORAL_TABLET | Freq: Every day | ORAL | 0 refills | Status: DC

## 2017-04-30 MED ORDER — LAMOTRIGINE 100 MG PO TABS: 100.0000 mg | ORAL_TABLET | Freq: Every day | ORAL | 1 refills | Status: DC

## 2017-04-30 MED ORDER — AMITRIPTYLINE HCL 25 MG PO TABS: 25.0000 mg | ORAL_TABLET | Freq: Every day | ORAL | 1 refills | Status: DC

## 2017-04-30 MED ORDER — RISPERIDONE 1 MG PO TABS: 1.0000 mg | ORAL_TABLET | Freq: Every day | ORAL | 0 refills | Status: DC

## 2017-04-30 MED ORDER — DULOXETINE HCL 30 MG PO CPEP: 30.0000 mg | ORAL_CAPSULE | Freq: Every day | ORAL | 3 refills | Status: DC

## 2017-04-30 NOTE — Patient Instructions (Signed)
Take Lamictal 25 mg tablet daily times 1 week, then increase to 2 tablets (50 mg) daily times 1 week  After 2 weeks, pick up the Lamictal 100 mg tablet and start taking that daily  Once you are taking 100 mg Lamictal, go ahead and decrease risperidone to 1 tablet (1 mg) nightly for 1 week, then decrease to 1/2 tablet for 1 week, then discontinue  Continue Cymbalta 90 mg daily  Stop the Elavil 10 mg tablets  Start Elavil 25 mg tablet at night  Stop Restoril 15 mg  Start Restoril 30 mg at night

## 2017-04-30 NOTE — Progress Notes (Signed)
[facility] MD/PA/NP OP Progress Note  04/30/2017 2:05 PM [name]  MRN:  [id number]  Chief Complaint: Continued difficulty with sleep HPI: [name] presents today for medication management follow-up.  She has noticed ongoing increase in weight and appetite increase, and wonders what this is from.  She reports that she continues to have difficulty with sleeping, and feels that the Restoril and Elavil is a good combination, but she feels like she needs a dose increase in both.  I spent time with the patient weighing the risks and benefits of benzodiazepines and tricyclic antidepressants in individuals over 60, and expressing my concerns about falls, cognitive deficits secondary to benzodiazepines.  We also spent time discussing the risk of antipsychotic, specifically prolonged exposure to risperidone, and the risk of metabolic effects and tardive dyskinesia.  Ultimately, we both agree that we would like to see her off of risperidone, but we would need to introduce an effective mood stabilizer to help with her up-and-down moods and irritability..  She reports that her mood and depression symptoms have been stable for the most part.  She is not currently working, so this would be an okay time to make a change.  We discussed the Lamictal and the risk of Stevens-Johnson syndrome rash ascribed to her what this might look like and instructions to discontinue medication and potentially seek emergency care.  We discussed a titration regimen of Lamictal and a taper regimen of risperidone.  We also discussed increasing Restoril and Elavil at night to help with sleep, but ultimately a long-term goal will be to simplify her nightly medication regimen, and potentially use Elavil monotherapy.  Spent time discussing some of her ongoing activities, periodically spending time with her ex-husband who continues to be a good friend, spending time with her sister.  She is a voracious reader, and reports that she tends to  keep herself occupied with reading.  She denies any acute safety issues and agrees to follow-up first of the year.  Visit Diagnosis:    ICD-10-CM   1. Major depressive disorder, recurrent episode, moderate (HCC) F33.1 temazepam (RESTORIL) 30 MG capsule    amitriptyline (ELAVIL) 25 MG tablet    DULoxetine (CYMBALTA) 60 MG capsule    DULoxetine (CYMBALTA) 30 MG capsule    risperiDONE (RISPERDAL) 1 MG tablet    lamoTRIgine (LAMICTAL) 25 MG tablet    lamoTRIgine (LAMICTAL) 100 MG tablet  2. Psychophysiological insomnia F51.04 temazepam (RESTORIL) 30 MG capsule    amitriptyline (ELAVIL) 25 MG tablet    DULoxetine (CYMBALTA) 30 MG capsule    lamoTRIgine (LAMICTAL) 25 MG tablet    lamoTRIgine (LAMICTAL) 100 MG tablet    Past Psychiatric History: See intake H&P for full details. Reviewed, with no updates at this time.   Past Medical History:  Past Medical History:  Diagnosis Date  . Anemia    2016 on iron for a while   . Anxiety   . ARTHRITIS 10/25/2007  . Arthritis   . Blood transfusion without reported diagnosis   . BREAST CANCER, HX OF 08/03/2006  . Cancer ([place])    hx of breast ca x 2 on right   . DEPRESSION 08/03/2006  . Diabetes ([place])    prediabetic   . DIABETES MELLITUS, TYPE II 08/03/2006  . DYSPNEA ON EXERTION 11/21/2009   shortness of breath with exertion   . EPISTAXIS, RECURRENT 01/27/2008  . FIBROMYALGIA 01/13/2008  . GERD 04/30/2008  . HEPATITIS C 08/03/2006  . HIP PAIN, RIGHT 04/30/2007  .  History of detached retina repair 02/12/2011  . HYPERTENSION 04/30/2008  . INSOMNIA-SLEEP DISORDER-UNSPEC 11/21/2009  . KNEE SPRAIN, ACUTE 01/27/2008  . LEG PAIN, LEFT 01/13/2008  . OSTEOPENIA 07/26/2007  . Persistent vomiting 08/17/2008  . Pneumonia    hx of   . Prediabetes     Past Surgical History:  Procedure Laterality Date  . ABDOMINAL HYSTERECTOMY    . BREAST LUMPECTOMY  2000   right with axillary LND  . CHOLECYSTECTOMY  06/05/2012   Procedure: LAPAROSCOPIC CHOLECYSTECTOMY;   Surgeon: Adin Hector, MD;  Location: [facility];  Service: General;  Laterality: N/A;  . LAPAROSCOPIC LYSIS OF ADHESIONS  06/05/2012   Procedure: LAPAROSCOPIC LYSIS OF ADHESIONS;  Surgeon: Adin Hector, MD;  Location: [facility];  Service: General;  Laterality: N/A;  . MASTECTOMY  2007   Dr. Margot Chimes  . s/p right broken leg with surgury as teen    . s/p thyroid FNA neg.    . TONSILLECTOMY    . TOTAL HIP ARTHROPLASTY Right 01/25/2016   Procedure: RIGHT TOTAL HIP ARTHROPLASTY ANTERIOR APPROACH;  Surgeon: Mcarthur Rossetti, MD;  Location: [facility];  Service: Orthopedics;  Laterality: Right;  . TUBAL LIGATION      Family Psychiatric History: See intake H&P for full details. Reviewed, with no updates at this time.   Family History:  Family History  Problem Relation Age of Onset  . Heart disease Mother   . Cancer Sister        breast  . Depression Father   . Depression Brother     Social History:  Social History   Social History  . Marital status: Divorced    Spouse name: N/A  . Number of children: N/A  . Years of education: N/A   Occupational History  . Medical Editor    Social History Main Topics  . Smoking status: Former Smoker    Packs/day: 4.00    Years: 40.00    Types: Cigarettes    Quit date: 06/30/2005  . Smokeless tobacco: Never Used     Comment: Pt smoked up to 4 ppd  . Alcohol use 0.0 oz/week     Comment: rare  . Drug use: No  . Sexual activity: Not Currently   Other Topics Concern  . None   Social History Narrative  . None    Allergies:  Allergies  Allergen Reactions  . Ace Inhibitors Cough  . Contrast Media [Iodinated Diagnostic Agents]     Hallucinations  . Iohexol      Desc: Pt has been pre medicated with prednisone before scans, pt is unsure of type of contrast she reacted to.   . Tape Rash    "Tears my skin all up"    Metabolic Disorder Labs: Lab Results  Component Value Date   HGBA1C 5.5 04/06/2016   MPG 111 04/06/2016   MPG 128 (H)  06/04/2012   No results found for: PROLACTIN Lab Results  Component Value Date   CHOL 175 08/10/2013   TRIG 250.0 (H) 08/10/2013   HDL 62.80 08/10/2013   CHOLHDL 3 08/10/2013   VLDL 50.0 (H) 08/10/2013   LDLCALC 58 02/10/2011   LDLCALC 66 11/21/2009   Lab Results  Component Value Date   TSH 3.68 08/10/2013   TSH 3.75 02/10/2011    Therapeutic Level Labs: No results found for: LITHIUM No results found for: VALPROATE No components found for:  CBMZ  Current Medications: Current Outpatient Prescriptions  Medication Sig Dispense Refill  . amitriptyline (  ELAVIL) 25 MG tablet Take 1 tablet (25 mg total) by mouth at bedtime. 90 tablet 1  . cholestyramine (QUESTRAN) 4 GM/DOSE powder Take 1 packet (4 g total) by mouth 2 (two) times daily with a meal. 378 g 11  . diphenhydrAMINE (BENADRYL) 25 MG tablet Take 25 mg by mouth every 6 (six) hours as needed.    . DULoxetine (CYMBALTA) 30 MG capsule Take 1 capsule (30 mg total) by mouth daily. Take with 60 mg capsule for a total of 90 mg daily 90 capsule 3  . DULoxetine (CYMBALTA) 60 MG capsule Take 1 capsule (60 mg total) by mouth daily. Take with 30 mg capsule for a total of 90 mg daily 90 capsule 3  . feeding supplement, ENSURE ENLIVE, (ENSURE ENLIVE) LIQD Take 237 mLs by mouth daily.    Marland Kitchen gabapentin (NEURONTIN) 300 MG capsule Take 2 capsules (600 mg total) by mouth 4 (four) times daily. 720 capsule 3  . HYDROcodone-acetaminophen (NORCO/VICODIN) 5-325 MG tablet Take 5-325 tablets by mouth 3 (three) times daily.    . methocarbamol (ROBAXIN) 500 MG tablet Take 500 mg by mouth 3 (three) times daily.    Marland Kitchen omeprazole (PRILOSEC) 20 MG capsule Take 2 capsules (40 mg total) by mouth daily. (Patient taking differently: Take 20 mg by mouth 2 (two) times daily. ) 180 capsule 3  . saccharomyces boulardii (FLORASTOR) 250 MG capsule Take 1 capsule (250 mg total) by mouth 2 (two) times daily.    . temazepam (RESTORIL) 30 MG capsule Take 1 capsule (30 mg  total) by mouth Nightly. 90 capsule 1  . thiamine 100 MG tablet Take 1 tablet (100 mg total) by mouth daily.    Derrill Memo ON 05/14/2017] lamoTRIgine (LAMICTAL) 100 MG tablet Take 1 tablet (100 mg total) by mouth daily. 90 tablet 1  . lamoTRIgine (LAMICTAL) 25 MG tablet Take 1 tablet (25 mg total) by mouth daily. 25 mg daily x 1 week, then 2 tablets daily x 1 week, then switch to 100 mg tablet daily 30 tablet 0  . risperiDONE (RISPERDAL) 1 MG tablet Take 1 tablet (1 mg total) by mouth daily. Take 1 tablet nightly for 1 week, then 1/2 tablet nightly for 1 week, then stop 30 tablet 0   No current facility-administered medications for this visit.      Musculoskeletal: Strength & Muscle Tone: within normal limits Gait & Station: normal Patient leans: N/A  Psychiatric Specialty Exam: ROS  Blood pressure 122/80, pulse (!) 103, height 5' 1.5" (1.562 m), weight 168 lb 9.6 oz (76.5 kg).Body mass index is 31.34 kg/m.  General Appearance: Casual and Well Groomed  Eye Contact:  Good  Speech:  Clear and Coherent  Volume:  Normal  Mood:  Euthymic  Affect:  Appropriate and Congruent  Thought Process:  Goal Directed and Descriptions of Associations: Intact  Orientation:  Full (Time, Place, and Person)  Thought Content: Logical   Suicidal Thoughts:  No  Homicidal Thoughts:  No  Memory:  Immediate;   Good  Judgement:  Good  Insight:  Good  Psychomotor Activity:  Normal  Concentration:  Attention Span: Good  Recall:  Good  Fund of Knowledge: Good  Language: Good  Akathisia:  Negative  Handed:  Right  AIMS (if indicated): not done  Assets:  Communication Skills Desire for Improvement Financial Resources/Insurance Housing  ADL's:  Intact  Cognition: WNL  Sleep:  Fair   Screenings: PHQ2-9     Office Visit from 08/10/2013 in [facility]  PHQ-2 Total Score  1       Assessment and Plan:  [name] is a 66 year old female with chronic major depressive  disorder, with accompanying mood lability and periodic agitation.  She is also suffered with significant insomnia which has been challenging to treat at times, but does tend to do fair better with Elavil in combination with Restoril.  She does not present any suicidality or acute safety issues at this time.  She has a consistent support system from her sister and her ex-husband, and tends to make multiple acquaintances in her community and Through her church.  Her mood symptoms have remained fairly stable, with insomnia being an ongoing challenge.  We agreed to make medication changes as below to address her insomnia symptoms.  Specifically we also agreed to begin initiating Lamictal for mood stabilizer such that we can taper and ultimately discontinue risperidone given the significant deleterious effects, both related to metabolic changes and risk of tardive dyskinesia.  We will proceed as below and follow-up in 10-12 weeks.  1. Major depressive disorder, recurrent episode, moderate ([place])   2. Psychophysiological insomnia     Status of current problems: stable  Labs Ordered: No orders of the defined types were placed in this encounter.   Labs Reviewed: n/a  Collateral Obtained/Records Reviewed: n/a  Plan:  Take Lamictal 25 mg tablet daily times 1 week, then increase to 2 tablets (50 mg) daily times 1 week After 2 weeks, pick up the Lamictal 100 mg tablet and start taking that daily  Once you are taking 100 mg Lamictal, go ahead and decrease risperidone to 1 tablet (1 mg) nightly for 1 week, then decrease to 1/2 tablet for 1 week, then discontinue  Continue Cymbalta 90 mg daily  Stop the Elavil 10 mg tablets Start Elavil 25 mg tablet at night  Stop Restoril 15 mg Start Restoril 30 mg at night  I spent 30 minutes with the patient in direct face-to-face clinical care.  Greater than 50% of this time was spent in counseling and coordination of care with the patient.    Aundra Dubin, MD 04/30/2017, 2:05 PM

## 2017-05-11 ENCOUNTER — Telehealth: Payer: Self-pay | Admitting: Acute Care

## 2017-05-11 ENCOUNTER — Telehealth (HOSPITAL_COMMUNITY): Payer: Self-pay

## 2017-05-11 DIAGNOSIS — Z122 Encounter for screening for malignant neoplasm of respiratory organs: Secondary | ICD-10-CM

## 2017-05-11 DIAGNOSIS — Z87891 Personal history of nicotine dependence: Secondary | ICD-10-CM

## 2017-05-11 NOTE — Telephone Encounter (Signed)
Patient is calling because she can not find her prescription of Temazepam, she does not think she ever got it, but I show it being printed on her last visit. Please review and advise, thank you

## 2017-05-11 NOTE — Telephone Encounter (Signed)
Called medication into the pharmacy and let patient know.

## 2017-05-11 NOTE — Telephone Encounter (Signed)
Okay to call into pharmacy and let them know patient lost the script

## 2017-05-12 NOTE — Telephone Encounter (Signed)
Spoke with pt and scheduled SDMV 05/18/17.   CT ordered Nothing further needed

## 2017-05-13 ENCOUNTER — Encounter: Payer: Self-pay | Admitting: Hematology and Oncology

## 2017-05-13 DIAGNOSIS — Z1212 Encounter for screening for malignant neoplasm of rectum: Secondary | ICD-10-CM | POA: Diagnosis not present

## 2017-05-18 ENCOUNTER — Encounter: Payer: Self-pay | Admitting: Acute Care

## 2017-05-18 ENCOUNTER — Inpatient Hospital Stay: Admission: RE | Admit: 2017-05-18 | Payer: Self-pay | Source: Ambulatory Visit

## 2017-05-28 ENCOUNTER — Telehealth: Payer: Self-pay | Admitting: Acute Care

## 2017-05-29 NOTE — Telephone Encounter (Signed)
ATC pt, no answer. Left message for pt to call back.  

## 2017-06-01 ENCOUNTER — Inpatient Hospital Stay: Admission: RE | Admit: 2017-06-01 | Payer: Self-pay | Source: Ambulatory Visit

## 2017-06-01 ENCOUNTER — Encounter: Payer: Self-pay | Admitting: Acute Care

## 2017-06-01 NOTE — Telephone Encounter (Signed)
Pt has appt for [facility] and CT today at 2:30 and 3:45.  I left detailed message for pt that Medicare does cover the SDMV and Ct.   Nothing further needed.

## 2017-06-02 ENCOUNTER — Telehealth: Payer: Self-pay | Admitting: Hematology and Oncology

## 2017-06-02 DIAGNOSIS — M47817 Spondylosis without myelopathy or radiculopathy, lumbosacral region: Secondary | ICD-10-CM | POA: Diagnosis not present

## 2017-06-02 DIAGNOSIS — F329 Major depressive disorder, single episode, unspecified: Secondary | ICD-10-CM | POA: Diagnosis not present

## 2017-06-02 DIAGNOSIS — M4807 Spinal stenosis, lumbosacral region: Secondary | ICD-10-CM | POA: Diagnosis not present

## 2017-06-02 DIAGNOSIS — G894 Chronic pain syndrome: Secondary | ICD-10-CM | POA: Diagnosis not present

## 2017-06-02 DIAGNOSIS — Z79891 Long term (current) use of opiate analgesic: Secondary | ICD-10-CM | POA: Diagnosis not present

## 2017-06-02 NOTE — Telephone Encounter (Signed)
Pt cld to reschedule appt to see Dr. Lebron Conners on 07/16/17 at 2pm. I offered the pt an earlier appt but declined and chose to keep the January appt.

## 2017-06-03 ENCOUNTER — Encounter: Payer: Self-pay | Admitting: Hematology and Oncology

## 2017-07-16 ENCOUNTER — Encounter: Payer: Self-pay | Admitting: Hematology and Oncology

## 2017-07-27 ENCOUNTER — Ambulatory Visit (INDEPENDENT_AMBULATORY_CARE_PROVIDER_SITE_OTHER): Payer: PPO | Admitting: Psychiatry

## 2017-07-27 ENCOUNTER — Encounter (HOSPITAL_COMMUNITY): Payer: Self-pay | Admitting: Psychiatry

## 2017-07-27 DIAGNOSIS — F5104 Psychophysiologic insomnia: Secondary | ICD-10-CM | POA: Diagnosis not present

## 2017-07-27 DIAGNOSIS — Z79899 Other long term (current) drug therapy: Secondary | ICD-10-CM | POA: Diagnosis not present

## 2017-07-27 DIAGNOSIS — F331 Major depressive disorder, recurrent, moderate: Secondary | ICD-10-CM | POA: Diagnosis not present

## 2017-07-27 DIAGNOSIS — Z818 Family history of other mental and behavioral disorders: Secondary | ICD-10-CM

## 2017-07-27 DIAGNOSIS — M4807 Spinal stenosis, lumbosacral region: Secondary | ICD-10-CM | POA: Diagnosis not present

## 2017-07-27 DIAGNOSIS — G894 Chronic pain syndrome: Secondary | ICD-10-CM | POA: Diagnosis not present

## 2017-07-27 DIAGNOSIS — M47817 Spondylosis without myelopathy or radiculopathy, lumbosacral region: Secondary | ICD-10-CM | POA: Diagnosis not present

## 2017-07-27 DIAGNOSIS — F329 Major depressive disorder, single episode, unspecified: Secondary | ICD-10-CM | POA: Diagnosis not present

## 2017-07-27 MED ORDER — AMITRIPTYLINE HCL 25 MG PO TABS: 25.0000 mg | ORAL_TABLET | Freq: Every day | ORAL | 1 refills | Status: DC

## 2017-07-27 MED ORDER — DULOXETINE HCL 60 MG PO CPEP: 60.0000 mg | ORAL_CAPSULE | Freq: Every day | ORAL | 3 refills | Status: DC

## 2017-07-27 MED ORDER — GABAPENTIN 300 MG PO CAPS: 600.0000 mg | ORAL_CAPSULE | Freq: Four times a day (QID) | ORAL | 3 refills | Status: DC

## 2017-07-27 MED ORDER — DULOXETINE HCL 30 MG PO CPEP: 30.0000 mg | ORAL_CAPSULE | Freq: Every day | ORAL | 3 refills | Status: DC

## 2017-07-27 MED ORDER — LAMOTRIGINE 200 MG PO TABS: 200.0000 mg | ORAL_TABLET | Freq: Every day | ORAL | 1 refills | Status: DC

## 2017-07-27 MED ORDER — TEMAZEPAM 30 MG PO CAPS: 30.0000 mg | ORAL_CAPSULE | Freq: Every evening | ORAL | 1 refills | Status: DC

## 2017-07-27 MED ORDER — ARIPIPRAZOLE 2 MG PO TABS: 2.0000 mg | ORAL_TABLET | Freq: Every day | ORAL | 1 refills | Status: DC

## 2017-07-27 NOTE — Progress Notes (Signed)
[place] MD/PA/NP OP Progress Note  07/27/2017 3:50 PM [name]  MRN:  [id number]  Chief Complaint:  med check  HPI: Patient reports that her mood has been a bit more depressed.  She is not sure if this is related to switching from risperidone to Lamictal.  She feels mostly that her life is just very isolated, and she does not have enough socialization.  This continues to be the same complaint as previous sessions, and I spent time with her considering how she can actively make changes and be more open to participating in social activities.  She continues to be very closed off about the types of social activities that she is willing to consider.  I spent time with her discussing limitations of medications, but we agreed to increase Lamictal to 200 mg daily for a therapeutic dose.  We also agreed to start a low-dose of Abilify given that her depression continues to be subjectively quite severe as far as she can ascertain.  She denies any acute suicidal thoughts, but continues to think about death and dying, and suggests that in 4-5 years she might take her life if she continues to feel debilitated in this way, and her physical health continues to decline.  She reports that she does not want to have a poor quality of life and be physically unable to do the things that make her happy.  Visit Diagnosis:    ICD-10-CM   1. Major depressive disorder, recurrent episode, moderate (HCC) F33.1 lamoTRIgine (LAMICTAL) 200 MG tablet    gabapentin (NEURONTIN) 300 MG capsule    DULoxetine (CYMBALTA) 30 MG capsule    DULoxetine (CYMBALTA) 60 MG capsule    amitriptyline (ELAVIL) 25 MG tablet    temazepam (RESTORIL) 30 MG capsule  2. Psychophysiological insomnia F51.04 lamoTRIgine (LAMICTAL) 200 MG tablet    DULoxetine (CYMBALTA) 30 MG capsule    amitriptyline (ELAVIL) 25 MG tablet    temazepam (RESTORIL) 30 MG capsule    Past Psychiatric History: See intake H&P for full details. Reviewed, with no updates at this  time.   Past Medical History:  Past Medical History:  Diagnosis Date  . Anemia    2016 on iron for a while   . Anxiety   . ARTHRITIS 10/25/2007  . Arthritis   . Blood transfusion without reported diagnosis   . BREAST CANCER, HX OF 08/03/2006  . Cancer ([place])    hx of breast ca x 2 on right   . DEPRESSION 08/03/2006  . Diabetes ([place])    prediabetic   . DIABETES MELLITUS, TYPE II 08/03/2006  . DYSPNEA ON EXERTION 11/21/2009   shortness of breath with exertion   . EPISTAXIS, RECURRENT 01/27/2008  . FIBROMYALGIA 01/13/2008  . GERD 04/30/2008  . HEPATITIS C 08/03/2006  . HIP PAIN, RIGHT 04/30/2007  . History of detached retina repair 02/12/2011  . HYPERTENSION 04/30/2008  . INSOMNIA-SLEEP DISORDER-UNSPEC 11/21/2009  . KNEE SPRAIN, ACUTE 01/27/2008  . LEG PAIN, LEFT 01/13/2008  . OSTEOPENIA 07/26/2007  . Persistent vomiting 08/17/2008  . Pneumonia    hx of   . Prediabetes     Past Surgical History:  Procedure Laterality Date  . ABDOMINAL HYSTERECTOMY    . BREAST LUMPECTOMY  2000   right with axillary LND  . CHOLECYSTECTOMY  06/05/2012   Procedure: LAPAROSCOPIC CHOLECYSTECTOMY;  Surgeon: Adin Hector, MD;  Location: [facility];  Service: General;  Laterality: N/A;  . LAPAROSCOPIC LYSIS OF ADHESIONS  06/05/2012   Procedure: LAPAROSCOPIC  LYSIS OF ADHESIONS;  Surgeon: Adin Hector, MD;  Location: [facility];  Service: General;  Laterality: N/A;  . MASTECTOMY  2007   Dr. Margot Chimes  . s/p right broken leg with surgury as teen    . s/p thyroid FNA neg.    . TONSILLECTOMY    . TOTAL HIP ARTHROPLASTY Right 01/25/2016   Procedure: RIGHT TOTAL HIP ARTHROPLASTY ANTERIOR APPROACH;  Surgeon: Mcarthur Rossetti, MD;  Location: [facility];  Service: Orthopedics;  Laterality: Right;  . TUBAL LIGATION      Family Psychiatric History: See intake H&P for full details. Reviewed, with no updates at this time.   Family History:  Family History  Problem Relation Age of Onset  . Heart disease Mother   . Cancer  Sister        breast  . Depression Father   . Depression Brother     Social History:  Social History   Socioeconomic History  . Marital status: Divorced    Spouse name: None  . Number of children: None  . Years of education: None  . Highest education level: None  Social Needs  . Financial resource strain: None  . Food insecurity - worry: None  . Food insecurity - inability: None  . Transportation needs - medical: None  . Transportation needs - non-medical: None  Occupational History  . Occupation: Sports coach  Tobacco Use  . Smoking status: Former Smoker    Packs/day: 4.00    Years: 40.00    Pack years: 160.00    Types: Cigarettes    Last attempt to quit: 06/30/2005    Years since quitting: 12.0  . Smokeless tobacco: Never Used  . Tobacco comment: Pt smoked up to 4 ppd  Substance and Sexual Activity  . Alcohol use: Yes    Alcohol/week: 0.0 oz    Comment: rare  . Drug use: No  . Sexual activity: Not Currently  Other Topics Concern  . None  Social History Narrative  . None    Allergies:  Allergies  Allergen Reactions  . Ace Inhibitors Cough  . Contrast Media [Iodinated Diagnostic Agents]     Hallucinations  . Iohexol      Desc: Pt has been pre medicated with prednisone before scans, pt is unsure of type of contrast she reacted to.   . Tape Rash    "Tears my skin all up"    Metabolic Disorder Labs: Lab Results  Component Value Date   HGBA1C 5.5 04/06/2016   MPG 111 04/06/2016   MPG 128 (H) 06/04/2012   No results found for: PROLACTIN Lab Results  Component Value Date   CHOL 175 08/10/2013   TRIG 250.0 (H) 08/10/2013   HDL 62.80 08/10/2013   CHOLHDL 3 08/10/2013   VLDL 50.0 (H) 08/10/2013   LDLCALC 58 02/10/2011   LDLCALC 66 11/21/2009   Lab Results  Component Value Date   TSH 3.68 08/10/2013   TSH 3.75 02/10/2011    Therapeutic Level Labs: No results found for: LITHIUM No results found for: VALPROATE No components found for:   CBMZ  Current Medications: Current Outpatient Medications  Medication Sig Dispense Refill  . amitriptyline (ELAVIL) 25 MG tablet Take 1 tablet (25 mg total) by mouth at bedtime. 90 tablet 1  . cholestyramine (QUESTRAN) 4 GM/DOSE powder Take 1 packet (4 g total) by mouth 2 (two) times daily with a meal. 378 g 11  . DULoxetine (CYMBALTA) 30 MG capsule Take 1 capsule (30 mg total)  by mouth daily. Take with 60 mg capsule for a total of 90 mg daily 90 capsule 3  . DULoxetine (CYMBALTA) 60 MG capsule Take 1 capsule (60 mg total) by mouth daily. Take with 30 mg capsule for a total of 90 mg daily 90 capsule 3  . feeding supplement, ENSURE ENLIVE, (ENSURE ENLIVE) LIQD Take 237 mLs by mouth daily.    Marland Kitchen gabapentin (NEURONTIN) 300 MG capsule Take 2 capsules (600 mg total) by mouth 4 (four) times daily. 720 capsule 3  . HYDROcodone-acetaminophen (NORCO/VICODIN) 5-325 MG tablet Take 5-325 tablets by mouth 3 (three) times daily.    Marland Kitchen lamoTRIgine (LAMICTAL) 200 MG tablet Take 1 tablet (200 mg total) by mouth daily. 90 tablet 1  . omeprazole (PRILOSEC) 20 MG capsule Take 2 capsules (40 mg total) by mouth daily. (Patient taking differently: Take 20 mg by mouth 2 (two) times daily. ) 180 capsule 3  . temazepam (RESTORIL) 30 MG capsule Take 1 capsule (30 mg total) by mouth Nightly. 90 capsule 1  . ARIPiprazole (ABILIFY) 2 MG tablet Take 1 tablet (2 mg total) by mouth daily. Start in the morning 90 tablet 1  . diphenhydrAMINE (BENADRYL) 25 MG tablet Take 25 mg by mouth every 6 (six) hours as needed.    . methocarbamol (ROBAXIN) 500 MG tablet Take 500 mg by mouth 3 (three) times daily.    Marland Kitchen saccharomyces boulardii (FLORASTOR) 250 MG capsule Take 1 capsule (250 mg total) by mouth 2 (two) times daily. (Patient not taking: Reported on 07/27/2017)     No current facility-administered medications for this visit.      Musculoskeletal: Strength & Muscle Tone: within normal limits Gait & Station: normal Patient leans:  N/A  Psychiatric Specialty Exam: ROS  Blood pressure 118/76, pulse (!) 111, height 5' 0.5" (1.537 m), weight 167 lb (75.8 kg), SpO2 97 %.Body mass index is 32.08 kg/m.  General Appearance: Casual and Fairly Groomed  Eye Contact:  Fair  Speech:  Clear and Coherent and Normal Rate  Volume:  Normal  Mood:  Dysphoric  Affect:  Congruent  Thought Process:  Coherent, Goal Directed and Descriptions of Associations: Intact  Orientation:  Full (Time, Place, and Person)  Thought Content: Logical   Suicidal Thoughts:  Yes.  without intent/plan  Homicidal Thoughts:  No  Memory:  Immediate;   Fair  Judgement:  Fair  Insight:  Shallow  Psychomotor Activity:  Normal  Concentration:  Attention Span: Fair  Recall:  [employer] of Knowledge: Fair  Language: Fair  Akathisia:  Negative  Handed:  Right  AIMS (if indicated): not done  Assets:  Agricultural consultant Housing Leisure Time Transportation  ADL's:  Intact  Cognition: WNL  Sleep:  Good   Screenings: PHQ2-9     Office Visit from 08/10/2013 in [place]  PHQ-2 Total Score  1       Assessment and Plan:  [name] is a 67 year old female with recurrent major depressive disorder, persistent depressive disorder, complicated by cluster B personality traits.  She tends to be fairly isolated in terms of her socialization and interaction with others.  She is quite introverted, and as such has tended to avoid taking on social activities that are outside of her comfort zone.  Attempts to troubleshoot and consider alternative activities tend to be met with passive resistance and reasons as to why a particular activity could not work.  Her presentation has remained generally fairly stable, she reports  an increase in her depressive symptoms over the winter recently.  We agreed to increase Lamictal as below.  She has not had any skin rash or intolerance.  We have also agreed to restart  low-dose of atypical antipsychotic -we will start with Abilify, given that risperidone had increased her appetite.  Will follow-up in 8-10 weeks.  1. Major depressive disorder, recurrent episode, moderate ([place])   2. Psychophysiological insomnia     Status of current problems: gradually worsening  Labs Ordered: No orders of the defined types were placed in this encounter.   Labs Reviewed: NA  Collateral Obtained/Records Reviewed: N/A  Plan:  Continue Cymbalta 90 mg daily Increase Lamictal to 200 mg daily Continue gabapentin 600 mg 4 times daily for pain Initiate Abilify 2 mg daily for augmentation of antidepressant Continue Elavil 25 mg at bedtime Continue Restoril 30 mg at bedtime Return to clinic in 8-10 weeks  I spent 30 minutes with the patient in direct face-to-face clinical care.  Greater than 50% of this time was spent in counseling and coordination of care with the patient.    Aundra Dubin, MD 07/27/2017, 3:50 PM

## 2017-08-24 DIAGNOSIS — M47817 Spondylosis without myelopathy or radiculopathy, lumbosacral region: Secondary | ICD-10-CM | POA: Diagnosis not present

## 2017-08-24 DIAGNOSIS — M4807 Spinal stenosis, lumbosacral region: Secondary | ICD-10-CM | POA: Diagnosis not present

## 2017-08-24 DIAGNOSIS — G894 Chronic pain syndrome: Secondary | ICD-10-CM | POA: Diagnosis not present

## 2017-08-24 DIAGNOSIS — F329 Major depressive disorder, single episode, unspecified: Secondary | ICD-10-CM | POA: Diagnosis not present

## 2017-08-25 DIAGNOSIS — D638 Anemia in other chronic diseases classified elsewhere: Secondary | ICD-10-CM | POA: Diagnosis not present

## 2017-08-25 DIAGNOSIS — Z87891 Personal history of nicotine dependence: Secondary | ICD-10-CM | POA: Diagnosis not present

## 2017-08-25 DIAGNOSIS — D649 Anemia, unspecified: Secondary | ICD-10-CM | POA: Diagnosis not present

## 2017-08-25 DIAGNOSIS — M8580 Other specified disorders of bone density and structure, unspecified site: Secondary | ICD-10-CM | POA: Diagnosis not present

## 2017-08-25 DIAGNOSIS — M810 Age-related osteoporosis without current pathological fracture: Secondary | ICD-10-CM | POA: Diagnosis not present

## 2017-08-28 ENCOUNTER — Inpatient Hospital Stay: Payer: PPO

## 2017-08-28 ENCOUNTER — Inpatient Hospital Stay: Payer: PPO | Attending: Hematology and Oncology | Admitting: Hematology and Oncology

## 2017-08-28 ENCOUNTER — Telehealth: Payer: Self-pay | Admitting: Hematology and Oncology

## 2017-08-28 VITALS — BP 156/80 | HR 111 | Temp 98.3°F | Resp 20 | Ht 60.5 in | Wt 173.9 lb

## 2017-08-28 DIAGNOSIS — K5900 Constipation, unspecified: Secondary | ICD-10-CM | POA: Diagnosis not present

## 2017-08-28 DIAGNOSIS — E119 Type 2 diabetes mellitus without complications: Secondary | ICD-10-CM | POA: Diagnosis not present

## 2017-08-28 DIAGNOSIS — Z9011 Acquired absence of right breast and nipple: Secondary | ICD-10-CM | POA: Diagnosis not present

## 2017-08-28 DIAGNOSIS — Z87891 Personal history of nicotine dependence: Secondary | ICD-10-CM | POA: Insufficient documentation

## 2017-08-28 DIAGNOSIS — I10 Essential (primary) hypertension: Secondary | ICD-10-CM | POA: Diagnosis not present

## 2017-08-28 DIAGNOSIS — F329 Major depressive disorder, single episode, unspecified: Secondary | ICD-10-CM | POA: Insufficient documentation

## 2017-08-28 DIAGNOSIS — K219 Gastro-esophageal reflux disease without esophagitis: Secondary | ICD-10-CM | POA: Diagnosis not present

## 2017-08-28 DIAGNOSIS — Z96641 Presence of right artificial hip joint: Secondary | ICD-10-CM | POA: Insufficient documentation

## 2017-08-28 DIAGNOSIS — Z923 Personal history of irradiation: Secondary | ICD-10-CM | POA: Diagnosis not present

## 2017-08-28 DIAGNOSIS — D72819 Decreased white blood cell count, unspecified: Secondary | ICD-10-CM | POA: Insufficient documentation

## 2017-08-28 DIAGNOSIS — F419 Anxiety disorder, unspecified: Secondary | ICD-10-CM | POA: Insufficient documentation

## 2017-08-28 DIAGNOSIS — M797 Fibromyalgia: Secondary | ICD-10-CM | POA: Diagnosis not present

## 2017-08-28 DIAGNOSIS — D649 Anemia, unspecified: Secondary | ICD-10-CM

## 2017-08-28 DIAGNOSIS — Z853 Personal history of malignant neoplasm of breast: Secondary | ICD-10-CM

## 2017-08-28 DIAGNOSIS — B1920 Unspecified viral hepatitis C without hepatic coma: Secondary | ICD-10-CM | POA: Diagnosis not present

## 2017-08-28 DIAGNOSIS — Z9221 Personal history of antineoplastic chemotherapy: Secondary | ICD-10-CM | POA: Diagnosis not present

## 2017-08-28 DIAGNOSIS — Z79899 Other long term (current) drug therapy: Secondary | ICD-10-CM

## 2017-08-28 DIAGNOSIS — M1990 Unspecified osteoarthritis, unspecified site: Secondary | ICD-10-CM | POA: Diagnosis not present

## 2017-08-28 LAB — CMP (CANCER CENTER ONLY)
ALBUMIN: 3.3 g/dL — AB (ref 3.5–5.0)
ALT: 25 U/L (ref 0–55)
AST: 35 U/L — ABNORMAL HIGH (ref 5–34)
Alkaline Phosphatase: 108 U/L (ref 40–150)
Anion gap: 13 — ABNORMAL HIGH (ref 3–11)
BILIRUBIN TOTAL: 0.3 mg/dL (ref 0.2–1.2)
BUN: 11 mg/dL (ref 7–26)
CO2: 23 mmol/L (ref 22–29)
Calcium: 8.9 mg/dL (ref 8.4–10.4)
Chloride: 105 mmol/L (ref 98–109)
Creatinine: 0.76 mg/dL (ref 0.60–1.10)
GFR, Est AFR Am: >60 mL/min (ref >60)
GLUCOSE: 91 mg/dL (ref 70–140)
POTASSIUM: 4.4 mmol/L (ref 3.5–5.1)
Sodium: 141 mmol/L (ref 136–145)
TOTAL PROTEIN: 7 g/dL (ref 6.4–8.3)

## 2017-08-28 LAB — CBC WITH DIFFERENTIAL (CANCER CENTER ONLY)
BASOS ABS: 0 10*3/uL (ref 0.0–0.1)
BASOS PCT: 1 %
Eosinophils Absolute: 0 10*3/uL (ref 0.0–0.5)
Eosinophils Relative: 1 %
HEMATOCRIT: 30.6 % — AB (ref 34.8–46.6)
HEMOGLOBIN: 9.7 g/dL — AB (ref 11.6–15.9)
LYMPHS PCT: 47 %
Lymphs Abs: 1.8 10*3/uL (ref 0.9–3.3)
MCH: 29.3 pg (ref 25.1–34.0)
MCHC: 31.9 g/dL (ref 31.5–36.0)
MCV: 91.8 fL (ref 79.5–101.0)
Monocytes Absolute: 0.2 10*3/uL (ref 0.1–0.9)
Monocytes Relative: 5 %
NEUTROS PCT: 46 %
Neutro Abs: 1.8 10*3/uL (ref 1.5–6.5)
Platelet Count: 205 10*3/uL (ref 145–400)
RBC: 3.33 MIL/uL — AB (ref 3.70–5.45)
RDW: 15.8 % — ABNORMAL HIGH (ref 11.2–14.5)
WBC: 3.8 10*3/uL — AB (ref 3.9–10.3)

## 2017-08-28 LAB — IRON AND TIBC
Iron: 66 ug/dL (ref 41–142)
SATURATION RATIOS: 14 % — AB (ref 21–57)
TIBC: 476 ug/dL — AB (ref 236–444)
UIBC: 410 ug/dL

## 2017-08-28 LAB — FERRITIN: Ferritin: 19 ng/mL (ref 9–269)

## 2017-08-28 LAB — LACTATE DEHYDROGENASE: LDH: 197 U/L (ref 125–245)

## 2017-08-28 NOTE — Telephone Encounter (Signed)
Scheduled appt per 3/1 los - Gave patient AVS and calender per los.  

## 2017-09-04 ENCOUNTER — Telehealth: Payer: Self-pay | Admitting: Hematology and Oncology

## 2017-09-04 ENCOUNTER — Inpatient Hospital Stay (HOSPITAL_BASED_OUTPATIENT_CLINIC_OR_DEPARTMENT_OTHER): Payer: PPO | Admitting: Hematology and Oncology

## 2017-09-04 VITALS — BP 149/88 | HR 117 | Temp 98.4°F | Resp 19 | Ht 60.5 in | Wt 172.4 lb

## 2017-09-04 DIAGNOSIS — D72819 Decreased white blood cell count, unspecified: Secondary | ICD-10-CM | POA: Diagnosis not present

## 2017-09-04 DIAGNOSIS — Z79899 Other long term (current) drug therapy: Secondary | ICD-10-CM | POA: Diagnosis not present

## 2017-09-04 DIAGNOSIS — Z9011 Acquired absence of right breast and nipple: Secondary | ICD-10-CM

## 2017-09-04 DIAGNOSIS — Z853 Personal history of malignant neoplasm of breast: Secondary | ICD-10-CM | POA: Diagnosis not present

## 2017-09-04 DIAGNOSIS — D509 Iron deficiency anemia, unspecified: Secondary | ICD-10-CM

## 2017-09-04 DIAGNOSIS — Z9221 Personal history of antineoplastic chemotherapy: Secondary | ICD-10-CM

## 2017-09-04 DIAGNOSIS — D649 Anemia, unspecified: Secondary | ICD-10-CM

## 2017-09-04 DIAGNOSIS — Z923 Personal history of irradiation: Secondary | ICD-10-CM | POA: Diagnosis not present

## 2017-09-04 MED ORDER — FERROUS GLUCONATE 324 (38 FE) MG PO TABS: 324.0000 mg | ORAL_TABLET | Freq: Three times a day (TID) | ORAL | 0 refills | Status: DC

## 2017-09-04 NOTE — Telephone Encounter (Signed)
Scheduled appts per 3/8 los - patient did not want avs or calendar.

## 2017-09-08 ENCOUNTER — Encounter: Payer: Self-pay | Admitting: Hematology and Oncology

## 2017-09-08 DIAGNOSIS — D649 Anemia, unspecified: Secondary | ICD-10-CM | POA: Insufficient documentation

## 2017-09-08 DIAGNOSIS — D72819 Decreased white blood cell count, unspecified: Secondary | ICD-10-CM | POA: Insufficient documentation

## 2017-09-08 NOTE — Assessment & Plan Note (Signed)
67 y.o. female with past medical history of breast cancer treatment with systemic therapy as well as depression, hepatitis C infection, and fibromyalgia.  Patient carries a diagnosis of iron deficiency, but at this time I do not see adequate substantiation of the diagnosis based on the available lab work.  Patient does have a mild anemia which is normocytic and normochromic as opposed to microcytosis and hypochromia which one would expect from iron deficiency.  The low white blood cell count was observed in October 2018 with no white blood cell count differential available and no repeat values since then.  It appears to be a new finding is not present a year prior.  Patient does not have reported history of recurrent infections and no active symptoms of infection.  Plan: -Repeat labs today to reassess hematological status prior to proceeding with a more complex investigation. -Return to clinic in 1 week to review the findings.

## 2017-09-08 NOTE — Assessment & Plan Note (Signed)
67 y.o. female with past medical history of breast cancer treatment with systemic therapy as well as depression, hepatitis C infection, and fibromyalgia.  Patient was referred to our clinic for evaluation of leukopenia and anemia.  Our evaluation revealed presence of mild neutropenia with improving white blood cell count and presence of moderate anemia which is normocytic and normochromic in nature with possibly low iron values contributing to the anemia.  In the past, patient has been on oral iron supplementation but did not tolerate oral ferrous sulfate due to constipation.  She is agreeable to proceed with alternative iron supplementation and stool softener.  Persistent leukocytosis and worsening anemia warrant additional investigation as outlined below.   Plan: -Additional labs today as outlined below -Consult interventional radiology for bone marrow biopsy -Start ferrous gluconate 1 tablet/day and increase up to 3 tablets/day if tolerated. -Metamucil once to twice per day to counter potential development of constipation. -Return to my clinic 2 weeks after bone marrow biopsy to discuss the results.

## 2017-09-08 NOTE — Progress Notes (Signed)
[facility] Follow-up Visit:  Assessment: Anemia 67 y.o. female with past medical history of breast cancer treatment with systemic therapy as well as depression, hepatitis C infection, and fibromyalgia.  Patient was referred to our clinic for evaluation of leukopenia and anemia.  Our evaluation revealed presence of mild neutropenia with improving white blood cell count and presence of moderate anemia which is normocytic and normochromic in nature with possibly low iron values contributing to the anemia.  In the past, patient has been on oral iron supplementation but did not tolerate oral ferrous sulfate due to constipation.  She is agreeable to proceed with alternative iron supplementation and stool softener.  Persistent leukocytosis and worsening anemia warrant additional investigation as outlined below.   Plan: -Additional labs today as outlined below -Consult interventional radiology for bone marrow biopsy -Start ferrous gluconate 1 tablet/day and increase up to 3 tablets/day if tolerated. -Metamucil once to twice per day to counter potential development of constipation. -Return to my clinic 2 weeks after bone marrow biopsy to discuss the results.   Voice recognition software was used and creation of this note. Despite my best effort at editing the text, some misspelling/errors may have occurred.  Orders Placed This Encounter  Procedures  . CT Biopsy    Standing Status:   Future    Standing Expiration Date:   09/04/2018    Order Specific Question:   Lab orders requested (DO NOT place separate lab orders, these will be automatically ordered during procedure specimen collection):    Answer:   Surgical Pathology    Comments:   pathol, flow cytometry, cytogenetics and MDS FISH    Order Specific Question:   Lab orders requested (DO NOT place separate lab orders, these will be automatically ordered during procedure specimen collection):    Answer:   Other    Order  Specific Question:   Reason for Exam (SYMPTOM  OR DIAGNOSIS REQUIRED)    Answer:   Mild leukopenia, anemia -- please eval for MDS    Order Specific Question:   Preferred imaging location?    Answer:   [facility]    Order Specific Question:   Radiology Contrast Protocol - do NOT remove file path    Answer:   \\[email or web address]  . CT BONE MARROW BIOPSY & ASPIRATION    Standing Status:   Future    Standing Expiration Date:   12/06/2018    Order Specific Question:   Reason for Exam (SYMPTOM  OR DIAGNOSIS REQUIRED)    Answer:   Possible MDS    Order Specific Question:   Preferred imaging location?    Answer:   [facility]    Order Specific Question:   Radiology Contrast Protocol - do NOT remove file path    Answer:   \\[email or web address].pdf  . CBC with Differential ([place] Only)    Standing Status:   Future    Standing Expiration Date:   09/05/2018  . CMP ([place] only)    Standing Status:   Future    Standing Expiration Date:   09/05/2018  . Iron and TIBC    Standing Status:   Future    Standing Expiration Date:   09/05/2018  . Ferritin    Standing Status:   Future    Standing Expiration Date:   09/05/2018    Cancer Staging No matching staging information was found for the patient.  All questions were answered.  . The patient knows to call the  clinic with any problems, questions or concerns.  This note was electronically signed.    History of Presenting Illness [name] is a 67 y.o. female followed in the [facility] for neutropenia and iron deficiency anemia, referred by Dr Deland Pretty.  Patient's past medical history is significant for history of 2 episodes of breast cancer on the right side initially treated with lumpectomy followed by mastectomy and chemoradiotherapy administered by Dr. Jana Hakim due to cancer recurrence, history of hepatitis C, depression, hypertension, fibromyalgia, insomnia.   Patient's previous surgeries include cholecystectomy, total abdominal hysterectomy with bilateral salpingo-oophorectomy and right total hip replacement in 2017.  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  Currently taking no over-the-counter medications or herbal supplements, also is not taking oral iron supplement due to significant constipation.  Family history significant for sister with breast cancer and factor V Leiden mutation.  At the present time, patient denies any fevers, chills, night sweats.  Denies any weakness or sensory deficits in the hands, or extremities.  Denies shortness of breath, chest pain or cough.  No nausea, vomiting, abdominal pain, diarrhea, constipation.  No dysuria or hematuria.  Patient denies hematochezia, melena, epistaxis, hemoptysis, or hematemesis..  Oncological/hematological History: --Labs, 03/10/14: WBC 7.8,                                                 Hgb 11.7, MCV 82.0, MCH 27.0, MCHC 32.9, RDW 14.9, Plt 240;                                                          Vit D-25OH 39.5 --Labs, 08/25/14: WBC 8.1,                                                 Hgb 10.3, MCV 80.5, MCH 24.5, MCHC 30.4, RDW 15.8, Plt 255;                                                          Vit D-25OH 48.4 --Labs, 04/10/16: WBC 6.7, ANC 3.1, ALC 3.0, Mono 0.5, Hgb   8.9, MCV 94.0, MCH 28.3, MCHC 30.2, RDW 14.0, Plt 266; Fe 46, FeSat 13%, TIBC 360, Transferrin 329 --Labs, 04/14/17: WBC 3.4,                                                 Hgb 10.9, MCV 89.7, MCH 28.7, MCHC 32.0, RDW 16.0, Plt 190; Fe 58, FeSat 13%, TIBC 448,        Vit D-25OH 29.0 --Labs, 05/13/17: FOBT -- negative x3 --Labs, 08/28/17: WBC 3.8, ANC 1.8, ALC 1.8, Mono 0.2, Hgb   9.7, MCV 91.8,  MCH 29.3, MCHC 31.9, RDW 15.8, Plt 205; Fe 66, FeSat 14%, TIBC 476, Ferritin 19 --Treatment:   --Ferrous gluconate 315m PO Qday-TID, 09/04/17-: Metamucil for  constipation    No history exists.    Medical History: Past Medical History:  Diagnosis Date  . Anemia    2016 on iron for a while   . Anxiety   . ARTHRITIS 10/25/2007  . Arthritis   . Blood transfusion without reported diagnosis   . BREAST CANCER, HX OF 08/03/2006  . [place]    hx of breast ca x 2 on right   . DEPRESSION 08/03/2006  . Diabetes ([place]    prediabetic   . DIABETES MELLITUS, TYPE II 08/03/2006  . DYSPNEA ON EXERTION 11/21/2009   shortness of breath with exertion   . EPISTAXIS, RECURRENT 01/27/2008  . FIBROMYALGIA 01/13/2008  . GERD 04/30/2008  . HEPATITIS C 08/03/2006  . HIP PAIN, RIGHT 04/30/2007  . History of detached retina repair 02/12/2011  . HYPERTENSION 04/30/2008  . INSOMNIA-SLEEP DISORDER-UNSPEC 11/21/2009  . KNEE SPRAIN, ACUTE 01/27/2008  . LEG PAIN, LEFT 01/13/2008  . OSTEOPENIA 07/26/2007  . Persistent vomiting 08/17/2008  . Pneumonia    hx of   . Prediabetes     Surgical History: Past Surgical History:  Procedure Laterality Date  . ABDOMINAL HYSTERECTOMY    . BREAST LUMPECTOMY  2000   right with axillary LND  . CHOLECYSTECTOMY  06/05/2012   Procedure: LAPAROSCOPIC CHOLECYSTECTOMY;  Surgeon: SAdin Hector MD;  Location: [facility];  Service: General;  Laterality: N/A;  . LAPAROSCOPIC LYSIS OF ADHESIONS  06/05/2012   Procedure: LAPAROSCOPIC LYSIS OF ADHESIONS;  Surgeon: SAdin Hector MD;  Location: [facility];  Service: General;  Laterality: N/A;  . MASTECTOMY  2007   Dr. SMargot Chimes . s/p right broken leg with surgury as teen    . s/p thyroid FNA neg.    . TONSILLECTOMY    . TOTAL HIP ARTHROPLASTY Right 01/25/2016   Procedure: RIGHT TOTAL HIP ARTHROPLASTY ANTERIOR APPROACH;  Surgeon: CMcarthur Rossetti MD;  Location: [facility];  Service: Orthopedics;  Laterality: Right;  . TUBAL LIGATION      Family History: Family History  Problem Relation Age of Onset  . Heart disease Mother   . Cancer Sister        breast  . Depression Father   . Depression  Brother     Social History: Social History   Socioeconomic History  . Marital status: Divorced    Spouse name: Not on file  . Number of children: Not on file  . Years of education: Not on file  . Highest education level: Not on file  Social Needs  . Financial resource strain: Not on file  . Food insecurity - worry: Not on file  . Food insecurity - inability: Not on file  . Transportation needs - medical: Not on file  . Transportation needs - non-medical: Not on file  Occupational History  . Occupation: MSports coach Tobacco Use  . Smoking status: Former Smoker    Packs/day: 4.00    Years: 40.00    Pack years: 160.00    Types: Cigarettes    Last attempt to quit: 06/30/2005    Years since quitting: 12.2  . Smokeless tobacco: Never Used  . Tobacco comment: Pt smoked up to 4 ppd  Substance and Sexual Activity  . Alcohol use: Yes    Alcohol/week: 0.0 oz    Comment: rare  . Drug  use: No  . Sexual activity: Not Currently  Other Topics Concern  . Not on file  Social History Narrative  . Not on file    Allergies: Allergies  Allergen Reactions  . Ace Inhibitors Cough  . Contrast Media [Iodinated Diagnostic Agents]     Hallucinations  . Iohexol      Desc: Pt has been pre medicated with prednisone before scans, pt is unsure of type of contrast she reacted to.   . Tape Rash    "Tears my skin all up"    Medications:  Current Outpatient Medications  Medication Sig Dispense Refill  . amitriptyline (ELAVIL) 25 MG tablet Take 1 tablet (25 mg total) by mouth at bedtime. 90 tablet 1  . ARIPiprazole (ABILIFY) 2 MG tablet Take 1 tablet (2 mg total) by mouth daily. Start in the morning 90 tablet 1  . cholestyramine (QUESTRAN) 4 GM/DOSE powder Take 1 packet (4 g total) by mouth 2 (two) times daily with a meal. 378 g 11  . diphenhydrAMINE (BENADRYL) 25 MG tablet Take 25 mg by mouth every 6 (six) hours as needed.    . DULoxetine (CYMBALTA) 30 MG capsule Take 1 capsule (30 mg  total) by mouth daily. Take with 60 mg capsule for a total of 90 mg daily 90 capsule 3  . DULoxetine (CYMBALTA) 60 MG capsule Take 1 capsule (60 mg total) by mouth daily. Take with 30 mg capsule for a total of 90 mg daily 90 capsule 3  . feeding supplement, ENSURE ENLIVE, (ENSURE ENLIVE) LIQD Take 237 mLs by mouth daily.    . ferrous gluconate (FERGON) 324 MG tablet Take 1 tablet (324 mg total) by mouth 3 (three) times daily with meals. 90 tablet 0  . gabapentin (NEURONTIN) 300 MG capsule Take 2 capsules (600 mg total) by mouth 4 (four) times daily. 720 capsule 3  . HYDROcodone-acetaminophen (NORCO/VICODIN) 5-325 MG tablet Take 5-325 tablets by mouth 3 (three) times daily.    Marland Kitchen lamoTRIgine (LAMICTAL) 200 MG tablet Take 1 tablet (200 mg total) by mouth daily. 90 tablet 1  . methocarbamol (ROBAXIN) 500 MG tablet Take 500 mg by mouth 3 (three) times daily.    Marland Kitchen omeprazole (PRILOSEC) 20 MG capsule Take 2 capsules (40 mg total) by mouth daily. (Patient taking differently: Take 20 mg by mouth 2 (two) times daily. ) 180 capsule 3  . saccharomyces boulardii (FLORASTOR) 250 MG capsule Take 1 capsule (250 mg total) by mouth 2 (two) times daily. (Patient not taking: Reported on 07/27/2017)    . temazepam (RESTORIL) 30 MG capsule Take 1 capsule (30 mg total) by mouth Nightly. 90 capsule 1   No current facility-administered medications for this visit.     Review of Systems: Review of Systems  All other systems reviewed and are negative.    PHYSICAL EXAMINATION Blood pressure (!) 149/88, pulse (!) 117, temperature 98.4 F (36.9 C), temperature source Oral, resp. rate 19, height 5' 0.5" (1.537 m), weight 172 lb 6.4 oz (78.2 kg), SpO2 98 %.  ECOG PERFORMANCE STATUS: 2 - Symptomatic, <50% confined to bed  Physical Exam  Constitutional: She is oriented to person, place, and time and well-developed, well-nourished, and in no distress. No distress.  HENT:  Head: Normocephalic and atraumatic.  Mouth/Throat:  Oropharynx is clear and moist. No oropharyngeal exudate.  Eyes: Conjunctivae and EOM are normal. Pupils are equal, round, and reactive to light. No scleral icterus.  Neck: No thyromegaly present.  Cardiovascular: Normal rate, regular rhythm and  normal heart sounds.  No murmur heard. Pulmonary/Chest: Effort normal and breath sounds normal. No respiratory distress. She has no wheezes. She has no rales.  Abdominal: Soft. Bowel sounds are normal. She exhibits no distension and no mass. There is no tenderness. There is no guarding.  Musculoskeletal: She exhibits no edema.  Lymphadenopathy:    She has no cervical adenopathy.  Neurological: She is alert and oriented to person, place, and time. She has normal reflexes. No cranial nerve deficit.  Skin: Skin is warm and dry. No rash noted. She is not diaphoretic. No erythema. No pallor.     LABORATORY DATA: I have personally reviewed the data as listed: No visits with results within 1 Week(s) from this visit.  Latest known visit with results is:  Appointment on 08/28/2017  Component Date Value Ref Range Status  . Ferritin 08/28/2017 19  9 - 269 ng/mL Final   Performed at [facility], [place] [address] [place], [place] [address]  . Iron 08/28/2017 66  41 - 142 ug/dL Final  . TIBC 08/28/2017 476* 236 - 444 ug/dL Final  . Saturation Ratios 08/28/2017 14* 21 - 57 % Final  . UIBC 08/28/2017 410  ug/dL Final   Performed at [facility], [place] [address] [place], [place] [address]  . LDH 08/28/2017 197  125 - 245 U/L Final   Performed at [facility], [place] [address] [place], [place] [address]  . Sodium 08/28/2017 141  136 - 145 mmol/L Final  . Potassium 08/28/2017 4.4  3.5 - 5.1 mmol/L Final  . Chloride 08/28/2017 105  98 - 109 mmol/L Final  . CO2 08/28/2017 23  22 - 29 mmol/L Final  . Glucose, Bld 08/28/2017 91  70 - 140 mg/dL Final  . BUN 08/28/2017 11  7 - 26 mg/dL Final   . Creatinine 08/28/2017 0.76  0.60 - 1.10 mg/dL Final  . Calcium 08/28/2017 8.9  8.4 - 10.4 mg/dL Final  . Total Protein 08/28/2017 7.0  6.4 - 8.3 g/dL Final  . Albumin 08/28/2017 3.3* 3.5 - 5.0 g/dL Final  . AST 08/28/2017 35* 5 - 34 U/L Final  . ALT 08/28/2017 25  0 - 55 U/L Final  . Alkaline Phosphatase 08/28/2017 108  40 - 150 U/L Final  . Total Bilirubin 08/28/2017 0.3  0.2 - 1.2 mg/dL Final  . GFR, Est Non Af Am 08/28/2017 >60  >60 mL/min Final  . GFR, Est AFR Am 08/28/2017 >60  >60 mL/min Final   Comment: (NOTE) The eGFR has been calculated using the CKD EPI equation. This calculation has not been validated in all clinical situations. eGFR's persistently <60 mL/min signify possible Chronic Kidney Disease.   Georgiann Hahn gap 08/28/2017 13* 3 - 11 Final   Performed at [facility], [place] [address] [place], [place] [address]  . WBC Count 08/28/2017 3.8* 3.9 - 10.3 K/uL Final  . RBC 08/28/2017 3.33* 3.70 - 5.45 MIL/uL Final  . Hemoglobin 08/28/2017 9.7* 11.6 - 15.9 g/dL Final  . HCT 08/28/2017 30.6* 34.8 - 46.6 % Final  . MCV 08/28/2017 91.8  79.5 - 101.0 fL Final  . MCH 08/28/2017 29.3  25.1 - 34.0 pg Final  . MCHC 08/28/2017 31.9  31.5 - 36.0 g/dL Final  . RDW 08/28/2017 15.8* 11.2 - 14.5 % Final  . Platelet Count 08/28/2017 205  145 - 400 K/uL Final  . Neutrophils Relative % 08/28/2017 46  %  Final  . Neutro Abs 08/28/2017 1.8  1.5 - 6.5 K/uL Final  . Lymphocytes Relative 08/28/2017 47  % Final  . Lymphs Abs 08/28/2017 1.8  0.9 - 3.3 K/uL Final  . Monocytes Relative 08/28/2017 5  % Final  . Monocytes Absolute 08/28/2017 0.2  0.1 - 0.9 K/uL Final  . Eosinophils Relative 08/28/2017 1  % Final  . Eosinophils Absolute 08/28/2017 0.0  0.0 - 0.5 K/uL Final  . Basophils Relative 08/28/2017 1  % Final  . Basophils Absolute 08/28/2017 0.0  0.0 - 0.1 K/uL Final   Performed at [facility], [place] [address] [place], [place] [address]        Ardath Sax, MD

## 2017-09-08 NOTE — Progress Notes (Signed)
[facility] New Visit:  Assessment: Leukopenia 67 y.o. female with past medical history of breast cancer treatment with systemic therapy as well as depression, hepatitis C infection, and fibromyalgia.  Patient carries a diagnosis of iron deficiency, but at this time I do not see adequate substantiation of the diagnosis based on the available lab work.  Patient does have a mild anemia which is normocytic and normochromic as opposed to microcytosis and hypochromia which one would expect from iron deficiency.  The low white blood cell count was observed in October 2018 with no white blood cell count differential available and no repeat values since then.  It appears to be a new finding is not present a year prior.  Patient does not have reported history of recurrent infections and no active symptoms of infection.  Plan: -Repeat labs today to reassess hematological status prior to proceeding with a more complex investigation. -Return to clinic in 1 week to review the findings.    Voice recognition software was used and creation of this note. Despite my best effort at editing the text, some misspelling/errors may have occurred. Orders Placed This Encounter  Procedures  . CBC with Differential (Cancer Center Only)    Standing Status:   Future    Number of Occurrences:   1    Standing Expiration Date:   08/29/2018  . CMP ([facility] only)    Standing Status:   Future    Number of Occurrences:   1    Standing Expiration Date:   08/29/2018  . Lactate dehydrogenase (LDH)    Standing Status:   Future    Number of Occurrences:   1    Standing Expiration Date:   08/28/2018  . Iron and TIBC    Standing Status:   Future    Number of Occurrences:   1    Standing Expiration Date:   08/29/2018  . Ferritin    Standing Status:   Future    Number of Occurrences:   1    Standing Expiration Date:   08/29/2018    All questions were answered.  . The patient knows to call the clinic with  any problems, questions or concerns.  This note was electronically signed.    History of Presenting Illness [name] 67 y.o. presenting to the [facility] for neutropenia and iron deficiency anemia, referred by Dr Deland Pretty.  Patient's past medical history is significant for history of 2 episodes of breast cancer on the right side initially treated with lumpectomy followed by mastectomy and chemoradiotherapy administered by Dr. Jana Hakim due to cancer recurrence, history of hepatitis C, depression, hypertension, fibromyalgia, insomnia.  Patient's previous surgeries include cholecystectomy, total abdominal hysterectomy with bilateral salpingo-oophorectomy and right total hip replacement in 2017.  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  Currently taking no over-the-counter medications or herbal supplements, also is not taking oral iron supplement due to significant constipation.  Family history significant for sister with breast cancer and factor V Leiden mutation.  At the present time, patient denies any fevers, chills, night sweats.  Denies any weakness or sensory deficits in the hands, or extremities.  Denies shortness of breath, chest pain or cough.  No nausea, vomiting, abdominal pain, diarrhea, constipation.  No dysuria or hematuria.  Patient denies hematochezia, melena, epistaxis, hemoptysis, or hematemesis..  Oncological/hematological History: --Labs, 03/10/14: WBC 7.8,  Hgb 11.7, MCV 82.0, MCH 27.0, MCHC 32.9, RDW 14.9, Plt 240;      Vit D-25OH 39.5 --Labs, 08/25/14: WBC 8.1,              Hgb 10.3, MCV 80.5, MCH 24.5, MCHC 30.4, RDW 15.8, Plt 255;      Vit D-25OH 48.4 --Labs, 04/10/16: WBC 6.7, ANC 3.1, ALC 3.0, Mono 0.5, Hgb   8.9, MCV 94.0, MCH 28.3, MCHC 30.2, RDW 14.0, Plt 266; Fe 46, FeSat 13%, TIBC 360, Transferrin 329 --Labs, 04/14/17: WBC 3.4,              Hgb 10.9, MCV 89.7, MCH 28.7, MCHC 32.0, RDW 16.0, Plt  190; Fe 58, FeSat 13%, TIBC 448,  Vit D-25OH 29.0 --Labs, 05/13/17: FOBT -- negative x3   Medical History: Past Medical History:  Diagnosis Date  . Anemia    2016 on iron for a while   . Anxiety   . ARTHRITIS 10/25/2007  . Arthritis   . Blood transfusion without reported diagnosis   . BREAST CANCER, HX OF 08/03/2006  . Cancer ([place])    hx of breast ca x 2 on right   . DEPRESSION 08/03/2006  . Diabetes ([place])    prediabetic   . DIABETES MELLITUS, TYPE II 08/03/2006  . DYSPNEA ON EXERTION 11/21/2009   shortness of breath with exertion   . EPISTAXIS, RECURRENT 01/27/2008  . FIBROMYALGIA 01/13/2008  . GERD 04/30/2008  . HEPATITIS C 08/03/2006  . HIP PAIN, RIGHT 04/30/2007  . History of detached retina repair 02/12/2011  . HYPERTENSION 04/30/2008  . INSOMNIA-SLEEP DISORDER-UNSPEC 11/21/2009  . KNEE SPRAIN, ACUTE 01/27/2008  . LEG PAIN, LEFT 01/13/2008  . OSTEOPENIA 07/26/2007  . Persistent vomiting 08/17/2008  . Pneumonia    hx of   . Prediabetes     Surgical History: Past Surgical History:  Procedure Laterality Date  . ABDOMINAL HYSTERECTOMY    . BREAST LUMPECTOMY  2000   right with axillary LND  . CHOLECYSTECTOMY  06/05/2012   Procedure: LAPAROSCOPIC CHOLECYSTECTOMY;  Surgeon: Adin Hector, MD;  Location: [facility];  Service: General;  Laterality: N/A;  . LAPAROSCOPIC LYSIS OF ADHESIONS  06/05/2012   Procedure: LAPAROSCOPIC LYSIS OF ADHESIONS;  Surgeon: Adin Hector, MD;  Location: [facility];  Service: General;  Laterality: N/A;  . MASTECTOMY  2007   Dr. Margot Chimes  . s/p right broken leg with surgury as teen    . s/p thyroid FNA neg.    . TONSILLECTOMY    . TOTAL HIP ARTHROPLASTY Right 01/25/2016   Procedure: RIGHT TOTAL HIP ARTHROPLASTY ANTERIOR APPROACH;  Surgeon: Mcarthur Rossetti, MD;  Location: [facility];  Service: Orthopedics;  Laterality: Right;  . TUBAL LIGATION      Family History: Family History  Problem Relation Age of Onset  . Heart disease Mother   . Cancer Sister         breast  . Depression Father   . Depression Brother     Social History: Social History   Socioeconomic History  . Marital status: Divorced    Spouse name: Not on file  . Number of children: Not on file  . Years of education: Not on file  . Highest education level: Not on file  Social Needs  . Financial resource strain: Not on file  . Food insecurity - worry: Not on file  . Food insecurity - inability: Not on file  . Transportation needs - medical: Not on file  . Transportation  needs - non-medical: Not on file  Occupational History  . Occupation: Sports coach  Tobacco Use  . Smoking status: Former Smoker    Packs/day: 4.00    Years: 40.00    Pack years: 160.00    Types: Cigarettes    Last attempt to quit: 06/30/2005    Years since quitting: 12.2  . Smokeless tobacco: Never Used  . Tobacco comment: Pt smoked up to 4 ppd  Substance and Sexual Activity  . Alcohol use: Yes    Alcohol/week: 0.0 oz    Comment: rare  . Drug use: No  . Sexual activity: Not Currently  Other Topics Concern  . Not on file  Social History Narrative  . Not on file    Allergies: Allergies  Allergen Reactions  . Ace Inhibitors Cough  . Contrast Media [Iodinated Diagnostic Agents]     Hallucinations  . Iohexol      Desc: Pt has been pre medicated with prednisone before scans, pt is unsure of type of contrast she reacted to.   . Tape Rash    "Tears my skin all up"    Medications:  Current Outpatient Medications  Medication Sig Dispense Refill  . amitriptyline (ELAVIL) 25 MG tablet Take 1 tablet (25 mg total) by mouth at bedtime. 90 tablet 1  . ARIPiprazole (ABILIFY) 2 MG tablet Take 1 tablet (2 mg total) by mouth daily. Start in the morning 90 tablet 1  . cholestyramine (QUESTRAN) 4 GM/DOSE powder Take 1 packet (4 g total) by mouth 2 (two) times daily with a meal. 378 g 11  . diphenhydrAMINE (BENADRYL) 25 MG tablet Take 25 mg by mouth every 6 (six) hours as needed.    . DULoxetine  (CYMBALTA) 30 MG capsule Take 1 capsule (30 mg total) by mouth daily. Take with 60 mg capsule for a total of 90 mg daily 90 capsule 3  . DULoxetine (CYMBALTA) 60 MG capsule Take 1 capsule (60 mg total) by mouth daily. Take with 30 mg capsule for a total of 90 mg daily 90 capsule 3  . feeding supplement, ENSURE ENLIVE, (ENSURE ENLIVE) LIQD Take 237 mLs by mouth daily.    . ferrous gluconate (FERGON) 324 MG tablet Take 1 tablet (324 mg total) by mouth 3 (three) times daily with meals. 90 tablet 0  . gabapentin (NEURONTIN) 300 MG capsule Take 2 capsules (600 mg total) by mouth 4 (four) times daily. 720 capsule 3  . HYDROcodone-acetaminophen (NORCO/VICODIN) 5-325 MG tablet Take 5-325 tablets by mouth 3 (three) times daily.    Marland Kitchen lamoTRIgine (LAMICTAL) 200 MG tablet Take 1 tablet (200 mg total) by mouth daily. 90 tablet 1  . methocarbamol (ROBAXIN) 500 MG tablet Take 500 mg by mouth 3 (three) times daily.    Marland Kitchen omeprazole (PRILOSEC) 20 MG capsule Take 2 capsules (40 mg total) by mouth daily. (Patient taking differently: Take 20 mg by mouth 2 (two) times daily. ) 180 capsule 3  . saccharomyces boulardii (FLORASTOR) 250 MG capsule Take 1 capsule (250 mg total) by mouth 2 (two) times daily. (Patient not taking: Reported on 07/27/2017)    . temazepam (RESTORIL) 30 MG capsule Take 1 capsule (30 mg total) by mouth Nightly. 90 capsule 1   No current facility-administered medications for this visit.     Review of Systems: Review of Systems  All other systems reviewed and are negative.    PHYSICAL EXAMINATION Blood pressure (!) 156/80, pulse (!) 111, temperature 98.3 F (36.8 C), temperature source Oral,  resp. rate 20, height 5' 0.5" (1.537 m), weight 173 lb 14.4 oz (78.9 kg), SpO2 95 %.  ECOG PERFORMANCE STATUS: 2 - Symptomatic, <50% confined to bed  Physical Exam  Constitutional: She is oriented to person, place, and time and well-developed, well-nourished, and in no distress. No distress.  HENT:   Head: Normocephalic and atraumatic.  Mouth/Throat: Oropharynx is clear and moist. No oropharyngeal exudate.  Eyes: Conjunctivae and EOM are normal. Pupils are equal, round, and reactive to light. No scleral icterus.  Neck: No thyromegaly present.  Cardiovascular: Normal rate, regular rhythm and normal heart sounds.  No murmur heard. Pulmonary/Chest: Effort normal and breath sounds normal. No respiratory distress. She has no wheezes. She has no rales.  Abdominal: Soft. Bowel sounds are normal. She exhibits no distension and no mass. There is no tenderness. There is no guarding.  Musculoskeletal: She exhibits no edema.  Lymphadenopathy:    She has no cervical adenopathy.  Neurological: She is alert and oriented to person, place, and time. She has normal reflexes. No cranial nerve deficit.  Skin: Skin is warm and dry. No rash noted. She is not diaphoretic. No erythema. No pallor.     LABORATORY DATA: I have personally reviewed the data as listed: Appointment on 08/28/2017  Component Date Value Ref Range Status  . Ferritin 08/28/2017 19  9 - 269 ng/mL Final   Performed at [facility], [place] [address], [place], [place] [address]  . Iron 08/28/2017 66  41 - 142 ug/dL Final  . TIBC 08/28/2017 476* 236 - 444 ug/dL Final  . Saturation Ratios 08/28/2017 14* 21 - 57 % Final  . UIBC 08/28/2017 410  ug/dL Final   Performed at [facility], [place] [address] [place], [place] [address]  . LDH 08/28/2017 197  125 - 245 U/L Final   Performed at [facility], [place] [address] [place], [place] [address]  . Sodium 08/28/2017 141  136 - 145 mmol/L Final  . Potassium 08/28/2017 4.4  3.5 - 5.1 mmol/L Final  . Chloride 08/28/2017 105  98 - 109 mmol/L Final  . CO2 08/28/2017 23  22 - 29 mmol/L Final  . Glucose, Bld 08/28/2017 91  70 - 140 mg/dL Final  . BUN 08/28/2017 11  7 - 26 mg/dL Final  . Creatinine 08/28/2017 0.76  0.60 - 1.10  mg/dL Final  . Calcium 08/28/2017 8.9  8.4 - 10.4 mg/dL Final  . Total Protein 08/28/2017 7.0  6.4 - 8.3 g/dL Final  . Albumin 08/28/2017 3.3* 3.5 - 5.0 g/dL Final  . AST 08/28/2017 35* 5 - 34 U/L Final  . ALT 08/28/2017 25  0 - 55 U/L Final  . Alkaline Phosphatase 08/28/2017 108  40 - 150 U/L Final  . Total Bilirubin 08/28/2017 0.3  0.2 - 1.2 mg/dL Final  . GFR, Est Non Af Am 08/28/2017 >60  >60 mL/min Final  . GFR, Est AFR Am 08/28/2017 >60  >60 mL/min Final   Comment: (NOTE) The eGFR has been calculated using the CKD EPI equation. This calculation has not been validated in all clinical situations. eGFR's persistently <60 mL/min signify possible Chronic Kidney Disease.   Georgiann Hahn gap 08/28/2017 13* 3 - 11 Final   Performed at [facility], [place] [address] [place],  [address]  . WBC Count 08/28/2017 3.8* 3.9 - 10.3 K/uL Final  . RBC 08/28/2017 3.33* 3.70 - 5.45 MIL/uL Final  . Hemoglobin  08/28/2017 9.7* 11.6 - 15.9 g/dL Final  . HCT 08/28/2017 30.6* 34.8 - 46.6 % Final  . MCV 08/28/2017 91.8  79.5 - 101.0 fL Final  . MCH 08/28/2017 29.3  25.1 - 34.0 pg Final  . MCHC 08/28/2017 31.9  31.5 - 36.0 g/dL Final  . RDW 08/28/2017 15.8* 11.2 - 14.5 % Final  . Platelet Count 08/28/2017 205  145 - 400 K/uL Final  . Neutrophils Relative % 08/28/2017 46  % Final  . Neutro Abs 08/28/2017 1.8  1.5 - 6.5 K/uL Final  . Lymphocytes Relative 08/28/2017 47  % Final  . Lymphs Abs 08/28/2017 1.8  0.9 - 3.3 K/uL Final  . Monocytes Relative 08/28/2017 5  % Final  . Monocytes Absolute 08/28/2017 0.2  0.1 - 0.9 K/uL Final  . Eosinophils Relative 08/28/2017 1  % Final  . Eosinophils Absolute 08/28/2017 0.0  0.0 - 0.5 K/uL Final  . Basophils Relative 08/28/2017 1  % Final  . Basophils Absolute 08/28/2017 0.0  0.0 - 0.1 K/uL Final   Performed at [facility], [place] [address] [place], [place] [address]         Ardath Sax, MD

## 2017-09-21 ENCOUNTER — Ambulatory Visit (INDEPENDENT_AMBULATORY_CARE_PROVIDER_SITE_OTHER): Payer: PPO | Admitting: Psychiatry

## 2017-09-21 ENCOUNTER — Other Ambulatory Visit: Payer: Self-pay | Admitting: Radiology

## 2017-09-21 ENCOUNTER — Encounter (HOSPITAL_COMMUNITY): Payer: Self-pay | Admitting: Psychiatry

## 2017-09-21 DIAGNOSIS — F332 Major depressive disorder, recurrent severe without psychotic features: Secondary | ICD-10-CM

## 2017-09-21 DIAGNOSIS — F5104 Psychophysiologic insomnia: Secondary | ICD-10-CM | POA: Diagnosis not present

## 2017-09-21 DIAGNOSIS — Z87891 Personal history of nicotine dependence: Secondary | ICD-10-CM | POA: Diagnosis not present

## 2017-09-21 DIAGNOSIS — F331 Major depressive disorder, recurrent, moderate: Secondary | ICD-10-CM

## 2017-09-21 DIAGNOSIS — Z818 Family history of other mental and behavioral disorders: Secondary | ICD-10-CM | POA: Diagnosis not present

## 2017-09-21 MED ORDER — ARIPIPRAZOLE 2 MG PO TABS: 2.0000 mg | ORAL_TABLET | Freq: Every day | ORAL | 1 refills | Status: DC

## 2017-09-21 NOTE — Progress Notes (Signed)
[facility] MD/PA/NP OP Progress Note  09/21/2017 1:24 PM [name]  MRN:  [id number]  Chief Complaint: med questions HPI: [name] reports that she stopped the Lamictal because it was causing her constipation.  She has yet to start the Abilify and had more questions about it.  Reiterated the risks and benefits, and evidence for augmentation of antidepressant therapies.  We agreed to initiate it 2 mg daily, continue the current dose of Cymbalta and Restoril and amitriptyline.  She will follow-up in 8 weeks and contact writer if she has any questions in the meantime.  Visit Diagnosis:    ICD-10-CM   1. Major depressive disorder, recurrent episode, moderate (HCC) F33.1 ARIPiprazole (ABILIFY) 2 MG tablet  2. Severe episode of recurrent major depressive disorder, without psychotic features (HCC) F33.2 ARIPiprazole (ABILIFY) 2 MG tablet  3. Psychophysiological insomnia F51.04 ARIPiprazole (ABILIFY) 2 MG tablet    Past Psychiatric History: See intake H&P for full details. Reviewed, with no updates at this time.   Past Medical History:  Past Medical History:  Diagnosis Date  . Anemia    2016 on iron for a while   . Anxiety   . ARTHRITIS 10/25/2007  . Arthritis   . Blood transfusion without reported diagnosis   . BREAST CANCER, HX OF 08/03/2006  . Cancer ([place])    hx of breast ca x 2 on right   . DEPRESSION 08/03/2006  . Diabetes ([place])    prediabetic   . DIABETES MELLITUS, TYPE II 08/03/2006  . DYSPNEA ON EXERTION 11/21/2009   shortness of breath with exertion   . EPISTAXIS, RECURRENT 01/27/2008  . FIBROMYALGIA 01/13/2008  . GERD 04/30/2008  . HEPATITIS C 08/03/2006  . HIP PAIN, RIGHT 04/30/2007  . History of detached retina repair 02/12/2011  . HYPERTENSION 04/30/2008  . INSOMNIA-SLEEP DISORDER-UNSPEC 11/21/2009  . KNEE SPRAIN, ACUTE 01/27/2008  . LEG PAIN, LEFT 01/13/2008  . OSTEOPENIA 07/26/2007  . Persistent vomiting 08/17/2008  . Pneumonia    hx of   . Prediabetes     Past Surgical  History:  Procedure Laterality Date  . ABDOMINAL HYSTERECTOMY    . BREAST LUMPECTOMY  2000   right with axillary LND  . CHOLECYSTECTOMY  06/05/2012   Procedure: LAPAROSCOPIC CHOLECYSTECTOMY;  Surgeon: Adin Hector, MD;  Location: [facility];  Service: General;  Laterality: N/A;  . LAPAROSCOPIC LYSIS OF ADHESIONS  06/05/2012   Procedure: LAPAROSCOPIC LYSIS OF ADHESIONS;  Surgeon: Adin Hector, MD;  Location: [facility];  Service: General;  Laterality: N/A;  . MASTECTOMY  2007   Dr. Margot Chimes  . s/p right broken leg with surgury as teen    . s/p thyroid FNA neg.    . TONSILLECTOMY    . TOTAL HIP ARTHROPLASTY Right 01/25/2016   Procedure: RIGHT TOTAL HIP ARTHROPLASTY ANTERIOR APPROACH;  Surgeon: Mcarthur Rossetti, MD;  Location: [facility];  Service: Orthopedics;  Laterality: Right;  . TUBAL LIGATION      Family Psychiatric History: See intake H&P for full details. Reviewed, with no updates at this time.   Family History:  Family History  Problem Relation Age of Onset  . Heart disease Mother   . Cancer Sister        breast  . Depression Father   . Depression Brother     Social History:  Social History   Socioeconomic History  . Marital status: Divorced    Spouse name: Not on file  . Number of children: Not on file  .  Years of education: Not on file  . Highest education level: Not on file  Occupational History  . Occupation: Sports coach  Social Needs  . Financial resource strain: Not on file  . Food insecurity:    Worry: Not on file    Inability: Not on file  . Transportation needs:    Medical: Not on file    Non-medical: Not on file  Tobacco Use  . Smoking status: Former Smoker    Packs/day: 4.00    Years: 40.00    Pack years: 160.00    Types: Cigarettes    Last attempt to quit: 06/30/2005    Years since quitting: 12.2  . Smokeless tobacco: Never Used  . Tobacco comment: Pt smoked up to 4 ppd  Substance and Sexual Activity  . Alcohol use: Yes    Alcohol/week: 0.0  oz    Comment: rare  . Drug use: No  . Sexual activity: Not Currently  Lifestyle  . Physical activity:    Days per week: Not on file    Minutes per session: Not on file  . Stress: Not on file  Relationships  . Social connections:    Talks on phone: Not on file    Gets together: Not on file    Attends religious service: Not on file    Active member of club or organization: Not on file    Attends meetings of clubs or organizations: Not on file    Relationship status: Not on file  Other Topics Concern  . Not on file  Social History Narrative  . Not on file    Allergies:  Allergies  Allergen Reactions  . Ace Inhibitors Cough  . Contrast Media [Iodinated Diagnostic Agents]     Hallucinations  . Iohexol      Desc: Pt has been pre medicated with prednisone before scans, pt is unsure of type of contrast she reacted to.   . Tape Rash    "Tears my skin all up"    Metabolic Disorder Labs: Lab Results  Component Value Date   HGBA1C 5.5 04/06/2016   MPG 111 04/06/2016   MPG 128 (H) 06/04/2012   No results found for: PROLACTIN Lab Results  Component Value Date   CHOL 175 08/10/2013   TRIG 250.0 (H) 08/10/2013   HDL 62.80 08/10/2013   CHOLHDL 3 08/10/2013   VLDL 50.0 (H) 08/10/2013   LDLCALC 58 02/10/2011   LDLCALC 66 11/21/2009   Lab Results  Component Value Date   TSH 3.68 08/10/2013   TSH 3.75 02/10/2011    Therapeutic Level Labs: No results found for: LITHIUM No results found for: VALPROATE No components found for:  CBMZ  Current Medications: Current Outpatient Medications  Medication Sig Dispense Refill  . amitriptyline (ELAVIL) 25 MG tablet Take 1 tablet (25 mg total) by mouth at bedtime. 90 tablet 1  . ARIPiprazole (ABILIFY) 2 MG tablet Take 1 tablet (2 mg total) by mouth daily. Start in the morning 90 tablet 1  . cholestyramine (QUESTRAN) 4 GM/DOSE powder Take 1 packet (4 g total) by mouth 2 (two) times daily with a meal. 378 g 11  . diphenhydrAMINE  (BENADRYL) 25 MG tablet Take 25 mg by mouth every 6 (six) hours as needed.    . DULoxetine (CYMBALTA) 30 MG capsule Take 1 capsule (30 mg total) by mouth daily. Take with 60 mg capsule for a total of 90 mg daily 90 capsule 3  . DULoxetine (CYMBALTA) 60 MG capsule Take 1  capsule (60 mg total) by mouth daily. Take with 30 mg capsule for a total of 90 mg daily 90 capsule 3  . feeding supplement, ENSURE ENLIVE, (ENSURE ENLIVE) LIQD Take 237 mLs by mouth daily.    . ferrous gluconate (FERGON) 324 MG tablet Take 1 tablet (324 mg total) by mouth 3 (three) times daily with meals. 90 tablet 0  . gabapentin (NEURONTIN) 300 MG capsule Take 2 capsules (600 mg total) by mouth 4 (four) times daily. 720 capsule 3  . HYDROcodone-acetaminophen (NORCO/VICODIN) 5-325 MG tablet Take 5-325 tablets by mouth 3 (three) times daily.    . methocarbamol (ROBAXIN) 500 MG tablet Take 500 mg by mouth 3 (three) times daily.    Marland Kitchen omeprazole (PRILOSEC) 20 MG capsule Take 2 capsules (40 mg total) by mouth daily. (Patient taking differently: Take 20 mg by mouth 2 (two) times daily. ) 180 capsule 3  . saccharomyces boulardii (FLORASTOR) 250 MG capsule Take 1 capsule (250 mg total) by mouth 2 (two) times daily. (Patient not taking: Reported on 07/27/2017)    . temazepam (RESTORIL) 30 MG capsule Take 1 capsule (30 mg total) by mouth Nightly. 90 capsule 1   No current facility-administered medications for this visit.      Musculoskeletal: Strength & Muscle Tone: within normal limits Gait & Station: normal Patient leans: N/A  Psychiatric Specialty Exam: ROS  There were no vitals taken for this visit.There is no height or weight on file to calculate BMI.  General Appearance: Casual and Fairly Groomed  Eye Contact:  Good  Speech:  Clear and Coherent and Normal Rate  Volume:  Normal  Mood:  Dysphoric  Affect:  Congruent  Thought Process:  Goal Directed and Descriptions of Associations: Intact  Orientation:  Full (Time, Place,  and Person)  Thought Content: Logical   Suicidal Thoughts:  No  Homicidal Thoughts:  No  Memory:  Immediate;   Fair  Judgement:  Fair  Insight:  Fair  Psychomotor Activity:  Normal  Concentration:  Attention Span: Good  Recall:  Good  Fund of Knowledge: Good  Language: Good  Akathisia:  Negative  Handed:  Right  AIMS (if indicated): not done  Assets:  Communication Skills Desire for Improvement Financial Resources/Insurance Housing  ADL's:  Intact  Cognition: WNL  Sleep:  Fair   Screenings: PHQ2-9     Office Visit from 08/10/2013 in [facility]  PHQ-2 Total Score  1       Assessment and Plan:  [name] presents for medication management follow-up.  Continues to struggle with symptoms of depression, does not feel that Lamictal was providing any benefit and augmentation, and was contributing to constipation.  She agrees to proceed with a augmentation strategy with the use of Abilify.  No acute safety issues and we will follow-up in 8 weeks.  1. Major depressive disorder, recurrent episode, moderate ([place])   2. Severe episode of recurrent major depressive disorder, without psychotic features ([place])   3. Psychophysiological insomnia     Status of current problems: unchanged  Labs Ordered: No orders of the defined types were placed in this encounter.   Labs Reviewed: n/a  Collateral Obtained/Records Reviewed: n/a  Plan:  Continue Cymbalta, Elavil, Restoril Initiate Abilify 2 mg daily for augmentation of antidepressant   I spent 15 minutes with the patient in direct face-to-face clinical care.  Greater than 50% of this time was spent in counseling and coordination of care with the patient.  Aundra Dubin, MD 09/21/2017, 1:24 PM

## 2017-09-22 ENCOUNTER — Encounter (HOSPITAL_COMMUNITY): Payer: Self-pay

## 2017-09-22 ENCOUNTER — Ambulatory Visit (HOSPITAL_COMMUNITY)
Admission: RE | Admit: 2017-09-22 | Discharge: 2017-09-22 | Disposition: A | Discharge status: Home / Self Care | Payer: PPO | Source: Ambulatory Visit | Attending: Hematology and Oncology | Admitting: Hematology and Oncology

## 2017-09-22 DIAGNOSIS — F419 Anxiety disorder, unspecified: Secondary | ICD-10-CM | POA: Diagnosis not present

## 2017-09-22 DIAGNOSIS — Z79899 Other long term (current) drug therapy: Secondary | ICD-10-CM | POA: Diagnosis not present

## 2017-09-22 DIAGNOSIS — E119 Type 2 diabetes mellitus without complications: Secondary | ICD-10-CM | POA: Insufficient documentation

## 2017-09-22 DIAGNOSIS — Z888 Allergy status to other drugs, medicaments and biological substances status: Secondary | ICD-10-CM | POA: Diagnosis not present

## 2017-09-22 DIAGNOSIS — D649 Anemia, unspecified: Secondary | ICD-10-CM | POA: Diagnosis not present

## 2017-09-22 DIAGNOSIS — D4989 Neoplasm of unspecified behavior of other specified sites: Secondary | ICD-10-CM | POA: Diagnosis not present

## 2017-09-22 DIAGNOSIS — Z853 Personal history of malignant neoplasm of breast: Secondary | ICD-10-CM | POA: Diagnosis not present

## 2017-09-22 DIAGNOSIS — R918 Other nonspecific abnormal finding of lung field: Secondary | ICD-10-CM | POA: Insufficient documentation

## 2017-09-22 DIAGNOSIS — K219 Gastro-esophageal reflux disease without esophagitis: Secondary | ICD-10-CM | POA: Diagnosis not present

## 2017-09-22 DIAGNOSIS — M1990 Unspecified osteoarthritis, unspecified site: Secondary | ICD-10-CM | POA: Insufficient documentation

## 2017-09-22 DIAGNOSIS — M797 Fibromyalgia: Secondary | ICD-10-CM | POA: Diagnosis not present

## 2017-09-22 DIAGNOSIS — D72819 Decreased white blood cell count, unspecified: Secondary | ICD-10-CM

## 2017-09-22 DIAGNOSIS — B1920 Unspecified viral hepatitis C without hepatic coma: Secondary | ICD-10-CM | POA: Diagnosis not present

## 2017-09-22 DIAGNOSIS — F329 Major depressive disorder, single episode, unspecified: Secondary | ICD-10-CM | POA: Insufficient documentation

## 2017-09-22 LAB — CBC WITH DIFFERENTIAL/PLATELET
Basophils Absolute: 0 10*3/uL (ref 0.0–0.1)
Basophils Relative: 0 %
EOS PCT: 1 %
Eosinophils Absolute: 0 10*3/uL (ref 0.0–0.7)
HCT: 31.1 % — ABNORMAL LOW (ref 36.0–46.0)
Hemoglobin: 9.7 g/dL — ABNORMAL LOW (ref 12.0–15.0)
LYMPHS ABS: 3 10*3/uL (ref 0.7–4.0)
Lymphocytes Relative: 59 %
MCH: 29.2 pg (ref 26.0–34.0)
MCHC: 31.2 g/dL (ref 30.0–36.0)
MCV: 93.7 fL (ref 78.0–100.0)
MONO ABS: 0.2 10*3/uL (ref 0.1–1.0)
MONOS PCT: 5 %
Neutro Abs: 1.7 10*3/uL (ref 1.7–7.7)
Neutrophils Relative %: 35 %
PLATELETS: 180 10*3/uL (ref 150–400)
RBC: 3.32 MIL/uL — AB (ref 3.87–5.11)
RDW: 16.1 % — AB (ref 11.5–15.5)
WBC: 5 10*3/uL (ref 4.0–10.5)

## 2017-09-22 LAB — PROTIME-INR
INR: 0.92
Prothrombin Time: 12.3 seconds (ref 11.4–15.2)

## 2017-09-22 MED ORDER — MIDAZOLAM HCL 2 MG/2ML IJ SOLN
INTRAMUSCULAR | Status: AC | PRN
  Administered 2017-09-22 (×2): 1 mg via INTRAVENOUS

## 2017-09-22 MED ORDER — FENTANYL CITRATE (PF) 100 MCG/2ML IJ SOLN
INTRAMUSCULAR | Status: AC | PRN
  Administered 2017-09-22 (×2): 50 ug via INTRAVENOUS

## 2017-09-22 MED ORDER — MIDAZOLAM HCL 2 MG/2ML IJ SOLN
INTRAMUSCULAR | Status: AC
  Filled 2017-09-22: qty 2

## 2017-09-22 MED ORDER — LIDOCAINE HCL (PF) 1 % IJ SOLN
INTRAMUSCULAR | Status: AC | PRN
  Administered 2017-09-22: 30 mL

## 2017-09-22 MED ORDER — FENTANYL CITRATE (PF) 100 MCG/2ML IJ SOLN
INTRAMUSCULAR | Status: AC
  Filled 2017-09-22: qty 2

## 2017-09-22 MED ORDER — SODIUM CHLORIDE 0.9 % IV SOLN
INTRAVENOUS | Status: DC
  Administered 2017-09-22: 09:00:00 via INTRAVENOUS

## 2017-09-22 NOTE — Procedures (Signed)
Interventional Radiology Procedure Note  Procedure: CT guided aspirate and core biopsy of left posterior iliac bone Complications: None Recommendations: - Bedrest supine x 1 hrs - OTC's PRN  Pain - Follow biopsy results  Signed,  Jasson Siegmann S. Alaiya Martindelcampo, DO    

## 2017-09-22 NOTE — H&P (Signed)
  Referring Physician(s): Perlov,Mikhail G  Supervising Physician: Wagner, Jaime  Patient Status:  [facility]  Chief Complaint:  "I'm having a bone marrow biopsy"  Subjective: Patient familiar to IR service from prior random liver biopsy in 2007.  She has a known history of breast cancer, hepatitis C, fibromyalgia and depression as well as mild leukopenia and anemia.  She presents today for CT-guided bone marrow biopsy to rule out MDS.  She currently denies fever, headache, chest pain, dyspnea, cough, abdominal pain, nausea, vomiting or bleeding.  She does have some intermittent low back pain.  Past Medical History:  Diagnosis Date  . Anemia    2016 on iron for a while   . Anxiety   . ARTHRITIS 10/25/2007  . Arthritis   . Blood transfusion without reported diagnosis   . BREAST CANCER, HX OF 08/03/2006  . Cancer (HCC)    hx of breast ca x 2 on right   . DEPRESSION 08/03/2006  . Diabetes (HCC)    prediabetic   . DIABETES MELLITUS, TYPE II 08/03/2006  . DYSPNEA ON EXERTION 11/21/2009   shortness of breath with exertion   . EPISTAXIS, RECURRENT 01/27/2008  . FIBROMYALGIA 01/13/2008  . GERD 04/30/2008  . HEPATITIS C 08/03/2006  . HIP PAIN, RIGHT 04/30/2007  . History of detached retina repair 02/12/2011  . HYPERTENSION 04/30/2008  . INSOMNIA-SLEEP DISORDER-UNSPEC 11/21/2009  . KNEE SPRAIN, ACUTE 01/27/2008  . LEG PAIN, LEFT 01/13/2008  . OSTEOPENIA 07/26/2007  . Persistent vomiting 08/17/2008  . Pneumonia    hx of   . Prediabetes    Past Surgical History:  Procedure Laterality Date  . ABDOMINAL HYSTERECTOMY    . BREAST LUMPECTOMY  2000   right with axillary LND  . CHOLECYSTECTOMY  06/05/2012   Procedure: LAPAROSCOPIC CHOLECYSTECTOMY;  Surgeon: Steven C. Gross, MD;  Location: [facility];  Service: General;  Laterality: N/A;  . LAPAROSCOPIC LYSIS OF ADHESIONS  06/05/2012   Procedure: LAPAROSCOPIC LYSIS OF ADHESIONS;  Surgeon: Steven C. Gross, MD;  Location: [facility];  Service: General;   Laterality: N/A;  . MASTECTOMY  2007   Dr. Streck  . s/p right broken leg with surgury as teen    . s/p thyroid FNA neg.    . TONSILLECTOMY    . TOTAL HIP ARTHROPLASTY Right 01/25/2016   Procedure: RIGHT TOTAL HIP ARTHROPLASTY ANTERIOR APPROACH;  Surgeon: Christopher Y Blackman, MD;  Location: [facility];  Service: Orthopedics;  Laterality: Right;  . TUBAL LIGATION      Allergies: Ace inhibitors; Contrast media [iodinated diagnostic agents]; Iohexol; and Tape  Medications: Prior to Admission medications   Medication Sig Start Date End Date Taking? Authorizing Provider  amitriptyline (ELAVIL) 25 MG tablet Take 1 tablet (25 mg total) by mouth at bedtime. 07/27/17  Yes Eksir, Alexander Arya, MD  DULoxetine (CYMBALTA) 30 MG capsule Take 1 capsule (30 mg total) by mouth daily. Take with 60 mg capsule for a total of 90 mg daily 07/27/17 07/27/18 Yes Eksir, Alexander Arya, MD  DULoxetine (CYMBALTA) 60 MG capsule Take 1 capsule (60 mg total) by mouth daily. Take with 30 mg capsule for a total of 90 mg daily 07/27/17 07/27/18 Yes Eksir, Alexander Arya, MD  feeding supplement, ENSURE ENLIVE, (ENSURE ENLIVE) LIQD Take 237 mLs by mouth daily. 04/08/16  Yes Madera, Carlos, MD  ferrous gluconate (FERGON) 324 MG tablet Take 1 tablet (324 mg total) by mouth 3 (three) times daily with meals. 09/04/17 10/04/17 Yes Perlov, Mikhail G, MD    gabapentin (NEURONTIN) 300 MG capsule Take 2 capsules (600 mg total) by mouth 4 (four) times daily. 07/27/17 07/27/18 Yes Eksir, Alexander Arya, MD  HYDROcodone-acetaminophen (NORCO/VICODIN) 5-325 MG tablet Take 5-325 tablets by mouth 3 (three) times daily. 09/18/16  Yes [provider]  Multiple Vitamins-Minerals (MULTIVITAMIN WITH MINERALS) tablet Take 1 tablet by mouth daily.   Yes [provider]  omeprazole (PRILOSEC) 20 MG capsule Take 2 capsules (40 mg total) by mouth daily. Patient taking differently: Take 20 mg by mouth 2 (two) times daily.  04/13/14  Yes John,  James W, MD  psyllium (REGULOID) 0.52 g capsule Take 0.52 g by mouth daily.   Yes [provider]  temazepam (RESTORIL) 30 MG capsule Take 1 capsule (30 mg total) by mouth Nightly. 07/27/17  Yes Eksir, Alexander Arya, MD  ARIPiprazole (ABILIFY) 2 MG tablet Take 1 tablet (2 mg total) by mouth daily. Start in the morning 09/21/17   Eksir, Alexander Arya, MD  cholestyramine (QUESTRAN) 4 GM/DOSE powder Take 1 packet (4 g total) by mouth 2 (two) times daily with a meal. 04/13/14   John, James W, MD  diphenhydrAMINE (BENADRYL) 25 MG tablet Take 25 mg by mouth every 6 (six) hours as needed.    [provider]  methocarbamol (ROBAXIN) 500 MG tablet Take 500 mg by mouth 3 (three) times daily.    [provider]  saccharomyces boulardii (FLORASTOR) 250 MG capsule Take 1 capsule (250 mg total) by mouth 2 (two) times daily. Patient not taking: Reported on 07/27/2017 04/08/16   Madera, Carlos, MD     Vital Signs: BP 122/76   Pulse 95   Temp 98.2 F (36.8 C) (Oral)   Resp 18   SpO2 98%   Physical Exam awake, alert.  Chest clear to auscultation bilaterally.  Heart with regular rate and rhythm.  Abdomen soft, positive bowel sounds, nontender.  No significant lower extremity edema. Imaging: No results found.  Labs:  CBC: Recent Labs    08/28/17 1109 09/22/17 0912  WBC 3.8* 5.0  HGB  --  9.7*  HCT 30.6* 31.1*  PLT 205 180    COAGS: Recent Labs    09/22/17 0912  INR 0.92    BMP: Recent Labs    08/28/17 1109  NA 141  K 4.4  CL 105  CO2 23  GLUCOSE 91  BUN 11  CALCIUM 8.9  CREATININE 0.76  GFRNONAA >60  GFRAA >60    LIVER FUNCTION TESTS: Recent Labs    08/28/17 1109  BILITOT 0.3  AST 35*  ALT 25  ALKPHOS 108  PROT 7.0  ALBUMIN 3.3*    Assessment and Plan:  Pt with known history of breast cancer, hepatitis C, fibromyalgia and depression as well as mild leukopenia and anemia.  She presents today for CT-guided bone marrow biopsy to rule out  MDS. Risks and benefits discussed with the patient including, but not limited to bleeding, infection, damage to adjacent structures or low yield requiring additional tests.  All of the patient's questions were answered, patient is agreeable to proceed. Consent signed and in chart.    Electronically Signed: D. Kevin , PA-C 09/22/2017, 9:53 AM   I spent a total of 20 minutes at the the patient's bedside AND on the patient's hospital floor or unit, greater than 50% of which was counseling/coordinating care for CT-guided bone marrow biopsy      

## 2017-09-22 NOTE — Discharge Instructions (Signed)
Moderate Conscious Sedation, Adult, Care After These instructions provide you with information about caring for yourself after your procedure. Your health care provider may also give you more specific instructions. Your treatment has been planned according to current medical practices, but problems sometimes occur. Call your health care provider if you have any problems or questions after your procedure. What can I expect after the procedure? After your procedure, it is common:  To feel sleepy for several hours.  To feel clumsy and have poor balance for several hours.  To have poor judgment for several hours.  To vomit if you eat too soon.  Follow these instructions at home: For at least 24 hours after the procedure:   Do not: ? Participate in activities where you could fall or become injured. ? Drive. ? Use heavy machinery. ? Drink alcohol. ? Take sleeping pills or medicines that cause drowsiness. ? Make important decisions or sign legal documents. ? Take care of children on your own.  Rest. Eating and drinking  Follow the diet recommended by your health care provider.  If you vomit: ? Drink water, juice, or soup when you can drink without vomiting. ? Make sure you have little or no nausea before eating solid foods. General instructions  Have a responsible adult stay with you until you are awake and alert.  Take over-the-counter and prescription medicines only as told by your health care provider.  If you smoke, do not smoke without supervision.  Keep all follow-up visits as told by your health care provider. This is important. Contact a health care provider if:  You keep feeling nauseous or you keep vomiting.  You feel light-headed.  You develop a rash.  You have a fever. Get help right away if:  You have trouble breathing. This information is not intended to replace advice given to you by your health care provider. Make sure you discuss any questions you have  with your health care provider. Document Released: 04/06/2013 Document Revised: 11/19/2015 Document Reviewed: 10/06/2015 Elsevier Interactive Patient Education  2018 [employer].   Bone Marrow Aspiration and Bone Marrow Biopsy, Adult, Care After This sheet gives you information about how to care for yourself after your procedure. Your health care provider may also give you more specific instructions. If you have problems or questions, contact your health care provider. What can I expect after the procedure? After the procedure, it is common to have:  Mild pain and tenderness.  Swelling.  Bruising.  Follow these instructions at home:  Take over-the-counter or prescription medicines only as told by your health care provider.  Do not take baths, swim, or use a hot tub until your health care provider approves. Ask if you can take a shower or have a sponge bath.  You may shower tomorrow.  Follow instructions from your health care provider about how to take care of the puncture site. Make sure you: ? Wash your hands with soap and water before you change your bandage (dressing). If soap and water are not available, use hand sanitizer. ? Change your dressing as told by your health care provider.  You may remove your dressing tomorrow.  Check your puncture siteevery day for signs of infection. Check for: ? More redness, swelling, or pain. ? More fluid or blood. ? Warmth. ? Pus or a bad smell.  Return to your normal activities as told by your health care provider. Ask your health care provider what activities are safe for you.  Do not drive  for 24 hours if you were given a medicine to help you relax (sedative). °· Keep all follow-up visits as told by your health care provider. This is important. °Contact a health care provider if: °· You have more redness, swelling, or pain around the puncture site. °· You have more fluid or blood coming from the puncture site. °· Your puncture site feels  warm to the touch. °· You have pus or a bad smell coming from the puncture site. °· You have a fever. °· Your pain is not controlled with medicine. °This information is not intended to replace advice given to you by your health care provider. Make sure you discuss any questions you have with your health care provider. °Document Released: 01/03/2005 Document Revised: 01/04/2016 Document Reviewed: 11/28/2015 °Elsevier Interactive Patient Education © 2018 [employer] ° °

## 2017-09-23 ENCOUNTER — Telehealth: Payer: Self-pay | Admitting: Hematology and Oncology

## 2017-09-23 NOTE — Telephone Encounter (Signed)
Appointments moved and patient has been notified per 3/27 sch msg

## 2017-09-24 ENCOUNTER — Ambulatory Visit: Payer: Self-pay | Admitting: Hematology and Oncology

## 2017-09-24 ENCOUNTER — Other Ambulatory Visit: Payer: Self-pay

## 2017-09-24 DIAGNOSIS — M47817 Spondylosis without myelopathy or radiculopathy, lumbosacral region: Secondary | ICD-10-CM | POA: Diagnosis not present

## 2017-09-24 DIAGNOSIS — G894 Chronic pain syndrome: Secondary | ICD-10-CM | POA: Diagnosis not present

## 2017-09-24 DIAGNOSIS — M4807 Spinal stenosis, lumbosacral region: Secondary | ICD-10-CM | POA: Diagnosis not present

## 2017-09-24 DIAGNOSIS — F329 Major depressive disorder, single episode, unspecified: Secondary | ICD-10-CM | POA: Diagnosis not present

## 2017-10-06 ENCOUNTER — Encounter (HOSPITAL_COMMUNITY): Payer: Self-pay | Admitting: Hematology and Oncology

## 2017-10-08 ENCOUNTER — Telehealth: Payer: Self-pay | Admitting: Hematology and Oncology

## 2017-10-08 ENCOUNTER — Inpatient Hospital Stay (HOSPITAL_BASED_OUTPATIENT_CLINIC_OR_DEPARTMENT_OTHER): Payer: PPO | Admitting: Hematology and Oncology

## 2017-10-08 ENCOUNTER — Inpatient Hospital Stay: Payer: PPO

## 2017-10-08 ENCOUNTER — Encounter: Payer: Self-pay | Admitting: Hematology and Oncology

## 2017-10-08 ENCOUNTER — Inpatient Hospital Stay: Payer: PPO | Attending: Hematology and Oncology

## 2017-10-08 VITALS — BP 125/82 | HR 122 | Temp 98.4°F | Resp 18 | Ht 60.5 in | Wt 181.1 lb

## 2017-10-08 DIAGNOSIS — C9000 Multiple myeloma not having achieved remission: Secondary | ICD-10-CM | POA: Diagnosis not present

## 2017-10-08 DIAGNOSIS — F329 Major depressive disorder, single episode, unspecified: Secondary | ICD-10-CM | POA: Diagnosis not present

## 2017-10-08 DIAGNOSIS — Z9049 Acquired absence of other specified parts of digestive tract: Secondary | ICD-10-CM | POA: Diagnosis not present

## 2017-10-08 DIAGNOSIS — Z87891 Personal history of nicotine dependence: Secondary | ICD-10-CM | POA: Insufficient documentation

## 2017-10-08 DIAGNOSIS — E119 Type 2 diabetes mellitus without complications: Secondary | ICD-10-CM | POA: Diagnosis not present

## 2017-10-08 DIAGNOSIS — D649 Anemia, unspecified: Secondary | ICD-10-CM | POA: Diagnosis not present

## 2017-10-08 DIAGNOSIS — Z79899 Other long term (current) drug therapy: Secondary | ICD-10-CM | POA: Insufficient documentation

## 2017-10-08 DIAGNOSIS — I10 Essential (primary) hypertension: Secondary | ICD-10-CM | POA: Insufficient documentation

## 2017-10-08 DIAGNOSIS — K5900 Constipation, unspecified: Secondary | ICD-10-CM | POA: Diagnosis not present

## 2017-10-08 DIAGNOSIS — Z7982 Long term (current) use of aspirin: Secondary | ICD-10-CM | POA: Diagnosis not present

## 2017-10-08 DIAGNOSIS — K219 Gastro-esophageal reflux disease without esophagitis: Secondary | ICD-10-CM | POA: Insufficient documentation

## 2017-10-08 DIAGNOSIS — B1920 Unspecified viral hepatitis C without hepatic coma: Secondary | ICD-10-CM | POA: Diagnosis not present

## 2017-10-08 DIAGNOSIS — M797 Fibromyalgia: Secondary | ICD-10-CM | POA: Insufficient documentation

## 2017-10-08 DIAGNOSIS — Z5111 Encounter for antineoplastic chemotherapy: Secondary | ICD-10-CM | POA: Diagnosis not present

## 2017-10-08 DIAGNOSIS — F419 Anxiety disorder, unspecified: Secondary | ICD-10-CM | POA: Insufficient documentation

## 2017-10-08 DIAGNOSIS — D72819 Decreased white blood cell count, unspecified: Secondary | ICD-10-CM

## 2017-10-08 DIAGNOSIS — Z9011 Acquired absence of right breast and nipple: Secondary | ICD-10-CM | POA: Insufficient documentation

## 2017-10-08 DIAGNOSIS — Z96641 Presence of right artificial hip joint: Secondary | ICD-10-CM | POA: Diagnosis not present

## 2017-10-08 DIAGNOSIS — Z853 Personal history of malignant neoplasm of breast: Secondary | ICD-10-CM | POA: Diagnosis not present

## 2017-10-08 DIAGNOSIS — M1990 Unspecified osteoarthritis, unspecified site: Secondary | ICD-10-CM | POA: Diagnosis not present

## 2017-10-08 LAB — IRON AND TIBC
Iron: 64 ug/dL (ref 41–142)
Saturation Ratios: 16 % — ABNORMAL LOW (ref 21–57)
TIBC: 413 ug/dL (ref 236–444)
UIBC: 348 ug/dL

## 2017-10-08 LAB — CMP (CANCER CENTER ONLY)
ALBUMIN: 3.3 g/dL — AB (ref 3.5–5.0)
ALK PHOS: 90 U/L (ref 40–150)
ALT: 34 U/L (ref 0–55)
AST: 40 U/L — ABNORMAL HIGH (ref 5–34)
Anion gap: 12 — ABNORMAL HIGH (ref 3–11)
BUN: 10 mg/dL (ref 7–26)
CALCIUM: 9.1 mg/dL (ref 8.4–10.4)
CO2: 23 mmol/L (ref 22–29)
CREATININE: 0.73 mg/dL (ref 0.60–1.10)
Chloride: 104 mmol/L (ref 98–109)
GFR, Est AFR Am: >60 mL/min (ref >60)
GFR, Estimated: >60 mL/min (ref >60)
GLUCOSE: 145 mg/dL — AB (ref 70–140)
Potassium: 3.9 mmol/L (ref 3.5–5.1)
SODIUM: 139 mmol/L (ref 136–145)
Total Bilirubin: 0.3 mg/dL (ref 0.2–1.2)
Total Protein: 6.9 g/dL (ref 6.4–8.3)

## 2017-10-08 LAB — CBC WITH DIFFERENTIAL (CANCER CENTER ONLY)
BASOS PCT: 0 %
Basophils Absolute: 0 10*3/uL (ref 0.0–0.1)
Eosinophils Absolute: 0.1 10*3/uL (ref 0.0–0.5)
Eosinophils Relative: 2 %
HEMATOCRIT: 33 % — AB (ref 34.8–46.6)
Hemoglobin: 10.3 g/dL — ABNORMAL LOW (ref 11.6–15.9)
LYMPHS ABS: 2.2 10*3/uL (ref 0.9–3.3)
Lymphocytes Relative: 47 %
MCH: 29.8 pg (ref 25.1–34.0)
MCHC: 31.2 g/dL — AB (ref 31.5–36.0)
MCV: 95.4 fL (ref 79.5–101.0)
MONO ABS: 0.3 10*3/uL (ref 0.1–0.9)
MONOS PCT: 6 %
NEUTROS ABS: 2.1 10*3/uL (ref 1.5–6.5)
Neutrophils Relative %: 45 %
Platelet Count: 182 10*3/uL (ref 145–400)
RBC: 3.46 MIL/uL — ABNORMAL LOW (ref 3.70–5.45)
RDW: 16.8 % — AB (ref 11.2–14.5)
WBC Count: 4.6 10*3/uL (ref 3.9–10.3)

## 2017-10-08 LAB — FERRITIN: Ferritin: 33 ng/mL (ref 9–269)

## 2017-10-08 MED ORDER — DEXAMETHASONE 4 MG PO TABS: ORAL_TABLET | ORAL | 3 refills | Status: DC

## 2017-10-08 MED ORDER — PROCHLORPERAZINE MALEATE 10 MG PO TABS: 10.0000 mg | ORAL_TABLET | Freq: Four times a day (QID) | ORAL | 1 refills | Status: DC | PRN

## 2017-10-08 MED ORDER — LORAZEPAM 0.5 MG PO TABS: 0.5000 mg | ORAL_TABLET | Freq: Four times a day (QID) | ORAL | 0 refills | Status: DC | PRN

## 2017-10-08 MED ORDER — LENALIDOMIDE 25 MG PO CAPS: ORAL_CAPSULE | ORAL | 3 refills | Status: DC

## 2017-10-08 MED ORDER — ONDANSETRON HCL 8 MG PO TABS: 8.0000 mg | ORAL_TABLET | Freq: Two times a day (BID) | ORAL | 1 refills | Status: DC | PRN

## 2017-10-08 MED ORDER — LIDOCAINE-PRILOCAINE 2.5-2.5 % EX CREA: TOPICAL_CREAM | CUTANEOUS | 3 refills | Status: DC

## 2017-10-08 MED ORDER — ACYCLOVIR 400 MG PO TABS: 400.0000 mg | ORAL_TABLET | Freq: Two times a day (BID) | ORAL | 3 refills | Status: DC

## 2017-10-08 NOTE — Telephone Encounter (Signed)
Scheduled appt per  4/11 los - Gave patient aVS and calender per los.  

## 2017-10-08 NOTE — Progress Notes (Signed)
START ON PATHWAY REGIMEN - Multiple Myeloma and Other Plasma Cell Dyscrasias     A cycle is every 21 days:     Bortezomib      Lenalidomide      Dexamethasone   **Always confirm dose/schedule in your pharmacy ordering system**    Patient Characteristics: Newly Diagnosed, Transplant Eligible, Standard Risk R-ISS Staging: Unknown Disease Classification: Newly Diagnosed Is Patient Eligible for Transplant<= Transplant Eligible Risk Status: Standard Risk Intent of Therapy: Curative Intent, Discussed with Patient

## 2017-10-08 NOTE — Assessment & Plan Note (Signed)
67 y.o. female with chronic anemia and recent leukopenia leading to bone marrow biopsy which surprisingly revealed presence of a plasma cell neoplasm, most likely multiple myeloma.  There is a high percent of bone marrow replacement with a disease and it requires additional urgent assessment to properly stage the illness and to decide on the treatment strategy.  Based on patient's past medical history and current performance status, she would be a candidate for pursuing aggressive approach consisting of induction therapy with triple regimen followed by consolidation therapy with high-dose chemo and autologous stem cell rescue.  Current plan includes additional laboratory assessment as outlined below as well as imaging with PET/CT to assess for presence of significant skeletal abnormalities.  My preference is to treat patient with triple therapy combining lenalidomide, bortezomib, and low-dose dexamethasone with 3 cycles of 21-day therapy followed by restaging bone marrow biopsy and 1-3 additional cycles of treatment depending on the response.  Plan: -Multiple myeloma labs as outlined below. -PET/CT for disease staging. -Consult cancer center dentistry for dental assessment prior to initiation of zoledronic acid for skeletal support. -Patient has limited financial means and needs to be evaluated by our financial assistance and social work for possible need of assistance during the treatment. -Return to clinic in 2 weeks to possibly initiate RVD therapy. 

## 2017-10-08 NOTE — Progress Notes (Signed)
[facility] Follow-up Visit:  Assessment: Multiple myeloma not having achieved remission ([place]) 67 y.o. female with chronic anemia and recent leukopenia leading to bone marrow biopsy which surprisingly revealed presence of a plasma cell neoplasm, most likely multiple myeloma.  There is a high percent of bone marrow replacement with a disease and it requires additional urgent assessment to properly stage the illness and to decide on the treatment strategy.  Based on patient's past medical history and current performance status, she would be a candidate for pursuing aggressive approach consisting of induction therapy with triple regimen followed by consolidation therapy with high-dose chemo and autologous stem cell rescue.  Current plan includes additional laboratory assessment as outlined below as well as imaging with PET/CT to assess for presence of significant skeletal abnormalities.  My preference is to treat patient with triple therapy combining lenalidomide, bortezomib, and low-dose dexamethasone with 3 cycles of 21-day therapy followed by restaging bone marrow biopsy and 1-3 additional cycles of treatment depending on the response.  Plan: -Multiple myeloma labs as outlined below. -PET/CT for disease staging. -Consult cancer center dentistry for dental assessment prior to initiation of zoledronic acid for skeletal support. -Patient has limited financial means and needs to be evaluated by our financial assistance and social work for possible need of assistance during the treatment. -Return to clinic in 2 weeks to possibly initiate RVD therapy.   Voice recognition software was used and creation of this note. Despite my best effort at editing the text, some misspelling/errors may have occurred.  Orders Placed This Encounter  Procedures  . NM PET Image Initial (PI) Whole Body    Standing Status:   Future    Standing Expiration Date:   10/08/2018    Order Specific Question:    If indicated for the ordered procedure, I authorize the administration of a radiopharmaceutical per Radiology protocol    Answer:   Yes    Order Specific Question:   Preferred imaging location?    Answer:   [facility]    Order Specific Question:   Radiology Contrast Protocol - do NOT remove file path    Answer:   [email or web address]    Order Specific Question:   Reason for Exam additional comments    Answer:   New diagnosis of multiple myeloma  . Beta 2 microglobulin, serum  . Kappa/lambda light chains  . Lactate dehydrogenase  . SPEP & IFE with QIG  . UPEP/UIFE/Light Chains/TP, 24-Hr Ur  . Ambulatory referral to Dentistry    Referral Priority:   Routine    Referral Type:   Consultation    Referral Reason:   Specialty Services Required    Requested Specialty:   Dental General Practice    Number of Visits Requested:   1  . Ambulatory referral to Social Work    Referral Priority:   Routine    Referral Type:   Consultation    Referral Reason:   Specialty Services Required    Number of Visits Requested:   1  . PHYSICIAN COMMUNICATION ORDER    Order aspirin 81-338m OR Coumadin for thromboembolic prophylaxis via "add orders". Target Coumadin INR = 2-3.    Cancer Staging No matching staging information was found for the patient.  All questions were answered.  . The patient knows to call the clinic with any problems, questions or concerns.  This note was electronically signed.    History of Presenting Illness GNAILYN DEARINGERis a 67y.o. female followed  in the [facility] for neutropenia and iron deficiency anemia, referred by Dr Deland Pretty.  Patient's past medical history is significant for history of 2 episodes of breast cancer on the right side initially treated with lumpectomy followed by mastectomy and chemoradiotherapy administered by Dr. Jana Hakim due to cancer recurrence, history of hepatitis C, depression, hypertension, fibromyalgia,  insomnia.  Patient's previous surgeries include cholecystectomy, total abdominal hysterectomy with bilateral salpingo-oophorectomy and right total hip replacement in 2017.  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  Currently taking no over-the-counter medications or herbal supplements, also is not taking oral iron supplement due to significant constipation.  Family history significant for sister with breast cancer and factor V Leiden mutation.  Patient returns to the clinic to review the results of recent bone marrow biopsy.  At the present time, patient denies any fevers, chills, night sweats.  Denies any weakness or sensory deficits in the hands, or extremities.  Denies shortness of breath, chest pain or cough.  No nausea, vomiting, abdominal pain, diarrhea, constipation.  No dysuria or hematuria.  Patient denies hematochezia, melena, epistaxis, hemoptysis, or hematemesis..  Oncological/hematological History: --Labs, 03/10/14: WBC 7.8,                                                 Hgb 11.7, MCV 82.0, MCH 27.0, MCHC 32.9, RDW 14.9, Plt 240;                                                          Vit D-25OH 39.5 --Labs, 08/25/14: WBC 8.1,                                                 Hgb 10.3, MCV 80.5, MCH 24.5, MCHC 30.4, RDW 15.8, Plt 255;                                                          Vit D-25OH 48.4 --Labs, 04/10/16: WBC 6.7, ANC 3.1, ALC 3.0, Mono 0.5, Hgb   8.9, MCV 94.0, MCH 28.3, MCHC 30.2, RDW 14.0, Plt 266; Fe 46, FeSat 13%, TIBC 360, Transferrin 329 --Labs, 04/14/17: WBC 3.4,                                                 Hgb 10.9, MCV 89.7, MCH 28.7, MCHC 32.0, RDW 16.0, Plt 190; Fe 58, FeSat 13%, TIBC 448,        Vit D-25OH 29.0 --Labs, 05/13/17: FOBT -- negative x3 --Labs, 08/28/17: WBC 3.8, ANC 1.8, ALC 1.8, Mono 0.2, Hgb   9.7, MCV 91.8, MCH 29.3, MCHC 31.9, RDW 15.8, Plt 205; Fe 66, FeSat 14%, TIBC 476,  Ferritin  19 --Treatment:   --Ferrous gluconate 324m PO Qday-TID, 09/04/17-: Metamucil for constipation     Multiple myeloma not having achieved remission ([place]   09/22/2017 Initial Diagnosis    Multiple myeloma not having achieved remission ([place]      09/22/2017 Bone Marrow Biopsy    Pathology: Hypercellular bone marrow with marked elevation of kappa light chain restricted plasma cells accounting for 57% of cellularity. CytoGen: Normal cytogenetics FISH: Positive for-4,-14,13q-      10/08/2017 Tumor Marker    tProt 6.9, Alb 3.3, Ca 9.1, Cr 0.7, AP 90 LDH ..., beta-2 microglobulin ...; SPEP ...      10/21/2017 -  Chemotherapy    The patient had bortezomib SQ (VELCADE) chemo injection 2.5 mg, 1.3 mg/m2, Subcutaneous,  Once, 0 of 4 cycles  for chemotherapy treatment.        Medical History: Past Medical History:  Diagnosis Date  . Anemia    2016 on iron for a while   . Anxiety   . ARTHRITIS 10/25/2007  . Arthritis   . Blood transfusion without reported diagnosis   . BREAST CANCER, HX OF 08/03/2006  . Cancer ([place]    hx of breast ca x 2 on right   . DEPRESSION 08/03/2006  . Diabetes ([place]    prediabetic   . DIABETES MELLITUS, TYPE II 08/03/2006  . DYSPNEA ON EXERTION 11/21/2009   shortness of breath with exertion   . EPISTAXIS, RECURRENT 01/27/2008  . FIBROMYALGIA 01/13/2008  . GERD 04/30/2008  . HEPATITIS C 08/03/2006  . HIP PAIN, RIGHT 04/30/2007  . History of detached retina repair 02/12/2011  . HYPERTENSION 04/30/2008  . INSOMNIA-SLEEP DISORDER-UNSPEC 11/21/2009  . KNEE SPRAIN, ACUTE 01/27/2008  . LEG PAIN, LEFT 01/13/2008  . OSTEOPENIA 07/26/2007  . Persistent vomiting 08/17/2008  . Pneumonia    hx of   . Prediabetes     Surgical History: Past Surgical History:  Procedure Laterality Date  . ABDOMINAL HYSTERECTOMY    . BREAST LUMPECTOMY  2000   right with axillary LND  . CHOLECYSTECTOMY  06/05/2012   Procedure: LAPAROSCOPIC CHOLECYSTECTOMY;  Surgeon: SAdin Hector MD;   Location: [facility];  Service: General;  Laterality: N/A;  . LAPAROSCOPIC LYSIS OF ADHESIONS  06/05/2012   Procedure: LAPAROSCOPIC LYSIS OF ADHESIONS;  Surgeon: SAdin Hector MD;  Location: [facility];  Service: General;  Laterality: N/A;  . MASTECTOMY  2007   Dr. SMargot Chimes . s/p right broken leg with surgury as teen    . s/p thyroid FNA neg.    . TONSILLECTOMY    . TOTAL HIP ARTHROPLASTY Right 01/25/2016   Procedure: RIGHT TOTAL HIP ARTHROPLASTY ANTERIOR APPROACH;  Surgeon: CMcarthur Rossetti MD;  Location: [facility];  Service: Orthopedics;  Laterality: Right;  . TUBAL LIGATION      Family History: Family History  Problem Relation Age of Onset  . Heart disease Mother   . Cancer Sister        breast  . Depression Father   . Depression Brother     Social History: Social History   Socioeconomic History  . Marital status: Divorced    Spouse name: Not on file  . Number of children: Not on file  . Years of education: Not on file  . Highest education level: Not on file  Occupational History  . Occupation: MSports coach Social Needs  . Financial resource strain: Not on file  . Food insecurity:    Worry: Not on file  Inability: Not on file  . Transportation needs:    Medical: Not on file    Non-medical: Not on file  Tobacco Use  . Smoking status: Former Smoker    Packs/day: 4.00    Years: 40.00    Pack years: 160.00    Types: Cigarettes    Last attempt to quit: 06/30/2005    Years since quitting: 12.2  . Smokeless tobacco: Never Used  . Tobacco comment: Pt smoked up to 4 ppd  Substance and Sexual Activity  . Alcohol use: Yes    Alcohol/week: 0.0 oz    Comment: rare  . Drug use: No  . Sexual activity: Not Currently  Lifestyle  . Physical activity:    Days per week: Not on file    Minutes per session: Not on file  . Stress: Not on file  Relationships  . Social connections:    Talks on phone: Not on file    Gets together: Not on file    Attends religious service:  Not on file    Active member of club or organization: Not on file    Attends meetings of clubs or organizations: Not on file    Relationship status: Not on file  . Intimate partner violence:    Fear of current or ex partner: Not on file    Emotionally abused: Not on file    Physically abused: Not on file    Forced sexual activity: Not on file  Other Topics Concern  . Not on file  Social History Narrative  . Not on file    Allergies: Allergies  Allergen Reactions  . Ace Inhibitors Cough  . Contrast Media [Iodinated Diagnostic Agents]     Hallucinations  . Iohexol      Desc: Pt has been pre medicated with prednisone before scans, pt is unsure of type of contrast she reacted to.   . Tape Rash    "Tears my skin all up"    Medications:  Current Outpatient Medications  Medication Sig Dispense Refill  . amitriptyline (ELAVIL) 25 MG tablet Take 1 tablet (25 mg total) by mouth at bedtime. 90 tablet 1  . ARIPiprazole (ABILIFY) 2 MG tablet Take 1 tablet (2 mg total) by mouth daily. Start in the morning 90 tablet 1  . cholestyramine (QUESTRAN) 4 GM/DOSE powder Take 1 packet (4 g total) by mouth 2 (two) times daily with a meal. 378 g 11  . diphenhydrAMINE (BENADRYL) 25 MG tablet Take 25 mg by mouth every 6 (six) hours as needed.    . DULoxetine (CYMBALTA) 30 MG capsule Take 1 capsule (30 mg total) by mouth daily. Take with 60 mg capsule for a total of 90 mg daily 90 capsule 3  . DULoxetine (CYMBALTA) 60 MG capsule Take 1 capsule (60 mg total) by mouth daily. Take with 30 mg capsule for a total of 90 mg daily 90 capsule 3  . feeding supplement, ENSURE ENLIVE, (ENSURE ENLIVE) LIQD Take 237 mLs by mouth daily.    Marland Kitchen gabapentin (NEURONTIN) 300 MG capsule Take 2 capsules (600 mg total) by mouth 4 (four) times daily. 720 capsule 3  . HYDROcodone-acetaminophen (NORCO/VICODIN) 5-325 MG tablet Take 5-325 tablets by mouth 3 (three) times daily.    . methocarbamol (ROBAXIN) 500 MG tablet Take 500 mg  by mouth 3 (three) times daily.    . Multiple Vitamins-Minerals (MULTIVITAMIN WITH MINERALS) tablet Take 1 tablet by mouth daily.    Marland Kitchen omeprazole (PRILOSEC) 20 MG capsule Take 2  capsules (40 mg total) by mouth daily. (Patient taking differently: Take 20 mg by mouth 2 (two) times daily. ) 180 capsule 3  . psyllium (REGULOID) 0.52 g capsule Take 0.52 g by mouth daily.    . temazepam (RESTORIL) 30 MG capsule Take 1 capsule (30 mg total) by mouth Nightly. 90 capsule 1  . acyclovir (ZOVIRAX) 400 MG tablet Take 1 tablet (400 mg total) by mouth 2 (two) times daily. 60 tablet 3  . dexamethasone (DECADRON) 4 MG tablet Take 2 and a half tablets (10 mg) on the day before and the day of Velcade chemotherapy. Repeat every 21 days. 20 tablet 3  . ferrous gluconate (FERGON) 324 MG tablet Take 1 tablet (324 mg total) by mouth 3 (three) times daily with meals. 90 tablet 0  . lenalidomide (REVLIMID) 25 MG capsule Take 1 capsule daily on days 1-14 every 21 days. 14 capsule 3  . lidocaine-prilocaine (EMLA) cream Apply to affected area once 30 g 3  . LORazepam (ATIVAN) 0.5 MG tablet Take 1 tablet (0.5 mg total) by mouth every 6 (six) hours as needed (Nausea or vomiting). 30 tablet 0  . ondansetron (ZOFRAN) 8 MG tablet Take 1 tablet (8 mg total) by mouth 2 (two) times daily as needed (Nausea or vomiting). 30 tablet 1  . prochlorperazine (COMPAZINE) 10 MG tablet Take 1 tablet (10 mg total) by mouth every 6 (six) hours as needed (Nausea or vomiting). 30 tablet 1   No current facility-administered medications for this visit.     Review of Systems: Review of Systems  All other systems reviewed and are negative.    PHYSICAL EXAMINATION Blood pressure 125/82, pulse (!) 122, temperature 98.4 F (36.9 C), temperature source Oral, resp. rate 18, height 5' 0.5" (1.537 m), weight 181 lb 1.6 oz (82.1 kg), SpO2 97 %.  ECOG PERFORMANCE STATUS: 2 - Symptomatic, <50% confined to bed  Physical Exam  Constitutional: She is  oriented to person, place, and time and well-developed, well-nourished, and in no distress. No distress.  HENT:  Head: Normocephalic and atraumatic.  Mouth/Throat: Oropharynx is clear and moist. No oropharyngeal exudate.  Eyes: Pupils are equal, round, and reactive to light. Conjunctivae and EOM are normal. No scleral icterus.  Neck: No thyromegaly present.  Cardiovascular: Normal rate, regular rhythm and normal heart sounds.  No murmur heard. Pulmonary/Chest: Effort normal and breath sounds normal. No respiratory distress. She has no wheezes. She has no rales.  Abdominal: Soft. Bowel sounds are normal. She exhibits no distension and no mass. There is no tenderness. There is no guarding.  Musculoskeletal: She exhibits no edema.  Lymphadenopathy:    She has no cervical adenopathy.  Neurological: She is alert and oriented to person, place, and time. She has normal reflexes. No cranial nerve deficit.  Skin: Skin is warm and dry. No rash noted. She is not diaphoretic. No erythema. No pallor.     LABORATORY DATA: I have personally reviewed the data as listed: Appointment on 10/08/2017  Component Date Value Ref Range Status  . Ferritin 10/08/2017 33  9 - 269 ng/mL Final   Performed at [facility], [place] [address] [place], [place] [address]  . Iron 10/08/2017 64  41 - 142 ug/dL Final  . TIBC 10/08/2017 413  236 - 444 ug/dL Final  . Saturation Ratios 10/08/2017 16* 21 - 57 % Final  . UIBC 10/08/2017 348  ug/dL Final   Performed at [facility], [place]  [address] [place], [place] [address]  . Sodium 10/08/2017 139  136 - 145 mmol/L Final  . Potassium 10/08/2017 3.9  3.5 - 5.1 mmol/L Final  . Chloride 10/08/2017 104  98 - 109 mmol/L Final  . CO2 10/08/2017 23  22 - 29 mmol/L Final  . Glucose, Bld 10/08/2017 145* 70 - 140 mg/dL Final  . BUN 10/08/2017 10  7 - 26 mg/dL Final  . Creatinine 10/08/2017 0.73  0.60 - 1.10 mg/dL Final  . Calcium  10/08/2017 9.1  8.4 - 10.4 mg/dL Final  . Total Protein 10/08/2017 6.9  6.4 - 8.3 g/dL Final  . Albumin 10/08/2017 3.3* 3.5 - 5.0 g/dL Final  . AST 10/08/2017 40* 5 - 34 U/L Final  . ALT 10/08/2017 34  0 - 55 U/L Final  . Alkaline Phosphatase 10/08/2017 90  40 - 150 U/L Final  . Total Bilirubin 10/08/2017 0.3  0.2 - 1.2 mg/dL Final  . GFR, Est Non Af Am 10/08/2017 >60  >60 mL/min Final  . GFR, Est AFR Am 10/08/2017 >60  >60 mL/min Final   Comment: (NOTE) The eGFR has been calculated using the CKD EPI equation. This calculation has not been validated in all clinical situations. eGFR's persistently <60 mL/min signify possible Chronic Kidney Disease.   Georgiann Hahn gap 10/08/2017 12* 3 - 11 Final   Performed at [facility], [place] [address], [place], [place] [address]  . WBC Count 10/08/2017 4.6  3.9 - 10.3 K/uL Final  . RBC 10/08/2017 3.46* 3.70 - 5.45 MIL/uL Final  . Hemoglobin 10/08/2017 10.3* 11.6 - 15.9 g/dL Final  . HCT 10/08/2017 33.0* 34.8 - 46.6 % Final  . MCV 10/08/2017 95.4  79.5 - 101.0 fL Final  . MCH 10/08/2017 29.8  25.1 - 34.0 pg Final  . MCHC 10/08/2017 31.2* 31.5 - 36.0 g/dL Final  . RDW 10/08/2017 16.8* 11.2 - 14.5 % Final  . Platelet Count 10/08/2017 182  145 - 400 K/uL Final  . Neutrophils Relative % 10/08/2017 45  % Final  . Neutro Abs 10/08/2017 2.1  1.5 - 6.5 K/uL Final  . Lymphocytes Relative 10/08/2017 47  % Final  . Lymphs Abs 10/08/2017 2.2  0.9 - 3.3 K/uL Final  . Monocytes Relative 10/08/2017 6  % Final  . Monocytes Absolute 10/08/2017 0.3  0.1 - 0.9 K/uL Final  . Eosinophils Relative 10/08/2017 2  % Final  . Eosinophils Absolute 10/08/2017 0.1  0.0 - 0.5 K/uL Final  . Basophils Relative 10/08/2017 0  % Final  . Basophils Absolute 10/08/2017 0.0  0.0 - 0.1 K/uL Final   Performed at [facility], [place] [address], [place],  [address]       Ardath Sax, MD

## 2017-10-09 ENCOUNTER — Telehealth: Payer: Self-pay | Admitting: *Deleted

## 2017-10-09 ENCOUNTER — Encounter: Payer: Self-pay | Admitting: Hematology and Oncology

## 2017-10-09 LAB — LACTATE DEHYDROGENASE: LDH: 151 U/L (ref 125–245)

## 2017-10-09 LAB — BETA 2 MICROGLOBULIN, SERUM: BETA 2 MICROGLOBULIN: 2.8 mg/L — AB (ref 0.6–2.4)

## 2017-10-09 LAB — KAPPA/LAMBDA LIGHT CHAINS
KAPPA FREE LGHT CHN: 1230.9 mg/L — AB (ref 3.3–19.4)
Kappa, lambda light chain ratio: 195.38 — ABNORMAL HIGH (ref 0.26–1.65)
LAMDA FREE LIGHT CHAINS: 6.3 mg/L (ref 5.7–26.3)

## 2017-10-09 NOTE — Progress Notes (Signed)
Patient called wanting to schedule appointment to discuss financial assistance for PET scan and etc. Advised patient that there is nor financial assistance available through [place] for Pet scans. Advised that once scheduled, someone would contact her regarding her responsibility after insurance and she would discuss arrangements with them as far as payment/cost for Pet scans.  Reviewed her chart with diagnosis and treatment plan and noticed there is available funding through [employer] for her diagnosis. Advised patient of this resource and what information would be needed to complete and submit application through them online. Patient gave me her verbal income information and household size. Advised patient I can apply on her behalf if that was ok and she gave me permission.  Advised patient with her verbal income, she does qualify for the one-time $400 [place] and explained what it helps cover. Advised her that proof of income would be needed to apply. She verbalized understanding. Asked patient if she would like to meet with me on 10/13/17 after her chemo ed class. She states that would be fine. Advised her what to bring to this appointment and that I would also have the information from [employer] to discuss as well. She confirmed we could meet then and what to bring. She has my card which was previously given by the RN for any additional financial questions or concerns.  Started enrollment application for [employer] online. Called patient back to obtain a few answers which she answered. Completed application online. [employer] will notify me via fax of decision. I will discuss all with patient at our appointment on 10/13/17.

## 2017-10-09 NOTE — Telephone Encounter (Signed)
"  Several questions have come up.  Would like to see doctor fifteen to thirty minutes.  If someone could see me sooner, before infusion on 10-23-2017.  I'm available any day except PET scan next Friday."

## 2017-10-12 ENCOUNTER — Inpatient Hospital Stay (HOSPITAL_BASED_OUTPATIENT_CLINIC_OR_DEPARTMENT_OTHER): Payer: PPO | Admitting: Hematology and Oncology

## 2017-10-12 ENCOUNTER — Telehealth: Payer: Self-pay | Admitting: Hematology and Oncology

## 2017-10-12 VITALS — BP 124/74 | HR 114 | Temp 98.7°F | Resp 17 | Ht 60.5 in | Wt 179.0 lb

## 2017-10-12 DIAGNOSIS — Z9011 Acquired absence of right breast and nipple: Secondary | ICD-10-CM | POA: Diagnosis not present

## 2017-10-12 DIAGNOSIS — D649 Anemia, unspecified: Secondary | ICD-10-CM | POA: Diagnosis not present

## 2017-10-12 DIAGNOSIS — Z5111 Encounter for antineoplastic chemotherapy: Secondary | ICD-10-CM | POA: Diagnosis not present

## 2017-10-12 DIAGNOSIS — Z79899 Other long term (current) drug therapy: Secondary | ICD-10-CM

## 2017-10-12 DIAGNOSIS — C9000 Multiple myeloma not having achieved remission: Secondary | ICD-10-CM

## 2017-10-12 DIAGNOSIS — Z853 Personal history of malignant neoplasm of breast: Secondary | ICD-10-CM | POA: Diagnosis not present

## 2017-10-12 LAB — CHROMOSOME ANALYSIS, BONE MARROW

## 2017-10-12 LAB — TISSUE HYBRIDIZATION (BONE MARROW)-NCBH

## 2017-10-12 NOTE — Telephone Encounter (Signed)
Per 4/15 los - --RTC as prev scheduled

## 2017-10-13 ENCOUNTER — Other Ambulatory Visit: Payer: Self-pay

## 2017-10-13 ENCOUNTER — Inpatient Hospital Stay: Payer: PPO

## 2017-10-13 ENCOUNTER — Encounter: Payer: Self-pay | Admitting: Hematology and Oncology

## 2017-10-13 DIAGNOSIS — C9000 Multiple myeloma not having achieved remission: Secondary | ICD-10-CM

## 2017-10-13 NOTE — Telephone Encounter (Signed)
Authorization number for Revlimid prescription is [id number].

## 2017-10-13 NOTE — Progress Notes (Signed)
Patient brought in proof of income for one-time $400 [place] and [employer] copay assistance.  Patient approved for the one-time $400 [place]. Patient has a copy of the approval letter as well as the expense sheet along with the [facility] pharmacy information. Patient received a gas card today from her grant.  Attempted to apply for copay assistance through [employer]. Patient states her ride has to go to an appointment. Advised her that I have everything that I need from her and would complete and call her once a decision has been made. She has my card for any additional financial questions or concerns.  Completed application online through [employer] portal. Obtained physician signature. Uploaded signed documents via [employer] portal.

## 2017-10-14 ENCOUNTER — Encounter (HOSPITAL_COMMUNITY): Payer: Self-pay | Admitting: Dentistry

## 2017-10-14 ENCOUNTER — Telehealth: Payer: Self-pay

## 2017-10-14 ENCOUNTER — Ambulatory Visit (HOSPITAL_COMMUNITY): Payer: Self-pay | Admitting: Dentistry

## 2017-10-14 ENCOUNTER — Encounter: Payer: Self-pay | Admitting: Hematology and Oncology

## 2017-10-14 VITALS — BP 136/75 | HR 111 | Temp 98.3°F

## 2017-10-14 DIAGNOSIS — M2630 Unspecified anomaly of tooth position of fully erupted tooth or teeth: Secondary | ICD-10-CM

## 2017-10-14 DIAGNOSIS — K0530 Chronic periodontitis, unspecified: Secondary | ICD-10-CM

## 2017-10-14 DIAGNOSIS — K08409 Partial loss of teeth, unspecified cause, unspecified class: Secondary | ICD-10-CM

## 2017-10-14 DIAGNOSIS — Z01818 Encounter for other preprocedural examination: Secondary | ICD-10-CM

## 2017-10-14 DIAGNOSIS — C9000 Multiple myeloma not having achieved remission: Secondary | ICD-10-CM

## 2017-10-14 DIAGNOSIS — K036 Deposits [accretions] on teeth: Secondary | ICD-10-CM

## 2017-10-14 DIAGNOSIS — K030 Excessive attrition of teeth: Secondary | ICD-10-CM

## 2017-10-14 DIAGNOSIS — M264 Malocclusion, unspecified: Secondary | ICD-10-CM

## 2017-10-14 DIAGNOSIS — M2632 Excessive spacing of fully erupted teeth: Secondary | ICD-10-CM

## 2017-10-14 DIAGNOSIS — K0889 Other specified disorders of teeth and supporting structures: Secondary | ICD-10-CM

## 2017-10-14 DIAGNOSIS — K06010 Localized gingival recession, unspecified: Secondary | ICD-10-CM

## 2017-10-14 LAB — UPEP/UIFE/LIGHT CHAINS/TP, 24-HR UR
% BETA, URINE: 21.1 %
ALBUMIN, U: 18.4 %
ALPHA 1 URINE: 7.2 %
ALPHA 2 UR: 18.3 %
FREE KAPPA/LAMBDA RATIO: 88.03 — AB (ref 2.04–10.37)
Free Kappa Lt Chains,Ur: 66.9 mg/L — ABNORMAL HIGH (ref 1.35–24.19)
Free Lambda Lt Chains,Ur: 0.76 mg/L (ref 0.24–6.66)
GAMMA GLOBULIN URINE: 35 %
M-SPIKE %, URINE: 14.9 % — AB
M-Spike, Mg/24 Hr: 15 mg/24 hr — ABNORMAL HIGH
TOTAL PROTEIN, URINE-UPE24: 6 mg/dL
Total Protein, Urine-Ur/day: 99 mg/24 hr (ref 30–150)
Total Volume: 1650

## 2017-10-14 NOTE — Progress Notes (Signed)
Reviewed [employer] portal today for status of submitted application on yesterday.  Patient approved for copay assistance 10/14/17 - 09/28/18 with a look-back date of 06/30/17. The approval amount is up to $11,000 to help pay for insurance premiums and qualifying treatment expenses.

## 2017-10-14 NOTE — Telephone Encounter (Signed)
Patient called and asked if she could apply EMLA cream prior to receiving her Velcade injection next week. Called pharmacy and pharmacist advised against it. Pharmacist suggested patient get first injection and see how she tolerates it and then reassess from there. Patient made aware and verbalized understanding.

## 2017-10-14 NOTE — Progress Notes (Signed)
DENTAL CONSULTATION  Date of Consultation:  10/14/2017 Patient Name:   [name] Date of Birth:   [birth year] Medical Record Number: [id number]  VITALS: BP 136/75 (BP Location: Left Arm)   Pulse (!) 111   Temp 98.3 F (36.8 C)   CHIEF COMPLAINT: Patient was referred by Dr. Lebron Conners for a dental consultation.  HPI: [name]  Is a 67 year old female recently diagnosed with multiple myeloma. Patient with anticipated use of Zometa therapy. Patient is now seen as part of a medically necessary pre-Zometa dental protocol examination.  Patient currently denies acute toothaches, swellings, or abscesses. Patient was last seen approximately 5-6 years ago for an exam, cleaning, dental radiographs. This was by a general dentist in [facility]. The patient cannot remember the name of that dentist.the patient does not seek regular dental care. The patient denies having any partial dentures. The patient denies having dental phobia.  PROBLEM LIST: Patient Active Problem List   Diagnosis Date Noted  . Multiple myeloma not having achieved remission ([place]) 10/08/2017    Priority: High  . Leukopenia 09/08/2017  . Anemia 09/08/2017  . Major depressive disorder, recurrent episode, moderate ([place]) 05/07/2016  . Elevated CK   . Moderate protein-calorie malnutrition ([place])   . Aspiration pneumonia ([place])   . Major depressive disorder, recurrent episode, severe ([place]) 04/05/2016  . Intentional drug overdose ([place]) 04/04/2016  . Suicidal ideation 04/04/2016  . Non-traumatic rhabdomyolysis 04/04/2016  . Osteoarthritis of right hip 01/25/2016  . 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 Cough 05/17/2014  . Lower back pain 04/19/2013  . Obesity (BMI 30-39.9) 06/14/2012  . Acute cholecystitis with chronic cholecystitis 06/04/2012  . 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 Chronic diarrhea 06/04/2012  . Poor venous access 06/04/2012  . Hemangioma of liver - left  hepatic lobe 06/04/2012  . Right hip pain 09/12/2011  . History of detached retina repair 02/12/2011  . Preventative health care 02/08/2011  . Psychophysiological insomnia 11/21/2009  . DOE (dyspnea on exertion) 11/21/2009  . Essential hypertension 04/30/2008  . GERD 04/30/2008  . EPISTAXIS, RECURRENT 01/27/2008  . KNEE SPRAIN, ACUTE 01/27/2008  . FIBROMYALGIA 01/13/2008  . LEG PAIN, LEFT 01/13/2008  . Arthritis of right hip 10/25/2007  . OSTEOPENIA 07/26/2007  . HIP PAIN, RIGHT 04/30/2007  . HEPATITIS C 08/03/2006  . Diabetes ([place]) 08/03/2006  . Major depressive disorder, single episode 08/03/2006  . BREAST CANCER, HX OF 08/03/2006    PMH: Past Medical History:  Diagnosis Date  . Anemia    2016 on iron for a while   . Anxiety   . ARTHRITIS 10/25/2007  . Arthritis   . Blood transfusion without reported diagnosis   . BREAST CANCER, HX OF 08/03/2006  . Cancer ([place])    hx of breast ca x 2 on right   . DEPRESSION 08/03/2006  . Diabetes ([place])    prediabetic   . DIABETES MELLITUS, TYPE II 08/03/2006  . DYSPNEA ON EXERTION 11/21/2009   shortness of breath with exertion   . EPISTAXIS, RECURRENT 01/27/2008  . FIBROMYALGIA 01/13/2008  . GERD 04/30/2008  . HEPATITIS C 08/03/2006  . HIP PAIN, RIGHT 04/30/2007  . History of detached retina repair 02/12/2011  . HYPERTENSION 04/30/2008  . INSOMNIA-SLEEP DISORDER-UNSPEC 11/21/2009  . KNEE SPRAIN, ACUTE 01/27/2008  . LEG PAIN, LEFT 01/13/2008  . OSTEOPENIA 07/26/2007  . Persistent vomiting 08/17/2008  . Pneumonia    hx of   . Prediabetes  PSH: Past Surgical History:  Procedure Laterality Date  . ABDOMINAL HYSTERECTOMY    . BREAST LUMPECTOMY  2000   right with axillary LND  . CHOLECYSTECTOMY  06/05/2012   Procedure: LAPAROSCOPIC CHOLECYSTECTOMY;  Surgeon: Adin Hector, MD;  Location: [facility];  Service: General;  Laterality: N/A;  . LAPAROSCOPIC LYSIS OF ADHESIONS  06/05/2012   Procedure: LAPAROSCOPIC LYSIS OF ADHESIONS;  Surgeon: Adin Hector, MD;  Location: [facility];  Service: General;  Laterality: N/A;  . MASTECTOMY  2007   Dr. Margot Chimes  . s/p right broken leg with surgury as teen    . s/p thyroid FNA neg.    . TONSILLECTOMY    . TOTAL HIP ARTHROPLASTY Right 01/25/2016   Procedure: RIGHT TOTAL HIP ARTHROPLASTY ANTERIOR APPROACH;  Surgeon: Mcarthur Rossetti, MD;  Location: [facility];  Service: Orthopedics;  Laterality: Right;  . TUBAL LIGATION      ALLERGIES: Allergies  Allergen Reactions  . Ace Inhibitors Cough  . Contrast Media [Iodinated Diagnostic Agents]     Hallucinations  . Iohexol      Desc: Pt has been pre medicated with prednisone before scans, pt is unsure of type of contrast she reacted to.   . Tape Rash    "Tears my skin all up"    MEDICATIONS: Current Outpatient Medications  Medication Sig Dispense Refill  . amitriptyline (ELAVIL) 25 MG tablet Take 1 tablet (25 mg total) by mouth at bedtime. 90 tablet 1  . ARIPiprazole (ABILIFY) 2 MG tablet Take 1 tablet (2 mg total) by mouth daily. Start in the morning 90 tablet 1  . cholestyramine (QUESTRAN) 4 GM/DOSE powder Take 1 packet (4 g total) by mouth 2 (two) times daily with a meal. 378 g 11  . DULoxetine (CYMBALTA) 30 MG capsule Take 1 capsule (30 mg total) by mouth daily. Take with 60 mg capsule for a total of 90 mg daily 90 capsule 3  . DULoxetine (CYMBALTA) 60 MG capsule Take 1 capsule (60 mg total) by mouth daily. Take with 30 mg capsule for a total of 90 mg daily 90 capsule 3  . gabapentin (NEURONTIN) 300 MG capsule Take 2 capsules (600 mg total) by mouth 4 (four) times daily. 720 capsule 3  . Multiple Vitamins-Minerals (MULTIVITAMIN WITH MINERALS) tablet Take 1 tablet by mouth daily.    Marland Kitchen omeprazole (PRILOSEC) 20 MG capsule Take 2 capsules (40 mg total) by mouth daily. (Patient taking differently: Take 20 mg by mouth 2 (two) times daily. ) 180 capsule 3  . oxyCODONE-acetaminophen (PERCOCET) 10-325 MG tablet Take 1 tablet by mouth 5 (five) times  daily.    . temazepam (RESTORIL) 30 MG capsule Take 1 capsule (30 mg total) by mouth Nightly. 90 capsule 1  . acyclovir (ZOVIRAX) 400 MG tablet Take 1 tablet (400 mg total) by mouth 2 (two) times daily. (Patient not taking: Reported on 10/14/2017) 60 tablet 3  . dexamethasone (DECADRON) 4 MG tablet Take 2 and a half tablets (10 mg) on the day before and the day of Velcade chemotherapy. Repeat every 21 days. (Patient not taking: Reported on 10/14/2017) 20 tablet 3  . diphenhydrAMINE (BENADRYL) 25 MG tablet Take 25 mg by mouth every 6 (six) hours as needed.    . feeding supplement, ENSURE ENLIVE, (ENSURE ENLIVE) LIQD Take 237 mLs by mouth daily. (Patient not taking: Reported on 10/14/2017)    . ferrous gluconate (FERGON) 324 MG tablet Take 1 tablet (324 mg total) by mouth 3 (three) times  daily with meals. 90 tablet 0  . lenalidomide (REVLIMID) 25 MG capsule Take 1 capsule daily on days 1-14 every 21 days. (Patient not taking: Reported on 10/14/2017) 14 capsule 3  . lidocaine-prilocaine (EMLA) cream Apply to affected area once (Patient not taking: Reported on 10/14/2017) 30 g 3  . LORazepam (ATIVAN) 0.5 MG tablet Take 1 tablet (0.5 mg total) by mouth every 6 (six) hours as needed (Nausea or vomiting). (Patient not taking: Reported on 10/14/2017) 30 tablet 0  . methocarbamol (ROBAXIN) 500 MG tablet Take 500 mg by mouth 3 (three) times daily.    . ondansetron (ZOFRAN) 8 MG tablet Take 1 tablet (8 mg total) by mouth 2 (two) times daily as needed (Nausea or vomiting). (Patient not taking: Reported on 10/14/2017) 30 tablet 1  . prochlorperazine (COMPAZINE) 10 MG tablet Take 1 tablet (10 mg total) by mouth every 6 (six) hours as needed (Nausea or vomiting). (Patient not taking: Reported on 10/14/2017) 30 tablet 1  . psyllium (REGULOID) 0.52 g capsule Take 0.52 g by mouth daily.     No current facility-administered medications for this visit.     LABS: Lab Results  Component Value Date   WBC 4.6 10/08/2017   HGB  9.7 (L) 09/22/2017   HCT 33.0 (L) 10/08/2017   MCV 95.4 10/08/2017   PLT 182 10/08/2017      Component Value Date/Time   NA 139 10/08/2017 1421   K 3.9 10/08/2017 1421   CL 104 10/08/2017 1421   CO2 23 10/08/2017 1421   GLUCOSE 145 (H) 10/08/2017 1421   GLUCOSE 108 (H) 04/30/2006 1316   BUN 10 10/08/2017 1421   CREATININE 0.73 10/08/2017 1421   CALCIUM 9.1 10/08/2017 1421   GFRNONAA >60 10/08/2017 1421   GFRAA >60 10/08/2017 1421   Lab Results  Component Value Date   INR 0.92 09/22/2017   No results found for: PTT  SOCIAL HISTORY: Social History   Socioeconomic History  . Marital status: Divorced    Spouse name: Not on file  . Number of children: Not on file  . Years of education: Not on file  . Highest education level: Not on file  Occupational History  . Occupation: Sports coach  Social Needs  . Financial resource strain: Not on file  . Food insecurity:    Worry: Not on file    Inability: Not on file  . Transportation needs:    Medical: Not on file    Non-medical: Not on file  Tobacco Use  . Smoking status: Former Smoker    Packs/day: 4.00    Years: 40.00    Pack years: 160.00    Types: Cigarettes    Last attempt to quit: 06/30/2005    Years since quitting: 12.2  . Smokeless tobacco: Never Used  . Tobacco comment: Pt smoked up to 4 ppd  Substance and Sexual Activity  . Alcohol use: Yes    Alcohol/week: 0.0 oz    Comment: rare  . Drug use: No  . Sexual activity: Not Currently  Lifestyle  . Physical activity:    Days per week: Not on file    Minutes per session: Not on file  . Stress: Not on file  Relationships  . Social connections:    Talks on phone: Not on file    Gets together: Not on file    Attends religious service: Not on file    Active member of club or organization: Not on file    Attends meetings  of clubs or organizations: Not on file    Relationship status: Not on file  . Intimate partner violence:    Fear of current or ex partner:  Not on file    Emotionally abused: Not on file    Physically abused: Not on file    Forced sexual activity: Not on file  Other Topics Concern  . Not on file  Social History Narrative  . Not on file    FAMILY HISTORY: Family History  Problem Relation Age of Onset  . Heart disease Mother   . Cancer Sister        breast  . Depression Father   . Depression Brother     REVIEW OF SYSTEMS: Reviewed with the patient as per History of present illness. Psych: Patient denies having dental phobia.  DENTAL HISTORY: CHIEF COMPLAINT: Patient was referred by Dr. Lebron Conners for a dental consultation.  HPI: [name]  Is a 67 year old female recently diagnosed with multiple myeloma. Patient with anticipated use of Zometa therapy. Patient is now seen as part of a medically necessary pre-Zometa dental protocol examination.  Patient currently denies acute toothaches, swellings, or abscesses. Patient was last seen approximately 5-6 years ago for an exam, cleaning, dental radiographs. This was by a general dentist in [facility]. The patient cannot remember the name of that dentist.the patient does not seek regular dental care. The patient denies having any partial dentures. The patient denies having dental phobia.  DENTAL EXAMINATION: GENERAL:  The patient is a well-developed, well-nourished female in no acute distress. HEAD AND NECK:  There is no palpable neck lymphadenopathy. The patient denies acute TMJ symptoms. INTRAORAL EXAM:  The patient has normal saliva. Patient has a mid palatal torus. The patient has bilateral maxillary palatal exostoses in the area of tooth numbers 1 through 3 and 14 through 16. DENTITION:  The patient is missing tooth numbers 1, 16, 17, and 32. Multiple diastemas are noted. Multiple malpositioned teeth are noted. There is evidence of maxillary and mandibular incisal attrition. PERIODONTAL:  The patient has chronic periodontitis with plaque and calculus  accumulations, generalized gingival recession, and incipient to moderate bone loss. There is incipient mandibular anterior tooth mobility.  Significant calculus is noted. Periodontal charting was deferred secondary to the gross amounts of calculus. There is evidence of lack of attached gingiva in the mandibualr anterior area #22-27. DENTAL CARIES/SUBOPTIMAL RESTORATIONS:  No obvious dental caries are noted. ENDODONTIC:  Patient currently denies acute pulpitis symptoms. I do not see any evidence of periapical pathology. CROWN AND BRIDGE:  There is a PFM crown on tooth #14. PROSTHODONTIC: patient denies having partial dentures. OCCLUSION:  Patient has a poor occlusal scheme secondary to missing teeth, multiple diastemas, and multiple malpositioned teeth.  RADIOGRAPHIC INTERPRETATION: Orthopantogram was taken and supplemented with a full series of dental radiographs. There are missing tooth numbers 1, 16, 17, and 32. Multiple diastemas are noted. There is incipient to moderate bone loss. Radiographic calculus is noted. Multiple amalgam restorations are noted. There is a PFM crown on tooth #14. Bilateral maxillary sinuses are noted.  ASSESSMENTS: 1. Multiple myeloma 2.  Pre-Zometa therapy dental protocol 3. Chronic periodontitis of bone loss 4. Accretions 5. Gingival recession 6. Mandibular anterior incipient tooth mobility 7. Mid palatal torus 8. Bilateral maxillary palatal exostoses 9. Maxillary and mandibular incisal attrition 10. Multiple malpositioned teeth 11. Multiple missing teeth 12. Multiple diastemas 13. Poor occlusal scheme and malocclusion #14. Anticipated use of Zometa with future risk for osteonecrosis the  jaw related to invasive dental procedures 15. Status post total hip replacement with no need for antibiotic premedication prior to invasive dental procedures per orthopedic surgeon, Dr. Ninfa Linden.   PLAN/RECOMMENDATIONS: 1. I discussed the risks, benefits, and complications  of various treatment options with the patient in relationship to her medical and dental conditions, anticipated use of Zometa, and the risk for future osteonecrosis of the jaw with invasive dental procedures. We discussed various treatment options to include no treatment, multiple extractions with alveoloplasty, pre-prosthetic surgery as indicated, periodontal therapy, dental restorations, root canal therapy, crown and bridge therapy, implant therapy, and replacement of missing teeth as indicated. The patient currently is adamant about not wanting any dental extractions at this time.  Patient did agree to proceed with gross debridement periodontal therapy this coming Monday, 10/19/2017 at 1 PM. The patient will then need to follow-up with a general dentist of her choice for continued periodontal maintenance and overall dental care.  Patient will then be cleared to proceed with chemotherapy with Dr. Lebron Conners and future use of Zometa therapy as indicated.  2. Discussion of findings with medical team and coordination of future medical and dental care as needed.  I spent in excess of  120 minutes during the conduct of this consultation and >50% of this time involved direct face-to-face encounter for counseling and/or coordination of the patient's care.    Lenn Cal, DDS

## 2017-10-14 NOTE — Patient Instructions (Signed)
Return to clinic for periodontal therapy on Monday, 10/19/2017 and  1 PM.  Dr. Enrique Sack

## 2017-10-15 ENCOUNTER — Telehealth: Payer: Self-pay | Admitting: Pharmacist

## 2017-10-15 DIAGNOSIS — C9000 Multiple myeloma not having achieved remission: Secondary | ICD-10-CM

## 2017-10-15 NOTE — Telephone Encounter (Signed)
Oral Oncology Pharmacist Encounter  Received new prescription for Revlimid (lenalidomide) for the treatment of newly diagnosed multiple myeloma in conjunction with dexamethasone and Velcade, planned duration 3 cycles of 21-day therapy followed by restaging bone marrow biopsy and 1-3 additional cycles of treatment depending on the response.  Labs from 10/08/17 assessed, okay for treatment.  Current medication list in Epic reviewed, no significant DDIs with Xeloda identified. No agent for thromboprophylaxis noted on medication profile Discussed with MD, patient will be counseled to take aspirin 81 mg tablets, 2 tablets (162 mg) once daily.  Prescription has been e-scribed to [place] (phone: [phone]) for benefits analysis and approval as Revlimid is a limited distribution medication.  Oral Oncology Clinic will continue to follow for insurance authorization, copayment issues, initial counseling and start date.  [name], PharmD, BCPS, BCOP 10/15/2017 3:44 PM Oral Oncology Clinic [phone]

## 2017-10-16 ENCOUNTER — Ambulatory Visit (HOSPITAL_COMMUNITY)
Admission: RE | Admit: 2017-10-16 | Discharge: 2017-10-16 | Disposition: A | Discharge status: Home / Self Care | Payer: PPO | Source: Ambulatory Visit | Attending: Hematology and Oncology | Admitting: Hematology and Oncology

## 2017-10-16 DIAGNOSIS — C9000 Multiple myeloma not having achieved remission: Secondary | ICD-10-CM | POA: Diagnosis not present

## 2017-10-16 DIAGNOSIS — R911 Solitary pulmonary nodule: Secondary | ICD-10-CM | POA: Diagnosis not present

## 2017-10-16 DIAGNOSIS — K449 Diaphragmatic hernia without obstruction or gangrene: Secondary | ICD-10-CM | POA: Diagnosis not present

## 2017-10-16 DIAGNOSIS — I2510 Atherosclerotic heart disease of native coronary artery without angina pectoris: Secondary | ICD-10-CM | POA: Diagnosis not present

## 2017-10-16 DIAGNOSIS — I700 Atherosclerosis of aorta: Secondary | ICD-10-CM | POA: Insufficient documentation

## 2017-10-16 DIAGNOSIS — E041 Nontoxic single thyroid nodule: Secondary | ICD-10-CM | POA: Insufficient documentation

## 2017-10-16 LAB — GLUCOSE, CAPILLARY: GLUCOSE-CAPILLARY: 103 mg/dL — AB (ref 65–99)

## 2017-10-16 MED ORDER — LENALIDOMIDE 25 MG PO CAPS: ORAL_CAPSULE | ORAL | 0 refills | Status: DC

## 2017-10-16 MED ORDER — FLUDEOXYGLUCOSE F - 18 (FDG) INJECTION
9.0500 | Freq: Once | INTRAVENOUS | Status: AC
  Administered 2017-10-16: 9.05 via INTRAVENOUS

## 2017-10-16 NOTE — Telephone Encounter (Signed)
Oral Chemotherapy Pharmacist Encounter   I spoke with patient for overview of: Revlimid for the treatment of newly diagnosed multiple myeloma in conjunction with dexamethasone and Velcade.   Counseled patient on administration, dosing, side effects, monitoring, drug-food interactions, safe handling, storage, and disposal.  Patient will take Revlimid 24m capsules, 1 capsule by mouth once daily, without regard to food, with a full glass of water. Revlimid will be given 14 days on, 7 days off, repeat every 21 days.  Patient will take dexamethasone 42mtablets, 2 and 1/2 tablets (1051mby mouth with breakfast.  Patient will take dexamethasone on the day before and as the day of her Velcade injections.   Velcade will be administered at 1.3 mg/m SQ on days 1, 4, 8, and 11 of each 21-day cycle.  Revlimid start date: 10/23/17  Side effects of Revlimid include but not limited to: nausea, constipation, diarrhea, abdominal pain, rash, fatigue, drug fever, and decreased blood counts.    Reviewed with patient importance of keeping a medication schedule and plan for any missed doses.  Ms. ColHeegiced understanding and appreciation.   Medication reconciliation performed and medication/allergy list updated.  Patient has picked up acyclovir and dexamethasone prescriptions and will wait until 10/22/17 (the day prior to [place] starting dexamethasone.  Patient counseled on importance of daily aspirin 162m62mr VTE prophylaxis.  She will obtain aspirin from the drugstore.   We discussed the role of each of the agents in patient's regimen. Patient counseled about the safety concerns with Revlimid and females of childbearing potential.  Patient updated about [facility] as dispensing pharmacy for Revlimid and provided phone number [phone] updated about possible high copayment and ability of dispensing pharmacy to enroll her blood copayment assistance grants.  Noted that patient  already has a grant [facility] Patient instructed to try to reserve this grant for any outstanding balances from the office, and allow the dispensing pharmacy to enroll for a new copayment grant for any charges she may incur for her Revlimid.  All questions answered.  Patient knows to call the office with questions or concerns. Oral Oncology Clinic will continue to follow.  JessJohny DrillingarmD, BCPS, BCOP 10/16/2017    9:59 AM Oral Oncology Clinic [phone]

## 2017-10-19 ENCOUNTER — Encounter (HOSPITAL_COMMUNITY): Payer: Self-pay | Admitting: Dentistry

## 2017-10-20 ENCOUNTER — Encounter (HOSPITAL_COMMUNITY): Payer: Self-pay | Admitting: Dentistry

## 2017-10-20 ENCOUNTER — Encounter: Payer: Self-pay | Admitting: Hematology and Oncology

## 2017-10-20 NOTE — Telephone Encounter (Signed)
Oral Oncology Patient Advocate Encounter  Received communication from [place] that the patient's insurance company was, in fact, requiring additional clinical information before approving the patient's Revlimid.   Clinical documentation was faxed to [place] at [phone].   Fabio Asa. Melynda Keller, [place] Patient [place] [phone] 10/20/2017 10:04 AM

## 2017-10-20 NOTE — Telephone Encounter (Signed)
Oral Oncology Pharmacist Encounter  Patient updated about delaying insurance authorization. Oral oncology clinic will follow up with the patient once insurance authorization is approved to inform her that the pharmacy will continue to process her Revlimid prescription.  Patient appreciative update. Ms. Duncanson knows to call the office with any additional questions or concerns.  Oral Oncology Clinic will continue to follow.  Johny Drilling, PharmD, BCPS, BCOP 10/20/2017 12:29 PM Oral Oncology Clinic [phone]

## 2017-10-20 NOTE — Progress Notes (Signed)
Called patient to advise of LLS approval. Advised her there is nothing she needs to do on her end. Also advised her of the $256 gift card application submission and if she receives information relating to this, she can send this information. Patient has my name and number for any additional questions or concerns.

## 2017-10-21 ENCOUNTER — Encounter (HOSPITAL_COMMUNITY): Payer: Self-pay | Admitting: Dentistry

## 2017-10-21 ENCOUNTER — Ambulatory Visit (HOSPITAL_COMMUNITY): Payer: Self-pay | Admitting: Dentistry

## 2017-10-21 VITALS — BP 147/65 | HR 97 | Temp 98.4°F

## 2017-10-21 DIAGNOSIS — K0530 Chronic periodontitis, unspecified: Secondary | ICD-10-CM

## 2017-10-21 DIAGNOSIS — K06010 Localized gingival recession, unspecified: Secondary | ICD-10-CM

## 2017-10-21 DIAGNOSIS — K036 Deposits [accretions] on teeth: Secondary | ICD-10-CM

## 2017-10-21 DIAGNOSIS — C9000 Multiple myeloma not having achieved remission: Secondary | ICD-10-CM

## 2017-10-21 DIAGNOSIS — Z01818 Encounter for other preprocedural examination: Secondary | ICD-10-CM

## 2017-10-21 NOTE — Progress Notes (Signed)
10/21/2017  Patient:            [name] Date of Birth:  Nov 01, 1950 MRN:                [id number]   BP (!) 147/65 (BP Location: Left Arm)   Pulse 97   Temp 98.4 F (36.9 C)    [name] 67 year old female recently diagnosed with multiple myeloma. Patient with anticipated chemotherapy to start later this week. Patient with anticipated use of Zometa therapy as well. Patient now presents for initial periodontal therapy with a gross debridement procedure. The patient will then need to follow-up with a primary dentist of her choice for exam, radiographs, and continued dental care every  4-6 months as indicated.  Procedure: [id number]  Gross debridement procedure.  A cavitron ultrasonic scaler was used to remove accretions. A series of hand curettes were used to further remove accretions. The cavitron was then again used to further refine removal of accretions. Teeth were polished. Floss. Oral hygiene instruction provided.  Patient tolerated procedure well. Patient is cleared for start of chemotherapy and Zometa therapy.  Lenn Cal, DDS

## 2017-10-21 NOTE — Patient Instructions (Signed)
Patient is currently cleared for start of chemotherapy as well as Zometa therapy. Patient is to follow-up with a new primary dentist for exam, radiographs, and continued dental therapy as indicated.  Dr. Enrique Sack

## 2017-10-22 ENCOUNTER — Other Ambulatory Visit: Payer: Self-pay

## 2017-10-22 DIAGNOSIS — C9000 Multiple myeloma not having achieved remission: Secondary | ICD-10-CM

## 2017-10-23 ENCOUNTER — Encounter: Payer: Self-pay | Admitting: Hematology and Oncology

## 2017-10-23 ENCOUNTER — Inpatient Hospital Stay: Payer: PPO

## 2017-10-23 ENCOUNTER — Inpatient Hospital Stay (HOSPITAL_BASED_OUTPATIENT_CLINIC_OR_DEPARTMENT_OTHER): Payer: PPO | Admitting: Hematology and Oncology

## 2017-10-23 VITALS — BP 135/71

## 2017-10-23 VITALS — BP 134/113 | HR 90 | Temp 98.3°F | Resp 18 | Ht 60.05 in | Wt 179.8 lb

## 2017-10-23 DIAGNOSIS — Z853 Personal history of malignant neoplasm of breast: Secondary | ICD-10-CM

## 2017-10-23 DIAGNOSIS — Z9011 Acquired absence of right breast and nipple: Secondary | ICD-10-CM | POA: Diagnosis not present

## 2017-10-23 DIAGNOSIS — Z5112 Encounter for antineoplastic immunotherapy: Secondary | ICD-10-CM

## 2017-10-23 DIAGNOSIS — Z79899 Other long term (current) drug therapy: Secondary | ICD-10-CM

## 2017-10-23 DIAGNOSIS — C9000 Multiple myeloma not having achieved remission: Secondary | ICD-10-CM | POA: Diagnosis not present

## 2017-10-23 DIAGNOSIS — D649 Anemia, unspecified: Secondary | ICD-10-CM | POA: Diagnosis not present

## 2017-10-23 DIAGNOSIS — Z5111 Encounter for antineoplastic chemotherapy: Secondary | ICD-10-CM

## 2017-10-23 LAB — CBC WITH DIFFERENTIAL (CANCER CENTER ONLY)
Basophils Absolute: 0 10*3/uL (ref 0.0–0.1)
Basophils Relative: 0 %
EOS ABS: 0 10*3/uL (ref 0.0–0.5)
EOS PCT: 0 %
HCT: 31.1 % — ABNORMAL LOW (ref 34.8–46.6)
Hemoglobin: 9.9 g/dL — ABNORMAL LOW (ref 11.6–15.9)
LYMPHS ABS: 1.3 10*3/uL (ref 0.9–3.3)
Lymphocytes Relative: 16 %
MCH: 30.4 pg (ref 25.1–34.0)
MCHC: 31.8 g/dL (ref 31.5–36.0)
MCV: 95.4 fL (ref 79.5–101.0)
Monocytes Absolute: 0.2 10*3/uL (ref 0.1–0.9)
Monocytes Relative: 2 %
Neutro Abs: 6.2 10*3/uL (ref 1.5–6.5)
Neutrophils Relative %: 82 %
PLATELETS: 198 10*3/uL (ref 145–400)
RBC: 3.26 MIL/uL — AB (ref 3.70–5.45)
RDW: 16.9 % — ABNORMAL HIGH (ref 11.2–14.5)
WBC: 7.7 10*3/uL (ref 3.9–10.3)

## 2017-10-23 LAB — CMP (CANCER CENTER ONLY)
ALK PHOS: 81 U/L (ref 40–150)
ALT: 26 U/L (ref 0–55)
AST: 26 U/L (ref 5–34)
Albumin: 3.6 g/dL (ref 3.5–5.0)
Anion gap: 10 (ref 3–11)
BUN: 13 mg/dL (ref 7–26)
CO2: 20 mmol/L — ABNORMAL LOW (ref 22–29)
CREATININE: 0.76 mg/dL (ref 0.60–1.10)
Calcium: 8.9 mg/dL (ref 8.4–10.4)
Chloride: 104 mmol/L (ref 98–109)
GFR, Est AFR Am: >60 mL/min (ref >60)
Glucose, Bld: 165 mg/dL — ABNORMAL HIGH (ref 70–140)
Potassium: 4.4 mmol/L (ref 3.5–5.1)
Sodium: 134 mmol/L — ABNORMAL LOW (ref 136–145)
TOTAL PROTEIN: 7.1 g/dL (ref 6.4–8.3)
Total Bilirubin: 0.3 mg/dL (ref 0.2–1.2)

## 2017-10-23 LAB — MAGNESIUM: Magnesium: 1.8 mg/dL (ref 1.7–2.4)

## 2017-10-23 MED ORDER — PROCHLORPERAZINE MALEATE 10 MG PO TABS
10.0000 mg | ORAL_TABLET | Freq: Once | ORAL | Status: AC
  Administered 2017-10-23: 10 mg via ORAL

## 2017-10-23 MED ORDER — PROCHLORPERAZINE MALEATE 10 MG PO TABS
ORAL_TABLET | ORAL | Status: AC
  Filled 2017-10-23: qty 1

## 2017-10-23 MED ORDER — SODIUM CHLORIDE 0.9 % IV SOLN
Freq: Once | INTRAVENOUS | Status: AC
  Administered 2017-10-23: 17:00:00 via INTRAVENOUS

## 2017-10-23 MED ORDER — BORTEZOMIB CHEMO SQ INJECTION 3.5 MG (2.5MG/ML)
1.3000 mg/m2 | Freq: Once | INTRAMUSCULAR | Status: AC
  Administered 2017-10-23: 2.5 mg via SUBCUTANEOUS
  Filled 2017-10-23: qty 2.5

## 2017-10-23 MED ORDER — ZOLEDRONIC ACID 4 MG/100ML IV SOLN
4.0000 mg | Freq: Once | INTRAVENOUS | Status: AC
  Administered 2017-10-23: 4 mg via INTRAVENOUS
  Filled 2017-10-23: qty 100

## 2017-10-23 NOTE — Progress Notes (Signed)
Patient has received dental clearance from Dr. Enrique Sack per Dr. Lebron Conners. [place] for [employer] today.  Raul Del, PharmD, BCPS, BCOP

## 2017-10-23 NOTE — Patient Instructions (Addendum)
[facility] Discharge Instructions for Patients Receiving Chemotherapy  Today you received the following chemotherapy agents Velcade  To help prevent nausea and vomiting after your treatment, we encourage you to take your nausea medication as prescribed.   If you develop nausea and vomiting that is not controlled by your nausea medication, call the clinic.   BELOW ARE SYMPTOMS THAT SHOULD BE REPORTED IMMEDIATELY:  *FEVER GREATER THAN 100.5 F  *CHILLS WITH OR WITHOUT FEVER  NAUSEA AND VOMITING THAT IS NOT CONTROLLED WITH YOUR NAUSEA MEDICATION  *UNUSUAL SHORTNESS OF BREATH  *UNUSUAL BRUISING OR BLEEDING  TENDERNESS IN MOUTH AND THROAT WITH OR WITHOUT PRESENCE OF ULCERS  *URINARY PROBLEMS  *BOWEL PROBLEMS  UNUSUAL RASH Items with * indicate a potential emergency and should be followed up as soon as possible.  Feel free to call the clinic should you have any questions or concerns. The clinic phone number is [phone].  Please show the [facility] at check-in to the Emergency Department and triage nurse.  Bortezomib injection (Velcade) What is this medicine? BORTEZOMIB (bor TEZ oh mib) is a medicine that targets proteins in cancer cells and stops the cancer cells from growing. It is used to treat multiple myeloma and mantle-cell lymphoma. This medicine may be used for other purposes; ask your health care provider or pharmacist if you have questions. COMMON BRAND NAME(S): Velcade What should I tell my health care provider before I take this medicine? They need to know if you have any of these conditions: -diabetes -heart disease -irregular heartbeat -liver disease -on hemodialysis -low blood counts, like low white blood cells, platelets, or hemoglobin -peripheral neuropathy -taking medicine for blood pressure -an unusual or allergic reaction to bortezomib, mannitol, boron, other medicines, foods, dyes, or preservatives -pregnant or trying to get  pregnant -breast-feeding How should I use this medicine? This medicine is for injection into a vein or for injection under the skin. It is given by a health care professional in a hospital or clinic setting. Talk to your pediatrician regarding the use of this medicine in children. Special care may be needed. Overdosage: If you think you have taken too much of this medicine contact a poison control center or emergency room at once. NOTE: This medicine is only for you. Do not share this medicine with others. What if I miss a dose? It is important not to miss your dose. Call your doctor or health care professional if you are unable to keep an appointment. What may interact with this medicine? This medicine may interact with the following medications: -ketoconazole -rifampin -ritonavir -St. John's Wort This list may not describe all possible interactions. Give your health care provider a list of all the medicines, herbs, non-prescription drugs, or dietary supplements you use. Also tell them if you smoke, drink alcohol, or use illegal drugs. Some items may interact with your medicine. What should I watch for while using this medicine? You may get drowsy or dizzy. Do not drive, use machinery, or do anything that needs mental alertness until you know how this medicine affects you. Do not stand or sit up quickly, especially if you are an older patient. This reduces the risk of dizzy or fainting spells. In some cases, you may be given additional medicines to help with side effects. Follow all directions for their use. Call your doctor or health care professional for advice if you get a fever, chills or sore throat, or other symptoms of a cold or flu. Do not  treat yourself. This drug decreases your body's ability to fight infections. Try to avoid being around people who are sick. This medicine may increase your risk to bruise or bleed. Call your doctor or health care professional if you notice any unusual  bleeding. You may need blood work done while you are taking this medicine. In some patients, this medicine may cause a serious brain infection that may cause death. If you have any problems seeing, thinking, speaking, walking, or standing, tell your doctor right away. If you cannot reach your doctor, urgently seek other source of medical care. Check with your doctor or health care professional if you get an attack of severe diarrhea, nausea and vomiting, or if you sweat a lot. The loss of too much body fluid can make it dangerous for you to take this medicine. Do not become pregnant while taking this medicine or for at least 2 months after stopping it. Women should inform their doctor if they wish to become pregnant or think they might be pregnant. Men should not father a child while taking this medicine and for at least 2 months after stopping it. There is a potential for serious side effects to an unborn child. Talk to your health care professional or pharmacist for more information. Do not breast-feed an infant while taking this medicine or for 2 months after stopping it. This medicine may interfere with the ability to have a child. You should talk with your doctor or health care professional if you are concerned about your fertility. What side effects may I notice from receiving this medicine? Side effects that you should report to your doctor or health care professional as soon as possible: -allergic reactions like skin rash, itching or hives, swelling of the face, lips, or tongue -breathing problems -changes in hearing -changes in vision -fast, irregular heartbeat -feeling faint or lightheaded, falls -pain, tingling, numbness in the hands or feet -right upper belly pain -seizures -swelling of the ankles, feet, hands -unusual bleeding or bruising -unusually weak or tired -vomiting -yellowing of the eyes or skin Side effects that usually do not require medical attention (report to your  doctor or health care professional if they continue or are bothersome): -changes in emotions or moods -constipation -diarrhea -loss of appetite -headache -irritation at site where injected -nausea This list may not describe all possible side effects. Call your doctor for medical advice about side effects. You may report side effects to FDA at 1-800-FDA-1088. Where should I keep my medicine? This drug is given in a hospital or clinic and will not be stored at home. NOTE: This sheet is a summary. It may not cover all possible information. If you have questions about this medicine, talk to your doctor, pharmacist, or health care provider.  2018 Elsevier/Gold Standard (2016-05-15 15:53:51)   Zoledronic Acid injection (Hypercalcemia, Oncology) What is this medicine? ZOLEDRONIC ACID (ZOE le dron ik AS id) lowers the amount of calcium loss from bone. It is used to treat too much calcium in your blood from cancer. It is also used to prevent complications of cancer that has spread to the bone. This medicine may be used for other purposes; ask your health care provider or pharmacist if you have questions. COMMON BRAND NAME(S): Zometa What should I tell my health care provider before I take this medicine? They need to know if you have any of these conditions: -aspirin-sensitive asthma -cancer, especially if you are receiving medicines used to treat cancer -dental disease or wear dentures -infection -  kidney disease -receiving corticosteroids like dexamethasone or prednisone -an unusual or allergic reaction to zoledronic acid, other medicines, foods, dyes, or preservatives -pregnant or trying to get pregnant -breast-feeding How should I use this medicine? This medicine is for infusion into a vein. It is given by a health care professional in a hospital or clinic setting. Talk to your pediatrician regarding the use of this medicine in children. Special care may be needed. Overdosage: If you  think you have taken too much of this medicine contact a poison control center or emergency room at once. NOTE: This medicine is only for you. Do not share this medicine with others. What if I miss a dose? It is important not to miss your dose. Call your doctor or health care professional if you are unable to keep an appointment. What may interact with this medicine? -certain antibiotics given by injection -NSAIDs, medicines for pain and inflammation, like ibuprofen or naproxen -some diuretics like bumetanide, furosemide -teriparatide -thalidomide This list may not describe all possible interactions. Give your health care provider a list of all the medicines, herbs, non-prescription drugs, or dietary supplements you use. Also tell them if you smoke, drink alcohol, or use illegal drugs. Some items may interact with your medicine. What should I watch for while using this medicine? Visit your doctor or health care professional for regular checkups. It may be some time before you see the benefit from this medicine. Do not stop taking your medicine unless your doctor tells you to. Your doctor may order blood tests or other tests to see how you are doing. Women should inform their doctor if they wish to become pregnant or think they might be pregnant. There is a potential for serious side effects to an unborn child. Talk to your health care professional or pharmacist for more information. You should make sure that you get enough calcium and vitamin D while you are taking this medicine. Discuss the foods you eat and the vitamins you take with your health care professional. Some people who take this medicine have severe bone, joint, and/or muscle pain. This medicine may also increase your risk for jaw problems or a broken thigh bone. Tell your doctor right away if you have severe pain in your jaw, bones, joints, or muscles. Tell your doctor if you have any pain that does not go away or that gets worse. Tell  your dentist and dental surgeon that you are taking this medicine. You should not have major dental surgery while on this medicine. See your dentist to have a dental exam and fix any dental problems before starting this medicine. Take good care of your teeth while on this medicine. Make sure you see your dentist for regular follow-up appointments. What side effects may I notice from receiving this medicine? Side effects that you should report to your doctor or health care professional as soon as possible: -allergic reactions like skin rash, itching or hives, swelling of the face, lips, or tongue -anxiety, confusion, or depression -breathing problems -changes in vision -eye pain -feeling faint or lightheaded, falls -jaw pain, especially after dental work -mouth sores -muscle cramps, stiffness, or weakness -redness, blistering, peeling or loosening of the skin, including inside the mouth -trouble passing urine or change in the amount of urine Side effects that usually do not require medical attention (report to your doctor or health care professional if they continue or are bothersome): -bone, joint, or muscle pain -constipation -diarrhea -fever -hair loss -irritation at site where injected -  loss of appetite -nausea, vomiting -stomach upset -trouble sleeping -trouble swallowing -weak or tired This list may not describe all possible side effects. Call your doctor for medical advice about side effects. You may report side effects to FDA at 1-800-FDA-1088. Where should I keep my medicine? This drug is given in a hospital or clinic and will not be stored at home. NOTE: This sheet is a summary. It may not cover all possible information. If you have questions about this medicine, talk to your doctor, pharmacist, or health care provider.  2018 Elsevier/Gold Standard (2013-11-12 14:19:39) Zoledronic Acid injection (Hypercalcemia, Oncology) What is this medicine? ZOLEDRONIC ACID (ZOE le dron  ik AS id) lowers the amount of calcium loss from bone. It is used to treat too much calcium in your blood from cancer. It is also used to prevent complications of cancer that has spread to the bone. This medicine may be used for other purposes; ask your health care provider or pharmacist if you have questions. COMMON BRAND NAME(S): Zometa What should I tell my health care provider before I take this medicine? They need to know if you have any of these conditions: -aspirin-sensitive asthma -cancer, especially if you are receiving medicines used to treat cancer -dental disease or wear dentures -infection -kidney disease -receiving corticosteroids like dexamethasone or prednisone -an unusual or allergic reaction to zoledronic acid, other medicines, foods, dyes, or preservatives -pregnant or trying to get pregnant -breast-feeding How should I use this medicine? This medicine is for infusion into a vein. It is given by a health care professional in a hospital or clinic setting. Talk to your pediatrician regarding the use of this medicine in children. Special care may be needed. Overdosage: If you think you have taken too much of this medicine contact a poison control center or emergency room at once. NOTE: This medicine is only for you. Do not share this medicine with others. What if I miss a dose? It is important not to miss your dose. Call your doctor or health care professional if you are unable to keep an appointment. What may interact with this medicine? -certain antibiotics given by injection -NSAIDs, medicines for pain and inflammation, like ibuprofen or naproxen -some diuretics like bumetanide, furosemide -teriparatide -thalidomide This list may not describe all possible interactions. Give your health care provider a list of all the medicines, herbs, non-prescription drugs, or dietary supplements you use. Also tell them if you smoke, drink alcohol, or use illegal drugs. Some items may  interact with your medicine. What should I watch for while using this medicine? Visit your doctor or health care professional for regular checkups. It may be some time before you see the benefit from this medicine. Do not stop taking your medicine unless your doctor tells you to. Your doctor may order blood tests or other tests to see how you are doing. Women should inform their doctor if they wish to become pregnant or think they might be pregnant. There is a potential for serious side effects to an unborn child. Talk to your health care professional or pharmacist for more information. You should make sure that you get enough calcium and vitamin D while you are taking this medicine. Discuss the foods you eat and the vitamins you take with your health care professional. Some people who take this medicine have severe bone, joint, and/or muscle pain. This medicine may also increase your risk for jaw problems or a broken thigh bone. Tell your doctor right away if you have  severe pain in your jaw, bones, joints, or muscles. Tell your doctor if you have any pain that does not go away or that gets worse. Tell your dentist and dental surgeon that you are taking this medicine. You should not have major dental surgery while on this medicine. See your dentist to have a dental exam and fix any dental problems before starting this medicine. Take good care of your teeth while on this medicine. Make sure you see your dentist for regular follow-up appointments. What side effects may I notice from receiving this medicine? Side effects that you should report to your doctor or health care professional as soon as possible: -allergic reactions like skin rash, itching or hives, swelling of the face, lips, or tongue -anxiety, confusion, or depression -breathing problems -changes in vision -eye pain -feeling faint or lightheaded, falls -jaw pain, especially after dental work -mouth sores -muscle cramps, stiffness, or  weakness -redness, blistering, peeling or loosening of the skin, including inside the mouth -trouble passing urine or change in the amount of urine Side effects that usually do not require medical attention (report to your doctor or health care professional if they continue or are bothersome): -bone, joint, or muscle pain -constipation -diarrhea -fever -hair loss -irritation at site where injected -loss of appetite -nausea, vomiting -stomach upset -trouble sleeping -trouble swallowing -weak or tired This list may not describe all possible side effects. Call your doctor for medical advice about side effects. You may report side effects to FDA at 1-800-FDA-1088. Where should I keep my medicine? This drug is given in a hospital or clinic and will not be stored at home. NOTE: This sheet is a summary. It may not cover all possible information. If you have questions about this medicine, talk to your doctor, pharmacist, or health care provider.  2018 Elsevier/Gold Standard (2013-11-12 14:19:39)   Bortezomib injection What is this medicine? BORTEZOMIB (bor TEZ oh mib) is a medicine that targets proteins in cancer cells and stops the cancer cells from growing. It is used to treat multiple myeloma and mantle-cell lymphoma. This medicine may be used for other purposes; ask your health care provider or pharmacist if you have questions. COMMON BRAND NAME(S): Velcade What should I tell my health care provider before I take this medicine? They need to know if you have any of these conditions: -diabetes -heart disease -irregular heartbeat -liver disease -on hemodialysis -low blood counts, like low white blood cells, platelets, or hemoglobin -peripheral neuropathy -taking medicine for blood pressure -an unusual or allergic reaction to bortezomib, mannitol, boron, other medicines, foods, dyes, or preservatives -pregnant or trying to get pregnant -breast-feeding How should I use this  medicine? This medicine is for injection into a vein or for injection under the skin. It is given by a health care professional in a hospital or clinic setting. Talk to your pediatrician regarding the use of this medicine in children. Special care may be needed. Overdosage: If you think you have taken too much of this medicine contact a poison control center or emergency room at once. NOTE: This medicine is only for you. Do not share this medicine with others. What if I miss a dose? It is important not to miss your dose. Call your doctor or health care professional if you are unable to keep an appointment. What may interact with this medicine? This medicine may interact with the following medications: -ketoconazole -rifampin -ritonavir -St. John's Wort This list may not describe all possible interactions. Give your health care  provider a list of all the medicines, herbs, non-prescription drugs, or dietary supplements you use. Also tell them if you smoke, drink alcohol, or use illegal drugs. Some items may interact with your medicine. What should I watch for while using this medicine? You may get drowsy or dizzy. Do not drive, use machinery, or do anything that needs mental alertness until you know how this medicine affects you. Do not stand or sit up quickly, especially if you are an older patient. This reduces the risk of dizzy or fainting spells. In some cases, you may be given additional medicines to help with side effects. Follow all directions for their use. Call your doctor or health care professional for advice if you get a fever, chills or sore throat, or other symptoms of a cold or flu. Do not treat yourself. This drug decreases your body's ability to fight infections. Try to avoid being around people who are sick. This medicine may increase your risk to bruise or bleed. Call your doctor or health care professional if you notice any unusual bleeding. You may need blood work done while you  are taking this medicine. In some patients, this medicine may cause a serious brain infection that may cause death. If you have any problems seeing, thinking, speaking, walking, or standing, tell your doctor right away. If you cannot reach your doctor, urgently seek other source of medical care. Check with your doctor or health care professional if you get an attack of severe diarrhea, nausea and vomiting, or if you sweat a lot. The loss of too much body fluid can make it dangerous for you to take this medicine. Do not become pregnant while taking this medicine or for at least 2 months after stopping it. Women should inform their doctor if they wish to become pregnant or think they might be pregnant. Men should not father a child while taking this medicine and for at least 2 months after stopping it. There is a potential for serious side effects to an unborn child. Talk to your health care professional or pharmacist for more information. Do not breast-feed an infant while taking this medicine or for 2 months after stopping it. This medicine may interfere with the ability to have a child. You should talk with your doctor or health care professional if you are concerned about your fertility. What side effects may I notice from receiving this medicine? Side effects that you should report to your doctor or health care professional as soon as possible: -allergic reactions like skin rash, itching or hives, swelling of the face, lips, or tongue -breathing problems -changes in hearing -changes in vision -fast, irregular heartbeat -feeling faint or lightheaded, falls -pain, tingling, numbness in the hands or feet -right upper belly pain -seizures -swelling of the ankles, feet, hands -unusual bleeding or bruising -unusually weak or tired -vomiting -yellowing of the eyes or skin Side effects that usually do not require medical attention (report to your doctor or health care professional if they continue or  are bothersome): -changes in emotions or moods -constipation -diarrhea -loss of appetite -headache -irritation at site where injected -nausea This list may not describe all possible side effects. Call your doctor for medical advice about side effects. You may report side effects to FDA at 1-800-FDA-1088. Where should I keep my medicine? This drug is given in a hospital or clinic and will not be stored at home. NOTE: This sheet is a summary. It may not cover all possible information. If you have  questions about this medicine, talk to your doctor, pharmacist, or health care provider.  2018 Elsevier/Gold Standard (2016-05-15 15:53:51)

## 2017-10-23 NOTE — Telephone Encounter (Signed)
Oral Oncology Patient Advocate Encounter  Received notification from [employer] that [name] will receive her initial shipment of Revlimid on 10/23/2017.   Fabio Asa. Melynda Keller, [place] [facility] [place] [phone] 10/23/2017 12:39 PM

## 2017-10-26 ENCOUNTER — Telehealth: Payer: Self-pay | Admitting: Hematology and Oncology

## 2017-10-26 NOTE — Telephone Encounter (Signed)
Patient wanted to verify her appointment

## 2017-10-27 ENCOUNTER — Inpatient Hospital Stay: Payer: PPO

## 2017-10-27 ENCOUNTER — Telehealth: Payer: Self-pay | Admitting: *Deleted

## 2017-10-27 VITALS — BP 119/58 | HR 91 | Temp 98.7°F | Resp 16

## 2017-10-27 DIAGNOSIS — G894 Chronic pain syndrome: Secondary | ICD-10-CM | POA: Diagnosis not present

## 2017-10-27 DIAGNOSIS — Z5111 Encounter for antineoplastic chemotherapy: Secondary | ICD-10-CM | POA: Diagnosis not present

## 2017-10-27 DIAGNOSIS — M4807 Spinal stenosis, lumbosacral region: Secondary | ICD-10-CM | POA: Diagnosis not present

## 2017-10-27 DIAGNOSIS — F329 Major depressive disorder, single episode, unspecified: Secondary | ICD-10-CM | POA: Diagnosis not present

## 2017-10-27 DIAGNOSIS — C9000 Multiple myeloma not having achieved remission: Secondary | ICD-10-CM

## 2017-10-27 DIAGNOSIS — M47817 Spondylosis without myelopathy or radiculopathy, lumbosacral region: Secondary | ICD-10-CM | POA: Diagnosis not present

## 2017-10-27 MED ORDER — PROCHLORPERAZINE MALEATE 10 MG PO TABS
ORAL_TABLET | ORAL | Status: AC
  Filled 2017-10-27: qty 1

## 2017-10-27 MED ORDER — BORTEZOMIB CHEMO SQ INJECTION 3.5 MG (2.5MG/ML)
1.3000 mg/m2 | Freq: Once | INTRAMUSCULAR | Status: AC
  Administered 2017-10-27: 2.5 mg via SUBCUTANEOUS
  Filled 2017-10-27: qty 2.5

## 2017-10-27 MED ORDER — PROCHLORPERAZINE MALEATE 10 MG PO TABS
10.0000 mg | ORAL_TABLET | Freq: Once | ORAL | Status: AC
  Administered 2017-10-27: 10 mg via ORAL

## 2017-10-27 NOTE — Telephone Encounter (Signed)
"  Is Dr. Clydene Laming nurse available to talk with me, I do not want to leave a message.  I need to speak with his nurse, do not trust the lady at the front desk looking into scheduled appointments.  The last time I came an hour early to be scheduled with my doctor because appointments were scheduled wrong."  Reviewed LOS which reads next appointment will be 10-30-2017.  Protocol for pre-treatment lab is seven days before treatment.  Labs checked with the 10-23-2017 F/U visit.  Transferred to collaborative as requested for further communication.

## 2017-10-27 NOTE — Patient Instructions (Signed)
[facility] Discharge Instructions for Patients Receiving Chemotherapy  Today you received the following chemotherapy agents velcade   To help prevent nausea and vomiting after your treatment, we encourage you to take your nausea medication as directed  If you develop nausea and vomiting that is not controlled by your nausea medication, call the clinic.   BELOW ARE SYMPTOMS THAT SHOULD BE REPORTED IMMEDIATELY:  *FEVER GREATER THAN 100.5 F  *CHILLS WITH OR WITHOUT FEVER  NAUSEA AND VOMITING THAT IS NOT CONTROLLED WITH YOUR NAUSEA MEDICATION  *UNUSUAL SHORTNESS OF BREATH  *UNUSUAL BRUISING OR BLEEDING  TENDERNESS IN MOUTH AND THROAT WITH OR WITHOUT PRESENCE OF ULCERS  *URINARY PROBLEMS  *BOWEL PROBLEMS  UNUSUAL RASH Items with * indicate a potential emergency and should be followed up as soon as possible.  Feel free to call the clinic you have any questions or concerns. The clinic phone number is [phone].  

## 2017-10-29 ENCOUNTER — Other Ambulatory Visit: Payer: Self-pay

## 2017-10-29 DIAGNOSIS — C9000 Multiple myeloma not having achieved remission: Secondary | ICD-10-CM

## 2017-10-30 ENCOUNTER — Inpatient Hospital Stay: Payer: PPO | Attending: Hematology and Oncology

## 2017-10-30 ENCOUNTER — Inpatient Hospital Stay (HOSPITAL_BASED_OUTPATIENT_CLINIC_OR_DEPARTMENT_OTHER): Payer: PPO | Admitting: Hematology and Oncology

## 2017-10-30 ENCOUNTER — Inpatient Hospital Stay: Payer: PPO

## 2017-10-30 ENCOUNTER — Encounter: Payer: Self-pay | Admitting: Hematology and Oncology

## 2017-10-30 ENCOUNTER — Telehealth: Payer: Self-pay | Admitting: Hematology and Oncology

## 2017-10-30 VITALS — BP 143/77 | HR 108 | Temp 98.3°F | Resp 17 | Ht 60.5 in | Wt 176.9 lb

## 2017-10-30 VITALS — HR 101

## 2017-10-30 DIAGNOSIS — Z79899 Other long term (current) drug therapy: Secondary | ICD-10-CM | POA: Diagnosis not present

## 2017-10-30 DIAGNOSIS — Z5111 Encounter for antineoplastic chemotherapy: Secondary | ICD-10-CM

## 2017-10-30 DIAGNOSIS — M797 Fibromyalgia: Secondary | ICD-10-CM | POA: Insufficient documentation

## 2017-10-30 DIAGNOSIS — I10 Essential (primary) hypertension: Secondary | ICD-10-CM | POA: Diagnosis not present

## 2017-10-30 DIAGNOSIS — Z9011 Acquired absence of right breast and nipple: Secondary | ICD-10-CM | POA: Diagnosis not present

## 2017-10-30 DIAGNOSIS — Z87891 Personal history of nicotine dependence: Secondary | ICD-10-CM | POA: Insufficient documentation

## 2017-10-30 DIAGNOSIS — Z7982 Long term (current) use of aspirin: Secondary | ICD-10-CM | POA: Insufficient documentation

## 2017-10-30 DIAGNOSIS — D509 Iron deficiency anemia, unspecified: Secondary | ICD-10-CM | POA: Insufficient documentation

## 2017-10-30 DIAGNOSIS — D709 Neutropenia, unspecified: Secondary | ICD-10-CM | POA: Insufficient documentation

## 2017-10-30 DIAGNOSIS — F329 Major depressive disorder, single episode, unspecified: Secondary | ICD-10-CM | POA: Insufficient documentation

## 2017-10-30 DIAGNOSIS — E8809 Other disorders of plasma-protein metabolism, not elsewhere classified: Secondary | ICD-10-CM | POA: Insufficient documentation

## 2017-10-30 DIAGNOSIS — E119 Type 2 diabetes mellitus without complications: Secondary | ICD-10-CM | POA: Insufficient documentation

## 2017-10-30 DIAGNOSIS — Z853 Personal history of malignant neoplasm of breast: Secondary | ICD-10-CM | POA: Diagnosis not present

## 2017-10-30 DIAGNOSIS — C9000 Multiple myeloma not having achieved remission: Secondary | ICD-10-CM | POA: Diagnosis not present

## 2017-10-30 DIAGNOSIS — Z9049 Acquired absence of other specified parts of digestive tract: Secondary | ICD-10-CM | POA: Diagnosis not present

## 2017-10-30 DIAGNOSIS — M1990 Unspecified osteoarthritis, unspecified site: Secondary | ICD-10-CM | POA: Insufficient documentation

## 2017-10-30 DIAGNOSIS — K5900 Constipation, unspecified: Secondary | ICD-10-CM | POA: Diagnosis not present

## 2017-10-30 DIAGNOSIS — K219 Gastro-esophageal reflux disease without esophagitis: Secondary | ICD-10-CM | POA: Diagnosis not present

## 2017-10-30 DIAGNOSIS — F419 Anxiety disorder, unspecified: Secondary | ICD-10-CM | POA: Diagnosis not present

## 2017-10-30 DIAGNOSIS — Z5112 Encounter for antineoplastic immunotherapy: Secondary | ICD-10-CM

## 2017-10-30 LAB — CMP (CANCER CENTER ONLY)
ALBUMIN: 3.8 g/dL (ref 3.5–5.0)
ALK PHOS: 77 U/L (ref 40–150)
ALT: 43 U/L (ref 0–55)
ANION GAP: 10 (ref 3–11)
AST: 47 U/L — ABNORMAL HIGH (ref 5–34)
BUN: 13 mg/dL (ref 7–26)
CHLORIDE: 106 mmol/L (ref 98–109)
CO2: 21 mmol/L — AB (ref 22–29)
CREATININE: 0.81 mg/dL (ref 0.60–1.10)
Calcium: 8.4 mg/dL (ref 8.4–10.4)
GFR, Estimated: >60 mL/min (ref >60)
GLUCOSE: 203 mg/dL — AB (ref 70–140)
Potassium: 4.2 mmol/L (ref 3.5–5.1)
SODIUM: 137 mmol/L (ref 136–145)
Total Bilirubin: 0.3 mg/dL (ref 0.2–1.2)
Total Protein: 7.3 g/dL (ref 6.4–8.3)

## 2017-10-30 LAB — CBC WITH DIFFERENTIAL (CANCER CENTER ONLY)
Basophils Absolute: 0 10*3/uL (ref 0.0–0.1)
Basophils Relative: 0 %
Eosinophils Absolute: 0 10*3/uL (ref 0.0–0.5)
Eosinophils Relative: 0 %
HEMATOCRIT: 33.9 % — AB (ref 34.8–46.6)
HEMOGLOBIN: 10.7 g/dL — AB (ref 11.6–15.9)
LYMPHS ABS: 0.9 10*3/uL (ref 0.9–3.3)
Lymphocytes Relative: 16 %
MCH: 29.2 pg (ref 25.1–34.0)
MCHC: 31.6 g/dL (ref 31.5–36.0)
MCV: 92.6 fL (ref 79.5–101.0)
MONO ABS: 0.1 10*3/uL (ref 0.1–0.9)
Monocytes Relative: 1 %
NEUTROS ABS: 4.6 10*3/uL (ref 1.5–6.5)
NEUTROS PCT: 83 %
Platelet Count: 167 10*3/uL (ref 145–400)
RBC: 3.66 MIL/uL — ABNORMAL LOW (ref 3.70–5.45)
RDW: 16.4 % — AB (ref 11.2–14.5)
WBC Count: 5.6 10*3/uL (ref 3.9–10.3)

## 2017-10-30 LAB — MAGNESIUM: MAGNESIUM: 2.3 mg/dL (ref 1.7–2.4)

## 2017-10-30 MED ORDER — PROCHLORPERAZINE MALEATE 10 MG PO TABS
ORAL_TABLET | ORAL | Status: AC
  Filled 2017-10-30: qty 1

## 2017-10-30 MED ORDER — BORTEZOMIB CHEMO SQ INJECTION 3.5 MG (2.5MG/ML)
1.3000 mg/m2 | Freq: Once | INTRAMUSCULAR | Status: AC
  Administered 2017-10-30: 2.5 mg via SUBCUTANEOUS
  Filled 2017-10-30: qty 2.5

## 2017-10-30 MED ORDER — PROCHLORPERAZINE MALEATE 10 MG PO TABS
10.0000 mg | ORAL_TABLET | Freq: Once | ORAL | Status: AC
  Administered 2017-10-30: 10 mg via ORAL

## 2017-10-30 NOTE — Progress Notes (Signed)
Per Dr Lebron Conners ok to Tx today with elevated pulse of 101

## 2017-10-30 NOTE — Patient Instructions (Signed)
[facility] Discharge Instructions for Patients Receiving Chemotherapy  Today you received the following chemotherapy agents Velcade  To help prevent nausea and vomiting after your treatment, we encourage you to take your nausea medication as prescribed.   If you develop nausea and vomiting that is not controlled by your nausea medication, call the clinic.   BELOW ARE SYMPTOMS THAT SHOULD BE REPORTED IMMEDIATELY:  *FEVER GREATER THAN 100.5 F  *CHILLS WITH OR WITHOUT FEVER  NAUSEA AND VOMITING THAT IS NOT CONTROLLED WITH YOUR NAUSEA MEDICATION  *UNUSUAL SHORTNESS OF BREATH  *UNUSUAL BRUISING OR BLEEDING  TENDERNESS IN MOUTH AND THROAT WITH OR WITHOUT PRESENCE OF ULCERS  *URINARY PROBLEMS  *BOWEL PROBLEMS  UNUSUAL RASH Items with * indicate a potential emergency and should be followed up as soon as possible.  Feel free to call the clinic should you have any questions or concerns. The clinic phone number is [phone].  Please show the [facility] at check-in to the Emergency Department and triage nurse.  Bortezomib injection (Velcade) What is this medicine? BORTEZOMIB (bor TEZ oh mib) is a medicine that targets proteins in cancer cells and stops the cancer cells from growing. It is used to treat multiple myeloma and mantle-cell lymphoma. This medicine may be used for other purposes; ask your health care provider or pharmacist if you have questions. COMMON BRAND NAME(S): Velcade What should I tell my health care provider before I take this medicine? They need to know if you have any of these conditions: -diabetes -heart disease -irregular heartbeat -liver disease -on hemodialysis -low blood counts, like low white blood cells, platelets, or hemoglobin -peripheral neuropathy -taking medicine for blood pressure -an unusual or allergic reaction to bortezomib, mannitol, boron, other medicines, foods, dyes, or preservatives -pregnant or trying to get  pregnant -breast-feeding How should I use this medicine? This medicine is for injection into a vein or for injection under the skin. It is given by a health care professional in a hospital or clinic setting. Talk to your pediatrician regarding the use of this medicine in children. Special care may be needed. Overdosage: If you think you have taken too much of this medicine contact a poison control center or emergency room at once. NOTE: This medicine is only for you. Do not share this medicine with others. What if I miss a dose? It is important not to miss your dose. Call your doctor or health care professional if you are unable to keep an appointment. What may interact with this medicine? This medicine may interact with the following medications: -ketoconazole -rifampin -ritonavir -St. John's Wort This list may not describe all possible interactions. Give your health care provider a list of all the medicines, herbs, non-prescription drugs, or dietary supplements you use. Also tell them if you smoke, drink alcohol, or use illegal drugs. Some items may interact with your medicine. What should I watch for while using this medicine? You may get drowsy or dizzy. Do not drive, use machinery, or do anything that needs mental alertness until you know how this medicine affects you. Do not stand or sit up quickly, especially if you are an older patient. This reduces the risk of dizzy or fainting spells. In some cases, you may be given additional medicines to help with side effects. Follow all directions for their use. Call your doctor or health care professional for advice if you get a fever, chills or sore throat, or other symptoms of a cold or flu. Do not  treat yourself. This drug decreases your body's ability to fight infections. Try to avoid being around people who are sick. This medicine may increase your risk to bruise or bleed. Call your doctor or health care professional if you notice any unusual  bleeding. You may need blood work done while you are taking this medicine. In some patients, this medicine may cause a serious brain infection that may cause death. If you have any problems seeing, thinking, speaking, walking, or standing, tell your doctor right away. If you cannot reach your doctor, urgently seek other source of medical care. Check with your doctor or health care professional if you get an attack of severe diarrhea, nausea and vomiting, or if you sweat a lot. The loss of too much body fluid can make it dangerous for you to take this medicine. Do not become pregnant while taking this medicine or for at least 2 months after stopping it. Women should inform their doctor if they wish to become pregnant or think they might be pregnant. Men should not father a child while taking this medicine and for at least 2 months after stopping it. There is a potential for serious side effects to an unborn child. Talk to your health care professional or pharmacist for more information. Do not breast-feed an infant while taking this medicine or for 2 months after stopping it. This medicine may interfere with the ability to have a child. You should talk with your doctor or health care professional if you are concerned about your fertility. What side effects may I notice from receiving this medicine? Side effects that you should report to your doctor or health care professional as soon as possible: -allergic reactions like skin rash, itching or hives, swelling of the face, lips, or tongue -breathing problems -changes in hearing -changes in vision -fast, irregular heartbeat -feeling faint or lightheaded, falls -pain, tingling, numbness in the hands or feet -right upper belly pain -seizures -swelling of the ankles, feet, hands -unusual bleeding or bruising -unusually weak or tired -vomiting -yellowing of the eyes or skin Side effects that usually do not require medical attention (report to your  doctor or health care professional if they continue or are bothersome): -changes in emotions or moods -constipation -diarrhea -loss of appetite -headache -irritation at site where injected -nausea This list may not describe all possible side effects. Call your doctor for medical advice about side effects. You may report side effects to FDA at 1-800-FDA-1088. Where should I keep my medicine? This drug is given in a hospital or clinic and will not be stored at home. NOTE: This sheet is a summary. It may not cover all possible information. If you have questions about this medicine, talk to your doctor, pharmacist, or health care provider.  2018 Elsevier/Gold Standard (2016-05-15 15:53:51)   Zoledronic Acid injection (Hypercalcemia, Oncology) What is this medicine? ZOLEDRONIC ACID (ZOE le dron ik AS id) lowers the amount of calcium loss from bone. It is used to treat too much calcium in your blood from cancer. It is also used to prevent complications of cancer that has spread to the bone. This medicine may be used for other purposes; ask your health care provider or pharmacist if you have questions. COMMON BRAND NAME(S): Zometa What should I tell my health care provider before I take this medicine? They need to know if you have any of these conditions: -aspirin-sensitive asthma -cancer, especially if you are receiving medicines used to treat cancer -dental disease or wear dentures -infection -  kidney disease -receiving corticosteroids like dexamethasone or prednisone -an unusual or allergic reaction to zoledronic acid, other medicines, foods, dyes, or preservatives -pregnant or trying to get pregnant -breast-feeding How should I use this medicine? This medicine is for infusion into a vein. It is given by a health care professional in a hospital or clinic setting. Talk to your pediatrician regarding the use of this medicine in children. Special care may be needed. Overdosage: If you  think you have taken too much of this medicine contact a poison control center or emergency room at once. NOTE: This medicine is only for you. Do not share this medicine with others. What if I miss a dose? It is important not to miss your dose. Call your doctor or health care professional if you are unable to keep an appointment. What may interact with this medicine? -certain antibiotics given by injection -NSAIDs, medicines for pain and inflammation, like ibuprofen or naproxen -some diuretics like bumetanide, furosemide -teriparatide -thalidomide This list may not describe all possible interactions. Give your health care provider a list of all the medicines, herbs, non-prescription drugs, or dietary supplements you use. Also tell them if you smoke, drink alcohol, or use illegal drugs. Some items may interact with your medicine. What should I watch for while using this medicine? Visit your doctor or health care professional for regular checkups. It may be some time before you see the benefit from this medicine. Do not stop taking your medicine unless your doctor tells you to. Your doctor may order blood tests or other tests to see how you are doing. Women should inform their doctor if they wish to become pregnant or think they might be pregnant. There is a potential for serious side effects to an unborn child. Talk to your health care professional or pharmacist for more information. You should make sure that you get enough calcium and vitamin D while you are taking this medicine. Discuss the foods you eat and the vitamins you take with your health care professional. Some people who take this medicine have severe bone, joint, and/or muscle pain. This medicine may also increase your risk for jaw problems or a broken thigh bone. Tell your doctor right away if you have severe pain in your jaw, bones, joints, or muscles. Tell your doctor if you have any pain that does not go away or that gets worse. Tell  your dentist and dental surgeon that you are taking this medicine. You should not have major dental surgery while on this medicine. See your dentist to have a dental exam and fix any dental problems before starting this medicine. Take good care of your teeth while on this medicine. Make sure you see your dentist for regular follow-up appointments. What side effects may I notice from receiving this medicine? Side effects that you should report to your doctor or health care professional as soon as possible: -allergic reactions like skin rash, itching or hives, swelling of the face, lips, or tongue -anxiety, confusion, or depression -breathing problems -changes in vision -eye pain -feeling faint or lightheaded, falls -jaw pain, especially after dental work -mouth sores -muscle cramps, stiffness, or weakness -redness, blistering, peeling or loosening of the skin, including inside the mouth -trouble passing urine or change in the amount of urine Side effects that usually do not require medical attention (report to your doctor or health care professional if they continue or are bothersome): -bone, joint, or muscle pain -constipation -diarrhea -fever -hair loss -irritation at site where injected -  loss of appetite -nausea, vomiting -stomach upset -trouble sleeping -trouble swallowing -weak or tired This list may not describe all possible side effects. Call your doctor for medical advice about side effects. You may report side effects to FDA at 1-800-FDA-1088. Where should I keep my medicine? This drug is given in a hospital or clinic and will not be stored at home. NOTE: This sheet is a summary. It may not cover all possible information. If you have questions about this medicine, talk to your doctor, pharmacist, or health care provider.  2018 Elsevier/Gold Standard (2013-11-12 14:19:39) Zoledronic Acid injection (Hypercalcemia, Oncology) What is this medicine? ZOLEDRONIC ACID (ZOE le dron  ik AS id) lowers the amount of calcium loss from bone. It is used to treat too much calcium in your blood from cancer. It is also used to prevent complications of cancer that has spread to the bone. This medicine may be used for other purposes; ask your health care provider or pharmacist if you have questions. COMMON BRAND NAME(S): Zometa What should I tell my health care provider before I take this medicine? They need to know if you have any of these conditions: -aspirin-sensitive asthma -cancer, especially if you are receiving medicines used to treat cancer -dental disease or wear dentures -infection -kidney disease -receiving corticosteroids like dexamethasone or prednisone -an unusual or allergic reaction to zoledronic acid, other medicines, foods, dyes, or preservatives -pregnant or trying to get pregnant -breast-feeding How should I use this medicine? This medicine is for infusion into a vein. It is given by a health care professional in a hospital or clinic setting. Talk to your pediatrician regarding the use of this medicine in children. Special care may be needed. Overdosage: If you think you have taken too much of this medicine contact a poison control center or emergency room at once. NOTE: This medicine is only for you. Do not share this medicine with others. What if I miss a dose? It is important not to miss your dose. Call your doctor or health care professional if you are unable to keep an appointment. What may interact with this medicine? -certain antibiotics given by injection -NSAIDs, medicines for pain and inflammation, like ibuprofen or naproxen -some diuretics like bumetanide, furosemide -teriparatide -thalidomide This list may not describe all possible interactions. Give your health care provider a list of all the medicines, herbs, non-prescription drugs, or dietary supplements you use. Also tell them if you smoke, drink alcohol, or use illegal drugs. Some items may  interact with your medicine. What should I watch for while using this medicine? Visit your doctor or health care professional for regular checkups. It may be some time before you see the benefit from this medicine. Do not stop taking your medicine unless your doctor tells you to. Your doctor may order blood tests or other tests to see how you are doing. Women should inform their doctor if they wish to become pregnant or think they might be pregnant. There is a potential for serious side effects to an unborn child. Talk to your health care professional or pharmacist for more information. You should make sure that you get enough calcium and vitamin D while you are taking this medicine. Discuss the foods you eat and the vitamins you take with your health care professional. Some people who take this medicine have severe bone, joint, and/or muscle pain. This medicine may also increase your risk for jaw problems or a broken thigh bone. Tell your doctor right away if you have  severe pain in your jaw, bones, joints, or muscles. Tell your doctor if you have any pain that does not go away or that gets worse. Tell your dentist and dental surgeon that you are taking this medicine. You should not have major dental surgery while on this medicine. See your dentist to have a dental exam and fix any dental problems before starting this medicine. Take good care of your teeth while on this medicine. Make sure you see your dentist for regular follow-up appointments. What side effects may I notice from receiving this medicine? Side effects that you should report to your doctor or health care professional as soon as possible: -allergic reactions like skin rash, itching or hives, swelling of the face, lips, or tongue -anxiety, confusion, or depression -breathing problems -changes in vision -eye pain -feeling faint or lightheaded, falls -jaw pain, especially after dental work -mouth sores -muscle cramps, stiffness, or  weakness -redness, blistering, peeling or loosening of the skin, including inside the mouth -trouble passing urine or change in the amount of urine Side effects that usually do not require medical attention (report to your doctor or health care professional if they continue or are bothersome): -bone, joint, or muscle pain -constipation -diarrhea -fever -hair loss -irritation at site where injected -loss of appetite -nausea, vomiting -stomach upset -trouble sleeping -trouble swallowing -weak or tired This list may not describe all possible side effects. Call your doctor for medical advice about side effects. You may report side effects to FDA at 1-800-FDA-1088. Where should I keep my medicine? This drug is given in a hospital or clinic and will not be stored at home. NOTE: This sheet is a summary. It may not cover all possible information. If you have questions about this medicine, talk to your doctor, pharmacist, or health care provider.  2018 Elsevier/Gold Standard (2013-11-12 14:19:39)   Bortezomib injection What is this medicine? BORTEZOMIB (bor TEZ oh mib) is a medicine that targets proteins in cancer cells and stops the cancer cells from growing. It is used to treat multiple myeloma and mantle-cell lymphoma. This medicine may be used for other purposes; ask your health care provider or pharmacist if you have questions. COMMON BRAND NAME(S): Velcade What should I tell my health care provider before I take this medicine? They need to know if you have any of these conditions: -diabetes -heart disease -irregular heartbeat -liver disease -on hemodialysis -low blood counts, like low white blood cells, platelets, or hemoglobin -peripheral neuropathy -taking medicine for blood pressure -an unusual or allergic reaction to bortezomib, mannitol, boron, other medicines, foods, dyes, or preservatives -pregnant or trying to get pregnant -breast-feeding How should I use this  medicine? This medicine is for injection into a vein or for injection under the skin. It is given by a health care professional in a hospital or clinic setting. Talk to your pediatrician regarding the use of this medicine in children. Special care may be needed. Overdosage: If you think you have taken too much of this medicine contact a poison control center or emergency room at once. NOTE: This medicine is only for you. Do not share this medicine with others. What if I miss a dose? It is important not to miss your dose. Call your doctor or health care professional if you are unable to keep an appointment. What may interact with this medicine? This medicine may interact with the following medications: -ketoconazole -rifampin -ritonavir -St. John's Wort This list may not describe all possible interactions. Give your health care  provider a list of all the medicines, herbs, non-prescription drugs, or dietary supplements you use. Also tell them if you smoke, drink alcohol, or use illegal drugs. Some items may interact with your medicine. What should I watch for while using this medicine? You may get drowsy or dizzy. Do not drive, use machinery, or do anything that needs mental alertness until you know how this medicine affects you. Do not stand or sit up quickly, especially if you are an older patient. This reduces the risk of dizzy or fainting spells. In some cases, you may be given additional medicines to help with side effects. Follow all directions for their use. Call your doctor or health care professional for advice if you get a fever, chills or sore throat, or other symptoms of a cold or flu. Do not treat yourself. This drug decreases your body's ability to fight infections. Try to avoid being around people who are sick. This medicine may increase your risk to bruise or bleed. Call your doctor or health care professional if you notice any unusual bleeding. You may need blood work done while you  are taking this medicine. In some patients, this medicine may cause a serious brain infection that may cause death. If you have any problems seeing, thinking, speaking, walking, or standing, tell your doctor right away. If you cannot reach your doctor, urgently seek other source of medical care. Check with your doctor or health care professional if you get an attack of severe diarrhea, nausea and vomiting, or if you sweat a lot. The loss of too much body fluid can make it dangerous for you to take this medicine. Do not become pregnant while taking this medicine or for at least 2 months after stopping it. Women should inform their doctor if they wish to become pregnant or think they might be pregnant. Men should not father a child while taking this medicine and for at least 2 months after stopping it. There is a potential for serious side effects to an unborn child. Talk to your health care professional or pharmacist for more information. Do not breast-feed an infant while taking this medicine or for 2 months after stopping it. This medicine may interfere with the ability to have a child. You should talk with your doctor or health care professional if you are concerned about your fertility. What side effects may I notice from receiving this medicine? Side effects that you should report to your doctor or health care professional as soon as possible: -allergic reactions like skin rash, itching or hives, swelling of the face, lips, or tongue -breathing problems -changes in hearing -changes in vision -fast, irregular heartbeat -feeling faint or lightheaded, falls -pain, tingling, numbness in the hands or feet -right upper belly pain -seizures -swelling of the ankles, feet, hands -unusual bleeding or bruising -unusually weak or tired -vomiting -yellowing of the eyes or skin Side effects that usually do not require medical attention (report to your doctor or health care professional if they continue or  are bothersome): -changes in emotions or moods -constipation -diarrhea -loss of appetite -headache -irritation at site where injected -nausea This list may not describe all possible side effects. Call your doctor for medical advice about side effects. You may report side effects to FDA at 1-800-FDA-1088. Where should I keep my medicine? This drug is given in a hospital or clinic and will not be stored at home. NOTE: This sheet is a summary. It may not cover all possible information. If you have  questions about this medicine, talk to your doctor, pharmacist, or health care provider.  2018 Elsevier/Gold Standard (2016-05-15 15:53:51)

## 2017-10-30 NOTE — Telephone Encounter (Signed)
appts already scheduled per 5/3 los.

## 2017-11-01 NOTE — Assessment & Plan Note (Signed)
67 y.o. female with chronic anemia and recent leukopenia leading to bone marrow biopsy which surprisingly revealed presence of a plasma cell neoplasm, most likely multiple myeloma.  There is a high percent of bone marrow replacement with a disease and it requires additional urgent assessment to properly stage the illness and to decide on the treatment strategy.  Based on patient's past medical history and current performance status, she would be a candidate for pursuing aggressive approach consisting of induction therapy with triple regimen followed by consolidation therapy with high-dose chemo and autologous stem cell rescue.  Current plan includes additional laboratory assessment as outlined below as well as imaging with PET/CT to assess for presence of significant skeletal abnormalities.  My preference is to treat patient with triple therapy combining lenalidomide, bortezomib, and low-dose dexamethasone with 3 cycles of 21-day therapy followed by restaging bone marrow biopsy and 1-3 additional cycles of treatment depending on the response.  Plan: -Multiple myeloma labs as outlined below. -PET/CT for disease staging. -Consult cancer center dentistry for dental assessment prior to initiation of zoledronic acid for skeletal support. -Patient has limited financial means and needs to be evaluated by our financial assistance and social work for possible need of assistance during the treatment. -Return to clinic in 2 weeks to possibly initiate RVD therapy.

## 2017-11-01 NOTE — Progress Notes (Signed)
[facility] Follow-up Visit:  Assessment: Multiple myeloma not having achieved remission ([place]) 67 y.o. female with chronic anemia and recent leukopenia leading to bone marrow biopsy which surprisingly revealed presence of a plasma cell neoplasm, most likely multiple myeloma.  There is a high percent of bone marrow replacement with a disease and it requires additional urgent assessment to properly stage the illness and to decide on the treatment strategy.  Based on patient's past medical history and current performance status, she would be a candidate for pursuing aggressive approach consisting of induction therapy with triple regimen followed by consolidation therapy with high-dose chemo and autologous stem cell rescue.  Current plan includes additional laboratory assessment as outlined below as well as imaging with PET/CT to assess for presence of significant skeletal abnormalities.  My preference is to treat patient with triple therapy combining lenalidomide, bortezomib, and low-dose dexamethasone with 3 cycles of 21-day therapy followed by restaging bone marrow biopsy and 1-3 additional cycles of treatment depending on the response.  Plan: -Multiple myeloma labs as outlined below. -PET/CT for disease staging. -Consult cancer center dentistry for dental assessment prior to initiation of zoledronic acid for skeletal support. -Patient has limited financial means and needs to be evaluated by our financial assistance and social work for possible need of assistance during the treatment. -Return to clinic in 2 weeks to possibly initiate RVD therapy.   Voice recognition software was used and creation of this note. Despite my best effort at editing the text, some misspelling/errors may have occurred.  No orders of the defined types were placed in this encounter.   Cancer Staging Multiple myeloma not having achieved remission ([place]) Staging form: Plasma Cell Myeloma and Plasma  Cell Disorders, AJCC 8th Edition - Clinical: High-risk cytogenetics: Absent - Unsigned   All questions were answered.  . The patient knows to call the clinic with any problems, questions or concerns.  This note was electronically signed.    History of Presenting Illness [name] is a 67 y.o. female followed in the [facility] for neutropenia and iron deficiency anemia, referred by Dr Deland Pretty.  Patient's past medical history is significant for history of 2 episodes of breast cancer on the right side initially treated with lumpectomy followed by mastectomy and chemoradiotherapy administered by Dr. Jana Hakim due to cancer recurrence, history of hepatitis C, depression, hypertension, fibromyalgia, insomnia.  Patient's previous surgeries include cholecystectomy, total abdominal hysterectomy with bilateral salpingo-oophorectomy and right total hip replacement in 2017.  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  Currently taking no over-the-counter medications or herbal supplements, also is not taking oral iron supplement due to significant constipation.  Family history significant for sister with breast cancer and factor V Leiden mutation.  Patient returns to the clinic for additional review of the recent multiple myeloma diagnosis and treatment recommendations.  She denies any new complaints since the last visit to the clinic.  At the present time, patient denies any fevers, chills, night sweats.  Denies any weakness or sensory deficits in the hands, or extremities.  Denies shortness of breath, chest pain or cough.  No nausea, vomiting, abdominal pain, diarrhea, constipation.  No dysuria or hematuria.  Patient denies hematochezia, melena, epistaxis, hemoptysis, or hematemesis..  Oncological/hematological History: --Labs, 03/10/14: WBC 7.8,  Hgb 11.7, MCV 82.0, MCH 27.0, MCHC 32.9, RDW 14.9,  Plt 240;                                                          Vit D-25OH 39.5 --Labs, 08/25/14: WBC 8.1,                                                 Hgb 10.3, MCV 80.5, MCH 24.5, MCHC 30.4, RDW 15.8, Plt 255;                                                          Vit D-25OH 48.4 --Labs, 04/10/16: WBC 6.7, ANC 3.1, ALC 3.0, Mono 0.5, Hgb   8.9, MCV 94.0, MCH 28.3, MCHC 30.2, RDW 14.0, Plt 266; Fe 46, FeSat 13%, TIBC 360, Transferrin 329 --Labs, 04/14/17: WBC 3.4,                                                 Hgb 10.9, MCV 89.7, MCH 28.7, MCHC 32.0, RDW 16.0, Plt 190; Fe 58, FeSat 13%, TIBC 448,        Vit D-25OH 29.0 --Labs, 05/13/17: FOBT -- negative x3 --Labs, 08/28/17: WBC 3.8, ANC 1.8, ALC 1.8, Mono 0.2, Hgb   9.7, MCV 91.8, MCH 29.3, MCHC 31.9, RDW 15.8, Plt 205; Fe 66, FeSat 14%, TIBC 476, Ferritin 19 --Treatment:   --Ferrous gluconate 351m PO Qday-TID, 09/04/17-: Metamucil for constipation     Multiple myeloma not having achieved remission ([place]   09/22/2017 Initial Diagnosis    Multiple myeloma not having achieved remission ([facility]      09/22/2017 Bone Marrow Biopsy    Pathology: Hypercellular bone marrow with marked elevation of kappa light chain restricted plasma cells accounting for 57% of cellularity. CytoGen: Normal cytogenetics FISH: Positive for-4,-14,13q-      10/08/2017 Tumor Marker    tProt 6.9, Alb 3.3, Ca 9.1, Cr 0.7, AP 90 LDH ..., beta-2 microglobulin ...; SPEP ...      10/21/2017 -  Chemotherapy    The patient had bortezomib SQ (VELCADE) chemo injection 2.5 mg, 1.3 mg/m2 = 2.5 mg, Subcutaneous,  Once, 1 of 4 cycles Administration: 2.5 mg (10/23/2017), 2.5 mg (10/30/2017), 2.5 mg (10/27/2017)  for chemotherapy treatment.        Medical History: Past Medical History:  Diagnosis Date  . Anemia    2016 on iron for a while   . Anxiety   . ARTHRITIS 10/25/2007  . Arthritis   . Blood transfusion without reported diagnosis   . BREAST CANCER, HX OF 08/03/2006   . Cancer ([place]    hx of breast ca x 2 on right   . DEPRESSION 08/03/2006  . Diabetes ([place]    prediabetic   . DIABETES MELLITUS, TYPE II 08/03/2006  . DYSPNEA ON EXERTION 11/21/2009   shortness of  breath with exertion   . EPISTAXIS, RECURRENT 01/27/2008  . FIBROMYALGIA 01/13/2008  . GERD 04/30/2008  . HEPATITIS C 08/03/2006  . HIP PAIN, RIGHT 04/30/2007  . History of detached retina repair 02/12/2011  . HYPERTENSION 04/30/2008  . INSOMNIA-SLEEP DISORDER-UNSPEC 11/21/2009  . KNEE SPRAIN, ACUTE 01/27/2008  . LEG PAIN, LEFT 01/13/2008  . OSTEOPENIA 07/26/2007  . Persistent vomiting 08/17/2008  . Pneumonia    hx of   . Prediabetes     Surgical History: Past Surgical History:  Procedure Laterality Date  . ABDOMINAL HYSTERECTOMY    . BREAST LUMPECTOMY  2000   right with axillary LND  . CHOLECYSTECTOMY  06/05/2012   Procedure: LAPAROSCOPIC CHOLECYSTECTOMY;  Surgeon: Adin Hector, MD;  Location: [facility];  Service: General;  Laterality: N/A;  . LAPAROSCOPIC LYSIS OF ADHESIONS  06/05/2012   Procedure: LAPAROSCOPIC LYSIS OF ADHESIONS;  Surgeon: Adin Hector, MD;  Location: [facility];  Service: General;  Laterality: N/A;  . MASTECTOMY  2007   Dr. Margot Chimes  . s/p right broken leg with surgury as teen    . s/p thyroid FNA neg.    . TONSILLECTOMY    . TOTAL HIP ARTHROPLASTY Right 01/25/2016   Procedure: RIGHT TOTAL HIP ARTHROPLASTY ANTERIOR APPROACH;  Surgeon: Mcarthur Rossetti, MD;  Location: [facility];  Service: Orthopedics;  Laterality: Right;  . TUBAL LIGATION      Family History: Family History  Problem Relation Age of Onset  . Heart disease Mother   . Cancer Sister        breast  . Depression Father   . Depression Brother     Social History: Social History   Socioeconomic History  . Marital status: Divorced    Spouse name: Not on file  . Number of children: Not on file  . Years of education: Not on file  . Highest education level: Not on file  Occupational History  .  Occupation: Sports coach  Social Needs  . Financial resource strain: Not on file  . Food insecurity:    Worry: Not on file    Inability: Not on file  . Transportation needs:    Medical: Not on file    Non-medical: Not on file  Tobacco Use  . Smoking status: Former Smoker    Packs/day: 4.00    Years: 40.00    Pack years: 160.00    Types: Cigarettes    Last attempt to quit: 06/30/2005    Years since quitting: 12.3  . Smokeless tobacco: Never Used  . Tobacco comment: Pt smoked up to 4 ppd  Substance and Sexual Activity  . Alcohol use: Yes    Alcohol/week: 0.0 oz    Comment: rare  . Drug use: No  . Sexual activity: Not Currently  Lifestyle  . Physical activity:    Days per week: Not on file    Minutes per session: Not on file  . Stress: Not on file  Relationships  . Social connections:    Talks on phone: Not on file    Gets together: Not on file    Attends religious service: Not on file    Active member of club or organization: Not on file    Attends meetings of clubs or organizations: Not on file    Relationship status: Not on file  . Intimate partner violence:    Fear of current or ex partner: Not on file    Emotionally abused: Not on file    Physically abused:  Not on file    Forced sexual activity: Not on file  Other Topics Concern  . Not on file  Social History Narrative  . Not on file    Allergies: Allergies  Allergen Reactions  . Ace Inhibitors Cough  . Contrast Media [Iodinated Diagnostic Agents]     Hallucinations  . Iohexol      Desc: Pt has been pre medicated with prednisone before scans, pt is unsure of type of contrast she reacted to.   . Tape Rash    "Tears my skin all up"    Medications:  Current Outpatient Medications  Medication Sig Dispense Refill  . acyclovir (ZOVIRAX) 400 MG tablet Take 1 tablet (400 mg total) by mouth 2 (two) times daily. 60 tablet 3  . amitriptyline (ELAVIL) 25 MG tablet Take 1 tablet (25 mg total) by mouth at  bedtime. 90 tablet 1  . ARIPiprazole (ABILIFY) 2 MG tablet Take 1 tablet (2 mg total) by mouth daily. Start in the morning 90 tablet 1  . aspirin EC 81 MG tablet Take 162 mg by mouth daily.    . cholestyramine (QUESTRAN) 4 GM/DOSE powder Take 1 packet (4 g total) by mouth 2 (two) times daily with a meal. 378 g 11  . dexamethasone (DECADRON) 4 MG tablet Take 2 and a half tablets (10 mg) on the day before and the day of Velcade chemotherapy. Repeat every 21 days. 20 tablet 3  . diphenhydrAMINE (BENADRYL) 25 MG tablet Take 25 mg by mouth every 6 (six) hours as needed.    . DULoxetine (CYMBALTA) 30 MG capsule Take 1 capsule (30 mg total) by mouth daily. Take with 60 mg capsule for a total of 90 mg daily 90 capsule 3  . DULoxetine (CYMBALTA) 60 MG capsule Take 1 capsule (60 mg total) by mouth daily. Take with 30 mg capsule for a total of 90 mg daily 90 capsule 3  . gabapentin (NEURONTIN) 300 MG capsule Take 2 capsules (600 mg total) by mouth 4 (four) times daily. 720 capsule 3  . lenalidomide (REVLIMID) 25 MG capsule Take 1 capsule (58m) by mouth once daily for 14 days on, 7 days off, repeat every 21 days. 14 capsule 0  . lidocaine-prilocaine (EMLA) cream Apply to affected area once 30 g 3  . LORazepam (ATIVAN) 0.5 MG tablet Take 1 tablet (0.5 mg total) by mouth every 6 (six) hours as needed (Nausea or vomiting). 30 tablet 0  . Multiple Vitamins-Minerals (MULTIVITAMIN WITH MINERALS) tablet Take 1 tablet by mouth daily.    .Marland Kitchenomeprazole (PRILOSEC) 20 MG capsule Take 2 capsules (40 mg total) by mouth daily. (Patient taking differently: Take 20 mg by mouth 2 (two) times daily. ) 180 capsule 3  . ondansetron (ZOFRAN) 8 MG tablet Take 1 tablet (8 mg total) by mouth 2 (two) times daily as needed (Nausea or vomiting). 30 tablet 1  . oxyCODONE-acetaminophen (PERCOCET) 10-325 MG tablet Take 1 tablet by mouth 5 (five) times daily.    . prochlorperazine (COMPAZINE) 10 MG tablet Take 1 tablet (10 mg total) by mouth  every 6 (six) hours as needed (Nausea or vomiting). 30 tablet 1  . temazepam (RESTORIL) 30 MG capsule Take 1 capsule (30 mg total) by mouth Nightly. 90 capsule 1   No current facility-administered medications for this visit.     Review of Systems: Review of Systems  All other systems reviewed and are negative.    PHYSICAL EXAMINATION Blood pressure 124/74, pulse (!) 114, temperature 98.7  F (37.1 C), temperature source Oral, resp. rate 17, height 5' 0.5" (1.537 m), weight 179 lb (81.2 kg), SpO2 99 %.  ECOG PERFORMANCE STATUS: 2 - Symptomatic, <50% confined to bed  Physical Exam  Constitutional: She is oriented to person, place, and time and well-developed, well-nourished, and in no distress. No distress.  HENT:  Head: Normocephalic and atraumatic.  Mouth/Throat: Oropharynx is clear and moist. No oropharyngeal exudate.  Eyes: Pupils are equal, round, and reactive to light. Conjunctivae and EOM are normal. No scleral icterus.  Neck: No thyromegaly present.  Cardiovascular: Normal rate, regular rhythm and normal heart sounds.  No murmur heard. Pulmonary/Chest: Effort normal and breath sounds normal. No respiratory distress. She has no wheezes. She has no rales.  Abdominal: Soft. Bowel sounds are normal. She exhibits no distension and no mass. There is no tenderness. There is no guarding.  Musculoskeletal: She exhibits no edema.  Lymphadenopathy:    She has no cervical adenopathy.  Neurological: She is alert and oriented to person, place, and time. She has normal reflexes. No cranial nerve deficit.  Skin: Skin is warm and dry. No rash noted. She is not diaphoretic. No erythema. No pallor.     LABORATORY DATA: I have personally reviewed the data as listed: Office Visit on 10/08/2017  Component Date Value Ref Range Status  . Beta-2 Microglobulin 10/08/2017 2.8* 0.6 - 2.4 mg/L Final   Comment: (NOTE) Siemens Immulite 2000 Immunochemiluminometric assay (ICMA) Values obtained with  different assay methods or kits cannot be used interchangeably. Results cannot be interpreted as absolute evidence of the presence or absence of malignant disease. Performed At: [facility] [place], [place] [id number] Rush Farmer MD [id number] Performed at [facility], [place] [address] [place], [place] [address]   . Kappa free light chain 10/08/2017 1,230.9* 3.3 - 19.4 mg/L Final  . Lamda free light chains 10/08/2017 6.3  5.7 - 26.3 mg/L Final  . Kappa, lamda light chain ratio 10/08/2017 195.38* 0.26 - 1.65 Final   Comment: (NOTE) Performed At: [facility] [place], [place] [id number] Rush Farmer MD [id number] Performed at [facility], [place] [address] [place], [place] [address]   . LDH 10/08/2017 151  125 - 245 U/L Final   Performed at [facility], [place] [address], [place], [place] [address]  . Total Protein, Urine 10/08/2017 6.0  Not Estab. mg/dL Final  . Total Protein, Urine-Ur/day 10/08/2017 99  30 - 150 mg/24 hr Final  . Albumin, U 10/08/2017 18.4  % Final  . ALPHA 1 URINE 10/08/2017 7.2  % Final  . Alpha 2, Urine 10/08/2017 18.3  % Final  . % BETA, Urine 10/08/2017 21.1  % Final  . GAMMA GLOBULIN URINE 10/08/2017 35.0  % Final  . Free Kappa Lt Chains,Ur 10/08/2017 66.90* 1.35 - 24.19 mg/L Final  . Free Lambda Lt Chains,Ur 10/08/2017 0.76  0.24 - 6.66 mg/L Corrected   **Results verified by repeat testing**  . Free Kappa/Lambda Ratio 10/08/2017 88.03* 2.04 - 10.37 Corrected   Comment: (NOTE) Performed At: [facility] [address] [place], [place] [id number] Rush Farmer MD [id number]   . Immunofixation Result, Urine 10/08/2017 Comment   Corrected   Bence Jones Protein positive; kappa type.  . Total Volume 10/08/2017 1,650   Final  . M-SPIKE %, Urine 10/08/2017 14.9* Not Observed % Corrected  . M-Spike, Mg/24 Hr 10/08/2017  15* Not Observed mg/24 hr Corrected  .  Note: 10/08/2017 Comment   Corrected   Comment: (NOTE) Protein electrophoresis scan will follow via computer, mail, or courier delivery. Performed at [facility], [place] [address] [place], [place] [address]   Appointment on 10/08/2017  Component Date Value Ref Range Status  . Ferritin 10/08/2017 33  9 - 269 ng/mL Final   Performed at [facility], [place] [address], [place], [place] [address]  . Iron 10/08/2017 64  41 - 142 ug/dL Final  . TIBC 10/08/2017 413  236 - 444 ug/dL Final  . Saturation Ratios 10/08/2017 16* 21 - 57 % Final  . UIBC 10/08/2017 348  ug/dL Final   Performed at [facility], [place] [address], [place], [place] [address]  . Sodium 10/08/2017 139  136 - 145 mmol/L Final  . Potassium 10/08/2017 3.9  3.5 - 5.1 mmol/L Final  . Chloride 10/08/2017 104  98 - 109 mmol/L Final  . CO2 10/08/2017 23  22 - 29 mmol/L Final  . Glucose, Bld 10/08/2017 145* 70 - 140 mg/dL Final  . BUN 10/08/2017 10  7 - 26 mg/dL Final  . Creatinine 10/08/2017 0.73  0.60 - 1.10 mg/dL Final  . Calcium 10/08/2017 9.1  8.4 - 10.4 mg/dL Final  . Total Protein 10/08/2017 6.9  6.4 - 8.3 g/dL Final  . Albumin 10/08/2017 3.3* 3.5 - 5.0 g/dL Final  . AST 10/08/2017 40* 5 - 34 U/L Final  . ALT 10/08/2017 34  0 - 55 U/L Final  . Alkaline Phosphatase 10/08/2017 90  40 - 150 U/L Final  . Total Bilirubin 10/08/2017 0.3  0.2 - 1.2 mg/dL Final  . GFR, Est Non Af Am 10/08/2017 >60  >60 mL/min Final  . GFR, Est AFR Am 10/08/2017 >60  >60 mL/min Final   Comment: (NOTE) The eGFR has been calculated using the CKD EPI equation. This calculation has not been validated in all clinical situations. eGFR's persistently <60 mL/min signify possible Chronic Kidney Disease.   Georgiann Hahn gap 10/08/2017 12* 3 - 11 Final   Performed at [facility], [place] [address], [place], [place] [address]  .  WBC Count 10/08/2017 4.6  3.9 - 10.3 K/uL Final  . RBC 10/08/2017 3.46* 3.70 - 5.45 MIL/uL Final  . Hemoglobin 10/08/2017 10.3* 11.6 - 15.9 g/dL Final  . HCT 10/08/2017 33.0* 34.8 - 46.6 % Final  . MCV 10/08/2017 95.4  79.5 - 101.0 fL Final  . MCH 10/08/2017 29.8  25.1 - 34.0 pg Final  . MCHC 10/08/2017 31.2* 31.5 - 36.0 g/dL Final  . RDW 10/08/2017 16.8* 11.2 - 14.5 % Final  . Platelet Count 10/08/2017 182  145 - 400 K/uL Final  . Neutrophils Relative % 10/08/2017 45  % Final  . Neutro Abs 10/08/2017 2.1  1.5 - 6.5 K/uL Final  . Lymphocytes Relative 10/08/2017 47  % Final  . Lymphs Abs 10/08/2017 2.2  0.9 - 3.3 K/uL Final  . Monocytes Relative 10/08/2017 6  % Final  . Monocytes Absolute 10/08/2017 0.3  0.1 - 0.9 K/uL Final  . Eosinophils Relative 10/08/2017 2  % Final  . Eosinophils Absolute 10/08/2017 0.1  0.0 - 0.5 K/uL Final  . Basophils Relative 10/08/2017 0  % Final  . Basophils Absolute 10/08/2017 0.0  0.0 - 0.1 K/uL Final   Performed at [facility], [place] [address] [place], [place] [address]       Ardath Sax, MD

## 2017-11-03 ENCOUNTER — Inpatient Hospital Stay: Payer: PPO

## 2017-11-03 VITALS — BP 151/79 | HR 111 | Temp 98.4°F | Resp 16

## 2017-11-03 DIAGNOSIS — Z5111 Encounter for antineoplastic chemotherapy: Secondary | ICD-10-CM | POA: Diagnosis not present

## 2017-11-03 DIAGNOSIS — C9000 Multiple myeloma not having achieved remission: Secondary | ICD-10-CM

## 2017-11-03 MED ORDER — BORTEZOMIB CHEMO SQ INJECTION 3.5 MG (2.5MG/ML)
1.3000 mg/m2 | Freq: Once | INTRAMUSCULAR | Status: AC
  Administered 2017-11-03: 2.5 mg via SUBCUTANEOUS
  Filled 2017-11-03: qty 2.5

## 2017-11-03 MED ORDER — PROCHLORPERAZINE MALEATE 10 MG PO TABS
ORAL_TABLET | ORAL | Status: AC
  Filled 2017-11-03: qty 1

## 2017-11-03 MED ORDER — PROCHLORPERAZINE MALEATE 10 MG PO TABS
10.0000 mg | ORAL_TABLET | Freq: Once | ORAL | Status: AC
  Administered 2017-11-03: 10 mg via ORAL

## 2017-11-03 NOTE — Progress Notes (Signed)
Per Dr. Lebron Conners, it is ok to treat today with HR of 111.

## 2017-11-03 NOTE — Patient Instructions (Signed)
[facility] Discharge Instructions for Patients Receiving Chemotherapy  Today you received the following chemotherapy agents Velcade.  To help prevent nausea and vomiting after your treatment, we encourage you to take your nausea medication as directed.  If you develop nausea and vomiting that is not controlled by your nausea medication, call the clinic.   BELOW ARE SYMPTOMS THAT SHOULD BE REPORTED IMMEDIATELY:  *FEVER GREATER THAN 100.5 F  *CHILLS WITH OR WITHOUT FEVER  NAUSEA AND VOMITING THAT IS NOT CONTROLLED WITH YOUR NAUSEA MEDICATION  *UNUSUAL SHORTNESS OF BREATH  *UNUSUAL BRUISING OR BLEEDING  TENDERNESS IN MOUTH AND THROAT WITH OR WITHOUT PRESENCE OF ULCERS  *URINARY PROBLEMS  *BOWEL PROBLEMS  UNUSUAL RASH Items with * indicate a potential emergency and should be followed up as soon as possible.  Feel free to call the clinic should you have any questions or concerns. The clinic phone number is [phone].  Please show the CHEMO ALERT CARD at check-in to the Emergency Department and triage nurse.   

## 2017-11-06 ENCOUNTER — Other Ambulatory Visit: Payer: Self-pay | Admitting: Hematology and Oncology

## 2017-11-06 ENCOUNTER — Telehealth: Payer: Self-pay | Admitting: *Deleted

## 2017-11-06 DIAGNOSIS — C9000 Multiple myeloma not having achieved remission: Secondary | ICD-10-CM

## 2017-11-06 NOTE — Telephone Encounter (Signed)
Received message from patient requesting revlimid refill, due to restart 5/17 per patient.  Dr. Lebron Conners on [place] today, office note not completed stating patient tolerating revlimid.  Will defer refill for Dr. Lebron Conners on Monday.  Attempted to call patient with information, no answer.  Voicemail box currently full, unable to leave message.

## 2017-11-06 NOTE — Telephone Encounter (Signed)
Call from patient to follow up on revlimid refill request. She will be due to resume Revlimid on 5/17.

## 2017-11-06 NOTE — Telephone Encounter (Signed)
Informed pt that refill request will be handled when MD is back in office on 5/13. She voiced understanding.

## 2017-11-09 ENCOUNTER — Other Ambulatory Visit: Payer: Self-pay

## 2017-11-09 DIAGNOSIS — C9000 Multiple myeloma not having achieved remission: Secondary | ICD-10-CM

## 2017-11-09 MED ORDER — LENALIDOMIDE 25 MG PO CAPS: ORAL_CAPSULE | ORAL | 0 refills | Status: DC

## 2017-11-12 NOTE — Progress Notes (Signed)
[place] [facility] Follow-up Visit:  Assessment: Multiple myeloma not having achieved remission [facility]) 67 y.o. female with new diagnosis of multiple myeloma, R-ISS stage II, no high risk cytogenetics.  Imaging with PET/CT demonstrates no gross radiographic disease evidence.  Bone marrow biopsy showed 57% involvement with plasma cell process.  Patient has significant dysproteinemia with kappa light chain to lambda light chain ratio exceeding 100.  Unfortunately, serum protein electrophoresis and quantitative immunoglobulins have not been processed correctly and are not available at this time.  We have discussed these results with the patient previously and strongly recommended initiation of systemic therapy for what appears to be active multiple myeloma based on percentage of bone marrow involvement and kappa to lambda ratio.  Cytopenias are also likely attributable to the underlying plasma cell disorder.  Our plan is to proceed with induction therapy including lenalidomide, bortezomib, low-dose dexamethasone.  Based on patient's overall health and age, she is considered to be a candidate for high intensity consolidation therapy.  Our plan is to proceed with 3 cycles of induction followed by restaging bone marrow biopsy and possible 1-3 additional cycles of treatment after which patient will be referred for high-dose chemotherapy with autologous stem cell rescue consolidation.  Today, we have reviewed the results of studies obtained as well as recommended therapy including component medications, schedule of administration, expected benefits, and potential risks of the patient.  Patient signed informed consent to proceed with therapy as recommended.  Patient has been cleared by dentistry to proceed with treatment including administration of zoledronic acid.  Plan: -Proceed with cycle 1, day 1 of lenalidomide, bortezomib, low-dose dexamethasone.  Administer zoledronic acid today.  Will treat  with zoledronic acid every 6 weeks for convenience. -Disease assessment will be conducted with myeloma panel obtained with each cycle of therapy as well as a restaging bone marrow biopsy obtained after completion of 3 cycles of systemic therapy. -Return to clinic in 1 weeks for toxicity assessment and continuation of RVD chemotherapy..   Voice recognition software was used and creation of this note. Despite my best effort at editing the text, some misspelling/errors may have occurred.  No orders of the defined types were placed in this encounter.   Cancer Staging Multiple myeloma not having achieved remission ([place]) Staging form: Plasma Cell Myeloma and Plasma Cell Disorders, AJCC 8th Edition - Clinical: High-risk cytogenetics: Absent - Unsigned   All questions were answered.  . The patient knows to call the clinic with any problems, questions or concerns.  This note was electronically signed.    History of Presenting Illness [name] is a 67 y.o. female followed in the [facility] for neutropenia and iron deficiency anemia, referred by Dr Deland Pretty.  Patient's past medical history is significant for history of 2 episodes of breast cancer on the right side initially treated with lumpectomy followed by mastectomy and chemoradiotherapy administered by Dr. Jana Hakim due to cancer recurrence, history of hepatitis C, depression, hypertension, fibromyalgia, insomnia.  Patient's previous surgeries include cholecystectomy, total abdominal hysterectomy with bilateral salpingo-oophorectomy and right total hip replacement in 2017.  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  Currently taking no over-the-counter medications or herbal supplements, also is not taking oral iron supplement due to significant constipation.  Family history significant for sister with breast cancer and factor V Leiden mutation.  Patient returns to the clinic for  additional review of the recent multiple myeloma diagnosis and treatment  recommendations.  She denies any new complaints since the last visit to the clinic.  At the present time, patient denies any fevers, chills, night sweats.  Denies any weakness or sensory deficits in the hands, or extremities.  Denies shortness of breath, chest pain or cough.  No nausea, vomiting, abdominal pain, diarrhea, constipation.  No dysuria or hematuria.  Patient denies hematochezia, melena, epistaxis, hemoptysis, or hematemesis..  Oncological/hematological History: --Labs, 03/10/14: WBC 7.8,                                                 Hgb 11.7, MCV 82.0, MCH 27.0, MCHC 32.9, RDW 14.9, Plt 240;                                                          Vit D-25OH 39.5 --Labs, 08/25/14: WBC 8.1,                                                 Hgb 10.3, MCV 80.5, MCH 24.5, MCHC 30.4, RDW 15.8, Plt 255;                                                          Vit D-25OH 48.4 --Labs, 04/10/16: WBC 6.7, ANC 3.1, ALC 3.0, Mono 0.5, Hgb   8.9, MCV 94.0, MCH 28.3, MCHC 30.2, RDW 14.0, Plt 266; Fe 46, FeSat 13%, TIBC 360, Transferrin 329 --Labs, 04/14/17: WBC 3.4,                                                 Hgb 10.9, MCV 89.7, MCH 28.7, MCHC 32.0, RDW 16.0, Plt 190; Fe 58, FeSat 13%, TIBC 448,        Vit D-25OH 29.0 --Labs, 05/13/17: FOBT -- negative x3 --Labs, 08/28/17: WBC 3.8, ANC 1.8, ALC 1.8, Mono 0.2, Hgb   9.7, MCV 91.8, MCH 29.3, MCHC 31.9, RDW 15.8, Plt 205; Fe 66, FeSat 14%, TIBC 476, Ferritin 19 --Treatment:   --Ferrous gluconate 386m PO Qday-TID, 09/04/17-: Metamucil for constipation     Multiple myel[place] not having achieved remission ([place]   09/22/2017 Initial Diagnosis    Multiple myeloma not having achieved remission ([place]      09/22/2017 Bone Marrow Biopsy    Pathology: Hypercellular bone marrow with marked elevation of kappa light chain restricted plasma cells accounting for 57% of cellularity. CytoGen:  Normal cytogenetics FISH: Positive for-4,-14,13q-      10/08/2017 Tumor Marker    tProt 6.9, Alb 3.3, Ca 9.1, Cr 0.7, AP 90 LDH 151, beta-2 microglobulin 2.8; SPEP ...; IgG ..., IgA ..., IgM ...; kappa 1231, lambda 6.3, KLR 195       10/16/2017 PET scan    No focal  hypermetabolic lesions identified. Large solid-appearing nodule in left lobe of thyroid gland noted. 67m left upper lobe lung nodule noted.      10/23/2017 -  Chemotherapy    Bortezomib SQ (VELCADE) 1.3 mg/m2 SQ d1,4,8,15 + Lenalidomide 247mPO QDay d1-15, + Dexamethasone 4038mO QWeek Q21d --Cycle #1, 10/23/17:       10/23/2017 Cancer Staging    Staging form: Plasma Cell Myeloma and Plasma Cell Disorders, AJCC 8th Edition - Clinical stage from 10/23/2017: RISS Stage II (Beta-2-microglobulin (mg/L): 2.8, Albumin (g/dL): 3.3, ISS: Stage II, High-risk cytogenetics: Absent, LDH: Normal) - Signed by PerArdath SaxD on 11/12/2017       Medical History: Past Medical History:  Diagnosis Date  . Anemia    2016 on iron for a while   . Anxiety   . ARTHRITIS 10/25/2007  . Arthritis   . Blood transfusion without reported diagnosis   . BREAST CANCER, HX OF 08/03/2006  . Cancer ([facility]  hx of breast ca x 2 on right   . DEPRESSION 08/03/2006  . Diabetes ([facility]  prediabetic   . DIABETES MELLITUS, TYPE II 08/03/2006  . DYSPNEA ON EXERTION 11/21/2009   shortness of breath with exertion   . EPISTAXIS, RECURRENT 01/27/2008  . FIBROMYALGIA 01/13/2008  . GERD 04/30/2008  . HEPATITIS C 08/03/2006  . HIP PAIN, RIGHT 04/30/2007  . History of detached retina repair 02/12/2011  . HYPERTENSION 04/30/2008  . INSOMNIA-SLEEP DISORDER-UNSPEC 11/21/2009  . KNEE SPRAIN, ACUTE 01/27/2008  . LEG PAIN, LEFT 01/13/2008  . OSTEOPENIA 07/26/2007  . Persistent vomiting 08/17/2008  . Pneumonia    hx of   . Prediabetes     Surgical History: Past Surgical History:  Procedure Laterality Date  . ABDOMINAL HYSTERECTOMY    . BREAST LUMPECTOMY  2000    right with axillary LND  . CHOLECYSTECTOMY  06/05/2012   Procedure: LAPAROSCOPIC CHOLECYSTECTOMY;  Surgeon: SteAdin HectorD;  Location: [facility];  Service: General;  Laterality: N/A;  . LAPAROSCOPIC LYSIS OF ADHESIONS  06/05/2012   Procedure: LAPAROSCOPIC LYSIS OF ADHESIONS;  Surgeon: SteAdin HectorD;  Location: [facility];  Service: General;  Laterality: N/A;  . MASTECTOMY  2007   Dr. StrMargot Chimes s/p right broken leg with surgury as teen    . s/p thyroid FNA neg.    . TONSILLECTOMY    . TOTAL HIP ARTHROPLASTY Right 01/25/2016   Procedure: RIGHT TOTAL HIP ARTHROPLASTY ANTERIOR APPROACH;  Surgeon: ChrMcarthur RossettiD;  Location: [facility];  Service: Orthopedics;  Laterality: Right;  . TUBAL LIGATION      Family History: Family History  Problem Relation Age of Onset  . Heart disease Mother   . Cancer Sister        breast  . Depression Father   . Depression Brother     Social History: Social History   Socioeconomic History  . Marital status: Divorced    Spouse name: Not on file  . Number of children: Not on file  . Years of education: Not on file  . Highest education level: Not on file  Occupational History  . Occupation: MedSports coachocial Needs  . Financial resource strain: Not on file  . Food insecurity:    Worry: Not on file    Inability: Not on file  . Transportation needs:    Medical: Not on file    Non-medical: Not on file  Tobacco Use  . Smoking status: Former  Smoker    Packs/day: 4.00    Years: 40.00    Pack years: 160.00    Types: Cigarettes    Last attempt to quit: 06/30/2005    Years since quitting: 12.3  . Smokeless tobacco: Never Used  . Tobacco comment: Pt smoked up to 4 ppd  Substance and Sexual Activity  . Alcohol use: Yes    Alcohol/week: 0.0 oz    Comment: rare  . Drug use: No  . Sexual activity: Not Currently  Lifestyle  . Physical activity:    Days per week: Not on file    Minutes per session: Not on file  . Stress: Not on file   Relationships  . Social connections:    Talks on phone: Not on file    Gets together: Not on file    Attends religious service: Not on file    Active member of club or organization: Not on file    Attends meetings of clubs or organizations: Not on file    Relationship status: Not on file  . Intimate partner violence:    Fear of current or ex partner: Not on file    Emotionally abused: Not on file    Physically abused: Not on file    Forced sexual activity: Not on file  Other Topics Concern  . Not on file  Social History Narrative  . Not on file    Allergies: Allergies  Allergen Reactions  . Ace Inhibitors Cough  . Contrast Media [Iodinated Diagnostic Agents]     Hallucinations  . Iohexol      Desc: Pt has been pre medicated with prednisone before scans, pt is unsure of type of contrast she reacted to.   . Tape Rash    "Tears my skin all up"    Medications:  Current Outpatient Medications  Medication Sig Dispense Refill  . acyclovir (ZOVIRAX) 400 MG tablet Take 1 tablet (400 mg total) by mouth 2 (two) times daily. 60 tablet 3  . amitriptyline (ELAVIL) 25 MG tablet Take 1 tablet (25 mg total) by mouth at bedtime. 90 tablet 1  . ARIPiprazole (ABILIFY) 2 MG tablet Take 1 tablet (2 mg total) by mouth daily. Start in the morning 90 tablet 1  . aspirin EC 81 MG tablet Take 162 mg by mouth daily.    . cholestyramine (QUESTRAN) 4 GM/DOSE powder Take 1 packet (4 g total) by mouth 2 (two) times daily with a meal. 378 g 11  . dexamethasone (DECADRON) 4 MG tablet Take 2 and a half tablets (10 mg) on the day before and the day of Velcade chemotherapy. Repeat every 21 days. 20 tablet 3  . diphenhydrAMINE (BENADRYL) 25 MG tablet Take 25 mg by mouth every 6 (six) hours as needed.    . DULoxetine (CYMBALTA) 30 MG capsule Take 1 capsule (30 mg total) by mouth daily. Take with 60 mg capsule for a total of 90 mg daily 90 capsule 3  . DULoxetine (CYMBALTA) 60 MG capsule Take 1 capsule (60 mg  total) by mouth daily. Take with 30 mg capsule for a total of 90 mg daily 90 capsule 3  . gabapentin (NEURONTIN) 300 MG capsule Take 2 capsules (600 mg total) by mouth 4 (four) times daily. 720 capsule 3  . lidocaine-prilocaine (EMLA) cream Apply to affected area once 30 g 3  . LORazepam (ATIVAN) 0.5 MG tablet Take 1 tablet (0.5 mg total) by mouth every 6 (six) hours as needed (Nausea or vomiting). 30 tablet  0  . Multiple Vitamins-Minerals (MULTIVITAMIN WITH MINERALS) tablet Take 1 tablet by mouth daily.    Marland Kitchen omeprazole (PRILOSEC) 20 MG capsule Take 2 capsules (40 mg total) by mouth daily. (Patient taking differently: Take 20 mg by mouth 2 (two) times daily. ) 180 capsule 3  . ondansetron (ZOFRAN) 8 MG tablet Take 1 tablet (8 mg total) by mouth 2 (two) times daily as needed (Nausea or vomiting). 30 tablet 1  . oxyCODONE-acetaminophen (PERCOCET) 10-325 MG tablet Take 1 tablet by mouth 5 (five) times daily.    . prochlorperazine (COMPAZINE) 10 MG tablet Take 1 tablet (10 mg total) by mouth every 6 (six) hours as needed (Nausea or vomiting). 30 tablet 1  . temazepam (RESTORIL) 30 MG capsule Take 1 capsule (30 mg total) by mouth Nightly. 90 capsule 1  . lenalidomide (REVLIMID) 25 MG capsule TAKE 1 CAPSULE (25MG) BY MOUTH ONCE DAILY FOR 14 DAYS ON 7 DAYS OFF REPEAT EVERY 21 DAYS 14 capsule 0   No current facility-administered medications for this visit.     Review of Systems: Review of Systems  All other systems reviewed and are negative.    PHYSICAL EXAMINATION Blood pressure (!) 134/113, pulse 90, temperature 98.3 F (36.8 C), temperature source Oral, resp. rate 18, height 5' 0.05" (1.525 m), weight 179 lb 12.8 oz (81.6 kg), SpO2 99 %.  ECOG PERFORMANCE STATUS: 2 - Symptomatic, <50% confined to bed  Physical Exam  Constitutional: She is oriented to person, place, and time and well-developed, well-nourished, and in no distress. No distress.  HENT:  Head: Normocephalic and atraumatic.   Mouth/Throat: Oropharynx is clear and moist. No oropharyngeal exudate.  Eyes: Pupils are equal, round, and reactive to light. Conjunctivae and EOM are normal. No scleral icterus.  Neck: No thyromegaly present.  Cardiovascular: Normal rate, regular rhythm and normal heart sounds.  No murmur heard. Pulmonary/Chest: Effort normal and breath sounds normal. No respiratory distress. She has no wheezes. She has no rales.  Abdominal: Soft. Bowel sounds are normal. She exhibits no distension and no mass. There is no tenderness. There is no guarding.  Musculoskeletal: She exhibits no edema.  Lymphadenopathy:    She has no cervical adenopathy.  Neurological: She is alert and oriented to person, place, and time. She has normal reflexes. No cranial nerve deficit.  Skin: Skin is warm and dry. No rash noted. She is not diaphoretic. No erythema. No pallor.     LABORATORY DATA: I have personally reviewed the data as listed: Appointment on 10/23/2017  Component Date Value Ref Range Status  . Magnesium 10/23/2017 1.8  1.7 - 2.4 mg/dL Final   Performed at [facility], [place] [address], [place], [place] [address]  . Sodium 10/23/2017 134* 136 - 145 mmol/L Final  . Potassium 10/23/2017 4.4  3.5 - 5.1 mmol/L Final  . Chloride 10/23/2017 104  98 - 109 mmol/L Final  . CO2 10/23/2017 20* 22 - 29 mmol/L Final  . Glucose, Bld 10/23/2017 165* 70 - 140 mg/dL Final  . BUN 10/23/2017 13  7 - 26 mg/dL Final  . Creatinine 10/23/2017 0.76  0.60 - 1.10 mg/dL Final  . Calcium 10/23/2017 8.9  8.4 - 10.4 mg/dL Final  . Total Protein 10/23/2017 7.1  6.4 - 8.3 g/dL Final  . Albumin 10/23/2017 3.6  3.5 - 5.0 g/dL Final  . AST 10/23/2017 26  5 - 34 U/L Final  . ALT 10/23/2017 26  0 - 55 U/L Final  . Alkaline  Phosphatase 10/23/2017 81  40 - 150 U/L Final  . Total Bilirubin 10/23/2017 0.3  0.2 - 1.2 mg/dL Final  . GFR, Est Non Af Am 10/23/2017 >60  >60 mL/min Final  . GFR, Est AFR Am 10/23/2017 >60   >60 mL/min Final   Comment: (NOTE) The eGFR has been calculated using the CKD EPI equation. This calculation has not been validated in all clinical situations. eGFR's persistently <60 mL/min signify possible Chronic Kidney Disease.   Georgiann Hahn gap 10/23/2017 10  3 - 11 Final   Performed at [facility], [place] [address], [place], [place] [address]  . WBC Count 10/23/2017 7.7  3.9 - 10.3 K/uL Final  . RBC 10/23/2017 3.26* 3.70 - 5.45 MIL/uL Final  . Hemoglobin 10/23/2017 9.9* 11.6 - 15.9 g/dL Final  . HCT 10/23/2017 31.1* 34.8 - 46.6 % Final  . MCV 10/23/2017 95.4  79.5 - 101.0 fL Final  . MCH 10/23/2017 30.4  25.1 - 34.0 pg Final  . MCHC 10/23/2017 31.8  31.5 - 36.0 g/dL Final  . RDW 10/23/2017 16.9* 11.2 - 14.5 % Final  . Platelet Count 10/23/2017 198  145 - 400 K/uL Final  . Neutrophils Relative % 10/23/2017 82  % Final  . Neutro Abs 10/23/2017 6.2  1.5 - 6.5 K/uL Final  . Lymphocytes Relative 10/23/2017 16  % Final  . Lymphs Abs 10/23/2017 1.3  0.9 - 3.3 K/uL Final  . Monocytes Relative 10/23/2017 2  % Final  . Monocytes Absolute 10/23/2017 0.2  0.1 - 0.9 K/uL Final  . Eosinophils Relative 10/23/2017 0  % Final  . Eosinophils Absolute 10/23/2017 0.0  0.0 - 0.5 K/uL Final  . Basophils Relative 10/23/2017 0  % Final  . Basophils Absolute 10/23/2017 0.0  0.0 - 0.1 K/uL Final   Performed at [facility], [place] [address], [place], [place] [address]       Ardath Sax, MD

## 2017-11-12 NOTE — Assessment & Plan Note (Signed)
67 y.o. female with new diagnosis of multiple myeloma, R-ISS stage II, no high risk cytogenetics.  Imaging with PET/CT demonstrates no gross radiographic disease evidence.  Bone marrow biopsy showed 57% involvement with plasma cell process.  Patient has significant dysproteinemia with kappa light chain to lambda light chain ratio exceeding 100.  Unfortunately, serum protein electrophoresis and quantitative immunoglobulins have not been processed correctly and are not available at this time.  We have discussed these results with the patient previously and strongly recommended initiation of systemic therapy for what appears to be active multiple myeloma based on percentage of bone marrow involvement and kappa to lambda ratio.  Cytopenias are also likely attributable to the underlying plasma cell disorder.  Our plan is to proceed with induction therapy including lenalidomide, bortezomib, low-dose dexamethasone.  Based on patient's overall health and age, she is considered to be a candidate for high intensity consolidation therapy.  Our plan is to proceed with 3 cycles of induction followed by restaging bone marrow biopsy and possible 1-3 additional cycles of treatment after which patient will be referred for high-dose chemotherapy with autologous stem cell rescue consolidation.  Today, we have reviewed the results of studies obtained as well as recommended therapy including component medications, schedule of administration, expected benefits, and potential risks of the patient.  Patient signed informed consent to proceed with therapy as recommended.  Patient has been cleared by dentistry to proceed with treatment including administration of zoledronic acid.  Plan: -Proceed with cycle 1, day 1 of lenalidomide, bortezomib, low-dose dexamethasone.  Administer zoledronic acid today.  Will treat with zoledronic acid every 6 weeks for convenience. -Disease assessment will be conducted with myeloma panel obtained with  each cycle of therapy as well as a restaging bone marrow biopsy obtained after completion of 3 cycles of systemic therapy. -Return to clinic in 1 weeks for toxicity assessment and continuation of RVD chemotherapy.. 

## 2017-11-13 ENCOUNTER — Inpatient Hospital Stay: Payer: PPO

## 2017-11-13 ENCOUNTER — Inpatient Hospital Stay (HOSPITAL_BASED_OUTPATIENT_CLINIC_OR_DEPARTMENT_OTHER): Payer: PPO | Admitting: Hematology and Oncology

## 2017-11-13 ENCOUNTER — Telehealth: Payer: Self-pay | Admitting: Hematology and Oncology

## 2017-11-13 VITALS — BP 91/76 | HR 81 | Temp 98.1°F | Resp 18 | Ht 60.5 in | Wt 183.4 lb

## 2017-11-13 DIAGNOSIS — Z9011 Acquired absence of right breast and nipple: Secondary | ICD-10-CM | POA: Diagnosis not present

## 2017-11-13 DIAGNOSIS — E8809 Other disorders of plasma-protein metabolism, not elsewhere classified: Secondary | ICD-10-CM | POA: Diagnosis not present

## 2017-11-13 DIAGNOSIS — Z853 Personal history of malignant neoplasm of breast: Secondary | ICD-10-CM

## 2017-11-13 DIAGNOSIS — C9000 Multiple myeloma not having achieved remission: Secondary | ICD-10-CM | POA: Diagnosis not present

## 2017-11-13 DIAGNOSIS — D509 Iron deficiency anemia, unspecified: Secondary | ICD-10-CM | POA: Diagnosis not present

## 2017-11-13 DIAGNOSIS — Z5111 Encounter for antineoplastic chemotherapy: Secondary | ICD-10-CM

## 2017-11-13 DIAGNOSIS — D709 Neutropenia, unspecified: Secondary | ICD-10-CM | POA: Diagnosis not present

## 2017-11-13 DIAGNOSIS — Z79899 Other long term (current) drug therapy: Secondary | ICD-10-CM | POA: Diagnosis not present

## 2017-11-13 DIAGNOSIS — Z5112 Encounter for antineoplastic immunotherapy: Secondary | ICD-10-CM

## 2017-11-13 LAB — CMP (CANCER CENTER ONLY)
ALT: 29 U/L (ref 0–55)
AST: 25 U/L (ref 5–34)
Albumin: 3.4 g/dL — ABNORMAL LOW (ref 3.5–5.0)
Alkaline Phosphatase: 75 U/L (ref 40–150)
Anion gap: 10 (ref 3–11)
BILIRUBIN TOTAL: 0.3 mg/dL (ref 0.2–1.2)
BUN: 12 mg/dL (ref 7–26)
CHLORIDE: 104 mmol/L (ref 98–109)
CO2: 22 mmol/L (ref 22–29)
Calcium: 8.9 mg/dL (ref 8.4–10.4)
Creatinine: 0.78 mg/dL (ref 0.60–1.10)
GFR, Est AFR Am: >60 mL/min (ref >60)
Glucose, Bld: 211 mg/dL — ABNORMAL HIGH (ref 70–140)
POTASSIUM: 5 mmol/L (ref 3.5–5.1)
Sodium: 136 mmol/L (ref 136–145)
TOTAL PROTEIN: 6.4 g/dL (ref 6.4–8.3)

## 2017-11-13 LAB — CBC WITH DIFFERENTIAL (CANCER CENTER ONLY)
BASOS ABS: 0 10*3/uL (ref 0.0–0.1)
Basophils Relative: 1 %
Eosinophils Absolute: 0 10*3/uL (ref 0.0–0.5)
Eosinophils Relative: 0 %
HEMATOCRIT: 29.6 % — AB (ref 34.8–46.6)
HEMOGLOBIN: 9.5 g/dL — AB (ref 11.6–15.9)
LYMPHS ABS: 0.7 10*3/uL — AB (ref 0.9–3.3)
LYMPHS PCT: 19 %
MCH: 29.5 pg (ref 25.1–34.0)
MCHC: 32.2 g/dL (ref 31.5–36.0)
MCV: 91.7 fL (ref 79.5–101.0)
Monocytes Absolute: 0.1 10*3/uL (ref 0.1–0.9)
Monocytes Relative: 3 %
NEUTROS PCT: 77 %
Neutro Abs: 2.8 10*3/uL (ref 1.5–6.5)
PLATELETS: 163 10*3/uL (ref 145–400)
RBC: 3.23 MIL/uL — AB (ref 3.70–5.45)
RDW: 17.6 % — ABNORMAL HIGH (ref 11.2–14.5)
WBC: 3.6 10*3/uL — AB (ref 3.9–10.3)

## 2017-11-13 LAB — MAGNESIUM: MAGNESIUM: 1.9 mg/dL (ref 1.7–2.4)

## 2017-11-13 MED ORDER — BORTEZOMIB CHEMO SQ INJECTION 3.5 MG (2.5MG/ML)
1.3000 mg/m2 | Freq: Once | INTRAMUSCULAR | Status: AC
  Administered 2017-11-13: 2.5 mg via SUBCUTANEOUS
  Filled 2017-11-13: qty 2.5

## 2017-11-13 MED ORDER — PROCHLORPERAZINE MALEATE 10 MG PO TABS
10.0000 mg | ORAL_TABLET | Freq: Once | ORAL | Status: AC
  Administered 2017-11-13: 10 mg via ORAL

## 2017-11-13 MED ORDER — PROCHLORPERAZINE MALEATE 10 MG PO TABS
ORAL_TABLET | ORAL | Status: AC
  Filled 2017-11-13: qty 1

## 2017-11-13 NOTE — Telephone Encounter (Signed)
Unable to schedule due to patient would like to discuss with family before she decides on who she will be sing for follow up per 5/17 los

## 2017-11-13 NOTE — Patient Instructions (Signed)
[facility] Discharge Instructions for Patients Receiving Chemotherapy  Today you received the following chemotherapy agents Velcade.  To help prevent nausea and vomiting after your treatment, we encourage you to take your nausea medication as directed.  If you develop nausea and vomiting that is not controlled by your nausea medication, call the clinic.   BELOW ARE SYMPTOMS THAT SHOULD BE REPORTED IMMEDIATELY:  *FEVER GREATER THAN 100.5 F  *CHILLS WITH OR WITHOUT FEVER  NAUSEA AND VOMITING THAT IS NOT CONTROLLED WITH YOUR NAUSEA MEDICATION  *UNUSUAL SHORTNESS OF BREATH  *UNUSUAL BRUISING OR BLEEDING  TENDERNESS IN MOUTH AND THROAT WITH OR WITHOUT PRESENCE OF ULCERS  *URINARY PROBLEMS  *BOWEL PROBLEMS  UNUSUAL RASH Items with * indicate a potential emergency and should be followed up as soon as possible.  Feel free to call the clinic should you have any questions or concerns. The clinic phone number is [phone].  Please show the CHEMO ALERT CARD at check-in to the Emergency Department and triage nurse.   

## 2017-11-17 ENCOUNTER — Encounter: Payer: Self-pay | Admitting: Pharmacist

## 2017-11-17 ENCOUNTER — Inpatient Hospital Stay: Payer: PPO

## 2017-11-17 ENCOUNTER — Other Ambulatory Visit: Payer: Self-pay

## 2017-11-17 VITALS — BP 119/68 | HR 99 | Temp 98.3°F | Resp 17 | Wt 176.5 lb

## 2017-11-17 DIAGNOSIS — C9000 Multiple myeloma not having achieved remission: Secondary | ICD-10-CM

## 2017-11-17 DIAGNOSIS — Z5111 Encounter for antineoplastic chemotherapy: Secondary | ICD-10-CM | POA: Diagnosis not present

## 2017-11-17 MED ORDER — PROCHLORPERAZINE MALEATE 10 MG PO TABS
ORAL_TABLET | ORAL | Status: AC
  Filled 2017-11-17: qty 1

## 2017-11-17 MED ORDER — ACYCLOVIR 400 MG PO TABS: 400.0000 mg | ORAL_TABLET | Freq: Two times a day (BID) | ORAL | 3 refills | Status: DC

## 2017-11-17 MED ORDER — PROCHLORPERAZINE MALEATE 10 MG PO TABS
10.0000 mg | ORAL_TABLET | Freq: Once | ORAL | Status: AC
  Administered 2017-11-17: 10 mg via ORAL

## 2017-11-17 MED ORDER — BORTEZOMIB CHEMO SQ INJECTION 3.5 MG (2.5MG/ML)
1.3000 mg/m2 | Freq: Once | INTRAMUSCULAR | Status: AC
  Administered 2017-11-17: 2.5 mg via SUBCUTANEOUS
  Filled 2017-11-17: qty 2.5

## 2017-11-17 NOTE — Patient Instructions (Signed)

## 2017-11-18 ENCOUNTER — Ambulatory Visit (HOSPITAL_COMMUNITY)
Admission: RE | Admit: 2017-11-18 | Discharge: 2017-11-18 | Disposition: A | Discharge status: Home / Self Care | Payer: PPO | Source: Ambulatory Visit | Attending: Hematology and Oncology | Admitting: Hematology and Oncology

## 2017-11-18 ENCOUNTER — Other Ambulatory Visit: Payer: Self-pay | Admitting: Hematology and Oncology

## 2017-11-18 DIAGNOSIS — C9000 Multiple myeloma not having achieved remission: Secondary | ICD-10-CM

## 2017-11-18 NOTE — Progress Notes (Signed)
*  Preliminary Results* Left upper extremity venous duplex completed. Left upper extremity is negative for deep and superficial vein thrombosis.  11/18/2017 11:39 AM  Maudry Mayhew, BS, RVT, RDCS, RDMS

## 2017-11-19 ENCOUNTER — Other Ambulatory Visit: Payer: Self-pay

## 2017-11-19 DIAGNOSIS — C9000 Multiple myeloma not having achieved remission: Secondary | ICD-10-CM

## 2017-11-20 ENCOUNTER — Inpatient Hospital Stay: Payer: PPO

## 2017-11-20 ENCOUNTER — Other Ambulatory Visit: Payer: Self-pay | Admitting: Hematology and Oncology

## 2017-11-20 VITALS — BP 149/81 | HR 85 | Temp 98.7°F | Resp 17

## 2017-11-20 DIAGNOSIS — C9000 Multiple myeloma not having achieved remission: Secondary | ICD-10-CM

## 2017-11-20 DIAGNOSIS — Z5111 Encounter for antineoplastic chemotherapy: Secondary | ICD-10-CM | POA: Diagnosis not present

## 2017-11-20 LAB — CBC WITH DIFFERENTIAL (CANCER CENTER ONLY)
BASOS PCT: 0 %
Basophils Absolute: 0 10*3/uL (ref 0.0–0.1)
EOS ABS: 0 10*3/uL (ref 0.0–0.5)
EOS PCT: 0 %
HCT: 32.7 % — ABNORMAL LOW (ref 34.8–46.6)
Hemoglobin: 10.4 g/dL — ABNORMAL LOW (ref 11.6–15.9)
Lymphocytes Relative: 16 %
Lymphs Abs: 0.7 10*3/uL — ABNORMAL LOW (ref 0.9–3.3)
MCH: 28.8 pg (ref 25.1–34.0)
MCHC: 31.8 g/dL (ref 31.5–36.0)
MCV: 90.6 fL (ref 79.5–101.0)
MONO ABS: 0.1 10*3/uL (ref 0.1–0.9)
Monocytes Relative: 3 %
Neutro Abs: 3.5 10*3/uL (ref 1.5–6.5)
Neutrophils Relative %: 81 %
PLATELETS: 129 10*3/uL — AB (ref 145–400)
RBC: 3.61 MIL/uL — ABNORMAL LOW (ref 3.70–5.45)
RDW: 17.1 % — AB (ref 11.2–14.5)
WBC Count: 4.3 10*3/uL (ref 3.9–10.3)

## 2017-11-20 LAB — COMPREHENSIVE METABOLIC PANEL
ALBUMIN: 3.5 g/dL (ref 3.5–5.0)
ALT: 37 U/L (ref 14–54)
AST: 52 U/L — AB (ref 15–41)
Alkaline Phosphatase: 61 U/L (ref 38–126)
Anion gap: 11 (ref 5–15)
BUN: 12 mg/dL (ref 6–20)
CHLORIDE: 101 mmol/L (ref 101–111)
CO2: 22 mmol/L (ref 22–32)
Calcium: 8.2 mg/dL — ABNORMAL LOW (ref 8.9–10.3)
Creatinine, Ser: 0.72 mg/dL (ref 0.44–1.00)
GFR calc Af Amer: >60 mL/min (ref >60)
GFR calc non Af Amer: >60 mL/min (ref >60)
GLUCOSE: 145 mg/dL — AB (ref 65–99)
Potassium: 4.3 mmol/L (ref 3.5–5.1)
SODIUM: 134 mmol/L — AB (ref 135–145)
Total Bilirubin: 0.4 mg/dL (ref 0.3–1.2)
Total Protein: 6.8 g/dL (ref 6.5–8.1)

## 2017-11-20 MED ORDER — BORTEZOMIB CHEMO SQ INJECTION 3.5 MG (2.5MG/ML)
1.3000 mg/m2 | Freq: Once | INTRAMUSCULAR | Status: AC
Start: 2017-11-20 — End: 2017-11-20
  Administered 2017-11-20: 2.5 mg via SUBCUTANEOUS
  Filled 2017-11-20: qty 2.5

## 2017-11-20 NOTE — Assessment & Plan Note (Signed)
67 y.o. female with new diagnosis of multiple myeloma, R-ISS stage II, no high risk cytogenetics.  Imaging with PET/CT demonstrates no gross radiographic disease evidence.  Bone marrow biopsy showed 57% involvement with plasma cell process.  Patient has significant dysproteinemia with kappa light chain to lambda light chain ratio exceeding 100.  Unfortunately, serum protein electrophoresis and quantitative immunoglobulins have not been processed correctly and are not available at this time.  We have discussed these results with the patient previously and strongly recommended initiation of systemic therapy for what appears to be active multiple myeloma based on percentage of bone marrow involvement and kappa to lambda ratio.  Cytopenias are also likely attributable to the underlying plasma cell disorder.  Our plan is to proceed with induction therapy including lenalidomide, bortezomib, low-dose dexamethasone.  Based on patient's overall health and age, she is considered to be a candidate for high intensity consolidation therapy.  Our plan is to proceed with 3 cycles of induction followed by restaging bone marrow biopsy and possible 1-3 additional cycles of treatment after which patient will be referred for high-dose chemotherapy with autologous stem cell rescue consolidation.  Patient was started on systemic therapy for the underlying malignancy diagnosis.  Tolerated first week of treatment without significant complications.  Plan: -Proceed with cycle 1, day 8 of lenalidomide, bortezomib, low-dose dexamethasone.  Administer zoledronic acid today.  Will treat with zoledronic acid every 6 weeks for convenience. -Disease assessment will be conducted with myeloma panel obtained with each cycle of therapy as well as a restaging bone marrow biopsy obtained after completion of 3 cycles of systemic therapy. -Return to clinic in 2 weeks for toxicity assessment and possible initiation of the next cycle of RVD  chemotherapy.Marland Kitchen

## 2017-11-20 NOTE — Progress Notes (Signed)
Pt took compazine at home today. D/C compazine in clinic for today.  Hardie Pulley, PharmD, BCPS, BCOP

## 2017-11-20 NOTE — Progress Notes (Signed)
[name] Follow-up Visit:  Assessment: Multiple myeloma not having achieved remission [facility]) 67 y.o. female with new diagnosis of multiple myeloma, R-ISS stage II, no high risk cytogenetics.  Imaging with PET/CT demonstrates no gross radiographic disease evidence.  Bone marrow biopsy showed 57% involvement with plasma cell process.  Patient has significant dysproteinemia with kappa light chain to lambda light chain ratio exceeding 100.  Unfortunately, serum protein electrophoresis and quantitative immunoglobulins have not been processed correctly and are not available at this time.  We have discussed these results with the patient previously and strongly recommended initiation of systemic therapy for what appears to be active multiple myeloma based on percentage of bone marrow involvement and kappa to lambda ratio.  Cytopenias are also likely attributable to the underlying plasma cell disorder.  Our plan is to proceed with induction therapy including lenalidomide, bortezomib, low-dose dexamethasone.  Based on patient's overall health and age, she is considered to be a candidate for high intensity consolidation therapy.  Our plan is to proceed with 3 cycles of induction followed by restaging bone marrow biopsy and possible 1-3 additional cycles of treatment after which patient will be referred for high-dose chemotherapy with autologous stem cell rescue consolidation.  Patient was started on systemic therapy for the underlying malignancy diagnosis.  Tolerated first week of treatment without significant complications.  Plan: -Proceed with cycle 1, day 8 of lenalidomide, bortezomib, low-dose dexamethasone.  Administer zoledronic acid today.  Will treat with zoledronic acid every 6 weeks for convenience. -Disease assessment will be conducted with myeloma panel obtained with each cycle of therapy as well as a restaging bone marrow biopsy obtained after completion of 3 cycles of systemic  therapy. -Return to clinic in 2 weeks for toxicity assessment and possible initiation of the next cycle of RVD chemotherapy..   Voice recognition software was used and creation of this note. Despite my best effort at editing the text, some misspelling/errors may have occurred.  Orders Placed This Encounter  Procedures  . CBC with Differential (Cancer Center Only)    Standing Status:   Future    Number of Occurrences:   1    Standing Expiration Date:   10/31/2018  . CMP ([place] only)    Standing Status:   Future    Number of Occurrences:   1    Standing Expiration Date:   10/31/2018  . Magnesium    Standing Status:   Future    Number of Occurrences:   1    Standing Expiration Date:   10/30/2018    Cancer Staging Multiple myeloma not having achieved remission ([place]) Staging form: Plasma Cell Myeloma and Plasma Cell Disorders, AJCC 8th Edition - Clinical stage from 10/23/2017: RISS Stage II (Beta-2-microglobulin (mg/L): 2.8, Albumin (g/dL): 3.3, ISS: Stage II, High-risk cytogenetics: Absent, LDH: Normal) - Signed by Ardath Sax, MD on 11/12/2017   All questions were answered.  . The patient knows to call the clinic with any problems, questions or concerns.  This note was electronically signed.    History of Presenting Illness [name] is a 67 y.o. female followed in the [facility] for neutropenia and iron deficiency anemia, referred by Dr Deland Pretty.  Patient's past medical history is significant for history of 2 episodes of breast cancer on the right side initially treated with lumpectomy followed by mastectomy and chemoradiotherapy administered by Dr. Jana Hakim due to cancer recurrence, history of hepatitis C, depression, hypertension, fibromyalgia, insomnia.  Patient's previous surgeries  include cholecystectomy, total abdominal hysterectomy with bilateral salpingo-oophorectomy and right total hip replacement in 2017.  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  Currently taking no over-the-counter medications or herbal supplements, also is not taking oral iron supplement due to significant constipation.  Family history significant for sister with breast cancer and factor V Leiden mutation.  Patient returns to the clinic for toxicity monitoring 1 week after initiating induction systemic therapy with lenalidomide, bortezomib, and low-dose dexamethasone for diagnosis of multiple myeloma.  The interim, patient denies any new symptoms compared to last visit to the clinic.  At the present time, patient denies any fevers, chills, night sweats.  Denies any weakness or sensory deficits in the hands, or extremities.  Denies shortness of breath, chest pain or cough.  No nausea, vomiting, abdominal pain, diarrhea, constipation.  No dysuria or hematuria.  Patient denies hematochezia, melena, epistaxis, hemoptysis, or hematemesis..  Oncological/hematological History: --Labs, 03/10/14: WBC 7.8,                                                 Hgb 11.7, MCV 82.0, MCH 27.0, MCHC 32.9, RDW 14.9, Plt 240;                                                          Vit D-25OH 39.5 --Labs, 08/25/14: WBC 8.1,                                                 Hgb 10.3, MCV 80.5, MCH 24.5, MCHC 30.4, RDW 15.8, Plt 255;                                                          Vit D-25OH 48.4 --Labs, 04/10/16: WBC 6.7, ANC 3.1, ALC 3.0, Mono 0.5, Hgb   8.9, MCV 94.0, MCH 28.3, MCHC 30.2, RDW 14.0, Plt 266; Fe 46, FeSat 13%, TIBC 360, Transferrin 329 --Labs, 04/14/17: WBC 3.4,                                                 Hgb 10.9, MCV 89.7, MCH 28.7, MCHC 32.0, RDW 16.0, Plt 190; Fe 58, FeSat 13%, TIBC 448,        Vit D-25OH 29.0 --Labs, 05/13/17: FOBT -- negative x3 --Labs, 08/28/17: WBC 3.8, ANC 1.8, ALC 1.8, Mono 0.2, Hgb   9.7, MCV 91.8, MCH 29.3, MCHC 31.9, RDW 15.8, Plt 205; Fe 66, FeSat 14%, TIBC 476, Ferritin 19 --Treatment:    --Ferrous gluconate 359m PO Qday-TID, 09/04/17-: Metamucil for constipation     Multiple myeloma not having achieved remission ([place]   09/22/2017 Initial Diagnosis    Multiple myeloma  not having achieved remission ([place])      09/22/2017 Bone Marrow Biopsy    Pathology: Hypercellular bone marrow with marked elevation of kappa light chain restricted plasma cells accounting for 57% of cellularity. CytoGen: Normal cytogenetics FISH: Positive for-4,-14,13q-      10/08/2017 Tumor Marker    tProt 6.9, Alb 3.3, Ca 9.1, Cr 0.7, AP 90 LDH 151, beta-2 microglobulin 2.8; SPEP ...; IgG ..., IgA ..., IgM ...; kappa 1231, lambda 6.3, KLR 195       10/16/2017 PET scan    No focal hypermetabolic lesions identified. Large solid-appearing nodule in left lobe of thyroid gland noted. 61m left upper lobe lung nodule noted.      10/23/2017 -  Chemotherapy    Bortezomib SQ (VELCADE) 1.3 mg/m2 SQ d1,4,8,15 + Lenalidomide 249mPO QDay d1-15, + Dexamethasone 4033mO QWeek Q21d --Cycle #1, 10/23/17:       10/23/2017 Cancer Staging    Staging form: Plasma Cell Myeloma and Plasma Cell Disorders, AJCC 8th Edition - Clinical stage from 10/23/2017: RISS Stage II (Beta-2-microglobulin (mg/L): 2.8, Albumin (g/dL): 3.3, ISS: Stage II, High-risk cytogenetics: Absent, LDH: Normal) - Signed by PerArdath SaxD on 11/12/2017       Medical History: Past Medical History:  Diagnosis Date  . Anemia    2016 on iron for a while   . Anxiety   . ARTHRITIS 10/25/2007  . Arthritis   . Blood transfusion without reported diagnosis   . BREAST CANCER, HX OF 08/03/2006  . Cancer ([facility]  hx of breast ca x 2 on right   . DEPRESSION 08/03/2006  . Diabetes ([facility]  prediabetic   . DIABETES MELLITUS, TYPE II 08/03/2006  . DYSPNEA ON EXERTION 11/21/2009   shortness of breath with exertion   . EPISTAXIS, RECURRENT 01/27/2008  . FIBROMYALGIA 01/13/2008  . GERD 04/30/2008  . HEPATITIS C 08/03/2006  . HIP PAIN, RIGHT 04/30/2007  .  History of detached retina repair 02/12/2011  . HYPERTENSION 04/30/2008  . INSOMNIA-SLEEP DISORDER-UNSPEC 11/21/2009  . KNEE SPRAIN, ACUTE 01/27/2008  . LEG PAIN, LEFT 01/13/2008  . OSTEOPENIA 07/26/2007  . Persistent vomiting 08/17/2008  . Pneumonia    hx of   . Prediabetes     Surgical History: Past Surgical History:  Procedure Laterality Date  . ABDOMINAL HYSTERECTOMY    . BREAST LUMPECTOMY  2000   right with axillary LND  . CHOLECYSTECTOMY  06/05/2012   Procedure: LAPAROSCOPIC CHOLECYSTECTOMY;  Surgeon: SteAdin HectorD;  Location: [facility];  Service: General;  Laterality: N/A;  . LAPAROSCOPIC LYSIS OF ADHESIONS  06/05/2012   Procedure: LAPAROSCOPIC LYSIS OF ADHESIONS;  Surgeon: SteAdin HectorD;  Location: [facility];  Service: General;  Laterality: N/A;  . MASTECTOMY  2007   Dr. StrMargot Chimes s/p right broken leg with surgury as teen    . s/p thyroid FNA neg.    . TONSILLECTOMY    . TOTAL HIP ARTHROPLASTY Right 01/25/2016   Procedure: RIGHT TOTAL HIP ARTHROPLASTY ANTERIOR APPROACH;  Surgeon: ChrMcarthur RossettiD;  Location: [facility];  Service: Orthopedics;  Laterality: Right;  . TUBAL LIGATION      Family History: Family History  Problem Relation Age of Onset  . Heart disease Mother   . Cancer Sister        breast  . Depression Father   . Depression Brother     Social History: Social History   Socioeconomic History  . Marital status:  Divorced    Spouse name: Not on file  . Number of children: Not on file  . Years of education: Not on file  . Highest education level: Not on file  Occupational History  . Occupation: Sports coach  Social Needs  . Financial resource strain: Not on file  . Food insecurity:    Worry: Not on file    Inability: Not on file  . Transportation needs:    Medical: Not on file    Non-medical: Not on file  Tobacco Use  . Smoking status: Former Smoker    Packs/day: 4.00    Years: 40.00    Pack years: 160.00    Types: Cigarettes     Last attempt to quit: 06/30/2005    Years since quitting: 12.4  . Smokeless tobacco: Never Used  . Tobacco comment: Pt smoked up to 4 ppd  Substance and Sexual Activity  . Alcohol use: Yes    Alcohol/week: 0.0 oz    Comment: rare  . Drug use: No  . Sexual activity: Not Currently  Lifestyle  . Physical activity:    Days per week: Not on file    Minutes per session: Not on file  . Stress: Not on file  Relationships  . Social connections:    Talks on phone: Not on file    Gets together: Not on file    Attends religious service: Not on file    Active member of club or organization: Not on file    Attends meetings of clubs or organizations: Not on file    Relationship status: Not on file  . Intimate partner violence:    Fear of current or ex partner: Not on file    Emotionally abused: Not on file    Physically abused: Not on file    Forced sexual activity: Not on file  Other Topics Concern  . Not on file  Social History Narrative  . Not on file    Allergies: Allergies  Allergen Reactions  . Ace Inhibitors Cough  . Contrast Media [Iodinated Diagnostic Agents]     Hallucinations  . Iohexol      Desc: Pt has been pre medicated with prednisone before scans, pt is unsure of type of contrast she reacted to.   . Tape Rash    "Tears my skin all up"    Medications:  Current Outpatient Medications  Medication Sig Dispense Refill  . acyclovir (ZOVIRAX) 400 MG tablet Take 1 tablet (400 mg total) by mouth 2 (two) times daily. 60 tablet 3  . amitriptyline (ELAVIL) 25 MG tablet Take 1 tablet (25 mg total) by mouth at bedtime. 90 tablet 1  . ARIPiprazole (ABILIFY) 2 MG tablet Take 1 tablet (2 mg total) by mouth daily. Start in the morning 90 tablet 1  . aspirin EC 81 MG tablet Take 162 mg by mouth daily.    . cholestyramine (QUESTRAN) 4 GM/DOSE powder Take 1 packet (4 g total) by mouth 2 (two) times daily with a meal. 378 g 11  . dexamethasone (DECADRON) 4 MG tablet Take 2 and a half  tablets (10 mg) on the day before and the day of Velcade chemotherapy. Repeat every 21 days. 20 tablet 3  . diphenhydrAMINE (BENADRYL) 25 MG tablet Take 25 mg by mouth every 6 (six) hours as needed.    . DULoxetine (CYMBALTA) 30 MG capsule Take 1 capsule (30 mg total) by mouth daily. Take with 60 mg capsule for a total of 90 mg  daily 90 capsule 3  . DULoxetine (CYMBALTA) 60 MG capsule Take 1 capsule (60 mg total) by mouth daily. Take with 30 mg capsule for a total of 90 mg daily 90 capsule 3  . gabapentin (NEURONTIN) 300 MG capsule Take 2 capsules (600 mg total) by mouth 4 (four) times daily. 720 capsule 3  . lenalidomide (REVLIMID) 25 MG capsule TAKE 1 CAPSULE (25MG) BY MOUTH ONCE DAILY FOR 14 DAYS ON 7 DAYS OFF REPEAT EVERY 21 DAYS 14 capsule 0  . lidocaine-prilocaine (EMLA) cream Apply to affected area once 30 g 3  . LORazepam (ATIVAN) 0.5 MG tablet Take 1 tablet (0.5 mg total) by mouth every 6 (six) hours as needed (Nausea or vomiting). 30 tablet 0  . Multiple Vitamins-Minerals (MULTIVITAMIN WITH MINERALS) tablet Take 1 tablet by mouth daily.    Meghan Kitchen omeprazole (PRILOSEC) 20 MG capsule Take 2 capsules (40 mg total) by mouth daily. (Patient taking differently: Take 20 mg by mouth 2 (two) times daily. ) 180 capsule 3  . ondansetron (ZOFRAN) 8 MG tablet Take 1 tablet (8 mg total) by mouth 2 (two) times daily as needed (Nausea or vomiting). 30 tablet 1  . oxyCODONE-acetaminophen (PERCOCET) 10-325 MG tablet Take 1 tablet by mouth 5 (five) times daily.    . prochlorperazine (COMPAZINE) 10 MG tablet Take 1 tablet (10 mg total) by mouth every 6 (six) hours as needed (Nausea or vomiting). 30 tablet 1  . temazepam (RESTORIL) 30 MG capsule Take 1 capsule (30 mg total) by mouth Nightly. 90 capsule 1   No current facility-administered medications for this visit.    Facility-Administered Medications Ordered in Other Visits  Medication Dose Route Frequency Provider Last Rate Last Dose  . bortezomib SQ (VELCADE)  chemo injection 2.5 mg  1.3 mg/m2 (Treatment Plan Recorded) Subcutaneous Once Quenna Doepke, Marinell Blight, MD        Review of Systems: Review of Systems  All other systems reviewed and are negative.    PHYSICAL EXAMINATION Blood pressure (!) 143/77, pulse (!) 108, temperature 98.3 F (36.8 C), temperature source Oral, resp. rate 17, height 5' 0.5" (1.537 m), weight 176 lb 14.4 oz (80.2 kg), SpO2 100 %.  ECOG PERFORMANCE STATUS: 2 - Symptomatic, <50% confined to bed  Physical Exam  Constitutional: She is oriented to person, place, and time and well-developed, well-nourished, and in no distress. No distress.  HENT:  Head: Normocephalic and atraumatic.  Mouth/Throat: Oropharynx is clear and moist. No oropharyngeal exudate.  Eyes: Pupils are equal, round, and reactive to light. Conjunctivae and EOM are normal. No scleral icterus.  Neck: No thyromegaly present.  Cardiovascular: Normal rate, regular rhythm and normal heart sounds.  No murmur heard. Pulmonary/Chest: Effort normal and breath sounds normal. No respiratory distress. She has no wheezes. She has no rales.  Abdominal: Soft. Bowel sounds are normal. She exhibits no distension and no mass. There is no tenderness. There is no guarding.  Musculoskeletal: She exhibits no edema.  Lymphadenopathy:    She has no cervical adenopathy.  Neurological: She is alert and oriented to person, place, and time. She has normal reflexes. No cranial nerve deficit.  Skin: Skin is warm and dry. No rash noted. She is not diaphoretic. No erythema. No pallor.     LABORATORY DATA: I have personally reviewed the data as listed: Appointment on 10/30/2017  Component Date Value Ref Range Status  . Magnesium 10/30/2017 2.3  1.7 - 2.4 mg/dL Final   Performed at [facility], [address] [place], [place] [address]  . Sodium 10/30/2017 137  136 - 145 mmol/L Final  . Potassium 10/30/2017 4.2  3.5 - 5.1 mmol/L Final  . Chloride 10/30/2017  106  98 - 109 mmol/L Final  . CO2 10/30/2017 21* 22 - 29 mmol/L Final  . Glucose, Bld 10/30/2017 203* 70 - 140 mg/dL Final  . BUN 10/30/2017 13  7 - 26 mg/dL Final  . Creatinine 10/30/2017 0.81  0.60 - 1.10 mg/dL Final  . Calcium 10/30/2017 8.4  8.4 - 10.4 mg/dL Final  . Total Protein 10/30/2017 7.3  6.4 - 8.3 g/dL Final  . Albumin 10/30/2017 3.8  3.5 - 5.0 g/dL Final  . AST 10/30/2017 47* 5 - 34 U/L Final  . ALT 10/30/2017 43  0 - 55 U/L Final  . Alkaline Phosphatase 10/30/2017 77  40 - 150 U/L Final  . Total Bilirubin 10/30/2017 0.3  0.2 - 1.2 mg/dL Final  . GFR, Est Non Af Am 10/30/2017 >60  >60 mL/min Final  . GFR, Est AFR Am 10/30/2017 >60  >60 mL/min Final   Comment: (NOTE) The eGFR has been calculated using the CKD EPI equation. This calculation has not been validated in all clinical situations. eGFR's persistently <60 mL/min signify possible Chronic Kidney Disease.   Georgiann Hahn gap 10/30/2017 10  3 - 11 Final   Performed at [facility], [place] [address] [place], [place] [address]  . WBC Count 10/30/2017 5.6  3.9 - 10.3 K/uL Final  . RBC 10/30/2017 3.66* 3.70 - 5.45 MIL/uL Final  . Hemoglobin 10/30/2017 10.7* 11.6 - 15.9 g/dL Final  . HCT 10/30/2017 33.9* 34.8 - 46.6 % Final  . MCV 10/30/2017 92.6  79.5 - 101.0 fL Final  . MCH 10/30/2017 29.2  25.1 - 34.0 pg Final  . MCHC 10/30/2017 31.6  31.5 - 36.0 g/dL Final  . RDW 10/30/2017 16.4* 11.2 - 14.5 % Final  . Platelet Count 10/30/2017 167  145 - 400 K/uL Final  . Neutrophils Relative % 10/30/2017 83  % Final  . Neutro Abs 10/30/2017 4.6  1.5 - 6.5 K/uL Final  . Lymphocytes Relative 10/30/2017 16  % Final  . Lymphs Abs 10/30/2017 0.9  0.9 - 3.3 K/uL Final  . Monocytes Relative 10/30/2017 1  % Final  . Monocytes Absolute 10/30/2017 0.1  0.1 - 0.9 K/uL Final  . Eosinophils Relative 10/30/2017 0  % Final  . Eosinophils Absolute 10/30/2017 0.0  0.0 - 0.5 K/uL Final  . Basophils Relative 10/30/2017 0  %  Final  . Basophils Absolute 10/30/2017 0.0  0.0 - 0.1 K/uL Final   Performed at [facility], [place] [address], [place], [place] [address]       Ardath Sax, MD

## 2017-11-20 NOTE — Progress Notes (Signed)
Pt should be scheduled for cycle 2 day 11 Velcade on 5/28 or 5/29. Message sent to scheduling as requested by Dr. Lebron Conners. RN informed. Pt aware.  Hardie Pulley, PharmD, BCPS, BCOP

## 2017-11-20 NOTE — Patient Instructions (Signed)
[facility] Discharge Instructions for Patients Receiving Chemotherapy  Today you received the following chemotherapy agents Velcade To help prevent nausea and vomiting after your treatment, we encourage you to take your nausea medication as prescribed.   If you develop nausea and vomiting that is not controlled by your nausea medication, call the clinic.   BELOW ARE SYMPTOMS THAT SHOULD BE REPORTED IMMEDIATELY:  *FEVER GREATER THAN 100.5 F  *CHILLS WITH OR WITHOUT FEVER  NAUSEA AND VOMITING THAT IS NOT CONTROLLED WITH YOUR NAUSEA MEDICATION  *UNUSUAL SHORTNESS OF BREATH  *UNUSUAL BRUISING OR BLEEDING  TENDERNESS IN MOUTH AND THROAT WITH OR WITHOUT PRESENCE OF ULCERS  *URINARY PROBLEMS  *BOWEL PROBLEMS  UNUSUAL RASH Items with * indicate a potential emergency and should be followed up as soon as possible.  Feel free to call the clinic should you have any questions or concerns. The clinic phone number is [phone].  Please show the CHEMO ALERT CARD at check-in to the Emergency Department and triage nurse.   

## 2017-11-20 NOTE — Progress Notes (Signed)
Patient refused compazine, she has taken it prior to infusion appt. Pharmacy aware per Elmyra Ricks, RN.  Cyndia Bent RN

## 2017-11-24 ENCOUNTER — Emergency Department (HOSPITAL_COMMUNITY): Payer: PPO

## 2017-11-24 ENCOUNTER — Other Ambulatory Visit: Payer: Self-pay

## 2017-11-24 ENCOUNTER — Telehealth: Payer: Self-pay | Admitting: Emergency Medicine

## 2017-11-24 ENCOUNTER — Emergency Department (HOSPITAL_COMMUNITY)
Admission: EM | Admit: 2017-11-24 | Discharge: 2017-11-24 | Disposition: A | Discharge status: Home / Self Care | Payer: PPO | Attending: Emergency Medicine | Admitting: Emergency Medicine

## 2017-11-24 ENCOUNTER — Encounter (HOSPITAL_COMMUNITY): Payer: Self-pay | Admitting: Family Medicine

## 2017-11-24 DIAGNOSIS — I10 Essential (primary) hypertension: Secondary | ICD-10-CM | POA: Insufficient documentation

## 2017-11-24 DIAGNOSIS — Z96641 Presence of right artificial hip joint: Secondary | ICD-10-CM | POA: Insufficient documentation

## 2017-11-24 DIAGNOSIS — E119 Type 2 diabetes mellitus without complications: Secondary | ICD-10-CM | POA: Insufficient documentation

## 2017-11-24 DIAGNOSIS — Z79899 Other long term (current) drug therapy: Secondary | ICD-10-CM | POA: Insufficient documentation

## 2017-11-24 DIAGNOSIS — R0602 Shortness of breath: Secondary | ICD-10-CM | POA: Insufficient documentation

## 2017-11-24 DIAGNOSIS — Z7982 Long term (current) use of aspirin: Secondary | ICD-10-CM | POA: Diagnosis not present

## 2017-11-24 DIAGNOSIS — Z87891 Personal history of nicotine dependence: Secondary | ICD-10-CM | POA: Insufficient documentation

## 2017-11-24 DIAGNOSIS — R0789 Other chest pain: Secondary | ICD-10-CM | POA: Diagnosis not present

## 2017-11-24 HISTORY — DX: Nontoxic single thyroid nodule: E04.1

## 2017-11-24 LAB — CBC WITH DIFFERENTIAL/PLATELET
BASOS ABS: 0 10*3/uL (ref 0.0–0.1)
Basophils Relative: 0 %
EOS PCT: 1 %
Eosinophils Absolute: 0 10*3/uL (ref 0.0–0.7)
HCT: 31.5 % — ABNORMAL LOW (ref 36.0–46.0)
HEMOGLOBIN: 10.1 g/dL — AB (ref 12.0–15.0)
LYMPHS PCT: 27 %
Lymphs Abs: 1.1 10*3/uL (ref 0.7–4.0)
MCH: 28.9 pg (ref 26.0–34.0)
MCHC: 32.1 g/dL (ref 30.0–36.0)
MCV: 90 fL (ref 78.0–100.0)
Monocytes Absolute: 0.4 10*3/uL (ref 0.1–1.0)
Monocytes Relative: 10 %
NEUTROS PCT: 62 %
Neutro Abs: 2.4 10*3/uL (ref 1.7–7.7)
Platelets: 54 10*3/uL — ABNORMAL LOW (ref 150–400)
RBC: 3.5 MIL/uL — ABNORMAL LOW (ref 3.87–5.11)
RDW: 16.3 % — AB (ref 11.5–15.5)
WBC: 3.9 10*3/uL — ABNORMAL LOW (ref 4.0–10.5)
nRBC: 2 /100 WBC — ABNORMAL HIGH

## 2017-11-24 LAB — BASIC METABOLIC PANEL
ANION GAP: 10 (ref 5–15)
BUN: 6 mg/dL (ref 6–20)
CALCIUM: 7.6 mg/dL — AB (ref 8.9–10.3)
CHLORIDE: 101 mmol/L (ref 101–111)
CO2: 23 mmol/L (ref 22–32)
Creatinine, Ser: 0.58 mg/dL (ref 0.44–1.00)
GFR calc non Af Amer: >60 mL/min (ref >60)
Glucose, Bld: 103 mg/dL — ABNORMAL HIGH (ref 65–99)
Potassium: 4 mmol/L (ref 3.5–5.1)
Sodium: 134 mmol/L — ABNORMAL LOW (ref 135–145)

## 2017-11-24 LAB — I-STAT TROPONIN, ED
Troponin i, poc: 0.02 ng/mL (ref 0.00–0.08)
Troponin i, poc: 0.01 ng/mL (ref 0.00–0.08)

## 2017-11-24 MED ORDER — HYDROCORTISONE NA SUCCINATE PF 250 MG IJ SOLR
200.0000 mg | Freq: Once | INTRAMUSCULAR | Status: AC
  Administered 2017-11-24: 200 mg via INTRAVENOUS
  Filled 2017-11-24: qty 200

## 2017-11-24 MED ORDER — AEROCHAMBER PLUS FLO-VU MEDIUM MISC
1.0000 | Freq: Once | Status: AC
  Administered 2017-11-24: 1
  Filled 2017-11-24: qty 1

## 2017-11-24 MED ORDER — DIPHENHYDRAMINE HCL 25 MG PO CAPS
50.0000 mg | ORAL_CAPSULE | Freq: Once | ORAL | Status: AC
  Administered 2017-11-24: 50 mg via ORAL
  Filled 2017-11-24: qty 2

## 2017-11-24 MED ORDER — DIPHENHYDRAMINE HCL 50 MG/ML IJ SOLN
50.0000 mg | Freq: Once | INTRAMUSCULAR | Status: AC
  Filled 2017-11-24: qty 1

## 2017-11-24 MED ORDER — ALBUTEROL SULFATE HFA 108 (90 BASE) MCG/ACT IN AERS
1.0000 | INHALATION_SPRAY | Freq: Once | RESPIRATORY_TRACT | Status: AC
  Administered 2017-11-24: 1 via RESPIRATORY_TRACT
  Filled 2017-11-24: qty 6.7

## 2017-11-24 MED ORDER — IOPAMIDOL (ISOVUE-370) INJECTION 76%
100.0000 mL | Freq: Once | INTRAVENOUS | Status: AC | PRN
  Administered 2017-11-24: 100 mL via INTRAVENOUS

## 2017-11-24 MED ORDER — IOPAMIDOL (ISOVUE-370) INJECTION 76%
INTRAVENOUS | Status: AC
  Filled 2017-11-24: qty 100

## 2017-11-24 NOTE — Telephone Encounter (Signed)
Returning call from pt's sister from this morning.  Pt reports severe SOB and that her sister will be taking her to the ER at this time instead of trying to see her doctor or [facility].  Verbalized understanding that she can call us any time with questions/concerns.

## 2017-11-24 NOTE — ED Notes (Signed)
IV team at bedside 

## 2017-11-24 NOTE — ED Triage Notes (Signed)
Patient reports she is experiencing shortness of breath on Sunday night. Patient reports she has multiple myeloma and taking chemotherapy. [place] reports she needs to be further evaluated for possible blood clot due to some of the medications she is on. Patient denies chest pain, cough, or fever.

## 2017-11-24 NOTE — ED Provider Notes (Signed)
[facility] DEPT Provider Note   CSN: [id number] Arrival date & time: 11/24/17  1300     History   Chief Complaint Chief Complaint  Patient presents with  . Shortness of Breath    HPI Meghan ODONELL is a 67 y.o. female with a past medical history of breast cancer currently in remission, multiple myeloma diagnosed 6 weeks ago currently on chemotherapy, diabetes, hypertension, who presents to ED for evaluation of shortness of breath.  She began having sudden shortness of breath 2 days ago.  She walked about 10 feet in her house when she began feeling "winded."  She states that she usually does not walk around much in her house and usually just sits on her couch.  She also has some chest tightness.  She reports intermittent dry cough secondary to throat irritation.  Denies any hemoptysis, fever, prior DVT, PE or MI, history of asthma or COPD, abdominal pain, vomiting, diarrhea, lightheadedness, changes in activity, recent surgeries or leg swelling.  Patient denies any drug use.  Reports distant history of tobacco use for 40 years.  She quit 9 years ago but states that "I have had a cigarette or 2 since the diagnosis [multiple myeloma]." Of note, patient states her reaction to contrast dye occurred 40 years ago when she experienced "hallucinations."  She denies any history of rash, angioedema or anaphylaxis as a result of contrast dye administration.  HPI  Past Medical History:  Diagnosis Date  . Anemia    2016 on iron for a while   . Anxiety   . ARTHRITIS 10/25/2007  . Arthritis   . Blood transfusion without reported diagnosis   . BREAST CANCER, HX OF 08/03/2006  . Cancer ([place])    hx of breast ca x 2 on right   . DEPRESSION 08/03/2006  . Diabetes ([place])    prediabetic   . DIABETES MELLITUS, TYPE II 08/03/2006  . DYSPNEA ON EXERTION 11/21/2009   shortness of breath with exertion   . EPISTAXIS, RECURRENT 01/27/2008  . FIBROMYALGIA 01/13/2008  . GERD 04/30/2008  .  HEPATITIS C 08/03/2006  . HIP PAIN, RIGHT 04/30/2007  . History of detached retina repair 02/12/2011  . HYPERTENSION 04/30/2008  . INSOMNIA-SLEEP DISORDER-UNSPEC 11/21/2009  . KNEE SPRAIN, ACUTE 01/27/2008  . LEG PAIN, LEFT 01/13/2008  . OSTEOPENIA 07/26/2007  . Persistent vomiting 08/17/2008  . Pneumonia    hx of   . Prediabetes   . Thyroid nodule     Patient Active Problem List   Diagnosis Date Noted  . Multiple myeloma not having achieved remission ([place]) 10/08/2017  . Leukopenia 09/08/2017  . Anemia 09/08/2017  . Major depressive disorder, recurrent episode, moderate ([place]) 05/07/2016  . Elevated CK   . Moderate protein-calorie malnutrition ([place])   . Aspiration pneumonia ([place])   . Major depressive disorder, recurrent episode, severe ([place]) 04/05/2016  . Intentional drug overdose ([place]) 04/04/2016  . Suicidal ideation 04/04/2016  . Non-traumatic rhabdomyolysis 04/04/2016  . Osteoarthritis of right hip 01/25/2016  . 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 Cough 05/17/2014  . Lower back pain 04/19/2013  . Obesity (BMI 30-39.9) 06/14/2012  . Acute cholecystitis with chronic cholecystitis 06/04/2012  . 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 Chronic diarrhea 06/04/2012  . Poor venous access 06/04/2012  . Hemangioma of liver - left hepatic lobe 06/04/2012  . Right hip pain 09/12/2011  . History of detached retina repair 02/12/2011  . Preventative health care 02/08/2011  .  Psychophysiological insomnia 11/21/2009  . DOE (dyspnea on exertion) 11/21/2009  . Essential hypertension 04/30/2008  . GERD 04/30/2008  . EPISTAXIS, RECURRENT 01/27/2008  . KNEE SPRAIN, ACUTE 01/27/2008  . FIBROMYALGIA 01/13/2008  . LEG PAIN, LEFT 01/13/2008  . Arthritis of right hip 10/25/2007  . OSTEOPENIA 07/26/2007  . HIP PAIN, RIGHT 04/30/2007  . HEPATITIS C 08/03/2006  . Diabetes ([place]) 08/03/2006  . Major depressive disorder, single episode 08/03/2006  . BREAST CANCER, HX  OF 08/03/2006    Past Surgical History:  Procedure Laterality Date  . ABDOMINAL HYSTERECTOMY    . BREAST LUMPECTOMY  2000   right with axillary LND  . CHOLECYSTECTOMY  06/05/2012   Procedure: LAPAROSCOPIC CHOLECYSTECTOMY;  Surgeon: Adin Hector, MD;  Location: [facility] ORS;  Service: General;  Laterality: N/A;  . LAPAROSCOPIC LYSIS OF ADHESIONS  06/05/2012   Procedure: LAPAROSCOPIC LYSIS OF ADHESIONS;  Surgeon: Adin Hector, MD;  Location: [facility];  Service: General;  Laterality: N/A;  . MASTECTOMY  2007   Dr. Margot Chimes  . s/p right broken leg with surgury as teen    . s/p thyroid FNA neg.    . TONSILLECTOMY    . TOTAL HIP ARTHROPLASTY Right 01/25/2016   Procedure: RIGHT TOTAL HIP ARTHROPLASTY ANTERIOR APPROACH;  Surgeon: Mcarthur Rossetti, MD;  Location: [facility] ORS;  Service: Orthopedics;  Laterality: Right;  . TUBAL LIGATION       OB History   None      Home Medications    Prior to Admission medications   Medication Sig Start Date End Date Taking? Authorizing Provider  acyclovir (ZOVIRAX) 400 MG tablet Take 1 tablet (400 mg total) by mouth 2 (two) times daily. 11/17/17  Yes Ardath Sax, MD  amitriptyline (ELAVIL) 25 MG tablet Take 1 tablet (25 mg total) by mouth at bedtime. 07/27/17  Yes Eksir, Richard Miu, MD  ARIPiprazole (ABILIFY) 2 MG tablet Take 1 tablet (2 mg total) by mouth daily. Start in the morning 09/21/17  Yes Eksir, Richard Miu, MD  aspirin EC 81 MG tablet Take 162 mg by mouth daily.   Yes [provider]  CANNABIDIOL PO Take 1 Dose by mouth at bedtime as needed (sleep).   Yes [provider]  cholestyramine (QUESTRAN) 4 GM/DOSE powder Take 1 packet (4 g total) by mouth 2 (two) times daily with a meal. Patient taking differently: Take 4 g by mouth 2 (two) times daily as needed (diarrhea).  04/13/14  Yes Biagio Borg, MD  dexamethasone (DECADRON) 4 MG tablet Take 2 and a half tablets (10 mg) on the day before and the day of Velcade  chemotherapy. Repeat every 21 days. 10/08/17  Yes Ardath Sax, MD  diphenhydrAMINE (BENADRYL) 25 MG tablet Take 25 mg by mouth every 6 (six) hours as needed.   Yes [provider]  DULoxetine (CYMBALTA) 30 MG capsule Take 1 capsule (30 mg total) by mouth daily. Take with 60 mg capsule for a total of 90 mg daily 07/27/17 07/27/18 Yes Eksir, Richard Miu, MD  DULoxetine (CYMBALTA) 60 MG capsule Take 1 capsule (60 mg total) by mouth daily. Take with 30 mg capsule for a total of 90 mg daily 07/27/17 07/27/18 Yes Eksir, Richard Miu, MD  gabapentin (NEURONTIN) 300 MG capsule Take 2 capsules (600 mg total) by mouth 4 (four) times daily. 07/27/17 07/27/18 Yes Eksir, Richard Miu, MD  lenalidomide (REVLIMID) 25 MG capsule TAKE 1 CAPSULE (25MG) BY MOUTH ONCE DAILY FOR 14 DAYS ON 7  DAYS OFF REPEAT EVERY 21 DAYS 11/09/17  Yes Perlov, Marinell Blight, MD  LORazepam (ATIVAN) 0.5 MG tablet Take 1 tablet (0.5 mg total) by mouth every 6 (six) hours as needed (Nausea or vomiting). 10/08/17  Yes Ardath Sax, MD  Melatonin 5 MG TABS Take 5 mg by mouth at bedtime.   Yes [provider]  Multiple Vitamins-Minerals (MULTIVITAMIN WITH MINERALS) tablet Take 1 tablet by mouth daily.   Yes [provider]  omeprazole (PRILOSEC) 20 MG capsule Take 2 capsules (40 mg total) by mouth daily. Patient taking differently: Take 20 mg by mouth daily.  04/13/14  Yes Biagio Borg, MD  ondansetron (ZOFRAN) 8 MG tablet Take 1 tablet (8 mg total) by mouth 2 (two) times daily as needed (Nausea or vomiting). 10/08/17  Yes Ardath Sax, MD  oxyCODONE-acetaminophen (PERCOCET) 10-325 MG tablet Take 1 tablet by mouth 5 (five) times daily as needed for pain.    Yes [provider]  prochlorperazine (COMPAZINE) 10 MG tablet Take 1 tablet (10 mg total) by mouth every 6 (six) hours as needed (Nausea or vomiting). 10/08/17  Yes Perlov, Marinell Blight, MD  temazepam (RESTORIL) 30 MG capsule Take 1 capsule (30 mg  total) by mouth Nightly. 07/27/17  Yes Eksir, Richard Miu, MD  lidocaine-prilocaine (EMLA) cream Apply to affected area once Patient not taking: Reported on 11/24/2017 10/08/17   Ardath Sax, MD    Family History Family History  Problem Relation Age of Onset  . Heart disease Mother   . Cancer Sister        breast  . Depression Father   . Depression Brother     Social History Social History   Tobacco Use  . Smoking status: Former Smoker    Packs/day: 4.00    Years: 40.00    Pack years: 160.00    Types: Cigarettes    Last attempt to quit: 06/30/2005    Years since quitting: 12.4  . Smokeless tobacco: Never Used  . Tobacco comment: Pt smoked up to 4 ppd  Substance Use Topics  . Alcohol use: Yes    Alcohol/week: 0.0 oz    Comment: Once a year  . Drug use: No     Allergies   Ace inhibitors; Contrast media [iodinated diagnostic agents]; Iohexol; and Tape   Review of Systems Review of Systems  Constitutional: Negative for appetite change, chills and fever.  HENT: Negative for ear pain, rhinorrhea, sneezing and sore throat.   Eyes: Negative for photophobia and visual disturbance.  Respiratory: Positive for cough and shortness of breath. Negative for chest tightness and wheezing.   Cardiovascular: Positive for chest pain. Negative for palpitations.  Gastrointestinal: Negative for abdominal pain, blood in stool, constipation, diarrhea, nausea and vomiting.  Genitourinary: Negative for dysuria, hematuria and urgency.  Musculoskeletal: Negative for myalgias.  Skin: Negative for rash.  Neurological: Negative for dizziness, weakness and light-headedness.     Physical Exam Updated Vital Signs BP 128/75 (BP Location: Left Arm)   Pulse 77   Temp 98.5 F (36.9 C) (Oral)   Resp 18   Ht 5' 0.5" (1.537 m)   Wt 83.5 kg (184 lb)   SpO2 95%   BMI 35.34 kg/m   Physical Exam  Constitutional: She appears well-developed and well-nourished. No distress.    Nontoxic-appearing and in no acute distress.  Speaking in complete sentences without difficulty.  HENT:  Head: Normocephalic and atraumatic.  Nose: Nose normal.  Eyes: Conjunctivae and EOM are normal.  Right eye exhibits no discharge. Left eye exhibits no discharge. No scleral icterus.  Neck: Normal range of motion. Neck supple.  Cardiovascular: Normal rate, regular rhythm, normal heart sounds and intact distal pulses. Exam reveals no gallop and no friction rub.  No murmur heard. Pulmonary/Chest: Effort normal and breath sounds normal. No respiratory distress.  Abdominal: Soft. Bowel sounds are normal. She exhibits no distension. There is no tenderness. There is no guarding.  Musculoskeletal: Normal range of motion. She exhibits no edema.       Right lower leg: She exhibits no tenderness and no edema.       Left lower leg: She exhibits no tenderness and no edema.  No lower extremity edema, erythema or calf tenderness bilaterally.  Neurological: She is alert. She exhibits normal muscle tone. Coordination normal.  Skin: Skin is warm and dry. No rash noted.  Psychiatric: She has a normal mood and affect.  Nursing note and vitals reviewed.    ED Treatments / Results  Labs (all labs ordered are listed, but only abnormal results are displayed) Labs Reviewed  CBC WITH DIFFERENTIAL/PLATELET - Abnormal; Notable for the following components:      Result Value   WBC 3.9 (*)    RBC 3.50 (*)    Hemoglobin 10.1 (*)    HCT 31.5 (*)    RDW 16.3 (*)    Platelets 54 (*)    nRBC 2 (*)    All other components within normal limits  BASIC METABOLIC PANEL - Abnormal; Notable for the following components:   Sodium 134 (*)    Glucose, Bld 103 (*)    Calcium 7.6 (*)    All other components within normal limits  I-STAT TROPONIN, ED  I-STAT TROPONIN, ED    EKG EKG Interpretation  Date/Time:  Tuesday Nov 24 2017 13:17:30 EDT Ventricular Rate:  82 PR Interval:    QRS Duration: 77 QT  Interval:  411 QTC Calculation: 480 R Axis:   64 Text Interpretation:  Normal sinus rhythm Consider left ventricular hypertrophy Confirmed by Sherwood Gambler [id number]) on 11/24/2017 8:55:08 PM   Radiology Dg Chest 2 View  Result Date: 11/24/2017 CLINICAL DATA:  67 year old female with shortness of breath. Undergoing chemotherapy for multiple myeloma. EXAM: CHEST - 2 VIEW COMPARISON:  PET-CT 10/16/2017, chest radiographs 04/04/2016. FINDINGS: Moderate size gastric hiatal hernia plus mild tortuosity of the thoracic aorta. Stable mediastinal contours. Visualized tracheal air column is within normal limits. No pneumothorax, pulmonary edema, pleural effusion or acute pulmonary opacity. Mild chronic asymmetric opacity at the overlap of the right anterior 6th and posterior 8th ribs. No destructive osseous lesion is evident. Paucity of bowel gas in the upper abdomen. IMPRESSION: No acute cardiopulmonary abnormality.  Chronic hiatal hernia. Electronically Signed   By: Genevie Ann M.D.   On: 11/24/2017 14:49   Ct Angio Chest Pe W/cm &/or Wo Cm  Result Date: 11/24/2017 CLINICAL DATA:  Shortness of breath for 3 days. On chemotherapy for multiple myeloma. History of breast cancer. EXAM: CT ANGIOGRAPHY CHEST WITH CONTRAST TECHNIQUE: Multidetector CT imaging of the chest was performed using the standard protocol during bolus administration of intravenous contrast. Multiplanar CT image reconstructions and MIPs were obtained to evaluate the vascular anatomy. CONTRAST:  113m ISOVUE-370 IOPAMIDOL (ISOVUE-370) INJECTION 76% COMPARISON:  PET CT October 16, 2017 and chest radiograph Nov 24, 2017 FINDINGS: CARDIOVASCULAR: Adequate contrast opacification of the pulmonary artery's. Main pulmonary artery is not enlarged. No pulmonary arterial filling defects to the level of the subsegmental  branches. Heart size is mildly enlarged. Mild coronary artery calcification. No pericardial effusion. Thoracic aorta is normal course and caliber,  mild calcific atherosclerosis. MEDIASTINUM/NODES: No lymphadenopathy by CT size criteria. LUNGS/PLEURA: Tracheobronchial tree is patent, no pneumothorax. No pleural effusions, focal consolidations, or masses. Stable 3 mm lingular pulmonary nodule (series 6, image 62). UPPER ABDOMEN: Nonacute. Large hiatal hernia. Status post cholecystectomy. MUSCULOSKELETAL: Nonacute. RIGHT possibly LEFT mastectomies. Healing RIGHT sixth and seventh lateral rib fractures. Broad dextroscoliosis. Review of the MIP images confirms the above findings. IMPRESSION: 1. No pulmonary embolism. 2. Mild cardiomegaly. No acute pulmonary process. Stable 3 mm LEFT upper lobe pulmonary nodule. 3. Large hiatal hernia. 4. **An incidental finding of potential clinical significance has been found. 3.7 cm LEFT thyroid nodule. This follows ACR consensus guidelines: Managing Incidental Thyroid Nodules Detected on Imaging: White Paper of the ACR Incidental Thyroid Findings Committee. J Am Coll Radiol 2015; 12:143-150.** Aortic Atherosclerosis (ICD10-I70.0). Electronically Signed   By: Elon Alas M.D.   On: 11/24/2017 20:19    Procedures Procedures (including critical care time)  Medications Ordered in ED Medications  albuterol (PROVENTIL HFA;VENTOLIN HFA) 108 (90 Base) MCG/ACT inhaler 1 puff (has no administration in time range)  AEROCHAMBER PLUS FLO-VU MEDIUM MISC 1 each (has no administration in time range)  hydrocortisone sodium succinate (SOLU-CORTEF) injection 200 mg (200 mg Intravenous Given 11/24/17 1547)  diphenhydrAMINE (BENADRYL) capsule 50 mg (50 mg Oral Given 11/24/17 1848)    Or  diphenhydrAMINE (BENADRYL) injection 50 mg ( Intravenous See Alternative 11/24/17 1848)  iopamidol (ISOVUE-370) 76 % injection 100 mL (100 mLs Intravenous Contrast Given 11/24/17 1936)     Initial Impression / Assessment and Plan / ED Course  I have reviewed the triage vital signs and the nursing notes.  Pertinent labs & imaging results that  were available during my care of the patient were reviewed by me and considered in my medical decision making (see chart for details).  Clinical Course as of Nov 24 2109  Tue Nov 24, 2017  1313 SpO2: 97 % [HK]  1313 Resp: 18 [HK]  1313 Pulse Rate: 83 [HK]  1313 Temp: 98.5 F (36.9 C) [HK]    Clinical Course User Index [HK] Delia Heady, PA-C    Patient presents to ED for evaluation of shortness of breath.  She has a history of breast cancer currently in remission, multiple myeloma currently on chemotherapy which was diagnosed 6 weeks ago, hypertension.  She reports shortness of breath for the past 2 days worse with ambulation.  Some chest tightness noted.  Reports intermittent dry cough secondary to throat irritation.  Denies any hemoptysis, fever, prior DVT, PE or MI, history of asthma or CBD, abdominal pain.  Physical exam patient is overall well-appearing.  She does not appear to be in any respiratory distress.  She is not hypoxic, tachypneic or tachycardic.  She is afebrile.  No chest pain noted on my examination.  Lungs are clear to auscultation bilaterally.  CBC, BMP unremarkable.  She does have platelets at 29 which is lower than her value days ago.  She states that "my labs are all out of whack because my multiple myeloma."  Initial and delta troponin were both negative.  EKG is nonischemic.  Chest x-ray unremarkable. CT angio PE study is negative for PE.  Negative for any acute process.  She is aware of the pulmonary nodule and the thyroid nodule that were found.  Patient request inhaler to use as needed.  She declines breathing treatment.  Advised her to follow-up with her primary care provider and her oncologist for further evaluation.  Patient is comfortable for discharge home does not feel that she needs to be admitted.  Advised to return to ED for any severe worsening symptoms.  Portions of this note were generated with [employer]. Dictation errors may occur despite best  attempts at proofreading.   Final Clinical Impressions(s) / ED Diagnoses   Final diagnoses:  SOB (shortness of breath)    ED Discharge Orders    None       Delia Heady, PA-C 11/24/17 2111    Fatima Blank, MD 11/28/17 1054

## 2017-11-24 NOTE — ED Notes (Signed)
Patient transported to CT 

## 2017-11-25 ENCOUNTER — Other Ambulatory Visit: Payer: Self-pay | Admitting: Hematology and Oncology

## 2017-11-25 ENCOUNTER — Telehealth: Payer: Self-pay

## 2017-11-25 ENCOUNTER — Inpatient Hospital Stay: Payer: PPO

## 2017-11-25 VITALS — BP 129/53 | HR 82 | Temp 99.0°F | Resp 20

## 2017-11-25 DIAGNOSIS — C9000 Multiple myeloma not having achieved remission: Secondary | ICD-10-CM

## 2017-11-25 DIAGNOSIS — Z5111 Encounter for antineoplastic chemotherapy: Secondary | ICD-10-CM | POA: Diagnosis not present

## 2017-11-25 MED ORDER — PROCHLORPERAZINE MALEATE 10 MG PO TABS
ORAL_TABLET | ORAL | Status: AC
  Filled 2017-11-25: qty 1

## 2017-11-25 MED ORDER — PROCHLORPERAZINE MALEATE 10 MG PO TABS
10.0000 mg | ORAL_TABLET | Freq: Once | ORAL | Status: AC
  Administered 2017-11-25: 10 mg via ORAL

## 2017-11-25 MED ORDER — BORTEZOMIB CHEMO SQ INJECTION 3.5 MG (2.5MG/ML)
1.3000 mg/m2 | Freq: Once | INTRAMUSCULAR | Status: AC
  Administered 2017-11-25: 2.5 mg via SUBCUTANEOUS
  Filled 2017-11-25: qty 2.5

## 2017-11-25 NOTE — Patient Instructions (Signed)
[facility] Discharge Instructions for Patients Receiving Chemotherapy  Today you received the following chemotherapy agents Velcade To help prevent nausea and vomiting after your treatment, we encourage you to take your nausea medication as prescribed.   If you develop nausea and vomiting that is not controlled by your nausea medication, call the clinic.   BELOW ARE SYMPTOMS THAT SHOULD BE REPORTED IMMEDIATELY:  *FEVER GREATER THAN 100.5 F  *CHILLS WITH OR WITHOUT FEVER  NAUSEA AND VOMITING THAT IS NOT CONTROLLED WITH YOUR NAUSEA MEDICATION  *UNUSUAL SHORTNESS OF BREATH  *UNUSUAL BRUISING OR BLEEDING  TENDERNESS IN MOUTH AND THROAT WITH OR WITHOUT PRESENCE OF ULCERS  *URINARY PROBLEMS  *BOWEL PROBLEMS  UNUSUAL RASH Items with * indicate a potential emergency and should be followed up as soon as possible.  Feel free to call the clinic should you have any questions or concerns. The clinic phone number is [phone].  Please show the CHEMO ALERT CARD at check-in to the Emergency Department and triage nurse.   

## 2017-11-25 NOTE — Telephone Encounter (Signed)
Per Dr. Lebron Conners, it is ok to treat patient with Velcade today with a platelet count of 54.

## 2017-11-26 ENCOUNTER — Ambulatory Visit (HOSPITAL_COMMUNITY)
Admission: RE | Admit: 2017-11-26 | Discharge: 2017-11-26 | Disposition: A | Discharge status: Home / Self Care | Payer: PPO | Source: Ambulatory Visit | Attending: Hematology and Oncology | Admitting: Hematology and Oncology

## 2017-11-26 DIAGNOSIS — R0600 Dyspnea, unspecified: Secondary | ICD-10-CM | POA: Insufficient documentation

## 2017-11-26 DIAGNOSIS — Z6835 Body mass index (BMI) 35.0-35.9, adult: Secondary | ICD-10-CM | POA: Insufficient documentation

## 2017-11-26 DIAGNOSIS — I119 Hypertensive heart disease without heart failure: Secondary | ICD-10-CM | POA: Diagnosis not present

## 2017-11-26 DIAGNOSIS — E669 Obesity, unspecified: Secondary | ICD-10-CM | POA: Diagnosis not present

## 2017-11-26 DIAGNOSIS — E119 Type 2 diabetes mellitus without complications: Secondary | ICD-10-CM | POA: Insufficient documentation

## 2017-11-26 DIAGNOSIS — C9000 Multiple myeloma not having achieved remission: Secondary | ICD-10-CM | POA: Diagnosis not present

## 2017-11-26 DIAGNOSIS — I082 Rheumatic disorders of both aortic and tricuspid valves: Secondary | ICD-10-CM | POA: Insufficient documentation

## 2017-11-26 NOTE — Progress Notes (Signed)
  Echocardiogram 2D Echocardiogram has been performed.  [name] 11/26/2017, 11:43 AM

## 2017-11-27 ENCOUNTER — Ambulatory Visit: Payer: Self-pay

## 2017-11-27 ENCOUNTER — Other Ambulatory Visit: Payer: Self-pay | Admitting: Hematology and Oncology

## 2017-11-27 DIAGNOSIS — C9000 Multiple myeloma not having achieved remission: Secondary | ICD-10-CM

## 2017-11-29 ENCOUNTER — Encounter: Payer: Self-pay | Admitting: Hematology and Oncology

## 2017-11-29 NOTE — Progress Notes (Signed)
[name] Follow-up Visit:  Assessment: Multiple myeloma not having achieved remission [facility]) 67 y.o. female with new diagnosis of multiple myeloma, R-ISS stage II, no high risk cytogenetics.  Imaging with PET/CT demonstrates no gross radiographic disease evidence.  Bone marrow biopsy showed 57% involvement with plasma cell process.  Patient has significant dysproteinemia with kappa light chain to lambda light chain ratio exceeding 100.  Unfortunately, serum protein electrophoresis and quantitative immunoglobulins have not been processed correctly and are not available at this time.  We have discussed these results with the patient previously and strongly recommended initiation of systemic therapy for what appears to be active multiple myeloma based on percentage of bone marrow involvement and kappa to lambda ratio.  Cytopenias are also likely attributable to the underlying plasma cell disorder.  Our plan is to proceed with induction therapy including lenalidomide, bortezomib, low-dose dexamethasone.  Based on patient's overall health and age, she is considered to be a candidate for high intensity consolidation therapy.  Our plan is to proceed with 3 cycles of induction followed by restaging bone marrow biopsy and possible 1-3 additional cycles of treatment after which patient will be referred for high-dose chemotherapy with autologous stem cell rescue consolidation.  Patient was started on systemic therapy for the underlying malignancy diagnosis.  Patient appears to have tolerated the first cycle of therapy without significant complications.  The only concern today is bilateral lower extremity swelling with left greater than right.  Otherwise, the clinical evaluation lab work are permissive to proceed with the next cycle of systemic therapy.  Plan: -Due to patient being on lenalidomide which poses a risk for deep vein thrombosis, will obtain bilateral lower extremity Dopplers due to  development of swelling -Proceed with cycle 2, day 1 of lenalidomide, bortezomib, low-dose dexamethasone.  Will treat with zoledronic acid every 6 weeks for convenience -next dose on day 1 of the third cycle of therapy. -Disease assessment will be conducted with myeloma panel obtained with each cycle of therapy as well as a restaging bone marrow biopsy obtained after completion of 3 cycles of systemic therapy. -Return to clinic in 3weeks for toxicity assessment and possible initiation of the next cycle of RVD chemotherapy..   Voice recognition software was used and creation of this note. Despite my best effort at editing the text, some misspelling/errors may have occurred.  No orders of the defined types were placed in this encounter.   Cancer Staging Multiple myeloma not having achieved remission ([place]) Staging form: Plasma Cell Myeloma and Plasma Cell Disorders, AJCC 8th Edition - Clinical stage from 10/23/2017: RISS Stage II (Beta-2-microglobulin (mg/L): 2.8, Albumin (g/dL): 3.3, ISS: Stage II, High-risk cytogenetics: Absent, LDH: Normal) - Signed by Ardath Sax, MD on 11/12/2017   All questions were answered.  . The patient knows to call the clinic with any problems, questions or concerns.  This note was electronically signed.    History of Presenting Illness [name] is a 67 y.o. female followed in the [place] for neutropenia and iron deficiency anemia, referred by Dr Deland Pretty.  Patient's past medical history is significant for history of 2 episodes of breast cancer on the right side initially treated with lumpectomy followed by mastectomy and chemoradiotherapy administered by Dr. Jana Hakim due to cancer recurrence, history of hepatitis C, depression, hypertension, fibromyalgia, insomnia.  Patient's previous surgeries include cholecystectomy, total abdominal hysterectomy with bilateral salpingo-oophorectomy and right total hip replacement in 2017.  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  Currently taking no over-the-counter medications or herbal supplements, also is not taking oral iron supplement due to significant constipation.  Family history significant for sister with breast cancer and factor V Leiden mutation.  Patient returns to the clinic to consider continuation of induction systemic therapy with lenalidomide, bortezomib, and low-dose dexamethasone for diagnosis of multiple myeloma.  The interim, patient denies any new symptoms compared to last visit to the clinic with exception of interval swelling in the left lower extremity.  At the present time, patient denies any fevers, chills, night sweats.  Denies any weakness or sensory deficits in the hands, or extremities.  Denies shortness of breath, chest pain or cough.  No nausea, vomiting, abdominal pain, diarrhea, constipation.  No dysuria or hematuria.  Patient denies hematochezia, melena, epistaxis, hemoptysis, or hematemesis..  Oncological/hematological History: --Labs, 03/10/14: WBC 7.8,                                                 Hgb 11.7, MCV 82.0, MCH 27.0, MCHC 32.9, RDW 14.9, Plt 240;                                                          Vit D-25OH 39.5 --Labs, 08/25/14: WBC 8.1,                                                 Hgb 10.3, MCV 80.5, MCH 24.5, MCHC 30.4, RDW 15.8, Plt 255;                                                          Vit D-25OH 48.4 --Labs, 04/10/16: WBC 6.7, ANC 3.1, ALC 3.0, Mono 0.5, Hgb   8.9, MCV 94.0, MCH 28.3, MCHC 30.2, RDW 14.0, Plt 266; Fe 46, FeSat 13%, TIBC 360, Transferrin 329 --Labs, 04/14/17: WBC 3.4,                                                 Hgb 10.9, MCV 89.7, MCH 28.7, MCHC 32.0, RDW 16.0, Plt 190; Fe 58, FeSat 13%, TIBC 448,        Vit D-25OH 29.0 --Labs, 05/13/17: FOBT -- negative x3 --Labs, 08/28/17: WBC 3.8, ANC 1.8, ALC 1.8, Mono 0.2, Hgb   9.7, MCV 91.8, MCH 29.3, MCHC 31.9, RDW  15.8, Plt 205; Fe 66, FeSat 14%, TIBC 476, Ferritin 19 --Treatment:   --Ferrous gluconate 339m PO Qday-TID, 09/04/17-: Metamucil for constipation     [place] achieved [place]   09/22/2017 Initial Diagnosis    [place] achieved remission ([place]      09/22/2017 Bone Marrow Biopsy  Pathology: Hypercellular bone marrow with marked elevation of kappa light chain restricted plasma cells accounting for 57% of cellularity. CytoGen: Normal cytogenetics FISH: Positive for-4,-14,13q-      10/08/2017 Tumor Marker    tProt 6.9, Alb 3.3, Ca 9.1, Cr 0.7, AP 90 LDH 151, beta-2 microglobulin 2.8; SPEP ...; IgG ..., IgA ..., IgM ...; kappa 1231, lambda 6.3, KLR 195       10/16/2017 PET scan    No focal hypermetabolic lesions identified. Large solid-appearing nodule in left lobe of thyroid gland noted. 69m left upper lobe lung nodule noted.      10/23/2017 -  Chemotherapy    Bortezomib SQ (VELCADE) 1.3 mg/m2 SQ d1,4,8,15 + Lenalidomide 236mPO QDay d1-15, + Dexamethasone 4051mO QWeek Q21d --Cycle #1, 10/23/17:       10/23/2017 Cancer Staging    Staging form: Plasma Cell Myeloma and Plasma Cell Disorders, AJCC 8th Edition - Clinical stage from 10/23/2017: RISS Stage II (Beta-2-microglobulin (mg/L): 2.8, Albumin (g/dL): 3.3, ISS: Stage II, High-risk cytogenetics: Absent, LDH: Normal) - Signed by PerArdath SaxD on 11/12/2017      11/24/2017 Imaging    1. No pulmonary embolism. 2. Mild cardiomegaly. No acute pulmonary process. Stable 3 mm LEFT upper lobe pulmonary nodule. 3. Large hiatal hernia. 4. **An incidental finding of potential clinical significance has been found. 3.7 cm LEFT thyroid nodule      11/26/2017 Imaging    LV EF: 55% -   60%       Medical History: Past Medical History:  Diagnosis Date  . Anemia    2016 on iron for a while   . Anxiety   . ARTHRITIS 10/25/2007  . Arthritis   . Blood transfusion without reported  diagnosis   . BREAST CANCER, HX OF 08/03/2006  . [place]  hx of breast ca x 2 on right   . DEPRESSION 08/03/2006  . Diabetes ([place]  prediabetic   . DIABETES MELLITUS, TYPE II 08/03/2006  . DYSPNEA ON EXERTION 11/21/2009   shortness of breath with exertion   . EPISTAXIS, RECURRENT 01/27/2008  . FIBROMYALGIA 01/13/2008  . GERD 04/30/2008  . HEPATITIS C 08/03/2006  . HIP PAIN, RIGHT 04/30/2007  . History of detached retina repair 02/12/2011  . HYPERTENSION 04/30/2008  . INSOMNIA-SLEEP DISORDER-UNSPEC 11/21/2009  . KNEE SPRAIN, ACUTE 01/27/2008  . LEG PAIN, LEFT 01/13/2008  . OSTEOPENIA 07/26/2007  . Persistent vomiting 08/17/2008  . Pneumonia    hx of   . Prediabetes   . Thyroid nodule     Surgical History: Past Surgical History:  Procedure Laterality Date  . ABDOMINAL HYSTERECTOMY    . BREAST LUMPECTOMY  2000   right with axillary LND  . CHOLECYSTECTOMY  06/05/2012   Procedure: LAPAROSCOPIC CHOLECYSTECTOMY;  Surgeon: SteAdin HectorD;  Location: [facility];  Service: General;  Laterality: N/A;  . LAPAROSCOPIC LYSIS OF ADHESIONS  06/05/2012   Procedure: LAPAROSCOPIC LYSIS OF ADHESIONS;  Surgeon: SteAdin HectorD;  Location: [facility];  Service: General;  Laterality: N/A;  . MASTECTOMY  2007   Dr. StrMargot Chimes s/p right broken leg with surgury as teen    . s/p thyroid FNA neg.    . TONSILLECTOMY    . TOTAL HIP ARTHROPLASTY Right 01/25/2016   Procedure: RIGHT TOTAL HIP ARTHROPLASTY ANTERIOR APPROACH;  Surgeon: ChrMcarthur RossettiD;  Location: [facility];  Service: Orthopedics;  Laterality: Right;  . TUBAL LIGATION  Family History: Family History  Problem Relation Age of Onset  . Heart disease Mother   . Cancer Sister        breast  . Depression Father   . Depression Brother     Social History: Social History   Socioeconomic History  . Marital status: Divorced    Spouse name: Not on file  . Number of children: Not on file  . Years of education: Not on file  . Highest  education level: Not on file  Occupational History  . Occupation: Sports coach  Social Needs  . Financial resource strain: Not on file  . Food insecurity:    Worry: Not on file    Inability: Not on file  . Transportation needs:    Medical: Not on file    Non-medical: Not on file  Tobacco Use  . Smoking status: Former Smoker    Packs/day: 4.00    Years: 40.00    Pack years: 160.00    Types: Cigarettes    Last attempt to quit: 06/30/2005    Years since quitting: 12.4  . Smokeless tobacco: Never Used  . Tobacco comment: Pt smoked up to 4 ppd  Substance and Sexual Activity  . Alcohol use: Yes    Alcohol/week: 0.0 oz    Comment: Once a year  . Drug use: No  . Sexual activity: Not Currently  Lifestyle  . Physical activity:    Days per week: Not on file    Minutes per session: Not on file  . Stress: Not on file  Relationships  . Social connections:    Talks on phone: Not on file    Gets together: Not on file    Attends religious service: Not on file    Active member of club or organization: Not on file    Attends meetings of clubs or organizations: Not on file    Relationship status: Not on file  . Intimate partner violence:    Fear of current or ex partner: Not on file    Emotionally abused: Not on file    Physically abused: Not on file    Forced sexual activity: Not on file  Other Topics Concern  . Not on file  Social History Narrative  . Not on file    Allergies: Allergies  Allergen Reactions  . Ace Inhibitors Cough  . Contrast Media [Iodinated Diagnostic Agents]     Hallucinations  . Iohexol      Desc: Pt has been pre medicated with prednisone before scans, pt is unsure of type of contrast she reacted to.   . Tape Rash    "Tears my skin all up"    Medications:  Current Outpatient Medications  Medication Sig Dispense Refill  . acyclovir (ZOVIRAX) 400 MG tablet Take 1 tablet (400 mg total) by mouth 2 (two) times daily. 60 tablet 3  . amitriptyline  (ELAVIL) 25 MG tablet Take 1 tablet (25 mg total) by mouth at bedtime. 90 tablet 1  . ARIPiprazole (ABILIFY) 2 MG tablet Take 1 tablet (2 mg total) by mouth daily. Start in the morning 90 tablet 1  . aspirin EC 81 MG tablet Take 162 mg by mouth daily.    Marland Kitchen CANNABIDIOL PO Take 1 Dose by mouth at bedtime as needed (sleep).    . cholestyramine (QUESTRAN) 4 GM/DOSE powder Take 1 packet (4 g total) by mouth 2 (two) times daily with a meal. (Patient taking differently: Take 4 g by mouth 2 (two) times  daily as needed (diarrhea). ) 378 g 11  . dexamethasone (DECADRON) 4 MG tablet Take 2 and a half tablets (10 mg) on the day before and the day of Velcade chemotherapy. Repeat every 21 days. 20 tablet 3  . diphenhydrAMINE (BENADRYL) 25 MG tablet Take 25 mg by mouth every 6 (six) hours as needed.    . DULoxetine (CYMBALTA) 30 MG capsule Take 1 capsule (30 mg total) by mouth daily. Take with 60 mg capsule for a total of 90 mg daily 90 capsule 3  . DULoxetine (CYMBALTA) 60 MG capsule Take 1 capsule (60 mg total) by mouth daily. Take with 30 mg capsule for a total of 90 mg daily 90 capsule 3  . gabapentin (NEURONTIN) 300 MG capsule Take 2 capsules (600 mg total) by mouth 4 (four) times daily. 720 capsule 3  . lenalidomide (REVLIMID) 25 MG capsule TAKE 1 CAPSULE (25MG) BY MOUTH ONCE DAILY FOR 14 DAYS ON 7 DAYS OFF REPEAT EVERY 21 DAYS 14 capsule 0  . lidocaine-prilocaine (EMLA) cream Apply to affected area once (Patient not taking: Reported on 11/24/2017) 30 g 3  . LORazepam (ATIVAN) 0.5 MG tablet Take 1 tablet (0.5 mg total) by mouth every 6 (six) hours as needed (Nausea or vomiting). 30 tablet 0  . Melatonin 5 MG TABS Take 5 mg by mouth at bedtime.    . Multiple Vitamins-Minerals (MULTIVITAMIN WITH MINERALS) tablet Take 1 tablet by mouth daily.    Marland Kitchen omeprazole (PRILOSEC) 20 MG capsule Take 2 capsules (40 mg total) by mouth daily. (Patient taking differently: Take 20 mg by mouth daily. ) 180 capsule 3  . ondansetron  (ZOFRAN) 8 MG tablet Take 1 tablet (8 mg total) by mouth 2 (two) times daily as needed (Nausea or vomiting). 30 tablet 1  . oxyCODONE-acetaminophen (PERCOCET) 10-325 MG tablet Take 1 tablet by mouth 5 (five) times daily as needed for pain.     Marland Kitchen prochlorperazine (COMPAZINE) 10 MG tablet Take 1 tablet (10 mg total) by mouth every 6 (six) hours as needed (Nausea or vomiting). 30 tablet 1  . temazepam (RESTORIL) 30 MG capsule Take 1 capsule (30 mg total) by mouth Nightly. 90 capsule 1   No current facility-administered medications for this visit.     Review of Systems: Review of Systems  All other systems reviewed and are negative.    PHYSICAL EXAMINATION Blood pressure 91/76, pulse 81, temperature 98.1 F (36.7 C), temperature source Oral, resp. rate 18, height 5' 0.5" (1.537 m), weight 183 lb 6.4 oz (83.2 kg), SpO2 100 %.  ECOG PERFORMANCE STATUS: 2 - Symptomatic, <50% confined to bed  Physical Exam  Constitutional: She is oriented to person, place, and time and well-developed, well-nourished, and in no distress. No distress.  HENT:  Head: Normocephalic and atraumatic.  Mouth/Throat: Oropharynx is clear and moist. No oropharyngeal exudate.  Eyes: Pupils are equal, round, and reactive to light. Conjunctivae and EOM are normal. No scleral icterus.  Neck: No thyromegaly present.  Cardiovascular: Normal rate, regular rhythm and normal heart sounds.  No murmur heard. Pulmonary/Chest: Effort normal and breath sounds normal. No respiratory distress. She has no wheezes. She has no rales.  Abdominal: Soft. Bowel sounds are normal. She exhibits no distension and no mass. There is no tenderness. There is no guarding.  Musculoskeletal: She exhibits no edema.  Lymphadenopathy:    She has no cervical adenopathy.  Neurological: She is alert and oriented to person, place, and time. She has normal reflexes. No cranial  nerve deficit.  Skin: Skin is warm and dry. No rash noted. She is not diaphoretic.  No erythema. No pallor.     LABORATORY DATA: I have personally reviewed the data as listed: Appointment on 11/13/2017  Component Date Value Ref Range Status  . Magnesium 11/13/2017 1.9  1.7 - 2.4 mg/dL Final   Performed at [facility], [place] [address], [place], [place] [address]  . Sodium 11/13/2017 136  136 - 145 mmol/L Final  . Potassium 11/13/2017 5.0  3.5 - 5.1 mmol/L Final  . Chloride 11/13/2017 104  98 - 109 mmol/L Final  . CO2 11/13/2017 22  22 - 29 mmol/L Final  . Glucose, Bld 11/13/2017 211* 70 - 140 mg/dL Final  . BUN 11/13/2017 12  7 - 26 mg/dL Final  . Creatinine 11/13/2017 0.78  0.60 - 1.10 mg/dL Final  . Calcium 11/13/2017 8.9  8.4 - 10.4 mg/dL Final  . Total Protein 11/13/2017 6.4  6.4 - 8.3 g/dL Final  . Albumin 11/13/2017 3.4* 3.5 - 5.0 g/dL Final  . AST 11/13/2017 25  5 - 34 U/L Final  . ALT 11/13/2017 29  0 - 55 U/L Final  . Alkaline Phosphatase 11/13/2017 75  40 - 150 U/L Final  . Total Bilirubin 11/13/2017 0.3  0.2 - 1.2 mg/dL Final  . GFR, Est Non Af Am 11/13/2017 >60  >60 mL/min Final  . GFR, Est AFR Am 11/13/2017 >60  >60 mL/min Final   Comment: (NOTE) The eGFR has been calculated using the CKD EPI equation. This calculation has not been validated in all clinical situations. eGFR's persistently <60 mL/min signify possible Chronic Kidney Disease.   Georgiann Hahn gap 11/13/2017 10  3 - 11 Final   Performed at [facility], [place] [address], [place], [place] [address]  . WBC Count 11/13/2017 3.6* 3.9 - 10.3 K/uL Final  . RBC 11/13/2017 3.23* 3.70 - 5.45 MIL/uL Final  . Hemoglobin 11/13/2017 9.5* 11.6 - 15.9 g/dL Final  . HCT 11/13/2017 29.6* 34.8 - 46.6 % Final  . MCV 11/13/2017 91.7  79.5 - 101.0 fL Final  . MCH 11/13/2017 29.5  25.1 - 34.0 pg Final  . MCHC 11/13/2017 32.2  31.5 - 36.0 g/dL Final  . RDW 11/13/2017 17.6* 11.2 - 14.5 % Final  . Platelet Count 11/13/2017 163  145 - 400 K/uL Final  . Neutrophils  Relative % 11/13/2017 77  % Final  . Neutro Abs 11/13/2017 2.8  1.5 - 6.5 K/uL Final  . Lymphocytes Relative 11/13/2017 19  % Final  . Lymphs Abs 11/13/2017 0.7* 0.9 - 3.3 K/uL Final  . Monocytes Relative 11/13/2017 3  % Final  . Monocytes Absolute 11/13/2017 0.1  0.1 - 0.9 K/uL Final  . Eosinophils Relative 11/13/2017 0  % Final  . Eosinophils Absolute 11/13/2017 0.0  0.0 - 0.5 K/uL Final  . Basophils Relative 11/13/2017 1  % Final  . Basophils Absolute 11/13/2017 0.0  0.0 - 0.1 K/uL Final   Performed at [facility], [place] [address] [place], [place] [address]       Ardath Sax, MD

## 2017-11-29 NOTE — Assessment & Plan Note (Addendum)
67 y.o. female with new diagnosis of multiple myeloma, R-ISS stage II, no high risk cytogenetics.  Imaging with PET/CT demonstrates no gross radiographic disease evidence.  Bone marrow biopsy showed 57% involvement with plasma cell process.  Patient has significant dysproteinemia with kappa light chain to lambda light chain ratio exceeding 100.  Unfortunately, serum protein electrophoresis and quantitative immunoglobulins have not been processed correctly and are not available at this time.  We have discussed these results with the patient previously and strongly recommended initiation of systemic therapy for what appears to be active multiple myeloma based on percentage of bone marrow involvement and kappa to lambda ratio.  Cytopenias are also likely attributable to the underlying plasma cell disorder.  Our plan is to proceed with induction therapy including lenalidomide, bortezomib, low-dose dexamethasone.  Based on patient's overall health and age, she is considered to be a candidate for high intensity consolidation therapy.  Our plan is to proceed with 3 cycles of induction followed by restaging bone marrow biopsy and possible 1-3 additional cycles of treatment after which patient will be referred for high-dose chemotherapy with autologous stem cell rescue consolidation.  Patient was started on systemic therapy for the underlying malignancy diagnosis.  Patient appears to have tolerated the first cycle of therapy without significant complications.  The only concern today is bilateral lower extremity swelling with left greater than right.  Otherwise, the clinical evaluation lab work are permissive to proceed with the next cycle of systemic therapy.  Plan: -Due to patient being on lenalidomide which poses a risk for deep vein thrombosis, will obtain bilateral lower extremity Dopplers due to development of swelling -Proceed with cycle 2, day 1 of lenalidomide, bortezomib, low-dose dexamethasone.  Will treat  with zoledronic acid every 6 weeks for convenience -next dose on day 1 of the third cycle of therapy. -Disease assessment will be conducted with myeloma panel obtained with each cycle of therapy as well as a restaging bone marrow biopsy obtained after completion of 3 cycles of systemic therapy. -Return to clinic in 3weeks for toxicity assessment and possible initiation of the next cycle of RVD chemotherapy.. 

## 2017-11-30 ENCOUNTER — Ambulatory Visit (HOSPITAL_COMMUNITY): Payer: PPO | Admitting: Psychiatry

## 2017-11-30 ENCOUNTER — Other Ambulatory Visit: Payer: Self-pay | Admitting: Hematology and Oncology

## 2017-11-30 ENCOUNTER — Telehealth: Payer: Self-pay | Admitting: *Deleted

## 2017-11-30 DIAGNOSIS — C9000 Multiple myeloma not having achieved remission: Secondary | ICD-10-CM

## 2017-11-30 NOTE — Telephone Encounter (Signed)
"  No signs or symptoms, I'm calling to get a prescription from Dr. Alvy Bimler  so I can start my chemotherapy from Dr. Alvy Bimler."  Call transferred to collaborative.

## 2017-12-02 ENCOUNTER — Encounter (HOSPITAL_COMMUNITY): Payer: Self-pay | Admitting: Psychiatry

## 2017-12-02 ENCOUNTER — Ambulatory Visit (INDEPENDENT_AMBULATORY_CARE_PROVIDER_SITE_OTHER): Payer: PPO | Admitting: Psychiatry

## 2017-12-02 DIAGNOSIS — Z87891 Personal history of nicotine dependence: Secondary | ICD-10-CM

## 2017-12-02 DIAGNOSIS — F331 Major depressive disorder, recurrent, moderate: Secondary | ICD-10-CM

## 2017-12-02 DIAGNOSIS — F329 Major depressive disorder, single episode, unspecified: Secondary | ICD-10-CM | POA: Diagnosis not present

## 2017-12-02 DIAGNOSIS — M4807 Spinal stenosis, lumbosacral region: Secondary | ICD-10-CM | POA: Diagnosis not present

## 2017-12-02 DIAGNOSIS — F332 Major depressive disorder, recurrent severe without psychotic features: Secondary | ICD-10-CM

## 2017-12-02 DIAGNOSIS — F5104 Psychophysiologic insomnia: Secondary | ICD-10-CM | POA: Diagnosis not present

## 2017-12-02 DIAGNOSIS — C9000 Multiple myeloma not having achieved remission: Secondary | ICD-10-CM

## 2017-12-02 DIAGNOSIS — G894 Chronic pain syndrome: Secondary | ICD-10-CM | POA: Diagnosis not present

## 2017-12-02 DIAGNOSIS — Z853 Personal history of malignant neoplasm of breast: Secondary | ICD-10-CM

## 2017-12-02 DIAGNOSIS — Z818 Family history of other mental and behavioral disorders: Secondary | ICD-10-CM | POA: Diagnosis not present

## 2017-12-02 DIAGNOSIS — M47817 Spondylosis without myelopathy or radiculopathy, lumbosacral region: Secondary | ICD-10-CM | POA: Diagnosis not present

## 2017-12-02 MED ORDER — TEMAZEPAM 30 MG PO CAPS
30.0000 mg | ORAL_CAPSULE | Freq: Every evening | ORAL | 1 refills | Status: DC
Start: 2017-12-02 — End: 2018-02-16

## 2017-12-02 MED ORDER — ARIPIPRAZOLE 2 MG PO TABS: 2.0000 mg | ORAL_TABLET | Freq: Every day | ORAL | 1 refills | Status: DC

## 2017-12-02 MED ORDER — AMITRIPTYLINE HCL 25 MG PO TABS: 25.0000 mg | ORAL_TABLET | Freq: Every day | ORAL | 1 refills | Status: DC

## 2017-12-02 NOTE — Progress Notes (Signed)
[facility] MD/PA/NP OP Progress Note  12/02/2017 3:37 PM [name]  MRN:  [id number]  Chief Complaint: I have multiple myeloma HPI: [name] presents for follow-up visit and shares the difficult news that she has multiple myeloma and is undergoing chemotherapy.  She is coping with this fairly well considering this is a repeat bout of cancer for her, after surviving breast cancer.  She reports that her mood has managed to be fairly stable and she is sleeping okay at night using CBD oil in addition to amitriptyline and Restoril.   She denies any suicidality and reports that she feels determined to fight the cancer so that she can live another 10-12 years and enjoy her older years.  She also recognizes that multiple myeloma is quite a serious cancer and she may only have 2-3 years left to live under the best of circumstances.  She has had good support from her family and her sister.  We agreed to follow-up in 8-10 weeks and she understands writer is transitioning out of clinic at the end of August.  Will discuss a transition of care plan at that time.  Visit Diagnosis:    ICD-10-CM   1. Major depressive disorder, recurrent episode, moderate (HCC) F33.1 amitriptyline (ELAVIL) 25 MG tablet    ARIPiprazole (ABILIFY) 2 MG tablet    temazepam (RESTORIL) 30 MG capsule  2. Psychophysiological insomnia F51.04 amitriptyline (ELAVIL) 25 MG tablet    ARIPiprazole (ABILIFY) 2 MG tablet    temazepam (RESTORIL) 30 MG capsule  3. Severe episode of recurrent major depressive disorder, without psychotic features ([place]) F33.2 ARIPiprazole (ABILIFY) 2 MG tablet    Past Psychiatric History: See intake H&P for full details. Reviewed, with no updates at this time.   Past Medical History:  Past Medical History:  Diagnosis Date  . Anemia    2016 on iron for a while   . Anxiety   . ARTHRITIS 10/25/2007  . Arthritis   . Blood transfusion without reported diagnosis   . BREAST CANCER, HX OF 08/03/2006  . Cancer ([place])     hx of breast ca x 2 on right   . DEPRESSION 08/03/2006  . Diabetes ([place])    prediabetic   . DIABETES MELLITUS, TYPE II 08/03/2006  . DYSPNEA ON EXERTION 11/21/2009   shortness of breath with exertion   . EPISTAXIS, RECURRENT 01/27/2008  . FIBROMYALGIA 01/13/2008  . GERD 04/30/2008  . HEPATITIS C 08/03/2006  . HIP PAIN, RIGHT 04/30/2007  . History of detached retina repair 02/12/2011  . HYPERTENSION 04/30/2008  . INSOMNIA-SLEEP DISORDER-UNSPEC 11/21/2009  . KNEE SPRAIN, ACUTE 01/27/2008  . LEG PAIN, LEFT 01/13/2008  . OSTEOPENIA 07/26/2007  . Persistent vomiting 08/17/2008  . Pneumonia    hx of   . Prediabetes   . Thyroid nodule     Past Surgical History:  Procedure Laterality Date  . ABDOMINAL HYSTERECTOMY    . BREAST LUMPECTOMY  2000   right with axillary LND  . CHOLECYSTECTOMY  06/05/2012   Procedure: LAPAROSCOPIC CHOLECYSTECTOMY;  Surgeon: Adin Hector, MD;  Location: [facility];  Service: General;  Laterality: N/A;  . LAPAROSCOPIC LYSIS OF ADHESIONS  06/05/2012   Procedure: LAPAROSCOPIC LYSIS OF ADHESIONS;  Surgeon: Adin Hector, MD;  Location: [facility];  Service: General;  Laterality: N/A;  . MASTECTOMY  2007   Dr. Margot Chimes  . s/p right broken leg with surgury as teen    . s/p thyroid FNA neg.    . TONSILLECTOMY    .  TOTAL HIP ARTHROPLASTY Right 01/25/2016   Procedure: RIGHT TOTAL HIP ARTHROPLASTY ANTERIOR APPROACH;  Surgeon: Mcarthur Rossetti, MD;  Location: [facility];  Service: Orthopedics;  Laterality: Right;  . TUBAL LIGATION      Family Psychiatric History: See intake H&P for full details. Reviewed, with no updates at this time.   Family History:  Family History  Problem Relation Age of Onset  . Heart disease Mother   . Cancer Sister        breast  . Depression Father   . Depression Brother     Social History:  Social History   Socioeconomic History  . Marital status: Divorced    Spouse name: Not on file  . Number of children: Not on file  . Years of education:  Not on file  . Highest education level: Not on file  Occupational History  . Occupation: Sports coach  Social Needs  . Financial resource strain: Not on file  . Food insecurity:    Worry: Not on file    Inability: Not on file  . Transportation needs:    Medical: Not on file    Non-medical: Not on file  Tobacco Use  . Smoking status: Former Smoker    Packs/day: 4.00    Years: 40.00    Pack years: 160.00    Types: Cigarettes    Last attempt to quit: 06/30/2005    Years since quitting: 12.4  . Smokeless tobacco: Never Used  . Tobacco comment: Pt smoked up to 4 ppd  Substance and Sexual Activity  . Alcohol use: Yes    Alcohol/week: 0.0 oz    Comment: Once a year  . Drug use: No  . Sexual activity: Not Currently  Lifestyle  . Physical activity:    Days per week: Not on file    Minutes per session: Not on file  . Stress: Not on file  Relationships  . Social connections:    Talks on phone: Not on file    Gets together: Not on file    Attends religious service: Not on file    Active member of club or organization: Not on file    Attends meetings of clubs or organizations: Not on file    Relationship status: Not on file  Other Topics Concern  . Not on file  Social History Narrative  . Not on file    Allergies:  Allergies  Allergen Reactions  . Ace Inhibitors Cough  . Contrast Media [Iodinated Diagnostic Agents]     Hallucinations  . Iohexol      Desc: Pt has been pre medicated with prednisone before scans, pt is unsure of type of contrast she reacted to.   . Tape Rash    "Tears my skin all up"    Metabolic Disorder Labs: Lab Results  Component Value Date   HGBA1C 5.5 04/06/2016   MPG 111 04/06/2016   MPG 128 (H) 06/04/2012   No results found for: PROLACTIN Lab Results  Component Value Date   CHOL 175 08/10/2013   TRIG 250.0 (H) 08/10/2013   HDL 62.80 08/10/2013   CHOLHDL 3 08/10/2013   VLDL 50.0 (H) 08/10/2013   LDLCALC 58 02/10/2011   LDLCALC 66  11/21/2009   Lab Results  Component Value Date   TSH 3.68 08/10/2013   TSH 3.75 02/10/2011    Therapeutic Level Labs: No results found for: LITHIUM No results found for: VALPROATE No components found for:  CBMZ  Current Medications: Current Outpatient Medications  Medication Sig Dispense Refill  . acyclovir (ZOVIRAX) 400 MG tablet Take 1 tablet (400 mg total) by mouth 2 (two) times daily. 60 tablet 3  . amitriptyline (ELAVIL) 25 MG tablet Take 1 tablet (25 mg total) by mouth at bedtime. 90 tablet 1  . ARIPiprazole (ABILIFY) 2 MG tablet Take 1 tablet (2 mg total) by mouth daily. Start in the morning 90 tablet 1  . aspirin EC 81 MG tablet Take 162 mg by mouth daily.    Marland Kitchen CANNABIDIOL PO Take 1 Dose by mouth at bedtime as needed (sleep).    . cholestyramine (QUESTRAN) 4 GM/DOSE powder Take 1 packet (4 g total) by mouth 2 (two) times daily with a meal. (Patient taking differently: Take 4 g by mouth 2 (two) times daily as needed (diarrhea). ) 378 g 11  . dexamethasone (DECADRON) 4 MG tablet Take 2 and a half tablets (10 mg) on the day before and the day of Velcade chemotherapy. Repeat every 21 days. 20 tablet 3  . diphenhydrAMINE (BENADRYL) 25 MG tablet Take 25 mg by mouth every 6 (six) hours as needed.    . DULoxetine (CYMBALTA) 30 MG capsule Take 1 capsule (30 mg total) by mouth daily. Take with 60 mg capsule for a total of 90 mg daily 90 capsule 3  . DULoxetine (CYMBALTA) 60 MG capsule Take 1 capsule (60 mg total) by mouth daily. Take with 30 mg capsule for a total of 90 mg daily 90 capsule 3  . gabapentin (NEURONTIN) 300 MG capsule Take 2 capsules (600 mg total) by mouth 4 (four) times daily. 720 capsule 3  . lenalidomide (REVLIMID) 25 MG capsule TAKE 1 CAPSULE (25MG) BY MOUTH DAILY FOR 14 DAYS ON, 7 DAYS OFF REPEAT EVERY 21 DAYS 14 capsule 0  . lidocaine-prilocaine (EMLA) cream Apply to affected area once 30 g 3  . LORazepam (ATIVAN) 0.5 MG tablet Take 1 tablet (0.5 mg total) by mouth  every 6 (six) hours as needed (Nausea or vomiting). 30 tablet 0  . Melatonin 5 MG TABS Take 5 mg by mouth at bedtime.    . Multiple Vitamins-Minerals (MULTIVITAMIN WITH MINERALS) tablet Take 1 tablet by mouth daily.    Marland Kitchen omeprazole (PRILOSEC) 20 MG capsule Take 2 capsules (40 mg total) by mouth daily. (Patient taking differently: Take 20 mg by mouth daily. ) 180 capsule 3  . ondansetron (ZOFRAN) 8 MG tablet Take 1 tablet (8 mg total) by mouth 2 (two) times daily as needed (Nausea or vomiting). 30 tablet 1  . oxyCODONE-acetaminophen (PERCOCET) 10-325 MG tablet Take 1 tablet by mouth 5 (five) times daily as needed for pain.     Marland Kitchen prochlorperazine (COMPAZINE) 10 MG tablet Take 1 tablet (10 mg total) by mouth every 6 (six) hours as needed (Nausea or vomiting). 30 tablet 1  . temazepam (RESTORIL) 30 MG capsule Take 1 capsule (30 mg total) by mouth Nightly. 90 capsule 1   No current facility-administered medications for this visit.      Musculoskeletal: Strength & Muscle Tone: within normal limits Gait & Station: normal Patient leans: N/A  Psychiatric Specialty Exam: ROS  Blood pressure 129/69, pulse 98, height 5' (1.524 m), weight 179 lb (81.2 kg), SpO2 98 %.Body mass index is 34.96 kg/m.  General Appearance: Casual and Fairly Groomed  Eye Contact:  Fair  Speech:  Clear and Coherent and Normal Rate  Volume:  Normal  Mood:  Dysphoric and Euthymic  Affect:  Congruent  Thought Process:  Coherent, Goal  Directed and Descriptions of Associations: Intact  Orientation:  Full (Time, Place, and Person)  Thought Content: Logical   Suicidal Thoughts:  No  Homicidal Thoughts:  No  Memory:  Immediate;   Fair  Judgement:  Good  Insight:  Good  Psychomotor Activity:  Normal  Concentration:  Attention Span: Good  Recall:  Good  Fund of Knowledge: Good  Language: Good  Akathisia:  Negative  Handed:  Right  AIMS (if indicated): not done  Assets:  Communication Skills Desire for Improvement   ADL's:  Intact  Cognition: WNL  Sleep:  Good   Screenings: PHQ2-9     Office Visit from 08/10/2013 in [facility]  PHQ-2 Total Score  1       Assessment and Plan:  MAXENE BYINGTON presents with fairly stable depressive symptoms in the context of recent diagnosis of multiple myeloma, she is actively undergoing chemotherapy.  She will be starting her third round at the end of this week.  She has excellent family support and support from her ex-husband and friends locally.  She has no acute safety concerns or suicidality.  She continues to contemplate her mortality, particularly in the face of this very serious illness.  We will follow-up in 8-10 weeks or sooner if needed.  Disclosed to patient that this Probation officer is leaving this practice at the end of August 2019, and patients always has the right to choose their provider. Reassured patient that office will work to provide smooth transition of care whether they wish to remain at this office, or to continue with this provider, or seek alternative care options in community.  They expressed understanding.   1. Major depressive disorder, recurrent episode, moderate ([place])   2. Psychophysiological insomnia   3. Severe episode of recurrent major depressive disorder, without psychotic features ([place])     Status of current problems: stable  Labs Ordered: No orders of the defined types were placed in this encounter.   Labs Reviewed: na  Collateral Obtained/Records Reviewed: na  Plan:  Continue Restoril 30 mg nightly Continue Cymbalta 90 mg daily Continue Abilify 2 mg daily Continue amitriptyline 25 mg nightly Continue gabapentin 600 mg 4 times daily Okay for patient to use Ativan 0.5 mg for nausea/vomiting; prescribed by oncology rtc 8 weeks  Aundra Dubin, MD 12/02/2017, 3:37 PM

## 2017-12-03 ENCOUNTER — Telehealth: Payer: Self-pay | Admitting: Hematology and Oncology

## 2017-12-03 ENCOUNTER — Inpatient Hospital Stay: Payer: PPO | Attending: Hematology and Oncology

## 2017-12-03 ENCOUNTER — Inpatient Hospital Stay (HOSPITAL_BASED_OUTPATIENT_CLINIC_OR_DEPARTMENT_OTHER): Payer: PPO | Admitting: Hematology and Oncology

## 2017-12-03 ENCOUNTER — Other Ambulatory Visit: Payer: Self-pay

## 2017-12-03 ENCOUNTER — Encounter: Payer: Self-pay | Admitting: Hematology and Oncology

## 2017-12-03 DIAGNOSIS — C9000 Multiple myeloma not having achieved remission: Secondary | ICD-10-CM

## 2017-12-03 DIAGNOSIS — E041 Nontoxic single thyroid nodule: Secondary | ICD-10-CM | POA: Insufficient documentation

## 2017-12-03 DIAGNOSIS — F329 Major depressive disorder, single episode, unspecified: Secondary | ICD-10-CM | POA: Diagnosis not present

## 2017-12-03 DIAGNOSIS — Z79899 Other long term (current) drug therapy: Secondary | ICD-10-CM | POA: Insufficient documentation

## 2017-12-03 DIAGNOSIS — D61818 Other pancytopenia: Secondary | ICD-10-CM

## 2017-12-03 DIAGNOSIS — Z5111 Encounter for antineoplastic chemotherapy: Secondary | ICD-10-CM | POA: Diagnosis not present

## 2017-12-03 LAB — COMPREHENSIVE METABOLIC PANEL
ALBUMIN: 3.2 g/dL — AB (ref 3.5–5.0)
ALT: 14 U/L (ref 0–55)
ANION GAP: 9 (ref 3–11)
AST: 28 U/L (ref 5–34)
Alkaline Phosphatase: 70 U/L (ref 40–150)
BILIRUBIN TOTAL: 0.4 mg/dL (ref 0.2–1.2)
BUN: 11 mg/dL (ref 7–26)
CO2: 24 mmol/L (ref 22–29)
Calcium: 8.3 mg/dL — ABNORMAL LOW (ref 8.4–10.4)
Chloride: 106 mmol/L (ref 98–109)
Creatinine, Ser: 0.71 mg/dL (ref 0.60–1.10)
GFR calc Af Amer: >60 mL/min (ref >60)
GFR calc non Af Amer: >60 mL/min (ref >60)
GLUCOSE: 128 mg/dL (ref 70–140)
Potassium: 4 mmol/L (ref 3.5–5.1)
SODIUM: 139 mmol/L (ref 136–145)
Total Protein: 6.1 g/dL — ABNORMAL LOW (ref 6.4–8.3)

## 2017-12-03 LAB — CBC WITH DIFFERENTIAL/PLATELET
BASOS ABS: 0 10*3/uL (ref 0.0–0.1)
BASOS PCT: 1 %
EOS ABS: 0 10*3/uL (ref 0.0–0.5)
Eosinophils Relative: 1 %
HEMATOCRIT: 28.9 % — AB (ref 34.8–46.6)
HEMOGLOBIN: 9.5 g/dL — AB (ref 11.6–15.9)
Lymphocytes Relative: 36 %
Lymphs Abs: 1.3 10*3/uL (ref 0.9–3.3)
MCH: 29.4 pg (ref 25.1–34.0)
MCHC: 32.7 g/dL (ref 31.5–36.0)
MCV: 89.7 fL (ref 79.5–101.0)
MONOS PCT: 15 %
Monocytes Absolute: 0.5 10*3/uL (ref 0.1–0.9)
NEUTROS ABS: 1.8 10*3/uL (ref 1.5–6.5)
NEUTROS PCT: 47 %
Platelets: 279 10*3/uL (ref 145–400)
RBC: 3.22 MIL/uL — AB (ref 3.70–5.45)
RDW: 17.9 % — ABNORMAL HIGH (ref 11.2–14.5)
WBC: 3.8 10*3/uL — AB (ref 3.9–10.3)

## 2017-12-03 LAB — URIC ACID: Uric Acid, Serum: 6.4 mg/dL (ref 2.6–7.4)

## 2017-12-03 MED ORDER — LENALIDOMIDE 15 MG PO CAPS: ORAL_CAPSULE | ORAL | 0 refills | Status: DC

## 2017-12-03 NOTE — Progress Notes (Signed)
[place] FOLLOW-UP progress notes  Patient Care Team: Deland Pretty, MD as PCP - General (Internal Medicine)  CHIEF COMPLAINTS/PURPOSE OF VISIT:  Multiple myeloma  HISTORY OF PRESENTING ILLNESS:  [name] 67 y.o. female was transferred to my care after her prior physician has left.  I reviewed the patient's records extensive and collaborated the history with the patient. Summary of her history is as follows:   Multiple myeloma not having achieved remission ([place])   09/22/2017 Initial Diagnosis    Multiple myeloma not having achieved remission ([place])      09/22/2017 Bone Marrow Biopsy    Pathology: Hypercellular bone marrow with marked elevation of kappa light chain restricted plasma cells accounting for 57% of cellularity. CytoGen: Normal cytogenetics FISH: Positive for-4,-14,13q-      10/08/2017 Tumor Marker    tProt 6.9, Alb 3.3, Ca 9.1, Cr 0.7, AP 90 LDH 151, beta-2 microglobulin 2.8; SPEP ...; IgG ..., IgA ..., IgM ...; kappa 1231, lambda 6.3, KLR 195       10/16/2017 PET scan    No focal hypermetabolic lesions identified. Large solid-appearing nodule in left lobe of thyroid gland noted. 42m left upper lobe lung nodule noted.      10/23/2017 -  Chemotherapy    Bortezomib SQ (VELCADE) 1.3 mg/m2 SQ d1,4,8,15 + Lenalidomide 238mPO QDay d1-15, + Dexamethasone 4012mO QWeek Q21d --Cycle #1, 10/23/17: --Cycle #2, 11/13/17:        10/23/2017 Cancer Staging    Staging form: Plasma Cell Myeloma and Plasma Cell Disorders, AJCC 8th Edition - Clinical stage from 10/23/2017: RISS Stage II (Beta-2-microglobulin (mg/L): 2.8, Albumin (g/dL): 3.3, ISS: Stage II, High-risk cytogenetics: Absent, LDH: Normal) - Signed by PerArdath SaxD on 11/12/2017      11/24/2017 Imaging    1. No pulmonary embolism. 2. Mild cardiomegaly. No acute pulmonary process. Stable 3 mm LEFT upper lobe pulmonary nodule. 3. Large hiatal hernia. 4. **An incidental finding of potential  clinical significance has been found. 3.7 cm LEFT thyroid nodule      11/26/2017 Imaging    LV EF: 55% -   60%      The moment I walked into the patient's room, she was angry that I did not want to refill her prescription Revlimid and made her wait for today's appointment She felt that my delay of refilling prescription of Revlimid is causing too much inconvenience to her life So far, she felt that she tolerated treatment very well with no side effects whatsoever She denies abnormal skin bruising or rash from Velcade The patient denies any recent signs or symptoms of bleeding such as spontaneous epistaxis, hematuria or hematochezia. She denies worsening bone pain or peripheral neuropathy from treatment She could not understand why I would not refill the prescription of Revlimid and why I insisted on reducing the dose of Revlimid given recent clinical picture of pancytopenia  MEDICAL HISTORY:  Past Medical History:  Diagnosis Date  . Anemia    2016 on iron for a while   . Anxiety   . ARTHRITIS 10/25/2007  . Arthritis   . Blood transfusion without reported diagnosis   . BREAST CANCER, HX OF 08/03/2006  . Cancer ([facility]  hx of breast ca x 2 on right   . DEPRESSION 08/03/2006  . Diabetes ([facility]  prediabetic   . DIABETES MELLITUS, TYPE II 08/03/2006  . DYSPNEA ON EXERTION 11/21/2009   shortness of breath with exertion   .  EPISTAXIS, RECURRENT 01/27/2008  . FIBROMYALGIA 01/13/2008  . GERD 04/30/2008  . HEPATITIS C 08/03/2006  . HIP PAIN, RIGHT 04/30/2007  . History of detached retina repair 02/12/2011  . HYPERTENSION 04/30/2008  . INSOMNIA-SLEEP DISORDER-UNSPEC 11/21/2009  . KNEE SPRAIN, ACUTE 01/27/2008  . LEG PAIN, LEFT 01/13/2008  . OSTEOPENIA 07/26/2007  . Persistent vomiting 08/17/2008  . Pneumonia    hx of   . Prediabetes   . Thyroid nodule     SURGICAL HISTORY: Past Surgical History:  Procedure Laterality Date  . ABDOMINAL HYSTERECTOMY    . BREAST LUMPECTOMY  2000   right with  axillary LND  . CHOLECYSTECTOMY  06/05/2012   Procedure: LAPAROSCOPIC CHOLECYSTECTOMY;  Surgeon: Adin Hector, MD;  Location: [facility];  Service: General;  Laterality: N/A;  . LAPAROSCOPIC LYSIS OF ADHESIONS  06/05/2012   Procedure: LAPAROSCOPIC LYSIS OF ADHESIONS;  Surgeon: Adin Hector, MD;  Location: [facility];  Service: General;  Laterality: N/A;  . MASTECTOMY  2007   Dr. Margot Chimes  . s/p right broken leg with surgury as teen    . s/p thyroid FNA neg.    . TONSILLECTOMY    . TOTAL HIP ARTHROPLASTY Right 01/25/2016   Procedure: RIGHT TOTAL HIP ARTHROPLASTY ANTERIOR APPROACH;  Surgeon: Mcarthur Rossetti, MD;  Location: [facility];  Service: Orthopedics;  Laterality: Right;  . TUBAL LIGATION      SOCIAL HISTORY: Social History   Socioeconomic History  . Marital status: Divorced    Spouse name: Not on file  . Number of children: Not on file  . Years of education: Not on file  . Highest education level: Not on file  Occupational History  . Occupation: Sports coach  Social Needs  . Financial resource strain: Not on file  . Food insecurity:    Worry: Not on file    Inability: Not on file  . Transportation needs:    Medical: Not on file    Non-medical: Not on file  Tobacco Use  . Smoking status: Former Smoker    Packs/day: 4.00    Years: 40.00    Pack years: 160.00    Types: Cigarettes    Last attempt to quit: 06/30/2005    Years since quitting: 12.4  . Smokeless tobacco: Never Used  . Tobacco comment: Pt smoked up to 4 ppd  Substance and Sexual Activity  . Alcohol use: Yes    Alcohol/week: 0.0 oz    Comment: Once a year  . Drug use: No  . Sexual activity: Not Currently  Lifestyle  . Physical activity:    Days per week: Not on file    Minutes per session: Not on file  . Stress: Not on file  Relationships  . Social connections:    Talks on phone: Not on file    Gets together: Not on file    Attends religious service: Not on file    Active member of club or  organization: Not on file    Attends meetings of clubs or organizations: Not on file    Relationship status: Not on file  . Intimate partner violence:    Fear of current or ex partner: Not on file    Emotionally abused: Not on file    Physically abused: Not on file    Forced sexual activity: Not on file  Other Topics Concern  . Not on file  Social History Narrative  . Not on file    FAMILY HISTORY: Family History  Problem  Relation Age of Onset  . Heart disease Mother   . Cancer Sister        breast  . Depression Father   . Depression Brother     ALLERGIES:  is allergic to ace inhibitors; contrast media [iodinated diagnostic agents]; iohexol; and tape.  MEDICATIONS:  Current Outpatient Medications  Medication Sig Dispense Refill  . acyclovir (ZOVIRAX) 400 MG tablet Take 1 tablet (400 mg total) by mouth 2 (two) times daily. 60 tablet 3  . amitriptyline (ELAVIL) 25 MG tablet Take 1 tablet (25 mg total) by mouth at bedtime. 90 tablet 1  . ARIPiprazole (ABILIFY) 2 MG tablet Take 1 tablet (2 mg total) by mouth daily. Start in the morning 90 tablet 1  . aspirin EC 81 MG tablet Take 162 mg by mouth daily.    Marland Kitchen CANNABIDIOL PO Take 1 Dose by mouth at bedtime as needed (sleep).    . cholestyramine (QUESTRAN) 4 GM/DOSE powder Take 1 packet (4 g total) by mouth 2 (two) times daily with a meal. (Patient taking differently: Take 4 g by mouth 2 (two) times daily as needed (diarrhea). ) 378 g 11  . dexamethasone (DECADRON) 4 MG tablet Take 2 and a half tablets (10 mg) on the day before and the day of Velcade chemotherapy. Repeat every 21 days. 20 tablet 3  . diphenhydrAMINE (BENADRYL) 25 MG tablet Take 25 mg by mouth every 6 (six) hours as needed.    . DULoxetine (CYMBALTA) 30 MG capsule Take 1 capsule (30 mg total) by mouth daily. Take with 60 mg capsule for a total of 90 mg daily 90 capsule 3  . DULoxetine (CYMBALTA) 60 MG capsule Take 1 capsule (60 mg total) by mouth daily. Take with 30 mg  capsule for a total of 90 mg daily 90 capsule 3  . gabapentin (NEURONTIN) 300 MG capsule Take 2 capsules (600 mg total) by mouth 4 (four) times daily. 720 capsule 3  . lenalidomide (REVLIMID) 15 MG capsule TAKE 1 CAPSULE (15MG) BY MOUTH DAILY FOR 14 DAYS ON, 7 DAYS OFF REPEAT EVERY 21 DAYS 14 capsule 0  . LORazepam (ATIVAN) 0.5 MG tablet Take 1 tablet (0.5 mg total) by mouth every 6 (six) hours as needed (Nausea or vomiting). 30 tablet 0  . Melatonin 5 MG TABS Take 5 mg by mouth at bedtime.    . Multiple Vitamins-Minerals (MULTIVITAMIN WITH MINERALS) tablet Take 1 tablet by mouth daily.    Marland Kitchen omeprazole (PRILOSEC) 20 MG capsule Take 2 capsules (40 mg total) by mouth daily. (Patient taking differently: Take 20 mg by mouth daily. ) 180 capsule 3  . ondansetron (ZOFRAN) 8 MG tablet Take 1 tablet (8 mg total) by mouth 2 (two) times daily as needed (Nausea or vomiting). 30 tablet 1  . oxyCODONE-acetaminophen (PERCOCET) 10-325 MG tablet Take 1 tablet by mouth 5 (five) times daily as needed for pain.     Marland Kitchen prochlorperazine (COMPAZINE) 10 MG tablet Take 1 tablet (10 mg total) by mouth every 6 (six) hours as needed (Nausea or vomiting). 30 tablet 1  . temazepam (RESTORIL) 30 MG capsule Take 1 capsule (30 mg total) by mouth Nightly. 90 capsule 1   No current facility-administered medications for this visit.     REVIEW OF SYSTEMS:   Constitutional: Denies fevers, chills or abnormal night sweats Eyes: Denies blurriness of vision, double vision or watery eyes Ears, nose, mouth, throat, and face: Denies mucositis or sore throat Respiratory: Denies cough, dyspnea or wheezes Cardiovascular:  Denies palpitation, chest discomfort or lower extremity swelling Gastrointestinal:  Denies nausea, heartburn or change in bowel habits Skin: Denies abnormal skin rashes Lymphatics: Denies new lymphadenopathy or easy bruising Neurological:Denies numbness, tingling or new weaknesses Behavioral/Psych: Mood is stable, no new  changes  All other systems were reviewed with the patient and are negative.  PHYSICAL EXAMINATION: ECOG PERFORMANCE STATUS: 1 - Symptomatic but completely ambulatory  Vitals:   12/03/17 1109  BP: (!) 144/86  Pulse: 99  Resp: 18  Temp: 98.6 F (37 C)  SpO2: 94%   Filed Weights   12/03/17 1109  Weight: 179 lb 9.6 oz (81.5 kg)    GENERAL:alert, no distress and comfortable but clearly appears upset and angry throughout the entire visit I am not able to examine the patient due to the patient insistence she wants to leave after meeting with Dr. Julien Nordmann  LABORATORY DATA:  I have reviewed the data as listed Lab Results  Component Value Date   WBC 3.8 (L) 12/03/2017   HGB 9.5 (L) 12/03/2017   HCT 28.9 (L) 12/03/2017   MCV 89.7 12/03/2017   PLT 279 12/03/2017   Recent Labs    11/13/17 1425 11/20/17 1425 11/24/17 1526 12/03/17 1050  NA 136 134* 134* 139  K 5.0 4.3 4.0 4.0  CL 104 101 101 106  CO2 22 22 23 24   GLUCOSE 211* 145* 103* 128  BUN 12 12 6 11   CREATININE 0.78 0.72 0.58 0.71  CALCIUM 8.9 8.2* 7.6* 8.3*  GFRNONAA >60 >60 >60 >60  GFRAA >60 >60 >60 >60  PROT 6.4 6.8  --  6.1*  ALBUMIN 3.4* 3.5  --  3.2*  AST 25 52*  --  28  ALT 29 37  --  14  ALKPHOS 75 61  --  70  BILITOT 0.3 0.4  --  0.4    RADIOGRAPHIC STUDIES: I have personally reviewed the radiological images as listed and agreed with the findings in the report. Dg Chest 2 View  Result Date: 11/24/2017 CLINICAL DATA:  68 year old female with shortness of breath. Undergoing chemotherapy for multiple myeloma. EXAM: CHEST - 2 VIEW COMPARISON:  PET-CT 10/16/2017, chest radiographs 04/04/2016. FINDINGS: Moderate size gastric hiatal hernia plus mild tortuosity of the thoracic aorta. Stable mediastinal contours. Visualized tracheal air column is within normal limits. No pneumothorax, pulmonary edema, pleural effusion or acute pulmonary opacity. Mild chronic asymmetric opacity at the overlap of the right anterior  6th and posterior 8th ribs. No destructive osseous lesion is evident. Paucity of bowel gas in the upper abdomen. IMPRESSION: No acute cardiopulmonary abnormality.  Chronic hiatal hernia. Electronically Signed   By: Genevie Ann M.D.   On: 11/24/2017 14:49   Ct Angio Chest Pe W/cm &/or Wo Cm  Result Date: 11/24/2017 CLINICAL DATA:  Shortness of breath for 3 days. On chemotherapy for multiple myeloma. History of breast cancer. EXAM: CT ANGIOGRAPHY CHEST WITH CONTRAST TECHNIQUE: Multidetector CT imaging of the chest was performed using the standard protocol during bolus administration of intravenous contrast. Multiplanar CT image reconstructions and MIPs were obtained to evaluate the vascular anatomy. CONTRAST:  136m ISOVUE-370 IOPAMIDOL (ISOVUE-370) INJECTION 76% COMPARISON:  PET CT October 16, 2017 and chest radiograph Nov 24, 2017 FINDINGS: CARDIOVASCULAR: Adequate contrast opacification of the pulmonary artery's. Main pulmonary artery is not enlarged. No pulmonary arterial filling defects to the level of the subsegmental branches. Heart size is mildly enlarged. Mild coronary artery calcification. No pericardial effusion. Thoracic aorta is normal course and caliber, mild calcific  atherosclerosis. MEDIASTINUM/NODES: No lymphadenopathy by CT size criteria. LUNGS/PLEURA: Tracheobronchial tree is patent, no pneumothorax. No pleural effusions, focal consolidations, or masses. Stable 3 mm lingular pulmonary nodule (series 6, image 62). UPPER ABDOMEN: Nonacute. Large hiatal hernia. Status post cholecystectomy. MUSCULOSKELETAL: Nonacute. RIGHT possibly LEFT mastectomies. Healing RIGHT sixth and seventh lateral rib fractures. Broad dextroscoliosis. Review of the MIP images confirms the above findings. IMPRESSION: 1. No pulmonary embolism. 2. Mild cardiomegaly. No acute pulmonary process. Stable 3 mm LEFT upper lobe pulmonary nodule. 3. Large hiatal hernia. 4. **An incidental finding of potential clinical significance has been  found. 3.7 cm LEFT thyroid nodule. This follows ACR consensus guidelines: Managing Incidental Thyroid Nodules Detected on Imaging: White Paper of the ACR Incidental Thyroid Findings Committee. J Am Coll Radiol 2015; 12:143-150.** Aortic Atherosclerosis (ICD10-I70.0). Electronically Signed   By: Elon Alas M.D.   On: 11/24/2017 20:19    ASSESSMENT & PLAN:  Multiple myeloma not having achieved remission ([place]) Myeloma panel is pending I discussed the plan of care with the patient and she seems angry that it differs significantly from her understanding after talking to Dr. Erlene Senters We discussed treatment plan would include induction chemotherapy followed by possibility of bone marrow transplant evaluation/referral The patient has developed recent profound pancytopenia recently and hence the delay of prescription renewal of Revlimid I felt that the dose of Revlimid is too strong I recommend reducing the dose to 15 mg daily I explained to the patient the rationale behind dose reduction and warned her about the potential risk and danger of worsening bone marrow suppression and the risk of bleeding with severe thrombocytopenia The patient seems extremely angry and frustrated at the suggestion of dose adjustment I have requested my medical director, Dr. Julien Nordmann to discussed with the patient and ultimately, she agreed for lower dose treatment As soon as Dr. Julien Nordmann has left, she requested to leave the clinic and does not want to discuss further her plan of care  Ultimately, I am not able to establish rapport with the patient At the time that the visit concluded, the patient was angry and hostile towards me  I have written prescription for reduced dose Revlimid at 15 mg and I have signed chemotherapy order for the next 4 treatment She is reminded to take aspirin for DVT prophylaxis She is also instructed to continue taking acyclovir for antimicrobial prophylaxis She has received recent  dental clearance from Dr. Enrique Sack to proceed with zometa  Pancytopenia, acquired [facility]) She has acquired pancytopenia due to side effects of treatment She is currently not symptomatic Her platelet count has improved since her last blood draw As above, I tried to explain to the patient the rationale of dose reduction of chemotherapy The patient is angry at the suggestion but ultimately agreed for lower dose of Revlimid   No orders of the defined types were placed in this encounter.   All questions were answered. The patient knows to call the clinic with any problems, questions or concerns. I spent 40 minutes counseling the patient face to face. The total time spent in the appointment was 55 minutes and more than 50% was on counseling.     Heath Lark, MD 12/03/2017 1:14 PM

## 2017-12-03 NOTE — Assessment & Plan Note (Addendum)
Myeloma panel is pending I discussed the plan of care with the patient and she seems angry that it differs significantly from her understanding after talking to Dr. Erlene Senters We discussed treatment plan would include induction chemotherapy followed by possibility of bone marrow transplant evaluation/referral The patient has developed recent profound pancytopenia recently and hence the delay of prescription renewal of Revlimid I felt that the dose of Revlimid is too strong I recommend reducing the dose to 15 mg daily I explained to the patient the rationale behind dose reduction and warned her about the potential risk and danger of worsening bone marrow suppression and the risk of bleeding with severe thrombocytopenia The patient seems extremely angry and frustrated at the suggestion of dose adjustment I have requested my medical director, Dr. Julien Nordmann to discussed with the patient and ultimately, she agreed for lower dose treatment As soon as Dr. Julien Nordmann has left, she requested to leave the clinic and does not want to discuss further her plan of care  Ultimately, I am not able to establish rapport with the patient At the time that the visit concluded, the patient was angry and hostile towards me  I have written prescription for reduced dose Revlimid at 15 mg and I have signed chemotherapy order for the next 4 treatment She is reminded to take aspirin for DVT prophylaxis She is also instructed to continue taking acyclovir for antimicrobial prophylaxis She has received recent dental clearance from Dr. Enrique Sack to proceed with zometa

## 2017-12-03 NOTE — Assessment & Plan Note (Signed)
She has acquired pancytopenia due to side effects of treatment She is currently not symptomatic Her platelet count has improved since her last blood draw As above, I tried to explain to the patient the rationale of dose reduction of chemotherapy The patient is angry at the suggestion but ultimately agreed for lower dose of Revlimid

## 2017-12-03 NOTE — Telephone Encounter (Signed)
Gave avs and calendar ° °

## 2017-12-04 ENCOUNTER — Other Ambulatory Visit: Payer: Self-pay

## 2017-12-04 ENCOUNTER — Telehealth: Payer: Self-pay

## 2017-12-04 ENCOUNTER — Inpatient Hospital Stay: Payer: PPO

## 2017-12-04 VITALS — BP 149/76 | HR 86 | Temp 98.8°F | Resp 18

## 2017-12-04 DIAGNOSIS — Z5111 Encounter for antineoplastic chemotherapy: Secondary | ICD-10-CM | POA: Diagnosis not present

## 2017-12-04 DIAGNOSIS — C9000 Multiple myeloma not having achieved remission: Secondary | ICD-10-CM

## 2017-12-04 LAB — KAPPA/LAMBDA LIGHT CHAINS
Kappa free light chain: 277.8 mg/L — ABNORMAL HIGH (ref 3.3–19.4)
Kappa, lambda light chain ratio: 40.85 — ABNORMAL HIGH (ref 0.26–1.65)
Lambda free light chains: 6.8 mg/L (ref 5.7–26.3)

## 2017-12-04 MED ORDER — BORTEZOMIB CHEMO SQ INJECTION 3.5 MG (2.5MG/ML)
1.3000 mg/m2 | Freq: Once | INTRAMUSCULAR | Status: AC
  Administered 2017-12-04: 2.5 mg via SUBCUTANEOUS
  Filled 2017-12-04: qty 1

## 2017-12-04 MED ORDER — PROCHLORPERAZINE MALEATE 10 MG PO TABS: 10.0000 mg | ORAL_TABLET | Freq: Once | ORAL | Status: DC

## 2017-12-04 NOTE — Telephone Encounter (Signed)
She showed up in the lobby requesting to speak with the nurse. She saw Dr. Alvy Bimler yesterday in the office. She is requesting a second opinion at [place] given to Dr. Alvy Bimler. She is being transferred to Dr. Benay Spice, she is agreeable to transfer and will wait until she sees Dr. Benay Spice before getting a second opinion.  Scheduling message sent.

## 2017-12-04 NOTE — Progress Notes (Signed)
Per patient, she does not wish to get her zometa today. She is following up with another doctor regarding this.

## 2017-12-04 NOTE — Patient Instructions (Signed)
[facility] Discharge Instructions for Patients Receiving Chemotherapy  Today you received the following chemotherapy agents Velcade To help prevent nausea and vomiting after your treatment, we encourage you to take your nausea medication as prescribed.   If you develop nausea and vomiting that is not controlled by your nausea medication, call the clinic.   BELOW ARE SYMPTOMS THAT SHOULD BE REPORTED IMMEDIATELY:  *FEVER GREATER THAN 100.5 F  *CHILLS WITH OR WITHOUT FEVER  NAUSEA AND VOMITING THAT IS NOT CONTROLLED WITH YOUR NAUSEA MEDICATION  *UNUSUAL SHORTNESS OF BREATH  *UNUSUAL BRUISING OR BLEEDING  TENDERNESS IN MOUTH AND THROAT WITH OR WITHOUT PRESENCE OF ULCERS  *URINARY PROBLEMS  *BOWEL PROBLEMS  UNUSUAL RASH Items with * indicate a potential emergency and should be followed up as soon as possible.  Feel free to call the clinic should you have any questions or concerns. The clinic phone number is [phone].  Please show the CHEMO ALERT CARD at check-in to the Emergency Department and triage nurse.   

## 2017-12-07 ENCOUNTER — Ambulatory Visit: Payer: Self-pay | Admitting: Hematology and Oncology

## 2017-12-07 ENCOUNTER — Ambulatory Visit: Payer: Self-pay

## 2017-12-07 ENCOUNTER — Telehealth: Payer: Self-pay | Admitting: Oncology

## 2017-12-07 LAB — MULTIPLE MYELOMA PANEL, SERUM
ALBUMIN SERPL ELPH-MCNC: 3.2 g/dL (ref 2.9–4.4)
ALBUMIN/GLOB SERPL: 1.3 (ref 0.7–1.7)
ALPHA 1: 0.3 g/dL (ref 0.0–0.4)
Alpha2 Glob SerPl Elph-Mcnc: 0.8 g/dL (ref 0.4–1.0)
B-GLOBULIN SERPL ELPH-MCNC: 0.8 g/dL (ref 0.7–1.3)
GAMMA GLOB SERPL ELPH-MCNC: 0.6 g/dL (ref 0.4–1.8)
GLOBULIN, TOTAL: 2.5 g/dL (ref 2.2–3.9)
IgA: 470 mg/dL — ABNORMAL HIGH (ref 87–352)
IgG (Immunoglobin G), Serum: 353 mg/dL — ABNORMAL LOW (ref 700–1600)
IgM (Immunoglobulin M), Srm: 37 mg/dL (ref 26–217)
M PROTEIN SERPL ELPH-MCNC: 0.3 g/dL — AB
Total Protein ELP: 5.7 g/dL — ABNORMAL LOW (ref 6.0–8.5)

## 2017-12-07 NOTE — Telephone Encounter (Signed)
Called pt re appts that were added per 6/6 sch msg - spoke w/ pt re appts.

## 2017-12-08 ENCOUNTER — Inpatient Hospital Stay: Payer: PPO

## 2017-12-08 ENCOUNTER — Inpatient Hospital Stay (HOSPITAL_BASED_OUTPATIENT_CLINIC_OR_DEPARTMENT_OTHER): Payer: PPO | Admitting: Oncology

## 2017-12-08 VITALS — BP 149/81 | HR 110 | Temp 98.6°F | Resp 18 | Ht 60.0 in | Wt 173.6 lb

## 2017-12-08 VITALS — HR 90

## 2017-12-08 DIAGNOSIS — C9000 Multiple myeloma not having achieved remission: Secondary | ICD-10-CM

## 2017-12-08 DIAGNOSIS — D649 Anemia, unspecified: Secondary | ICD-10-CM

## 2017-12-08 DIAGNOSIS — Z79899 Other long term (current) drug therapy: Secondary | ICD-10-CM | POA: Diagnosis not present

## 2017-12-08 DIAGNOSIS — E041 Nontoxic single thyroid nodule: Secondary | ICD-10-CM | POA: Diagnosis not present

## 2017-12-08 DIAGNOSIS — F329 Major depressive disorder, single episode, unspecified: Secondary | ICD-10-CM | POA: Diagnosis not present

## 2017-12-08 DIAGNOSIS — Z5111 Encounter for antineoplastic chemotherapy: Secondary | ICD-10-CM | POA: Diagnosis not present

## 2017-12-08 MED ORDER — BORTEZOMIB CHEMO SQ INJECTION 3.5 MG (2.5MG/ML)
1.3000 mg/m2 | Freq: Once | INTRAMUSCULAR | Status: AC
  Administered 2017-12-08: 2.5 mg via SUBCUTANEOUS
  Filled 2017-12-08: qty 1

## 2017-12-08 MED ORDER — PROCHLORPERAZINE MALEATE 10 MG PO TABS: 10.0000 mg | ORAL_TABLET | Freq: Once | ORAL | Status: DC

## 2017-12-08 NOTE — Progress Notes (Signed)
Pt refused the Zometa for today.

## 2017-12-08 NOTE — Patient Instructions (Signed)
[facility] Discharge Instructions for Patients Receiving Chemotherapy  Today you received the following chemotherapy agents velcade  To help prevent nausea and vomiting after your treatment, we encourage you to take your nausea medication as directed.   If you develop nausea and vomiting that is not controlled by your nausea medication, call the clinic.   BELOW ARE SYMPTOMS THAT SHOULD BE REPORTED IMMEDIATELY:  *FEVER GREATER THAN 100.5 F  *CHILLS WITH OR WITHOUT FEVER  NAUSEA AND VOMITING THAT IS NOT CONTROLLED WITH YOUR NAUSEA MEDICATION  *UNUSUAL SHORTNESS OF BREATH  *UNUSUAL BRUISING OR BLEEDING  TENDERNESS IN MOUTH AND THROAT WITH OR WITHOUT PRESENCE OF ULCERS  *URINARY PROBLEMS  *BOWEL PROBLEMS  UNUSUAL RASH Items with * indicate a potential emergency and should be followed up as soon as possible.  Feel free to call the clinic should you have any questions or concerns. The clinic phone number is [phone].  Please show the CHEMO ALERT CARD at check-in to the Emergency Department and triage nurse.   

## 2017-12-08 NOTE — Progress Notes (Signed)
[facility] OFFICE PROGRESS NOTE   Diagnosis: Myeloma myeloma  INTERVAL HISTORY:   [name] has been followed by Dr. Lebron Conners since being diagnosed with multiple myeloma in March of this year.  She was referred for evaluation of leukopenia and anemia.  Bone marrow biopsy 09/22/2017 confirmed involvement with 57% plasma cells with kappa light chain restriction.  Iron stores were decreased.  Molecular analysis revealed normal cytogenetics, a FISH confirmed a -4, -14, and 13q-abnormalities.  She has been treated with RVD chemotherapy with Velcade given on a day 1, 4, 8, and 11 scheduled.  She is currently at day 5 of cycle 3.  She developed thrombocytopenia following cycle 2 and the Revlimid was dose reduced to 15 mg with cycle 3.  She had an episode of diarrhea last weekend, relieved with Imodium.  She complains of a dry mouth since starting systemic therapy.  Past medical history: 1.  Breast cancer, right-sided on 2 occasions 2.   Depression 3.  Fibromyalgia 4.  Hepatitis C 5.  G1, P0, 1 miscarriage 6.  Thyroid nodule noted on PET scan 10/16/2017 7.  Left mastectomy 2007 secondary to mastitis,?  Chemotherapy-induced   Review of systems: Positives include: Dry mouth, episode of dyspnea 11/24/2017-resolved, diarrhea during the weekend of 12/04/2017-resolved The remainder of a complete review of systems was negative Objective:  Vital signs in last 24 hours:  Blood pressure (!) 149/81, pulse (!) 110, temperature 98.6 F (37 C), temperature source Oral, resp. rate 18, height 5' (1.524 m), weight 173 lb 9.6 oz (78.7 kg), SpO2 98 %.    HEENT: No thrush or ulcers Resp: Lungs clear bilaterally Cardio: Regular rate and rhythm GI: No hepatosplenomegaly Vascular: No leg edema Breast: Status post bilateral mastectomy.  No evidence for chest wall tumor recurrence.   Lab Results:  Lab Results  Component Value Date   WBC 3.8 (L) 12/03/2017   HGB 9.5 (L) 12/03/2017   HCT  28.9 (L) 12/03/2017   MCV 89.7 12/03/2017   PLT 279 12/03/2017   NEUTROABS 1.8 12/03/2017    CMP  Lab Results  Component Value Date   NA 139 12/03/2017   K 4.0 12/03/2017   CL 106 12/03/2017   CO2 24 12/03/2017   GLUCOSE 128 12/03/2017   BUN 11 12/03/2017   CREATININE 0.71 12/03/2017   CALCIUM 8.3 (L) 12/03/2017   PROT 6.1 (L) 12/03/2017   ALBUMIN 3.2 (L) 12/03/2017   AST 28 12/03/2017   ALT 14 12/03/2017   ALKPHOS 70 12/03/2017   BILITOT 0.4 12/03/2017   GFRNONAA >60 12/03/2017   GFRAA >60 12/03/2017   Serum free kappa light chains on 12/03/2017: 277.8  Medications: I have reviewed the patient's current medications.   Assessment/Plan:   1.  Multiple myeloma, IgA kappa diagnosed on bone marrow biopsy 09/22/2017  57% plasma cells on initial bone marrow biopsy, kappa light chain restricted  Normal cytogenetics, FISH with -4, -14, and 13q-abnormalities  PET scan 10/16/2017-negative for bone lesions  Cycle 1 RVD 10/23/2017  Cycle 2 RVD 11/13/2017  Cycle 3 RVD 12/04/2017 (Revlimid dose reduced to 15 mg)  2.  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  Depression  4.  Remote history of breast cancer  5.  Left thyroid solid nodule on PET scan 10/16/2017  Disposition: [name] is currently completing cycle 3 RVD given for treatment of multiple myeloma.  I discussed the current treatment plans and future treatment options with her.  The serum light chains  were significantly lower following cycle 2.  She therefore appears to be responding to the current systemic therapy regimens.  I agree with the dose reduction of Revlimid by Dr. Alvy Bimler.  She will complete cycle 3 RVD over the next week.  I will see her for an office and lab visit on 12/23/2017.  We will repeat serum light chains on 12/23/2017.  We discussed switching to a weekly Velcade regimen beginning with the next cycle.  She is reluctant to make changes to the current treatment regimen.  We will discuss  this further when she returns on 12/23/2017.  We discussed the indication for stem cell therapy in her case.  I will refer her to Dr. Tinnie Gens [facility] for a transplant evaluation.  She request a referral to evaluate the thyroid nodule.  We will refer her for a thyroid ultrasound and to interventional radiology.  45 minutes were spent with the patient today.  The majority of the time was used for counseling and coordination of care.  Betsy Coder, MD  12/08/2017  5:09 PM

## 2017-12-09 ENCOUNTER — Telehealth: Payer: Self-pay | Admitting: Oncology

## 2017-12-09 NOTE — Telephone Encounter (Signed)
Scheduled appt per 6/11 los - pt is aware of appt date and time   

## 2017-12-10 ENCOUNTER — Other Ambulatory Visit: Payer: Self-pay | Admitting: Oncology

## 2017-12-10 ENCOUNTER — Telehealth: Payer: Self-pay | Admitting: *Deleted

## 2017-12-10 NOTE — Telephone Encounter (Signed)
Returned telephone call to patient. Discussed referral to [facility] and remaining chemo scheduling. Patient verbalized an understanding and agrees to the changes made. No lab is necessary prior to Velcade injection on 6/14. Patient will be transferring care to Dr. Norma Fredrickson at [facility] at the completion of her third cycle here.

## 2017-12-11 ENCOUNTER — Inpatient Hospital Stay: Payer: PPO

## 2017-12-11 ENCOUNTER — Ambulatory Visit: Payer: Self-pay | Admitting: Hematology and Oncology

## 2017-12-11 ENCOUNTER — Other Ambulatory Visit: Payer: Self-pay

## 2017-12-11 VITALS — BP 135/72 | HR 91 | Temp 98.6°F | Resp 18

## 2017-12-11 DIAGNOSIS — Z5111 Encounter for antineoplastic chemotherapy: Secondary | ICD-10-CM | POA: Diagnosis not present

## 2017-12-11 DIAGNOSIS — C9000 Multiple myeloma not having achieved remission: Secondary | ICD-10-CM

## 2017-12-11 MED ORDER — PROCHLORPERAZINE MALEATE 10 MG PO TABS: 10.0000 mg | ORAL_TABLET | Freq: Once | ORAL | Status: DC

## 2017-12-11 MED ORDER — BORTEZOMIB CHEMO SQ INJECTION 3.5 MG (2.5MG/ML)
1.3000 mg/m2 | Freq: Once | INTRAMUSCULAR | Status: AC
  Administered 2017-12-11: 2.5 mg via SUBCUTANEOUS
  Filled 2017-12-11: qty 1

## 2017-12-11 NOTE — Patient Instructions (Signed)
[facility] Discharge Instructions for Patients Receiving Chemotherapy  Today you received the following chemotherapy agents Velcade.  To help prevent nausea and vomiting after your treatment, we encourage you to take your nausea medication as directed.  If you develop nausea and vomiting that is not controlled by your nausea medication, call the clinic.   BELOW ARE SYMPTOMS THAT SHOULD BE REPORTED IMMEDIATELY:  *FEVER GREATER THAN 100.5 F  *CHILLS WITH OR WITHOUT FEVER  NAUSEA AND VOMITING THAT IS NOT CONTROLLED WITH YOUR NAUSEA MEDICATION  *UNUSUAL SHORTNESS OF BREATH  *UNUSUAL BRUISING OR BLEEDING  TENDERNESS IN MOUTH AND THROAT WITH OR WITHOUT PRESENCE OF ULCERS  *URINARY PROBLEMS  *BOWEL PROBLEMS  UNUSUAL RASH Items with * indicate a potential emergency and should be followed up as soon as possible.  Feel free to call the clinic should you have any questions or concerns. The clinic phone number is [phone].  Please show the CHEMO ALERT CARD at check-in to the Emergency Department and triage nurse.   

## 2017-12-14 ENCOUNTER — Telehealth: Payer: Self-pay

## 2017-12-14 ENCOUNTER — Telehealth: Payer: Self-pay | Admitting: *Deleted

## 2017-12-14 ENCOUNTER — Ambulatory Visit: Payer: Self-pay

## 2017-12-14 ENCOUNTER — Other Ambulatory Visit: Payer: Self-pay

## 2017-12-14 ENCOUNTER — Other Ambulatory Visit: Payer: Self-pay | Admitting: Hematology and Oncology

## 2017-12-14 DIAGNOSIS — C9000 Multiple myeloma not having achieved remission: Secondary | ICD-10-CM

## 2017-12-14 NOTE — Telephone Encounter (Signed)
Pt called requesting referral to a doctor for respiratory issues. Per Dr. Benay Spice, pt to consult PCP for referral. Pt voiced understanding and expressed gratitude.

## 2017-12-14 NOTE — Telephone Encounter (Signed)
PLs see staff message

## 2017-12-14 NOTE — Telephone Encounter (Signed)
LM to call Dr Calton Dach nurse regarding refill

## 2017-12-14 NOTE — Telephone Encounter (Deleted)
-----   Message from Heath Lark, MD sent at 12/14/2017  7:49 AM EDT ----- Regarding: revlimid refill This patient switched to Dr. Benay Spice and is now switching to [facility]. I refilled her last Revlimid after Dr. Lebron Conners has left  I reduced her Revlimid from 25 to 15 mg and she was angry.  Her Revlimid is due for refill now. Can you ask if she wants to wait until she sees her new doctor at [facility] or refill at 15 mg? If she is willing to get 15 mg, ok to refill.  If patient is verbally abusive, please document every word in her chart

## 2017-12-15 ENCOUNTER — Other Ambulatory Visit: Payer: Self-pay

## 2017-12-15 ENCOUNTER — Inpatient Hospital Stay: Payer: PPO

## 2017-12-15 VITALS — BP 160/82 | HR 88 | Temp 98.2°F | Resp 18

## 2017-12-15 DIAGNOSIS — C9000 Multiple myeloma not having achieved remission: Secondary | ICD-10-CM

## 2017-12-15 DIAGNOSIS — Z5111 Encounter for antineoplastic chemotherapy: Secondary | ICD-10-CM | POA: Diagnosis not present

## 2017-12-15 DIAGNOSIS — D61818 Other pancytopenia: Secondary | ICD-10-CM

## 2017-12-15 LAB — CBC WITH DIFFERENTIAL (CANCER CENTER ONLY)
Basophils Absolute: 0.1 10*3/uL (ref 0.0–0.1)
Basophils Relative: 1 %
EOS ABS: 0 10*3/uL (ref 0.0–0.5)
Eosinophils Relative: 1 %
HCT: 32.8 % — ABNORMAL LOW (ref 34.8–46.6)
Hemoglobin: 10.6 g/dL — ABNORMAL LOW (ref 11.6–15.9)
LYMPHS ABS: 1.4 10*3/uL (ref 0.9–3.3)
LYMPHS PCT: 20 %
MCH: 28.7 pg (ref 25.1–34.0)
MCHC: 32.2 g/dL (ref 31.5–36.0)
MCV: 89.2 fL (ref 79.5–101.0)
Monocytes Absolute: 0.9 10*3/uL (ref 0.1–0.9)
Monocytes Relative: 12 %
NEUTROS PCT: 66 %
NRBC: 2 /100{WBCs} — AB
Neutro Abs: 4.6 10*3/uL (ref 1.5–6.5)
Platelet Count: 82 10*3/uL — ABNORMAL LOW (ref 145–400)
RBC: 3.67 MIL/uL — AB (ref 3.70–5.45)
RDW: 18.2 % — ABNORMAL HIGH (ref 11.2–14.5)
WBC: 7 10*3/uL (ref 3.9–10.3)

## 2017-12-15 MED ORDER — BORTEZOMIB CHEMO SQ INJECTION 3.5 MG (2.5MG/ML): 1.3000 mg/m2 | Freq: Once | INTRAMUSCULAR | Status: DC

## 2017-12-15 MED ORDER — PROCHLORPERAZINE MALEATE 10 MG PO TABS: 10.0000 mg | ORAL_TABLET | Freq: Once | ORAL | Status: DC

## 2017-12-15 NOTE — Progress Notes (Signed)
No treatment today per Dr. Benay Spice due to platelet count of 82. Infusion RN notified

## 2017-12-15 NOTE — Patient Instructions (Signed)

## 2017-12-15 NOTE — Progress Notes (Signed)
Ok to treat with CMP from 12/03/17 per Dr. Benay Spice

## 2017-12-15 NOTE — Progress Notes (Signed)
Pt states "I will not be receiving the Zometa today, I am moving to [place] and they do not agree with that treatment".  Melia, RN for Dr. Benay Spice notified.   Per Dr. Benay Spice hold Velcade injection today due to plt count being 82.  Pt informed and extremely upset.  Pt also educated to return on Friday per Dr. Benay Spice for repeat labs.

## 2017-12-15 NOTE — Telephone Encounter (Signed)
Pt states she will call us if she needs a refill. She is waiting for her appt with [facility].

## 2017-12-16 ENCOUNTER — Telehealth: Payer: Self-pay

## 2017-12-16 ENCOUNTER — Telehealth: Payer: Self-pay | Admitting: Oncology

## 2017-12-16 NOTE — Telephone Encounter (Signed)
Called pt re appt that was added per 6/18 sch msg - left vm for pt re appts.

## 2017-12-16 NOTE — Telephone Encounter (Signed)
Returned call to pt regarding scheduling infusion appt for Friday with lab appt. Per Dr. Benay Spice, no infusion appt needed, will forego this cycle of velcade to allow counts to recover. Pt informed that labs Friday are to recheck platelet count from previous visit. Pt voiced confusion and states "if i'm not going to be scheduled for the velcade, I see no reason to come in on Friday for labs". This RN voiced understanding, will make MD aware.

## 2017-12-17 ENCOUNTER — Ambulatory Visit (HOSPITAL_COMMUNITY)
Admission: RE | Admit: 2017-12-17 | Discharge: 2017-12-17 | Disposition: A | Discharge status: Home / Self Care | Payer: PPO | Source: Ambulatory Visit | Attending: Oncology | Admitting: Oncology

## 2017-12-17 DIAGNOSIS — C9000 Multiple myeloma not having achieved remission: Secondary | ICD-10-CM | POA: Insufficient documentation

## 2017-12-17 DIAGNOSIS — E042 Nontoxic multinodular goiter: Secondary | ICD-10-CM | POA: Diagnosis not present

## 2017-12-17 DIAGNOSIS — E041 Nontoxic single thyroid nodule: Secondary | ICD-10-CM | POA: Diagnosis not present

## 2017-12-18 ENCOUNTER — Other Ambulatory Visit: Payer: Self-pay

## 2017-12-18 ENCOUNTER — Ambulatory Visit: Payer: Self-pay

## 2017-12-21 DIAGNOSIS — R0789 Other chest pain: Secondary | ICD-10-CM | POA: Diagnosis not present

## 2017-12-21 DIAGNOSIS — C9000 Multiple myeloma not having achieved remission: Secondary | ICD-10-CM | POA: Diagnosis not present

## 2017-12-21 DIAGNOSIS — K449 Diaphragmatic hernia without obstruction or gangrene: Secondary | ICD-10-CM | POA: Diagnosis not present

## 2017-12-21 DIAGNOSIS — M8458XA Pathological fracture in neoplastic disease, other specified site, initial encounter for fracture: Secondary | ICD-10-CM | POA: Diagnosis not present

## 2017-12-21 DIAGNOSIS — S2241XA Multiple fractures of ribs, right side, initial encounter for closed fracture: Secondary | ICD-10-CM | POA: Diagnosis not present

## 2017-12-22 ENCOUNTER — Ambulatory Visit: Payer: PPO | Admitting: Pulmonary Disease

## 2017-12-22 ENCOUNTER — Other Ambulatory Visit: Payer: Self-pay

## 2017-12-22 ENCOUNTER — Telehealth: Payer: Self-pay | Admitting: Pulmonary Disease

## 2017-12-22 ENCOUNTER — Encounter: Payer: Self-pay | Admitting: Pulmonary Disease

## 2017-12-22 ENCOUNTER — Encounter (INDEPENDENT_AMBULATORY_CARE_PROVIDER_SITE_OTHER): Payer: Self-pay

## 2017-12-22 ENCOUNTER — Ambulatory Visit: Payer: Self-pay

## 2017-12-22 VITALS — BP 124/74 | HR 94 | Ht 60.0 in | Wt 176.4 lb

## 2017-12-22 DIAGNOSIS — R0602 Shortness of breath: Secondary | ICD-10-CM | POA: Diagnosis not present

## 2017-12-22 LAB — NITRIC OXIDE: Nitric Oxide: 17

## 2017-12-22 NOTE — Telephone Encounter (Signed)
Called [facility] to obtain PNA vaccine dates.  Advised that a nurse would return my call.

## 2017-12-22 NOTE — Patient Instructions (Signed)
I have reviewed your chest x-ray from yesterday which does show 2 rib fractures on the right side We will treat this symptomatically for now with over-the-counter pain medication such as Motrin, Advil Use warm compresses to the area and over-the-counter patches such as Salon Pas, lidocaine patch Follow-up in 2 months.

## 2017-12-22 NOTE — Telephone Encounter (Signed)
[name] Dr.Pharr office is calling back [phone]  Pt had a prevanr #13 pt had it done on 04/24/17 Pneumovax#23  Pt had it done on  06/30/00

## 2017-12-22 NOTE — Progress Notes (Signed)
EBONEY CLAYBROOK    [id number]    1950-11-21  Primary Care Physician:Pharr, Thayer Jew, MD  Referring Physician: Deland Pretty, MD [address], [place] [address]  Chief complaint: Consult for dyspnea, right chest pain  HPI: 67 year old with history of multiple myeloma, breast cancer, depression, fibromyalgia, hepatitis C, thyroid nodule.  Previously evaluated in the pulmonary clinic in 2016 for dyspnea on exertion.  PFTs at that time did not show any obstruction.  Symptoms were thought to be secondary to body habitus, deconditioning and advised diet, exercise, weight loss.  She has a recent diagnosis of multiple myeloma with PET CT showing no significant uptake.  Bone marrow biopsy shows 57% involvement.  She has undergone induction chemotherapy and is on Revlimid.  She has been seen at [facility] on 6/24 for transplant evaluation.    Complains of sudden onset right chest pain worsened with movements of the right arm which started 3 weeks ago..  She is unable to sleep on the right side.  Also has increasing dyspnea, unable to take a full breath.  Pets: No pets Occupation: Retired Chief Financial Officer, Sports coach Exposures: No known exposures, no leak, hot tub, Jacuzzi Smoking history: 120-pack-year smoking history.  Quit in 2010 Travel history: Lived in [place] for 30 years.  No other significant travel Relevant family history: No significant family history of lung disease  Outpatient Encounter Medications as of 12/22/2017  Medication Sig  . acyclovir (ZOVIRAX) 400 MG tablet Take 1 tablet (400 mg total) by mouth 2 (two) times daily.  Marland Kitchen amitriptyline (ELAVIL) 25 MG tablet Take 1 tablet (25 mg total) by mouth at bedtime.  . ARIPiprazole (ABILIFY) 2 MG tablet Take 1 tablet (2 mg total) by mouth daily. Start in the morning  . aspirin EC 81 MG tablet Take 162 mg by mouth daily.  Marland Kitchen CANNABIDIOL PO Take 1 Dose by mouth at bedtime as needed (sleep).  . cholestyramine  (QUESTRAN) 4 GM/DOSE powder Take 1 packet (4 g total) by mouth 2 (two) times daily with a meal.  . dexamethasone (DECADRON) 4 MG tablet Take 2 and a half tablets (10 mg) on the day before and the day of Velcade chemotherapy. Repeat every 21 days.  . diphenhydrAMINE (BENADRYL) 25 MG tablet Take 25 mg by mouth every 6 (six) hours as needed.  . DULoxetine (CYMBALTA) 30 MG capsule Take 1 capsule (30 mg total) by mouth daily. Take with 60 mg capsule for a total of 90 mg daily  . DULoxetine (CYMBALTA) 60 MG capsule Take 1 capsule (60 mg total) by mouth daily. Take with 30 mg capsule for a total of 90 mg daily  . gabapentin (NEURONTIN) 300 MG capsule Take 2 capsules (600 mg total) by mouth 4 (four) times daily.  Marland Kitchen lenalidomide (REVLIMID) 15 MG capsule TAKE 1 CAPSULE (15MG) BY MOUTH DAILY FOR 14 DAYS ON, 7 DAYS OFF REPEAT EVERY 21 DAYS  . LORazepam (ATIVAN) 0.5 MG tablet Take 1 tablet (0.5 mg total) by mouth every 6 (six) hours as needed (Nausea or vomiting).  . Melatonin 5 MG TABS Take 5 mg by mouth at bedtime.  . Multiple Vitamins-Minerals (MULTIVITAMIN WITH MINERALS) tablet Take 1 tablet by mouth daily.  Marland Kitchen omeprazole (PRILOSEC) 20 MG capsule Take 2 capsules (40 mg total) by mouth daily. (Patient taking differently: Take 20 mg by mouth daily. )  . ondansetron (ZOFRAN) 8 MG tablet Take 1 tablet (8 mg total) by mouth 2 (two) times  daily as needed (Nausea or vomiting).  Marland Kitchen oxyCODONE-acetaminophen (PERCOCET) 10-325 MG tablet Take 1 tablet by mouth 5 (five) times daily as needed for pain.   Marland Kitchen prochlorperazine (COMPAZINE) 10 MG tablet Take 1 tablet (10 mg total) by mouth every 6 (six) hours as needed (Nausea or vomiting).  . temazepam (RESTORIL) 30 MG capsule Take 1 capsule (30 mg total) by mouth Nightly.   No facility-administered encounter medications on file as of 12/22/2017.     Allergies as of 12/22/2017 - Review Complete 12/22/2017  Allergen Reaction Noted  . Ace inhibitors Cough 03/04/2013  . Iohexol   06/11/2005  . Venlafaxine Other (See Comments) 04/24/2008  . Tape Rash 05/11/2011    Past Medical History:  Diagnosis Date  . Anemia    2016 on iron for a while   . Anxiety   . ARTHRITIS 10/25/2007  . Arthritis   . Blood transfusion without reported diagnosis   . BREAST CANCER, HX OF 08/03/2006  . Cancer ([place])    hx of breast ca x 2 on right   . DEPRESSION 08/03/2006  . Diabetes ([facility])    prediabetic   . DIABETES MELLITUS, TYPE II 08/03/2006  . DYSPNEA ON EXERTION 11/21/2009   shortness of breath with exertion   . EPISTAXIS, RECURRENT 01/27/2008  . FIBROMYALGIA 01/13/2008  . GERD 04/30/2008  . HEPATITIS C 08/03/2006  . HIP PAIN, RIGHT 04/30/2007  . History of detached retina repair 02/12/2011  . HYPERTENSION 04/30/2008  . INSOMNIA-SLEEP DISORDER-UNSPEC 11/21/2009  . KNEE SPRAIN, ACUTE 01/27/2008  . LEG PAIN, LEFT 01/13/2008  . OSTEOPENIA 07/26/2007  . Persistent vomiting 08/17/2008  . Pneumonia    hx of   . Prediabetes   . Thyroid nodule     Past Surgical History:  Procedure Laterality Date  . ABDOMINAL HYSTERECTOMY    . BREAST LUMPECTOMY  2000   right with axillary LND  . CHOLECYSTECTOMY  06/05/2012   Procedure: LAPAROSCOPIC CHOLECYSTECTOMY;  Surgeon: Adin Hector, MD;  Location: [facility] OR[facility];  Service: General;  Laterality: N/A;  . LAPAROSCOPIC LYSIS OF ADHESIONS  06/05/2012   Procedure: LAPAROSCOPIC LYSIS OF ADHESIONS;  Surgeon: Adin Hector, MD;  Location: [facility] ORS;  Service: General;  Laterality: N/A;  . MASTECTOMY  2007   Dr. Margot Chimes  . s/p right broken leg with surgury as teen    . s/p thyroid FNA neg.    . TONSILLECTOMY    . TOTAL HIP ARTHROPLASTY Right 01/25/2016   Procedure: RIGHT TOTAL HIP ARTHROPLASTY ANTERIOR APPROACH;  Surgeon: Mcarthur Rossetti, MD;  Location: [facility];  Service: Orthopedics;  Laterality: Right;  . TUBAL LIGATION      Family History  Problem Relation Age of Onset  . Heart disease Mother   . Cancer Sister        breast  . Depression Father   .  Depression Brother     Social History   Socioeconomic History  . Marital status: Divorced    Spouse name: Not on file  . Number of children: Not on file  . Years of education: Not on file  . Highest education level: Not on file  Occupational History  . Occupation: Sports coach  Social Needs  . Financial resource strain: Not on file  . Food insecurity:    Worry: Not on file    Inability: Not on file  . Transportation needs:    Medical: Not on file    Non-medical: Not on file  Tobacco Use  .  Smoking status: Former Smoker    Packs/day: 4.00    Years: 40.00    Pack years: 160.00    Types: Cigarettes    Last attempt to quit: 06/30/2008    Years since quitting: 9.4  . Smokeless tobacco: Never Used  . Tobacco comment: Pt smoked up to 4 ppd  Substance and Sexual Activity  . Alcohol use: Yes    Alcohol/week: 0.0 oz    Comment: Once a year  . Drug use: No  . Sexual activity: Not Currently  Lifestyle  . Physical activity:    Days per week: Not on file    Minutes per session: Not on file  . Stress: Not on file  Relationships  . Social connections:    Talks on phone: Not on file    Gets together: Not on file    Attends religious service: Not on file    Active member of club or organization: Not on file    Attends meetings of clubs or organizations: Not on file    Relationship status: Not on file  . Intimate partner violence:    Fear of current or ex partner: Not on file    Emotionally abused: Not on file    Physically abused: Not on file    Forced sexual activity: Not on file  Other Topics Concern  . Not on file  Social History Narrative  . Not on file    Review of systems: Review of Systems  Constitutional: Negative for fever and chills.  HENT: Negative.   Eyes: Negative for blurred vision.  Respiratory: as per HPI  Cardiovascular: Negative for chest pain and palpitations.  Gastrointestinal: Negative for vomiting, diarrhea, blood per rectum. Genitourinary:  Negative for dysuria, urgency, frequency and hematuria.  Musculoskeletal: Negative for myalgias, back pain and joint pain.  Skin: Negative for itching and rash.  Neurological: Negative for dizziness, tremors, focal weakness, seizures and loss of consciousness.  Endo/Heme/Allergies: Negative for environmental allergies.  Psychiatric/Behavioral: Negative for depression, suicidal ideas and hallucinations.  All other systems reviewed and are negative.  Physical Exam: Blood pressure 124/74, pulse 94, height 5' (1.524 m), weight 176 lb 6.4 oz (80 kg), SpO2 97 %. Gen:      No acute distress HEENT:  EOMI, sclera anicteric Neck:     No masses; no thyromegaly Lungs:    Clear to auscultation bilaterally; normal respiratory effort CV:         Regular rate and rhythm; no murmurs Abd:      + bowel sounds; soft, non-tender; no palpable masses, no distension Ext:    No edema; adequate peripheral perfusion Skin:      Warm and dry; no rash Neuro: alert and oriented x 3 Psych: normal mood and affect  Data Reviewed: [facility] 12/22/2017-17  CT angio 11/24/2017- no acute pulmonary embolism, mild cardiomegaly, large hiatal hernia.  Stable 3 mm left upper lobe nodule. PET scan 3/87/5643- no hypermetabolic lesion.  Large nodule in the left thyroid.  3 mm left upper lobe lung nodule.  Left circumflex and LAD coronary artery calcification.  Large hiatal hernia. I have reviewed the images personally.  Chest x-ray from [facility] 12/21/2017 Acute right sixth and seventh rib fractures. No discernible pneumothorax. Large hiatal hernia.  Echocardiogram 11/26/2017-mild LVH, LVEF 32-95%, grade 1 diastolic dysfunction PA peak pressure 32 mm.  Spirometry 08/24/14 FVC 2.51 [112%], FEV1 2.00 [107%], F/F 80 Normal spirometry  PFTs 01/24/2015 FVC 2.83 [100%], FEV1 2.06 [96%], F/F 73, TLC 73%, DLCO 86%  Normal study.  Assessment:  Assessment for dyspnea Symptoms have acute onset associated with right chest pain.  Chest  x-ray done yesterday at [facility] reviewed showing acute right sixth and seventh rib fractures.  Lungs are clear as per report  Suspect that her dyspnea is secondary to pain and splinting from the rib fractures.  Advised conservative management with over-the-counter pain control, warm compresses, over-the-counter lidocaine PFTs from 2016 reviewed with no significant obstruction in spite of significant smoking history.  There is however flattening of expiratory loop.  I would like to repeat PFTs for re evaluation but will defer for now as she cannot give good effort due to rib pain.  Plan/Recommendations: - Conservative management for rib fracture.  Over-the-counter pain medication, topical treatment with lidocaine patches - Follow-up in 1 to 2 months.  Marshell Garfinkel MD [facility] 12/22/2017, 10:46 AM  CC: Deland Pretty, MD

## 2017-12-22 NOTE — Telephone Encounter (Signed)
Immunizations have been updated within pt's chart.  Nothing further is needed.

## 2017-12-23 ENCOUNTER — Inpatient Hospital Stay: Payer: PPO

## 2017-12-23 ENCOUNTER — Inpatient Hospital Stay (HOSPITAL_BASED_OUTPATIENT_CLINIC_OR_DEPARTMENT_OTHER): Payer: PPO | Admitting: Oncology

## 2017-12-23 ENCOUNTER — Telehealth: Payer: Self-pay

## 2017-12-23 VITALS — BP 122/66 | HR 102 | Temp 98.6°F | Resp 18 | Ht 60.0 in | Wt 177.2 lb

## 2017-12-23 DIAGNOSIS — D649 Anemia, unspecified: Secondary | ICD-10-CM | POA: Diagnosis not present

## 2017-12-23 DIAGNOSIS — C9000 Multiple myeloma not having achieved remission: Secondary | ICD-10-CM | POA: Diagnosis not present

## 2017-12-23 DIAGNOSIS — F329 Major depressive disorder, single episode, unspecified: Secondary | ICD-10-CM | POA: Diagnosis not present

## 2017-12-23 DIAGNOSIS — Z5111 Encounter for antineoplastic chemotherapy: Secondary | ICD-10-CM | POA: Diagnosis not present

## 2017-12-23 DIAGNOSIS — Z79899 Other long term (current) drug therapy: Secondary | ICD-10-CM | POA: Diagnosis not present

## 2017-12-23 DIAGNOSIS — E041 Nontoxic single thyroid nodule: Secondary | ICD-10-CM | POA: Diagnosis not present

## 2017-12-23 DIAGNOSIS — D696 Thrombocytopenia, unspecified: Secondary | ICD-10-CM

## 2017-12-23 LAB — CBC WITH DIFFERENTIAL (CANCER CENTER ONLY)
BASOS ABS: 0 10*3/uL (ref 0.0–0.1)
Basophils Relative: 1 %
EOS PCT: 2 %
Eosinophils Absolute: 0.1 10*3/uL (ref 0.0–0.5)
HEMATOCRIT: 31.4 % — AB (ref 34.8–46.6)
Hemoglobin: 10.1 g/dL — ABNORMAL LOW (ref 11.6–15.9)
LYMPHS PCT: 35 %
Lymphs Abs: 1.5 10*3/uL (ref 0.9–3.3)
MCH: 29.5 pg (ref 25.1–34.0)
MCHC: 32.3 g/dL (ref 31.5–36.0)
MCV: 91.2 fL (ref 79.5–101.0)
MONO ABS: 0.8 10*3/uL (ref 0.1–0.9)
Monocytes Relative: 19 %
NEUTROS ABS: 1.8 10*3/uL (ref 1.5–6.5)
Neutrophils Relative %: 43 %
PLATELETS: 303 10*3/uL (ref 145–400)
RBC: 3.44 MIL/uL — AB (ref 3.70–5.45)
RDW: 18.6 % — AB (ref 11.2–14.5)
WBC: 4.3 10*3/uL (ref 3.9–10.3)

## 2017-12-23 LAB — CMP (CANCER CENTER ONLY)
ALK PHOS: 76 U/L (ref 38–126)
ALT: 31 U/L (ref 0–44)
AST: 34 U/L (ref 15–41)
Albumin: 3.3 g/dL — ABNORMAL LOW (ref 3.5–5.0)
Anion gap: 9 (ref 5–15)
BILIRUBIN TOTAL: 0.4 mg/dL (ref 0.3–1.2)
BUN: 13 mg/dL (ref 8–23)
CO2: 26 mmol/L (ref 22–32)
CREATININE: 0.74 mg/dL (ref 0.44–1.00)
Calcium: 8.3 mg/dL — ABNORMAL LOW (ref 8.9–10.3)
Chloride: 106 mmol/L (ref 98–111)
GFR, Est AFR Am: >60 mL/min (ref >60)
GLUCOSE: 101 mg/dL — AB (ref 70–99)
Potassium: 4.3 mmol/L (ref 3.5–5.1)
SODIUM: 141 mmol/L (ref 135–145)
TOTAL PROTEIN: 6.3 g/dL — AB (ref 6.5–8.1)

## 2017-12-23 NOTE — Telephone Encounter (Signed)
Printed avs and calender of upcoming appointment. Per 6/26 los. Patient was okay with coming the next day verses coming 2 days for a (2 hr. Inf.)

## 2017-12-23 NOTE — Progress Notes (Signed)
[facility] OFFICE PROGRESS NOTE   Diagnosis: Multiple myeloma  INTERVAL HISTORY:   Ms. Ramdass returns as scheduled.  She began cycle 3 RVD on 12/04/2017.  She denies neuropathy symptoms.  Velcade was held on 12/15/2017 secondary to thrombocytopenia. She developed pain at the right anterolateral chest wall beginning 12/17/2017.  She saw Dr. Stacie Glaze at [facility] 12/21/2017.  A plain x-ray revealed acute right sixth and seventh rib fractures.  She continues to have discomfort at the right chest. Dr. Stacie Glaze recommends completing 2 more cycles of RVD with weekly Velcade dosing.  Stem cell therapy will then be considered.  Objective:  Vital signs in last 24 hours:  Blood pressure 122/66, pulse (!) 102, temperature 98.6 F (37 C), temperature source Oral, resp. rate 18, height 5' (1.524 m), weight 177 lb 3.2 oz (80.4 kg), SpO2 98 %.    HEENT: No thrush Resp: End inspiratory rales at the left posterior base, no respiratory distress Cardio: Regular rate and rhythm GI: No hepatosplenomegaly Vascular: No leg edema  Skin: Superficial peeling of the skin at the upper thigh bilaterally, no rash    Lab Results:  Lab Results  Component Value Date   WBC 4.3 12/23/2017   HGB 10.1 (L) 12/23/2017   HCT 31.4 (L) 12/23/2017   MCV 91.2 12/23/2017   PLT 303 12/23/2017   NEUTROABS 1.8 12/23/2017    CMP  Lab Results  Component Value Date   NA 141 12/23/2017   K 4.3 12/23/2017   CL 106 12/23/2017   CO2 26 12/23/2017   GLUCOSE 101 (H) 12/23/2017   BUN 13 12/23/2017   CREATININE 0.74 12/23/2017   CALCIUM 8.3 (L) 12/23/2017   PROT 6.3 (L) 12/23/2017   ALBUMIN 3.3 (L) 12/23/2017   AST 34 12/23/2017   ALT 31 12/23/2017   ALKPHOS 76 12/23/2017   BILITOT 0.4 12/23/2017   GFRNONAA >60 12/23/2017   GFRAA >60 12/23/2017     Medications: I have reviewed the patient's current medications.   Assessment/Plan: 1.  Multiple myeloma, IgA kappa diagnosed on bone marrow biopsy  09/22/2017  57% plasma cells on initial bone marrow biopsy, kappa light chain restricted  Normal cytogenetics, FISH with -4, -14, and 13q-abnormalities  PET scan 10/16/2017-negative for bone lesions  Cycle 1 RVD 10/23/2017  Cycle 2 RVD 11/13/2017  Cycle 3 RVD 12/04/2017 (Revlimid dose reduced to 15 mg)  2.  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  Depression  4.  Remote history of breast cancer  5.  Left thyroid solid nodule on PET scan 10/16/2017  Thyroid ultrasound 12/17/2017- solid left thyroid mass measuring 5 cm, biopsy recommended.  2.4 cm solid right thyroid mass, follow-up ultrasound recommended  6.  Right rib fractures 12/21/2017   Disposition: Ms. Corella has completed 3 cycles of RVD.  The plan is to initiate cycle 4 on 12/29/2017.  I will contact Dr. Stacie Glaze to discuss the treatment schedule with cycle 4.  We discussed a preliminary plan to proceed with weekly Velcade dosing and a 21-day cycle of Revlimid at the 15 mg dose.  She will continue weekly Decadron.  Ms. Gavidia will be scheduled for an office visit with week 3 Velcade.  We will follow-up on the serum free light chains from today.  She will receive Zometa when she is here on 12/29/2017.  I will refer her to interventional radiology to consider biopsy of the left thyroid mass.  25 minutes were spent with the patient today.  The  majority of the time was used for counseling and coordination of care.  Betsy Coder, MD  12/23/2017  12:57 PM

## 2017-12-23 NOTE — Addendum Note (Signed)
Addended by: Jethro Bolus A on: 12/23/2017 02:42 PM   Modules accepted: Orders

## 2017-12-24 ENCOUNTER — Other Ambulatory Visit: Payer: Self-pay | Admitting: *Deleted

## 2017-12-24 DIAGNOSIS — E041 Nontoxic single thyroid nodule: Secondary | ICD-10-CM

## 2017-12-24 LAB — KAPPA/LAMBDA LIGHT CHAINS
Kappa free light chain: 293.5 mg/L — ABNORMAL HIGH (ref 3.3–19.4)
Kappa, lambda light chain ratio: 40.76 — ABNORMAL HIGH (ref 0.26–1.65)
Lambda free light chains: 7.2 mg/L (ref 5.7–26.3)

## 2017-12-25 ENCOUNTER — Other Ambulatory Visit: Payer: Self-pay | Admitting: *Deleted

## 2017-12-25 ENCOUNTER — Ambulatory Visit: Payer: Self-pay

## 2017-12-25 DIAGNOSIS — C9000 Multiple myeloma not having achieved remission: Secondary | ICD-10-CM

## 2017-12-25 MED ORDER — LENALIDOMIDE 15 MG PO CAPS: ORAL_CAPSULE | ORAL | 0 refills | Status: DC

## 2017-12-28 ENCOUNTER — Ambulatory Visit: Payer: Self-pay

## 2017-12-28 ENCOUNTER — Telehealth: Payer: Self-pay | Admitting: Emergency Medicine

## 2017-12-28 ENCOUNTER — Other Ambulatory Visit: Payer: Self-pay

## 2017-12-28 NOTE — Telephone Encounter (Addendum)
PT verbalized understanding of this note. Pt also given appt dates and times for tomrrow 7/2. Pt voiced understanding.  ----- Message from Ladell Pier, MD sent at 12/25/2017  4:25 PM EDT ----- Please call patient, light chains are stable, f/u as scheduled for next cycle of RVD

## 2017-12-29 ENCOUNTER — Inpatient Hospital Stay: Payer: PPO

## 2017-12-29 ENCOUNTER — Inpatient Hospital Stay: Payer: PPO | Attending: Hematology and Oncology

## 2017-12-29 ENCOUNTER — Other Ambulatory Visit: Payer: Self-pay | Admitting: Oncology

## 2017-12-29 ENCOUNTER — Telehealth: Payer: Self-pay

## 2017-12-29 VITALS — BP 144/82 | HR 98 | Temp 98.3°F | Resp 16

## 2017-12-29 DIAGNOSIS — D649 Anemia, unspecified: Secondary | ICD-10-CM | POA: Insufficient documentation

## 2017-12-29 DIAGNOSIS — F329 Major depressive disorder, single episode, unspecified: Secondary | ICD-10-CM | POA: Diagnosis not present

## 2017-12-29 DIAGNOSIS — C9000 Multiple myeloma not having achieved remission: Secondary | ICD-10-CM | POA: Diagnosis not present

## 2017-12-29 DIAGNOSIS — Z5111 Encounter for antineoplastic chemotherapy: Secondary | ICD-10-CM | POA: Diagnosis not present

## 2017-12-29 DIAGNOSIS — E041 Nontoxic single thyroid nodule: Secondary | ICD-10-CM | POA: Insufficient documentation

## 2017-12-29 DIAGNOSIS — Z79899 Other long term (current) drug therapy: Secondary | ICD-10-CM | POA: Diagnosis not present

## 2017-12-29 DIAGNOSIS — Z853 Personal history of malignant neoplasm of breast: Secondary | ICD-10-CM | POA: Diagnosis not present

## 2017-12-29 LAB — CBC WITH DIFFERENTIAL (CANCER CENTER ONLY)
BASOS ABS: 0 10*3/uL (ref 0.0–0.1)
BASOS PCT: 0 %
Eosinophils Absolute: 0 10*3/uL (ref 0.0–0.5)
Eosinophils Relative: 0 %
HEMATOCRIT: 31.8 % — AB (ref 34.8–46.6)
HEMOGLOBIN: 10.3 g/dL — AB (ref 11.6–15.9)
Lymphocytes Relative: 14 %
Lymphs Abs: 1.1 10*3/uL (ref 0.9–3.3)
MCH: 29.3 pg (ref 25.1–34.0)
MCHC: 32.4 g/dL (ref 31.5–36.0)
MCV: 90.3 fL (ref 79.5–101.0)
MONO ABS: 0.1 10*3/uL (ref 0.1–0.9)
Monocytes Relative: 2 %
NEUTROS PCT: 84 %
Neutro Abs: 6.9 10*3/uL — ABNORMAL HIGH (ref 1.5–6.5)
Platelet Count: 366 10*3/uL (ref 145–400)
RBC: 3.52 MIL/uL — ABNORMAL LOW (ref 3.70–5.45)
RDW: 17.5 % — AB (ref 11.2–14.5)
WBC Count: 8.1 10*3/uL (ref 3.9–10.3)

## 2017-12-29 LAB — CMP (CANCER CENTER ONLY)
ALBUMIN: 3.8 g/dL (ref 3.5–5.0)
ALT: 26 U/L (ref 0–44)
AST: 25 U/L (ref 15–41)
Alkaline Phosphatase: 83 U/L (ref 38–126)
Anion gap: 11 (ref 5–15)
BILIRUBIN TOTAL: 0.5 mg/dL (ref 0.3–1.2)
BUN: 19 mg/dL (ref 8–23)
CO2: 20 mmol/L — AB (ref 22–32)
Calcium: 9.1 mg/dL (ref 8.9–10.3)
Chloride: 102 mmol/L (ref 98–111)
Creatinine: 0.8 mg/dL (ref 0.44–1.00)
GFR, Est AFR Am: >60 mL/min (ref >60)
GFR, Estimated: >60 mL/min (ref >60)
GLUCOSE: 149 mg/dL — AB (ref 70–99)
Potassium: 4.6 mmol/L (ref 3.5–5.1)
Sodium: 133 mmol/L — ABNORMAL LOW (ref 135–145)
TOTAL PROTEIN: 7 g/dL (ref 6.5–8.1)

## 2017-12-29 MED ORDER — ZOLEDRONIC ACID 4 MG/100ML IV SOLN
4.0000 mg | Freq: Once | INTRAVENOUS | Status: AC
  Administered 2017-12-29: 4 mg via INTRAVENOUS
  Filled 2017-12-29: qty 100

## 2017-12-29 MED ORDER — BORTEZOMIB CHEMO SQ INJECTION 3.5 MG (2.5MG/ML)
1.3000 mg/m2 | Freq: Once | INTRAMUSCULAR | Status: AC
  Administered 2017-12-29: 2.5 mg via SUBCUTANEOUS
  Filled 2017-12-29: qty 1

## 2017-12-29 MED ORDER — SODIUM CHLORIDE 0.9 % IV SOLN
Freq: Once | INTRAVENOUS | Status: AC
  Administered 2017-12-29: 13:00:00 via INTRAVENOUS

## 2017-12-29 MED ORDER — DEXAMETHASONE 4 MG PO TABS: ORAL_TABLET | ORAL | 3 refills | Status: DC

## 2017-12-29 MED ORDER — PROCHLORPERAZINE MALEATE 10 MG PO TABS: 10.0000 mg | ORAL_TABLET | Freq: Once | ORAL | Status: DC

## 2017-12-29 NOTE — Patient Instructions (Signed)
[facility] Discharge Instructions for Patients Receiving Chemotherapy  Today you received the following chemotherapy agents Velcade  To help prevent nausea and vomiting after your treatment, we encourage you to take your nausea medication as directed If you develop nausea and vomiting that is not controlled by your nausea medication, call the clinic.   BELOW ARE SYMPTOMS THAT SHOULD BE REPORTED IMMEDIATELY:  *FEVER GREATER THAN 100.5 F  *CHILLS WITH OR WITHOUT FEVER  NAUSEA AND VOMITING THAT IS NOT CONTROLLED WITH YOUR NAUSEA MEDICATION  *UNUSUAL SHORTNESS OF BREATH  *UNUSUAL BRUISING OR BLEEDING  TENDERNESS IN MOUTH AND THROAT WITH OR WITHOUT PRESENCE OF ULCERS  *URINARY PROBLEMS  *BOWEL PROBLEMS  UNUSUAL RASH Items with * indicate a potential emergency and should be followed up as soon as possible.  Feel free to call the clinic should you have any questions or concerns. The clinic phone number is [phone].  Please show the CHEMO ALERT CARD at check-in to the Emergency Department and triage nurse.   Zoledronic Acid injection (Hypercalcemia, Oncology) What is this medicine? ZOLEDRONIC ACID (ZOE le dron ik AS id) lowers the amount of calcium loss from bone. It is used to treat too much calcium in your blood from cancer. It is also used to prevent complications of cancer that has spread to the bone. This medicine may be used for other purposes; ask your health care provider or pharmacist if you have questions. COMMON BRAND NAME(S): Zometa What should I tell my health care provider before I take this medicine? They need to know if you have any of these conditions: -aspirin-sensitive asthma -cancer, especially if you are receiving medicines used to treat cancer -dental disease or wear dentures -infection -kidney disease -receiving corticosteroids like dexamethasone or prednisone -an unusual or allergic reaction to zoledronic acid, other medicines,  foods, dyes, or preservatives -pregnant or trying to get pregnant -breast-feeding How should I use this medicine? This medicine is for infusion into a vein. It is given by a health care professional in a hospital or clinic setting. Talk to your pediatrician regarding the use of this medicine in children. Special care may be needed. Overdosage: If you think you have taken too much of this medicine contact a poison control center or emergency room at once. NOTE: This medicine is only for you. Do not share this medicine with others. What if I miss a dose? It is important not to miss your dose. Call your doctor or health care professional if you are unable to keep an appointment. What may interact with this medicine? -certain antibiotics given by injection -NSAIDs, medicines for pain and inflammation, like ibuprofen or naproxen -some diuretics like bumetanide, furosemide -teriparatide -thalidomide This list may not describe all possible interactions. Give your health care provider a list of all the medicines, herbs, non-prescription drugs, or dietary supplements you use. Also tell them if you smoke, drink alcohol, or use illegal drugs. Some items may interact with your medicine. What should I watch for while using this medicine? Visit your doctor or health care professional for regular checkups. It may be some time before you see the benefit from this medicine. Do not stop taking your medicine unless your doctor tells you to. Your doctor may order blood tests or other tests to see how you are doing. Women should inform their doctor if they wish to become pregnant or think they might be pregnant. There is a potential for serious side effects to an unborn child. Talk to your   health care professional or pharmacist for more information. You should make sure that you get enough calcium and vitamin D while you are taking this medicine. Discuss the foods you eat and the vitamins you take with your health  care professional. Some people who take this medicine have severe bone, joint, and/or muscle pain. This medicine may also increase your risk for jaw problems or a broken thigh bone. Tell your doctor right away if you have severe pain in your jaw, bones, joints, or muscles. Tell your doctor if you have any pain that does not go away or that gets worse. Tell your dentist and dental surgeon that you are taking this medicine. You should not have major dental surgery while on this medicine. See your dentist to have a dental exam and fix any dental problems before starting this medicine. Take good care of your teeth while on this medicine. Make sure you see your dentist for regular follow-up appointments. What side effects may I notice from receiving this medicine? Side effects that you should report to your doctor or health care professional as soon as possible: -allergic reactions like skin rash, itching or hives, swelling of the face, lips, or tongue -anxiety, confusion, or depression -breathing problems -changes in vision -eye pain -feeling faint or lightheaded, falls -jaw pain, especially after dental work -mouth sores -muscle cramps, stiffness, or weakness -redness, blistering, peeling or loosening of the skin, including inside the mouth -trouble passing urine or change in the amount of urine Side effects that usually do not require medical attention (report to your doctor or health care professional if they continue or are bothersome): -bone, joint, or muscle pain -constipation -diarrhea -fever -hair loss -irritation at site where injected -loss of appetite -nausea, vomiting -stomach upset -trouble sleeping -trouble swallowing -weak or tired This list may not describe all possible side effects. Call your doctor for medical advice about side effects. You may report side effects to FDA at 1-800-FDA-1088. Where should I keep my medicine? This drug is given in a hospital or clinic and  will not be stored at home. NOTE: This sheet is a summary. It may not cover all possible information. If you have questions about this medicine, talk to your doctor, pharmacist, or health care provider.  2018 Elsevier/Gold Standard (2013-11-12 14:19:39)    

## 2017-12-29 NOTE — Telephone Encounter (Addendum)
Pt called to confirm decadron dosage. Per Dr. Benay Spice, pt to take 40mg  weekly so she can take 20mg  the day before and 20mg  the day of chemo. Prescription will be adjusted and sent in to pharmacy

## 2017-12-30 ENCOUNTER — Other Ambulatory Visit: Payer: Self-pay

## 2018-01-01 ENCOUNTER — Ambulatory Visit: Payer: Self-pay

## 2018-01-04 ENCOUNTER — Ambulatory Visit: Payer: Self-pay

## 2018-01-05 ENCOUNTER — Ambulatory Visit: Payer: Self-pay

## 2018-01-05 ENCOUNTER — Other Ambulatory Visit: Payer: Self-pay

## 2018-01-06 ENCOUNTER — Inpatient Hospital Stay: Payer: PPO

## 2018-01-06 ENCOUNTER — Telehealth: Payer: Self-pay | Admitting: *Deleted

## 2018-01-06 ENCOUNTER — Other Ambulatory Visit: Payer: Self-pay | Admitting: Oncology

## 2018-01-06 VITALS — BP 145/71 | HR 105 | Temp 98.7°F | Resp 17 | Ht 60.0 in | Wt 172.5 lb

## 2018-01-06 DIAGNOSIS — C9000 Multiple myeloma not having achieved remission: Secondary | ICD-10-CM

## 2018-01-06 DIAGNOSIS — Z5111 Encounter for antineoplastic chemotherapy: Secondary | ICD-10-CM | POA: Diagnosis not present

## 2018-01-06 DIAGNOSIS — F329 Major depressive disorder, single episode, unspecified: Secondary | ICD-10-CM | POA: Diagnosis not present

## 2018-01-06 DIAGNOSIS — M47817 Spondylosis without myelopathy or radiculopathy, lumbosacral region: Secondary | ICD-10-CM | POA: Diagnosis not present

## 2018-01-06 DIAGNOSIS — G894 Chronic pain syndrome: Secondary | ICD-10-CM | POA: Diagnosis not present

## 2018-01-06 DIAGNOSIS — M4807 Spinal stenosis, lumbosacral region: Secondary | ICD-10-CM | POA: Diagnosis not present

## 2018-01-06 LAB — CBC WITH DIFFERENTIAL (CANCER CENTER ONLY)
BASOS ABS: 0 10*3/uL (ref 0.0–0.1)
Basophils Relative: 0 %
Eosinophils Absolute: 0 10*3/uL (ref 0.0–0.5)
Eosinophils Relative: 0 %
HEMATOCRIT: 34.1 % — AB (ref 34.8–46.6)
Hemoglobin: 11.2 g/dL — ABNORMAL LOW (ref 11.6–15.9)
LYMPHS ABS: 1.4 10*3/uL (ref 0.9–3.3)
LYMPHS PCT: 16 %
MCH: 28.7 pg (ref 25.1–34.0)
MCHC: 32.8 g/dL (ref 31.5–36.0)
MCV: 87.7 fL (ref 79.5–101.0)
Monocytes Absolute: 0.3 10*3/uL (ref 0.1–0.9)
Monocytes Relative: 4 %
NEUTROS ABS: 7.5 10*3/uL — AB (ref 1.5–6.5)
Neutrophils Relative %: 80 %
Platelet Count: 201 10*3/uL (ref 145–400)
RBC: 3.89 MIL/uL (ref 3.70–5.45)
RDW: 17.4 % — ABNORMAL HIGH (ref 11.2–14.5)
WBC Count: 9.4 10*3/uL (ref 3.9–10.3)

## 2018-01-06 MED ORDER — PROCHLORPERAZINE MALEATE 10 MG PO TABS: 10.0000 mg | ORAL_TABLET | Freq: Once | ORAL | Status: DC

## 2018-01-06 MED ORDER — BORTEZOMIB CHEMO SQ INJECTION 3.5 MG (2.5MG/ML)
1.3000 mg/m2 | Freq: Once | INTRAMUSCULAR | Status: AC
  Administered 2018-01-06: 2.5 mg via SUBCUTANEOUS
  Filled 2018-01-06: qty 1

## 2018-01-06 NOTE — Telephone Encounter (Signed)
Ok to treat today with just CBC today per Dr. Benay Spice

## 2018-01-06 NOTE — Patient Instructions (Signed)
[facility] Discharge Instructions for Patients Receiving Chemotherapy  Today you received the following chemotherapy agents: Bortezomib (Velcade)  To help prevent nausea and vomiting after your treatment, we encourage you to take your nausea medication  as prescribed.    If you develop nausea and vomiting that is not controlled by your nausea medication, call the clinic.   BELOW ARE SYMPTOMS THAT SHOULD BE REPORTED IMMEDIATELY:  *FEVER GREATER THAN 100.5 F  *CHILLS WITH OR WITHOUT FEVER  NAUSEA AND VOMITING THAT IS NOT CONTROLLED WITH YOUR NAUSEA MEDICATION  *UNUSUAL SHORTNESS OF BREATH  *UNUSUAL BRUISING OR BLEEDING  TENDERNESS IN MOUTH AND THROAT WITH OR WITHOUT PRESENCE OF ULCERS  *URINARY PROBLEMS  *BOWEL PROBLEMS  UNUSUAL RASH Items with * indicate a potential emergency and should be followed up as soon as possible.  Feel free to call the clinic should you have any questions or concerns. The clinic phone number is [phone].  Please show the CHEMO ALERT CARD at check-in to the Emergency Department and triage nurse.   

## 2018-01-06 NOTE — Patient Instructions (Signed)
[facility] Discharge Instructions for Patients Receiving Chemotherapy  Today you received the following chemotherapy agents: Bortezomib (Velcade)  To help prevent nausea and vomiting after your treatment, we encourage you to take your nausea medication  as prescribed.    If you develop nausea and vomiting that is not controlled by your nausea medication, call the clinic.   BELOW ARE SYMPTOMS THAT SHOULD BE REPORTED IMMEDIATELY:  *FEVER GREATER THAN 100.5 F  *CHILLS WITH OR WITHOUT FEVER  NAUSEA AND VOMITING THAT IS NOT CONTROLLED WITH YOUR NAUSEA MEDICATION  *UNUSUAL SHORTNESS OF BREATH  *UNUSUAL BRUISING OR BLEEDING  TENDERNESS IN MOUTH AND THROAT WITH OR WITHOUT PRESENCE OF ULCERS  *URINARY PROBLEMS  *BOWEL PROBLEMS  UNUSUAL RASH Items with * indicate a potential emergency and should be followed up as soon as possible.  Feel free to call the clinic should you have any questions or concerns. The clinic phone number is [phone].  Please show the CHEMO ALERT CARD at check-in to the Emergency Department and triage nurse.   

## 2018-01-08 ENCOUNTER — Other Ambulatory Visit: Payer: Self-pay | Admitting: Oncology

## 2018-01-08 DIAGNOSIS — C9000 Multiple myeloma not having achieved remission: Secondary | ICD-10-CM

## 2018-01-13 ENCOUNTER — Encounter: Payer: Self-pay | Admitting: Nurse Practitioner

## 2018-01-13 ENCOUNTER — Inpatient Hospital Stay (HOSPITAL_BASED_OUTPATIENT_CLINIC_OR_DEPARTMENT_OTHER): Payer: PPO | Admitting: Nurse Practitioner

## 2018-01-13 ENCOUNTER — Telehealth: Payer: Self-pay | Admitting: Nurse Practitioner

## 2018-01-13 ENCOUNTER — Inpatient Hospital Stay: Payer: PPO

## 2018-01-13 VITALS — BP 128/78 | HR 111 | Temp 98.6°F | Resp 18 | Ht 60.0 in | Wt 174.8 lb

## 2018-01-13 DIAGNOSIS — D649 Anemia, unspecified: Secondary | ICD-10-CM | POA: Diagnosis not present

## 2018-01-13 DIAGNOSIS — F329 Major depressive disorder, single episode, unspecified: Secondary | ICD-10-CM | POA: Diagnosis not present

## 2018-01-13 DIAGNOSIS — E041 Nontoxic single thyroid nodule: Secondary | ICD-10-CM

## 2018-01-13 DIAGNOSIS — Z79899 Other long term (current) drug therapy: Secondary | ICD-10-CM | POA: Diagnosis not present

## 2018-01-13 DIAGNOSIS — Z5111 Encounter for antineoplastic chemotherapy: Secondary | ICD-10-CM | POA: Diagnosis not present

## 2018-01-13 DIAGNOSIS — Z853 Personal history of malignant neoplasm of breast: Secondary | ICD-10-CM

## 2018-01-13 DIAGNOSIS — C9000 Multiple myeloma not having achieved remission: Secondary | ICD-10-CM

## 2018-01-13 LAB — CBC WITH DIFFERENTIAL (CANCER CENTER ONLY)
Basophils Absolute: 0 10*3/uL (ref 0.0–0.1)
Basophils Relative: 1 %
Eosinophils Absolute: 0.1 10*3/uL (ref 0.0–0.5)
Eosinophils Relative: 2 %
HEMATOCRIT: 35.1 % (ref 34.8–46.6)
HEMOGLOBIN: 11 g/dL — AB (ref 11.6–15.9)
LYMPHS PCT: 34 %
Lymphs Abs: 1.3 10*3/uL (ref 0.9–3.3)
MCH: 28.2 pg (ref 25.1–34.0)
MCHC: 31.4 g/dL — ABNORMAL LOW (ref 31.5–36.0)
MCV: 89.9 fL (ref 79.5–101.0)
MONO ABS: 0.5 10*3/uL (ref 0.1–0.9)
Monocytes Relative: 13 %
NEUTROS ABS: 2 10*3/uL (ref 1.5–6.5)
NEUTROS PCT: 50 %
Platelet Count: 136 10*3/uL — ABNORMAL LOW (ref 145–400)
RBC: 3.91 MIL/uL (ref 3.70–5.45)
RDW: 18 % — AB (ref 11.2–14.5)
WBC Count: 3.9 10*3/uL (ref 3.9–10.3)

## 2018-01-13 MED ORDER — PROCHLORPERAZINE MALEATE 10 MG PO TABS
ORAL_TABLET | ORAL | Status: AC
  Filled 2018-01-13: qty 1

## 2018-01-13 MED ORDER — BORTEZOMIB CHEMO SQ INJECTION 3.5 MG (2.5MG/ML)
1.3000 mg/m2 | Freq: Once | INTRAMUSCULAR | Status: AC
  Administered 2018-01-13: 2.5 mg via SUBCUTANEOUS
  Filled 2018-01-13: qty 1

## 2018-01-13 MED ORDER — PROCHLORPERAZINE MALEATE 10 MG PO TABS: 10.0000 mg | ORAL_TABLET | Freq: Once | ORAL | Status: DC

## 2018-01-13 NOTE — Telephone Encounter (Signed)
Patient scheduled 8/14 due to avail of GBS/LT schedule. Gave patient avs and calendar of upcoming appts.

## 2018-01-13 NOTE — Patient Instructions (Signed)
[facility] Discharge Instructions for Patients Receiving Chemotherapy  Today you received the following chemotherapy agents: Bortezomib (Velcade)  To help prevent nausea and vomiting after your treatment, we encourage you to take your nausea medication  as prescribed.    If you develop nausea and vomiting that is not controlled by your nausea medication, call the clinic.   BELOW ARE SYMPTOMS THAT SHOULD BE REPORTED IMMEDIATELY:  *FEVER GREATER THAN 100.5 F  *CHILLS WITH OR WITHOUT FEVER  NAUSEA AND VOMITING THAT IS NOT CONTROLLED WITH YOUR NAUSEA MEDICATION  *UNUSUAL SHORTNESS OF BREATH  *UNUSUAL BRUISING OR BLEEDING  TENDERNESS IN MOUTH AND THROAT WITH OR WITHOUT PRESENCE OF ULCERS  *URINARY PROBLEMS  *BOWEL PROBLEMS  UNUSUAL RASH Items with * indicate a potential emergency and should be followed up as soon as possible.  Feel free to call the clinic should you have any questions or concerns. The clinic phone number is [phone].  Please show the CHEMO ALERT CARD at check-in to the Emergency Department and triage nurse.   

## 2018-01-13 NOTE — Progress Notes (Signed)
Ok to treat with CMP from 07/02 per Ned Card, NP.

## 2018-01-13 NOTE — Progress Notes (Addendum)
  [place] OFFICE PROGRESS NOTE   Diagnosis: Multiple myeloma  INTERVAL HISTORY:   [name] returns as scheduled.  She completed cycle 4-week 2 RVD 01/06/2018.  She denies nausea/vomiting.  No mouth sores.  No diarrhea.  No neuropathy symptoms.  She denies any dental issues.  She reports appetite is poor.  No weight loss.  She denies pain.  Objective:  Vital signs in last 24 hours:  Blood pressure 128/78, pulse (!) 111, temperature 98.6 F (37 C), temperature source Oral, resp. rate 18, height 5' (1.524 m), weight 174 lb 12.8 oz (79.3 kg), SpO2 98 %.    HEENT: No thrush or ulcers. Resp: Lungs clear bilaterally. Cardio: Regular rate and rhythm. GI: Abdomen soft and nontender.  No hepatospleno megaly. Vascular: No leg edema.  Calves soft and nontender.    Lab Results:  Lab Results  Component Value Date   WBC 3.9 01/13/2018   HGB 11.0 (L) 01/13/2018   HCT 35.1 01/13/2018   MCV 89.9 01/13/2018   PLT 136 (L) 01/13/2018   NEUTROABS 2.0 01/13/2018    Imaging:  No results found.  Medications: I have reviewed the patient's current medications.  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  2.4 cm solid right thyroid mass, follow-up ultrasound recommended  6.  Right rib fractures 12/21/2017    Disposition: Ms. Mccluskey appears  stable.  She is currently completing cycle 4 RVD.  She seems to be tolerating treatment well overall.  Plan to proceed with week 3 Velcade today as scheduled.    She will return for a follow-up CBC, chemistry panel and light chains on 01/21/2018.  Based on the CBC result a decision will be made regarding escalating the Revlimid dose from 15 mg to 25 mg with cycle 5.    She will return for lab, Velcade (cycle 5-day 1) and Zometa on 01/26/2018; week 2 lab and Velcade 02/02/2018.  We will see her in follow-up prior to the week 3 Velcade on 02/09/2018.  She will contact the office in the interim with any problems.  Patient seen with Dr. Benay Spice.    Ned Card ANP/GNP-BC   01/13/2018  11:34 AM This was a shared visit with Ned Card.  Ms Page will complete cycle 4 RVD beginning today.  She would like to escalate the Revlimid dose with cycle 5.  She will return for a CBC on 01/21/2018.  If the white count and platelets are adequate the Revlimid will be escalated to 25 mg beginning with cycle 5.  The plan is to continue monthly Zometa.  She is considering the option of stem cell transplant.  We will discuss this further when she is here on 02/02/2018.  Julieanne Manson, MD

## 2018-01-14 ENCOUNTER — Other Ambulatory Visit (HOSPITAL_COMMUNITY)
Admission: RE | Admit: 2018-01-14 | Discharge: 2018-01-14 | Disposition: A | Discharge status: Home / Self Care | Payer: PPO | Source: Ambulatory Visit | Attending: Student | Admitting: Student

## 2018-01-14 ENCOUNTER — Ambulatory Visit
Admission: RE | Admit: 2018-01-14 | Discharge: 2018-01-14 | Disposition: A | Discharge status: Home / Self Care | Payer: PPO | Source: Ambulatory Visit | Attending: Oncology | Admitting: Oncology

## 2018-01-14 DIAGNOSIS — E079 Disorder of thyroid, unspecified: Secondary | ICD-10-CM | POA: Diagnosis not present

## 2018-01-14 DIAGNOSIS — E041 Nontoxic single thyroid nodule: Secondary | ICD-10-CM

## 2018-01-15 ENCOUNTER — Other Ambulatory Visit: Payer: Self-pay

## 2018-01-15 NOTE — Telephone Encounter (Signed)
Called to inform pt that decadron has refills and is available for pickup at her pharmacy. Pt voiced understanding

## 2018-01-20 DIAGNOSIS — B182 Chronic viral hepatitis C: Secondary | ICD-10-CM | POA: Diagnosis not present

## 2018-01-20 DIAGNOSIS — D649 Anemia, unspecified: Secondary | ICD-10-CM | POA: Diagnosis not present

## 2018-01-20 DIAGNOSIS — E041 Nontoxic single thyroid nodule: Secondary | ICD-10-CM | POA: Diagnosis not present

## 2018-01-21 ENCOUNTER — Telehealth: Payer: Self-pay

## 2018-01-21 ENCOUNTER — Inpatient Hospital Stay: Payer: PPO

## 2018-01-21 DIAGNOSIS — C9000 Multiple myeloma not having achieved remission: Secondary | ICD-10-CM

## 2018-01-21 DIAGNOSIS — Z5111 Encounter for antineoplastic chemotherapy: Secondary | ICD-10-CM | POA: Diagnosis not present

## 2018-01-21 LAB — CBC WITH DIFFERENTIAL (CANCER CENTER ONLY)
BASOS PCT: 1 %
Basophils Absolute: 0 10*3/uL (ref 0.0–0.1)
EOS ABS: 0.1 10*3/uL (ref 0.0–0.5)
Eosinophils Relative: 3 %
HEMATOCRIT: 33.2 % — AB (ref 34.8–46.6)
HEMOGLOBIN: 10.6 g/dL — AB (ref 11.6–15.9)
Lymphocytes Relative: 33 %
Lymphs Abs: 1.3 10*3/uL (ref 0.9–3.3)
MCH: 28.6 pg (ref 25.1–34.0)
MCHC: 31.9 g/dL (ref 31.5–36.0)
MCV: 89.7 fL (ref 79.5–101.0)
Monocytes Absolute: 0.6 10*3/uL (ref 0.1–0.9)
Monocytes Relative: 15 %
NEUTROS ABS: 2 10*3/uL (ref 1.5–6.5)
NEUTROS PCT: 48 %
Platelet Count: 239 10*3/uL (ref 145–400)
RBC: 3.7 MIL/uL (ref 3.70–5.45)
RDW: 18.9 % — ABNORMAL HIGH (ref 11.2–14.5)
WBC: 4.1 10*3/uL (ref 3.9–10.3)

## 2018-01-21 LAB — CMP (CANCER CENTER ONLY)
ALK PHOS: 55 U/L (ref 38–126)
ALT: 41 U/L (ref 0–44)
ANION GAP: 10 (ref 5–15)
AST: 34 U/L (ref 15–41)
Albumin: 3.3 g/dL — ABNORMAL LOW (ref 3.5–5.0)
BUN: 11 mg/dL (ref 8–23)
CALCIUM: 9.1 mg/dL (ref 8.9–10.3)
CHLORIDE: 105 mmol/L (ref 98–111)
CO2: 25 mmol/L (ref 22–32)
CREATININE: 0.73 mg/dL (ref 0.44–1.00)
Glucose, Bld: 86 mg/dL (ref 70–99)
Potassium: 4.4 mmol/L (ref 3.5–5.1)
SODIUM: 140 mmol/L (ref 135–145)
Total Bilirubin: 0.3 mg/dL (ref 0.3–1.2)
Total Protein: 6 g/dL — ABNORMAL LOW (ref 6.5–8.1)

## 2018-01-21 MED ORDER — LENALIDOMIDE 25 MG PO CAPS: ORAL_CAPSULE | ORAL | 0 refills | Status: DC

## 2018-01-21 NOTE — Telephone Encounter (Signed)
Spoke with pt to inform of lab results. Per Dr. Benay Spice, pt to resume Revlimid at 25mg . Refill sent to [facility]. Pt informed. Pt also requests to be called with results of thyroid biopsy. Will consult MD.

## 2018-01-21 NOTE — Telephone Encounter (Signed)
Spoke with Dr. Benay Spice. Per MD, thyroid biopsy showed some atypical cells. The biopsy has been sent out for additional testing to confirm that the cells are benign; this may take a couple of weeks to result. When results are in, our office will call pt. Pt voiced undrstanding

## 2018-01-22 LAB — KAPPA/LAMBDA LIGHT CHAINS
Kappa free light chain: 233.6 mg/L — ABNORMAL HIGH (ref 3.3–19.4)
Kappa, lambda light chain ratio: 38.93 — ABNORMAL HIGH (ref 0.26–1.65)
LAMDA FREE LIGHT CHAINS: 6 mg/L (ref 5.7–26.3)

## 2018-01-22 NOTE — Telephone Encounter (Signed)
Telephone call to patient to advise pathology report on Thyroid nodule is still pending. She appreciated the communication.

## 2018-01-26 ENCOUNTER — Other Ambulatory Visit: Payer: Self-pay | Admitting: Oncology

## 2018-01-26 ENCOUNTER — Inpatient Hospital Stay: Payer: PPO

## 2018-01-26 VITALS — BP 146/74 | HR 96 | Temp 98.7°F | Resp 20

## 2018-01-26 DIAGNOSIS — Z5111 Encounter for antineoplastic chemotherapy: Secondary | ICD-10-CM | POA: Diagnosis not present

## 2018-01-26 DIAGNOSIS — C9000 Multiple myeloma not having achieved remission: Secondary | ICD-10-CM

## 2018-01-26 LAB — CBC WITH DIFFERENTIAL (CANCER CENTER ONLY)
BASOS ABS: 0 10*3/uL (ref 0.0–0.1)
BASOS PCT: 0 %
EOS PCT: 0 %
Eosinophils Absolute: 0 10*3/uL (ref 0.0–0.5)
HEMATOCRIT: 33 % — AB (ref 34.8–46.6)
Hemoglobin: 10.6 g/dL — ABNORMAL LOW (ref 11.6–15.9)
Lymphocytes Relative: 8 %
Lymphs Abs: 0.7 10*3/uL — ABNORMAL LOW (ref 0.9–3.3)
MCH: 29.1 pg (ref 25.1–34.0)
MCHC: 32.1 g/dL (ref 31.5–36.0)
MCV: 90.6 fL (ref 79.5–101.0)
MONO ABS: 0.3 10*3/uL (ref 0.1–0.9)
Monocytes Relative: 3 %
NEUTROS ABS: 7.9 10*3/uL — AB (ref 1.5–6.5)
Neutrophils Relative %: 89 %
PLATELETS: 328 10*3/uL (ref 145–400)
RBC: 3.64 MIL/uL — ABNORMAL LOW (ref 3.70–5.45)
RDW: 19 % — AB (ref 11.2–14.5)
WBC Count: 8.9 10*3/uL (ref 3.9–10.3)

## 2018-01-26 MED ORDER — SODIUM CHLORIDE 0.9 % IV SOLN
INTRAVENOUS | Status: DC
  Administered 2018-01-26: 14:00:00 via INTRAVENOUS
  Filled 2018-01-26: qty 250

## 2018-01-26 MED ORDER — PROCHLORPERAZINE MALEATE 10 MG PO TABS: 10.0000 mg | ORAL_TABLET | Freq: Once | ORAL | Status: DC

## 2018-01-26 MED ORDER — ZOLEDRONIC ACID 4 MG/100ML IV SOLN
4.0000 mg | Freq: Once | INTRAVENOUS | Status: AC
  Administered 2018-01-26: 4 mg via INTRAVENOUS
  Filled 2018-01-26: qty 100

## 2018-01-26 MED ORDER — BORTEZOMIB CHEMO SQ INJECTION 3.5 MG (2.5MG/ML)
1.3000 mg/m2 | Freq: Once | INTRAMUSCULAR | Status: AC
  Administered 2018-01-26: 2.5 mg via SUBCUTANEOUS
  Filled 2018-01-26: qty 1

## 2018-02-02 ENCOUNTER — Inpatient Hospital Stay: Payer: PPO

## 2018-02-02 ENCOUNTER — Inpatient Hospital Stay: Payer: PPO | Attending: Oncology

## 2018-02-02 ENCOUNTER — Encounter (HOSPITAL_COMMUNITY): Payer: Self-pay

## 2018-02-02 VITALS — BP 125/86 | HR 104 | Temp 98.7°F | Resp 18

## 2018-02-02 DIAGNOSIS — Z853 Personal history of malignant neoplasm of breast: Secondary | ICD-10-CM | POA: Insufficient documentation

## 2018-02-02 DIAGNOSIS — E041 Nontoxic single thyroid nodule: Secondary | ICD-10-CM | POA: Insufficient documentation

## 2018-02-02 DIAGNOSIS — Z79899 Other long term (current) drug therapy: Secondary | ICD-10-CM | POA: Diagnosis not present

## 2018-02-02 DIAGNOSIS — F329 Major depressive disorder, single episode, unspecified: Secondary | ICD-10-CM | POA: Insufficient documentation

## 2018-02-02 DIAGNOSIS — D649 Anemia, unspecified: Secondary | ICD-10-CM | POA: Diagnosis not present

## 2018-02-02 DIAGNOSIS — Z5111 Encounter for antineoplastic chemotherapy: Secondary | ICD-10-CM | POA: Diagnosis not present

## 2018-02-02 DIAGNOSIS — C9000 Multiple myeloma not having achieved remission: Secondary | ICD-10-CM

## 2018-02-02 LAB — CBC WITH DIFFERENTIAL (CANCER CENTER ONLY)
Basophils Absolute: 0 10*3/uL (ref 0.0–0.1)
Basophils Relative: 0 %
EOS PCT: 0 %
Eosinophils Absolute: 0 10*3/uL (ref 0.0–0.5)
HEMATOCRIT: 36.6 % (ref 34.8–46.6)
Hemoglobin: 11.7 g/dL (ref 11.6–15.9)
LYMPHS PCT: 9 %
Lymphs Abs: 0.8 10*3/uL — ABNORMAL LOW (ref 0.9–3.3)
MCH: 28.4 pg (ref 25.1–34.0)
MCHC: 31.9 g/dL (ref 31.5–36.0)
MCV: 89.1 fL (ref 79.5–101.0)
Monocytes Absolute: 0.1 10*3/uL (ref 0.1–0.9)
Monocytes Relative: 2 %
Neutro Abs: 7.3 10*3/uL — ABNORMAL HIGH (ref 1.5–6.5)
Neutrophils Relative %: 89 %
PLATELETS: 183 10*3/uL (ref 145–400)
RBC: 4.1 MIL/uL (ref 3.70–5.45)
RDW: 18.3 % — AB (ref 11.2–14.5)
WBC: 8.2 10*3/uL (ref 3.9–10.3)

## 2018-02-02 LAB — CMP (CANCER CENTER ONLY)
ALT: 42 U/L (ref 0–44)
ANION GAP: 16 — AB (ref 5–15)
AST: 22 U/L (ref 15–41)
Albumin: 3.7 g/dL (ref 3.5–5.0)
Alkaline Phosphatase: 65 U/L (ref 38–126)
BILIRUBIN TOTAL: 0.4 mg/dL (ref 0.3–1.2)
BUN: 23 mg/dL (ref 8–23)
CALCIUM: 9.6 mg/dL (ref 8.9–10.3)
CO2: 19 mmol/L — ABNORMAL LOW (ref 22–32)
Chloride: 103 mmol/L (ref 98–111)
Creatinine: 0.82 mg/dL (ref 0.44–1.00)
GFR, Est AFR Am: >60 mL/min (ref >60)
GFR, Estimated: >60 mL/min (ref >60)
Glucose, Bld: 184 mg/dL — ABNORMAL HIGH (ref 70–99)
POTASSIUM: 4.1 mmol/L (ref 3.5–5.1)
Sodium: 138 mmol/L (ref 135–145)
TOTAL PROTEIN: 7.3 g/dL (ref 6.5–8.1)

## 2018-02-02 MED ORDER — BORTEZOMIB CHEMO SQ INJECTION 3.5 MG (2.5MG/ML)
1.3000 mg/m2 | Freq: Once | INTRAMUSCULAR | Status: AC
  Administered 2018-02-02: 2.5 mg via SUBCUTANEOUS
  Filled 2018-02-02: qty 1

## 2018-02-02 MED ORDER — PROCHLORPERAZINE MALEATE 10 MG PO TABS: 10.0000 mg | ORAL_TABLET | Freq: Once | ORAL | Status: DC

## 2018-02-02 NOTE — Patient Instructions (Signed)
[facility] Discharge Instructions for Patients Receiving Chemotherapy  Today you received the following chemotherapy agents: Bortezomib (Velcade)  To help prevent nausea and vomiting after your treatment, we encourage you to take your nausea medication  as prescribed.    If you develop nausea and vomiting that is not controlled by your nausea medication, call the clinic.   BELOW ARE SYMPTOMS THAT SHOULD BE REPORTED IMMEDIATELY:  *FEVER GREATER THAN 100.5 F  *CHILLS WITH OR WITHOUT FEVER  NAUSEA AND VOMITING THAT IS NOT CONTROLLED WITH YOUR NAUSEA MEDICATION  *UNUSUAL SHORTNESS OF BREATH  *UNUSUAL BRUISING OR BLEEDING  TENDERNESS IN MOUTH AND THROAT WITH OR WITHOUT PRESENCE OF ULCERS  *URINARY PROBLEMS  *BOWEL PROBLEMS  UNUSUAL RASH Items with * indicate a potential emergency and should be followed up as soon as possible.  Feel free to call the clinic should you have any questions or concerns. The clinic phone number is [phone].  Please show the CHEMO ALERT CARD at check-in to the Emergency Department and triage nurse.   

## 2018-02-04 ENCOUNTER — Other Ambulatory Visit: Payer: Self-pay | Admitting: *Deleted

## 2018-02-04 NOTE — Patient Outreach (Addendum)
[facility]) Care Management  02/04/2018  [name] 1950/10/16 [id number]   Reached patient via home contact number and completed a high risk assessment using patient's completed [employer] heath risk assessment form.  No needs identified so successful [employer] outreach letter mailed to member along with [facility] Management brochure and 24 hour Nurse Advice Line magnet.  Barrington Ellison RN,CCM,CDE [place] Management Coordinator Office Phone [phone] Office Fax [phone]

## 2018-02-07 ENCOUNTER — Other Ambulatory Visit: Payer: Self-pay | Admitting: Oncology

## 2018-02-07 DIAGNOSIS — C9000 Multiple myeloma not having achieved remission: Secondary | ICD-10-CM

## 2018-02-10 ENCOUNTER — Inpatient Hospital Stay (HOSPITAL_BASED_OUTPATIENT_CLINIC_OR_DEPARTMENT_OTHER): Payer: PPO | Admitting: Oncology

## 2018-02-10 ENCOUNTER — Inpatient Hospital Stay: Payer: PPO

## 2018-02-10 ENCOUNTER — Telehealth: Payer: Self-pay | Admitting: Oncology

## 2018-02-10 VITALS — HR 91

## 2018-02-10 VITALS — BP 152/85 | HR 110 | Temp 99.1°F | Resp 16 | Ht 60.0 in | Wt 181.0 lb

## 2018-02-10 DIAGNOSIS — Z79899 Other long term (current) drug therapy: Secondary | ICD-10-CM

## 2018-02-10 DIAGNOSIS — C9000 Multiple myeloma not having achieved remission: Secondary | ICD-10-CM

## 2018-02-10 DIAGNOSIS — Z5111 Encounter for antineoplastic chemotherapy: Secondary | ICD-10-CM | POA: Diagnosis not present

## 2018-02-10 DIAGNOSIS — Z853 Personal history of malignant neoplasm of breast: Secondary | ICD-10-CM

## 2018-02-10 DIAGNOSIS — D649 Anemia, unspecified: Secondary | ICD-10-CM

## 2018-02-10 DIAGNOSIS — E041 Nontoxic single thyroid nodule: Secondary | ICD-10-CM | POA: Diagnosis not present

## 2018-02-10 LAB — CBC WITH DIFFERENTIAL (CANCER CENTER ONLY)
BASOS PCT: 0 %
Basophils Absolute: 0 10*3/uL (ref 0.0–0.1)
Eosinophils Absolute: 0 10*3/uL (ref 0.0–0.5)
Eosinophils Relative: 0 %
HCT: 35 % (ref 34.8–46.6)
HEMOGLOBIN: 10.7 g/dL — AB (ref 11.6–15.9)
LYMPHS ABS: 1.1 10*3/uL (ref 0.9–3.3)
Lymphocytes Relative: 10 %
MCH: 28.4 pg (ref 25.1–34.0)
MCHC: 30.6 g/dL — AB (ref 31.5–36.0)
MCV: 92.8 fL (ref 79.5–101.0)
MONOS PCT: 9 %
Monocytes Absolute: 1 10*3/uL — ABNORMAL HIGH (ref 0.1–0.9)
NEUTROS ABS: 9.1 10*3/uL — AB (ref 1.5–6.5)
NEUTROS PCT: 81 %
Platelet Count: 190 10*3/uL (ref 145–400)
RBC: 3.77 MIL/uL (ref 3.70–5.45)
RDW: 17.6 % — ABNORMAL HIGH (ref 11.2–14.5)
WBC Count: 11.2 10*3/uL — ABNORMAL HIGH (ref 3.9–10.3)

## 2018-02-10 MED ORDER — PROCHLORPERAZINE MALEATE 10 MG PO TABS: 10.0000 mg | ORAL_TABLET | Freq: Once | ORAL | Status: DC

## 2018-02-10 MED ORDER — BORTEZOMIB CHEMO SQ INJECTION 3.5 MG (2.5MG/ML)
1.3000 mg/m2 | Freq: Once | INTRAMUSCULAR | Status: AC
  Administered 2018-02-10: 2.5 mg via SUBCUTANEOUS
  Filled 2018-02-10: qty 1

## 2018-02-10 NOTE — Telephone Encounter (Signed)
Unable to [facility] due to VM being full.  Letter/Calendr mailed to patient per 8/14 los

## 2018-02-10 NOTE — Progress Notes (Signed)
  [place] OFFICE PROGRESS NOTE   Diagnosis: Multiple myeloma  INTERVAL HISTORY:   [name] returns for a scheduled visit.  She began another cycle of RVD on 01/26/2018.  She reports constipation after receiving Velcade.  No neuropathy symptoms. She is scheduled follow-up at [facility] on 02/22/2018.  Objective:  Vital signs in last 24 hours:  Blood pressure (!) 152/85, pulse (!) 110, temperature 99.1 F (37.3 C), temperature source Oral, resp. rate 16, height 5' (1.524 m), weight 181 lb (82.1 kg), SpO2 99 %.    HEENT: No thrush or ulcers Resp: Clear bilaterally  Cardio: Regular rate and rhythm GI: No hepatosplenomegaly, nontender Vascular: No leg edema, left lower leg is larger than the right side   Lab Results:  Lab Results  Component Value Date   WBC 11.2 (H) 02/10/2018   HGB 10.7 (L) 02/10/2018   HCT 35.0 02/10/2018   MCV 92.8 02/10/2018   PLT 190 02/10/2018   NEUTROABS 9.1 (H) 02/10/2018    CMP  Lab Results  Component Value Date   NA 138 02/02/2018   K 4.1 02/02/2018   CL 103 02/02/2018   CO2 19 (L) 02/02/2018   GLUCOSE 184 (H) 02/02/2018   BUN 23 02/02/2018   CREATININE 0.82 02/02/2018   CALCIUM 9.6 02/02/2018   PROT 7.3 02/02/2018   ALBUMIN 3.7 02/02/2018   AST 22 02/02/2018   ALT 42 02/02/2018   ALKPHOS 65 02/02/2018   BILITOT 0.4 02/02/2018   GFRNONAA >60 02/02/2018   GFRAA >60 02/02/2018   Free kappa light chain 01/21/2018: 233.6 Medications: I have reviewed the patient's current medications.   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 2.4 cm solid right thyroid mass, follow-up ultrasound recommended  Biopsy 01/14/2018-atypia of undetermined significance or follicular lesion of undetermined significance  Affirma testing- likely a benign lesion  6.Right rib fractures 12/21/2017     Disposition: [name] appears stable.  She is concurrently completing cycle 5 RVD.  She will return for restaging labs next week.  [name] is scheduled for follow-up with the transplant team at [facility] on 02/22/2018.  She will return here 02/23/2018 with the plan to begin another cycle of RVD unless she is beginning the stem cell process.  We discussed treatment options including stem cell therapy, continuing RVD, maintenance Revlimid, and switching to different systemic therapy regimens.  We may recommend a restaging bone marrow biopsy within the next 1-2 months.  25 minutes were spent with the patient today.  The majority of the time was used for counseling and coordination of care.  Betsy Coder, MD  02/10/2018  10:31 AM

## 2018-02-10 NOTE — Patient Instructions (Signed)
[facility] Discharge Instructions for Patients Receiving Chemotherapy  Today you received the following chemotherapy agents Velcade To help prevent nausea and vomiting after your treatment, we encourage you to take your nausea medication as prescribed.   If you develop nausea and vomiting that is not controlled by your nausea medication, call the clinic.   BELOW ARE SYMPTOMS THAT SHOULD BE REPORTED IMMEDIATELY:  *FEVER GREATER THAN 100.5 F  *CHILLS WITH OR WITHOUT FEVER  NAUSEA AND VOMITING THAT IS NOT CONTROLLED WITH YOUR NAUSEA MEDICATION  *UNUSUAL SHORTNESS OF BREATH  *UNUSUAL BRUISING OR BLEEDING  TENDERNESS IN MOUTH AND THROAT WITH OR WITHOUT PRESENCE OF ULCERS  *URINARY PROBLEMS  *BOWEL PROBLEMS  UNUSUAL RASH Items with * indicate a potential emergency and should be followed up as soon as possible.  Feel free to call the clinic should you have any questions or concerns. The clinic phone number is [phone].  Please show the CHEMO ALERT CARD at check-in to the Emergency Department and triage nurse.   

## 2018-02-10 NOTE — Progress Notes (Signed)
Per Dr Benay Spice OK for Velcade today with CMP from 02/02/18

## 2018-02-10 NOTE — Telephone Encounter (Signed)
Appts scheduled AVS/Calendar printed per 8/14 los °

## 2018-02-16 ENCOUNTER — Ambulatory Visit (INDEPENDENT_AMBULATORY_CARE_PROVIDER_SITE_OTHER): Payer: PPO | Admitting: Psychiatry

## 2018-02-16 ENCOUNTER — Encounter (HOSPITAL_COMMUNITY): Payer: Self-pay | Admitting: Psychiatry

## 2018-02-16 DIAGNOSIS — F331 Major depressive disorder, recurrent, moderate: Secondary | ICD-10-CM | POA: Diagnosis not present

## 2018-02-16 DIAGNOSIS — F332 Major depressive disorder, recurrent severe without psychotic features: Secondary | ICD-10-CM

## 2018-02-16 DIAGNOSIS — F5104 Psychophysiologic insomnia: Secondary | ICD-10-CM | POA: Diagnosis not present

## 2018-02-16 DIAGNOSIS — F329 Major depressive disorder, single episode, unspecified: Secondary | ICD-10-CM | POA: Diagnosis not present

## 2018-02-16 DIAGNOSIS — G894 Chronic pain syndrome: Secondary | ICD-10-CM | POA: Diagnosis not present

## 2018-02-16 DIAGNOSIS — M4807 Spinal stenosis, lumbosacral region: Secondary | ICD-10-CM | POA: Diagnosis not present

## 2018-02-16 DIAGNOSIS — M47817 Spondylosis without myelopathy or radiculopathy, lumbosacral region: Secondary | ICD-10-CM | POA: Diagnosis not present

## 2018-02-16 MED ORDER — ARIPIPRAZOLE 2 MG PO TABS: 2.0000 mg | ORAL_TABLET | Freq: Every day | ORAL | 3 refills | Status: AC

## 2018-02-16 MED ORDER — AMITRIPTYLINE HCL 25 MG PO TABS: 25.0000 mg | ORAL_TABLET | Freq: Every day | ORAL | 3 refills | Status: AC

## 2018-02-16 MED ORDER — TEMAZEPAM 30 MG PO CAPS: 30.0000 mg | ORAL_CAPSULE | Freq: Every evening | ORAL | 3 refills | Status: AC

## 2018-02-16 MED ORDER — DULOXETINE HCL 30 MG PO CPEP: 30.0000 mg | ORAL_CAPSULE | Freq: Every day | ORAL | 3 refills | Status: AC

## 2018-02-16 MED ORDER — DULOXETINE HCL 60 MG PO CPEP: 60.0000 mg | ORAL_CAPSULE | Freq: Every day | ORAL | 3 refills | Status: DC

## 2018-02-16 NOTE — Progress Notes (Signed)
[facility] MD/PA/NP OP Progress Note  02/16/2018 10:53 AM MAILE LINFORD  MRN:  [id number]  Chief Complaint: doing pretty well  HPI: KEMANI HEIDEL presents with generally euthymic mood.  She reports that she is handling things fairly well and has a good support system.  She has started smoking a few cigarettes every day again as a way of rewarding herself.  She has a very matter-of-fact attitude about her cancer treatment and is trying to do her best to balance a quality of life with ongoing cancer treatment.  She reports that she is overall handling things pretty well in terms of her emotions.  She likes this current combination of medications and feels like the Abilify has added quite a bit of value.  We agreed to continue the current medication regimen and follow-up in the coming 6 months.   She will be going through another round of chemotherapy in the coming weeks, and eventually a stem cell transplant.  I encouraged her to keep me in the loop and let me know how we can continue to support her.  Visit Diagnosis:    ICD-10-CM   1. Major depressive disorder, recurrent episode, moderate (HCC) F33.1 ARIPiprazole (ABILIFY) 2 MG tablet    amitriptyline (ELAVIL) 25 MG tablet    DULoxetine (CYMBALTA) 30 MG capsule    DULoxetine (CYMBALTA) 60 MG capsule    temazepam (RESTORIL) 30 MG capsule  2. Severe episode of recurrent major depressive disorder, without psychotic features (HCC) F33.2 ARIPiprazole (ABILIFY) 2 MG tablet  3. Psychophysiological insomnia F51.04 ARIPiprazole (ABILIFY) 2 MG tablet    amitriptyline (ELAVIL) 25 MG tablet    DULoxetine (CYMBALTA) 30 MG capsule    temazepam (RESTORIL) 30 MG capsule    Past Psychiatric History: See intake H&P for full details. Reviewed, with no updates at this time.   Past Medical History:  Past Medical History:  Diagnosis Date  . Anemia    2016 on iron for a while   . Anxiety   . ARTHRITIS 10/25/2007  . Arthritis   . Blood transfusion without reported  diagnosis   . BREAST CANCER, HX OF 08/03/2006  . Cancer ([place])    hx of breast ca x 2 on right   . DEPRESSION 08/03/2006  . Diabetes ([place])    prediabetic   . DIABETES MELLITUS, TYPE II 08/03/2006  . DYSPNEA ON EXERTION 11/21/2009   shortness of breath with exertion   . EPISTAXIS, RECURRENT 01/27/2008  . FIBROMYALGIA 01/13/2008  . GERD 04/30/2008  . HEPATITIS C 08/03/2006  . HIP PAIN, RIGHT 04/30/2007  . History of detached retina repair 02/12/2011  . HYPERTENSION 04/30/2008  . INSOMNIA-SLEEP DISORDER-UNSPEC 11/21/2009  . KNEE SPRAIN, ACUTE 01/27/2008  . LEG PAIN, LEFT 01/13/2008  . OSTEOPENIA 07/26/2007  . Persistent vomiting 08/17/2008  . Pneumonia    hx of   . Prediabetes   . Thyroid nodule     Past Surgical History:  Procedure Laterality Date  . ABDOMINAL HYSTERECTOMY    . BREAST LUMPECTOMY  2000   right with axillary LND  . CHOLECYSTECTOMY  06/05/2012   Procedure: LAPAROSCOPIC CHOLECYSTECTOMY;  Surgeon: Adin Hector, MD;  Location: [facility];  Service: General;  Laterality: N/A;  . LAPAROSCOPIC LYSIS OF ADHESIONS  06/05/2012   Procedure: LAPAROSCOPIC LYSIS OF ADHESIONS;  Surgeon: Adin Hector, MD;  Location: [facility];  Service: General;  Laterality: N/A;  . MASTECTOMY  2007   Dr. Margot Chimes  . s/p right broken leg with surgury as  teen    . s/p thyroid FNA neg.    . TONSILLECTOMY    . TOTAL HIP ARTHROPLASTY Right 01/25/2016   Procedure: RIGHT TOTAL HIP ARTHROPLASTY ANTERIOR APPROACH;  Surgeon: Mcarthur Rossetti, MD;  Location: [facility];  Service: Orthopedics;  Laterality: Right;  . TUBAL LIGATION      Family Psychiatric History: See intake H&P for full details. Reviewed, with no updates at this time.   Family History:  Family History  Problem Relation Age of Onset  . Heart disease Mother   . Cancer Sister        breast  . Depression Father   . Depression Brother     Social History:  Social History   Socioeconomic History  . Marital status: Divorced    Spouse name: Not on  file  . Number of children: Not on file  . Years of education: Not on file  . Highest education level: Not on file  Occupational History  . Occupation: Sports coach  Social Needs  . Financial resource strain: Not on file  . Food insecurity:    Worry: Not on file    Inability: Not on file  . Transportation needs:    Medical: Not on file    Non-medical: Not on file  Tobacco Use  . Smoking status: Former Smoker    Packs/day: 4.00    Years: 40.00    Pack years: 160.00    Types: Cigarettes    Last attempt to quit: 06/30/2008    Years since quitting: 9.6  . Smokeless tobacco: Never Used  . Tobacco comment: Pt smoked up to 4 ppd  Substance and Sexual Activity  . Alcohol use: Yes    Alcohol/week: 0.0 standard drinks    Comment: Once a year  . Drug use: No  . Sexual activity: Not Currently  Lifestyle  . Physical activity:    Days per week: Not on file    Minutes per session: Not on file  . Stress: Not on file  Relationships  . Social connections:    Talks on phone: Not on file    Gets together: Not on file    Attends religious service: Not on file    Active member of club or organization: Not on file    Attends meetings of clubs or organizations: Not on file    Relationship status: Not on file  Other Topics Concern  . Not on file  Social History Narrative  . Not on file    Allergies:  Allergies  Allergen Reactions  . Ace Inhibitors Cough  . Venlafaxine Other (See Comments)    REACTION: night sweats  . Iohexol Other (See Comments)     Desc: Pt has been pre medicated with benadryl before scans.  . Tape Rash    "Tears my skin all up"    Metabolic Disorder Labs: Lab Results  Component Value Date   HGBA1C 5.5 04/06/2016   MPG 111 04/06/2016   MPG 128 (H) 06/04/2012   No results found for: PROLACTIN Lab Results  Component Value Date   CHOL 175 08/10/2013   TRIG 250.0 (H) 08/10/2013   HDL 62.80 08/10/2013   CHOLHDL 3 08/10/2013   VLDL 50.0 (H) 08/10/2013    LDLCALC 58 02/10/2011   LDLCALC 66 11/21/2009   Lab Results  Component Value Date   TSH 3.68 08/10/2013   TSH 3.75 02/10/2011    Therapeutic Level Labs: No results found for: LITHIUM No results found for: VALPROATE No components  found for:  CBMZ  Current Medications: Current Outpatient Medications  Medication Sig Dispense Refill  . acyclovir (ZOVIRAX) 400 MG tablet Take 1 tablet (400 mg total) by mouth 2 (two) times daily. 60 tablet 3  . amitriptyline (ELAVIL) 25 MG tablet Take 1 tablet (25 mg total) by mouth at bedtime. 90 tablet 3  . ARIPiprazole (ABILIFY) 2 MG tablet Take 1 tablet (2 mg total) by mouth daily. Start in the morning 90 tablet 3  . aspirin 81 MG tablet Take 181 mg by mouth daily.    Marland Kitchen CANNABIDIOL PO Take 1 Dose by mouth at bedtime as needed (sleep).    . cholestyramine (QUESTRAN) 4 GM/DOSE powder Take 1 packet (4 g total) by mouth 2 (two) times daily with a meal. 378 g 11  . dexamethasone (DECADRON) 4 MG tablet Take 5 tablets (20 mg) on the day before and the day of Velcade chemotherapy. Repeat every 21 days. 40 tablet 3  . diphenhydrAMINE (BENADRYL) 25 MG tablet Take 25 mg by mouth every 6 (six) hours as needed.    . DULoxetine (CYMBALTA) 30 MG capsule Take 1 capsule (30 mg total) by mouth daily. Take with 60 mg capsule for a total of 90 mg daily 90 capsule 3  . DULoxetine (CYMBALTA) 60 MG capsule Take 1 capsule (60 mg total) by mouth daily. Take with 30 mg capsule for a total of 90 mg daily 90 capsule 3  . gabapentin (NEURONTIN) 300 MG capsule Take 2 capsules (600 mg total) by mouth 4 (four) times daily. 720 capsule 3  . lenalidomide (REVLIMID) 25 MG capsule TAKE 1 CAPSULE (25MG ) BY MOUTH DAILY FOR 21 DAYS 21 capsule 0  . LORazepam (ATIVAN) 0.5 MG tablet Take 1 tablet (0.5 mg total) by mouth every 6 (six) hours as needed (Nausea or vomiting). 30 tablet 0  . Melatonin 5 MG TABS Take 5 mg by mouth at bedtime.    . Multiple Vitamins-Minerals (MULTIVITAMIN WITH MINERALS)  tablet Take 1 tablet by mouth daily.    Marland Kitchen omeprazole (PRILOSEC) 20 MG capsule Take 2 capsules (40 mg total) by mouth daily. (Patient taking differently: Take 20 mg by mouth daily. ) 180 capsule 3  . ondansetron (ZOFRAN) 8 MG tablet Take 1 tablet (8 mg total) by mouth 2 (two) times daily as needed (Nausea or vomiting). 30 tablet 1  . oxyCODONE-acetaminophen (PERCOCET) 10-325 MG tablet Take 1 tablet by mouth 5 (five) times daily as needed for pain.     Marland Kitchen prochlorperazine (COMPAZINE) 10 MG tablet Take 1 tablet (10 mg total) by mouth every 6 (six) hours as needed (Nausea or vomiting). 30 tablet 1  . temazepam (RESTORIL) 30 MG capsule Take 1 capsule (30 mg total) by mouth Nightly. 90 capsule 3   No current facility-administered medications for this visit.      Musculoskeletal: Strength & Muscle Tone: within normal limits Gait & Station: normal Patient leans: N/A  Psychiatric Specialty Exam: ROS  Blood pressure 120/76, pulse (!) 110, height 5' (1.524 m), weight 178 lb 1.6 oz (80.8 kg), SpO2 97 %.Body mass index is 34.78 kg/m.  General Appearance: Casual and Fairly Groomed  Eye Contact:  Fair  Speech:  Clear and Coherent and Normal Rate  Volume:  Normal  Mood:  Euthymic and pretty good  Affect:  Congruent  Thought Process:  Coherent, Goal Directed and Descriptions of Associations: Intact  Orientation:  Full (Time, Place, and Person)  Thought Content: Logical   Suicidal Thoughts:  No  Homicidal  Thoughts:  No  Memory:  Immediate;   Fair  Judgement:  Good  Insight:  Good  Psychomotor Activity:  Normal  Concentration:  Attention Span: Good  Recall:  Good  Fund of Knowledge: Good  Language: Good  Akathisia:  Negative  Handed:  Right  AIMS (if indicated): not done  Assets:  Communication Skills Desire for Improvement  ADL's:  Intact  Cognition: WNL  Sleep:  Good   Screenings: PHQ2-9     Office Visit from 08/10/2013 in [place]  PHQ-2 Total Score  1        Assessment and Plan:  ROSALIA MCAVOY presents with fairly stable symptom management and seems to be handling her medical stressors fairly well.  She is stable on the current regimen below and we agreed to continue and follow-up in 6 months.  1. Major depressive disorder, recurrent episode, moderate ([place])   2. Severe episode of recurrent major depressive disorder, without psychotic features ([place])   3. Psychophysiological insomnia     Status of current problems: stable  Labs Ordered: No orders of the defined types were placed in this encounter.   Labs Reviewed: na  Collateral Obtained/Records Reviewed: na  Plan:  Continue Restoril 30 mg nightly Continue Cymbalta 90 mg daily Continue Abilify 2 mg daily Continue amitriptyline 25 mg nightly Continue gabapentin 600 mg 4 times daily Okay for patient to use Ativan 0.5 mg for nausea/vomiting; prescribed by oncology rtc 8 weeks  Aundra Dubin, MD 02/16/2018, 10:53 AM

## 2018-02-16 NOTE — Patient Instructions (Signed)
[address] [place], [place], [place], [address]  [phone]  06/14/18 Monday at 9:30 AM

## 2018-02-18 ENCOUNTER — Inpatient Hospital Stay: Payer: PPO

## 2018-02-18 DIAGNOSIS — C9000 Multiple myeloma not having achieved remission: Secondary | ICD-10-CM

## 2018-02-18 DIAGNOSIS — Z5111 Encounter for antineoplastic chemotherapy: Secondary | ICD-10-CM | POA: Diagnosis not present

## 2018-02-18 LAB — CBC WITH DIFFERENTIAL (CANCER CENTER ONLY)
Basophils Absolute: 0 10*3/uL (ref 0.0–0.1)
Basophils Relative: 1 %
EOS ABS: 0.1 10*3/uL (ref 0.0–0.5)
EOS PCT: 4 %
HCT: 31.8 % — ABNORMAL LOW (ref 34.8–46.6)
HEMOGLOBIN: 9.9 g/dL — AB (ref 11.6–15.9)
LYMPHS ABS: 1.2 10*3/uL (ref 0.9–3.3)
Lymphocytes Relative: 37 %
MCH: 27.9 pg (ref 25.1–34.0)
MCHC: 31.1 g/dL — AB (ref 31.5–36.0)
MCV: 89.7 fL (ref 79.5–101.0)
MONOS PCT: 17 %
Monocytes Absolute: 0.5 10*3/uL (ref 0.1–0.9)
Neutro Abs: 1.4 10*3/uL — ABNORMAL LOW (ref 1.5–6.5)
Neutrophils Relative %: 41 %
Platelet Count: 205 10*3/uL (ref 145–400)
RBC: 3.54 MIL/uL — ABNORMAL LOW (ref 3.70–5.45)
RDW: 19 % — ABNORMAL HIGH (ref 11.2–14.5)
WBC Count: 3.3 10*3/uL — ABNORMAL LOW (ref 3.9–10.3)

## 2018-02-18 LAB — CMP (CANCER CENTER ONLY)
ALBUMIN: 3.1 g/dL — AB (ref 3.5–5.0)
ALT: 53 U/L — AB (ref 0–44)
AST: 43 U/L — ABNORMAL HIGH (ref 15–41)
Alkaline Phosphatase: 52 U/L (ref 38–126)
Anion gap: 8 (ref 5–15)
BUN: 22 mg/dL (ref 8–23)
CHLORIDE: 105 mmol/L (ref 98–111)
CO2: 26 mmol/L (ref 22–32)
CREATININE: 0.77 mg/dL (ref 0.44–1.00)
Calcium: 8.2 mg/dL — ABNORMAL LOW (ref 8.9–10.3)
GFR, Est AFR Am: >60 mL/min (ref >60)
GFR, Estimated: >60 mL/min (ref >60)
Glucose, Bld: 98 mg/dL (ref 70–99)
POTASSIUM: 4.3 mmol/L (ref 3.5–5.1)
SODIUM: 139 mmol/L (ref 135–145)
Total Bilirubin: 0.3 mg/dL (ref 0.3–1.2)
Total Protein: 6 g/dL — ABNORMAL LOW (ref 6.5–8.1)

## 2018-02-19 ENCOUNTER — Telehealth: Payer: Self-pay

## 2018-02-19 LAB — KAPPA/LAMBDA LIGHT CHAINS
Kappa free light chain: 203.2 mg/L — ABNORMAL HIGH (ref 3.3–19.4)
Kappa, lambda light chain ratio: 29.88 — ABNORMAL HIGH (ref 0.26–1.65)
Lambda free light chains: 6.8 mg/L (ref 5.7–26.3)

## 2018-02-19 NOTE — Telephone Encounter (Signed)
Spoke with pt regarding message from md pt voiced understanding

## 2018-02-19 NOTE — Telephone Encounter (Signed)
-----   Message from Ladell Pier, MD sent at 02/19/2018  3:44 PM EDT ----- Please call patient, kappa light chains are improved, follow-up as scheduled, we will discuss plan after [facility] appointment

## 2018-02-21 ENCOUNTER — Other Ambulatory Visit: Payer: Self-pay | Admitting: Oncology

## 2018-02-22 ENCOUNTER — Telehealth: Payer: Self-pay | Admitting: Oncology

## 2018-02-22 DIAGNOSIS — Z79899 Other long term (current) drug therapy: Secondary | ICD-10-CM | POA: Diagnosis not present

## 2018-02-22 DIAGNOSIS — C9000 Multiple myeloma not having achieved remission: Secondary | ICD-10-CM | POA: Diagnosis not present

## 2018-02-23 ENCOUNTER — Telehealth: Payer: Self-pay | Admitting: Oncology

## 2018-02-23 ENCOUNTER — Inpatient Hospital Stay (HOSPITAL_BASED_OUTPATIENT_CLINIC_OR_DEPARTMENT_OTHER): Payer: PPO | Admitting: Oncology

## 2018-02-23 ENCOUNTER — Other Ambulatory Visit: Payer: PPO

## 2018-02-23 ENCOUNTER — Inpatient Hospital Stay: Payer: PPO

## 2018-02-23 VITALS — BP 138/79 | HR 96 | Temp 98.4°F | Resp 18 | Ht 60.0 in | Wt 180.1 lb

## 2018-02-23 DIAGNOSIS — D649 Anemia, unspecified: Secondary | ICD-10-CM | POA: Diagnosis not present

## 2018-02-23 DIAGNOSIS — C9000 Multiple myeloma not having achieved remission: Secondary | ICD-10-CM

## 2018-02-23 DIAGNOSIS — Z79899 Other long term (current) drug therapy: Secondary | ICD-10-CM

## 2018-02-23 DIAGNOSIS — Z853 Personal history of malignant neoplasm of breast: Secondary | ICD-10-CM

## 2018-02-23 DIAGNOSIS — E041 Nontoxic single thyroid nodule: Secondary | ICD-10-CM | POA: Diagnosis not present

## 2018-02-23 DIAGNOSIS — Z5111 Encounter for antineoplastic chemotherapy: Secondary | ICD-10-CM | POA: Diagnosis not present

## 2018-02-23 NOTE — Progress Notes (Signed)
[facility] OFFICE PROGRESS NOTE   Diagnosis: Multiple myeloma  INTERVAL HISTORY:   [name] returns for a scheduled visit.  She completed cycle 5 RVD last week.  She reports malaise and intermittent dyspnea.  No other complaint.  She saw Dr. Melburn Hake [facility] yesterday.  He recommends switching to a different systemic therapy regimen with the hope of obtaining a deeper remission prior to stem cell therapy.  Objective:  Vital signs in last 24 hours:  Blood pressure 138/79, pulse 96, temperature 98.4 F (36.9 C), temperature source Oral, resp. rate 18, height 5' (1.524 m), weight 180 lb 1.6 oz (81.7 kg), SpO2 100 %.    HEENT: No thrush Resp: Lungs clear bilaterally Cardio: Regular rate and rhythm GI: No hepatosplenomegaly, nontender Vascular: Leg edema      Lab Results:  Lab Results  Component Value Date   WBC 3.3 (L) 02/18/2018   HGB 9.9 (L) 02/18/2018   HCT 31.8 (L) 02/18/2018   MCV 89.7 02/18/2018   PLT 205 02/18/2018   NEUTROABS 1.4 (L) 02/18/2018    CMP  Lab Results  Component Value Date   NA 139 02/18/2018   K 4.3 02/18/2018   CL 105 02/18/2018   CO2 26 02/18/2018   GLUCOSE 98 02/18/2018   BUN 22 02/18/2018   CREATININE 0.77 02/18/2018   CALCIUM 8.2 (L) 02/18/2018   PROT 6.0 (L) 02/18/2018   ALBUMIN 3.1 (L) 02/18/2018   AST 43 (H) 02/18/2018   ALT 53 (H) 02/18/2018   ALKPHOS 52 02/18/2018   BILITOT 0.3 02/18/2018   GFRNONAA >60 02/18/2018   GFRAA >60 02/18/2018   Ferritin 10, iron 85, TIBC 585, percent saturation 9 Labs at [facility] 02/22/2018: IgG 322, IgM 43, IgA 377, free kappa light chains to 12.95, ratio 37.1 10.7, platelet 304,000, white count 4.9, ANC 4.7  Medications: I have reviewed the patient's current medications.   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 2.4 cm solid right thyroid mass, follow-up ultrasound recommended  Biopsy 01/14/2018-atypia of undetermined significance or follicular lesion of undetermined significance  Affirma testing- likely a benign lesion  6.Right rib fractures 12/21/2017    Disposition: [name] appears unchanged.  She has completed 5 cycles of RVD.  There has been partial improvement in the elevated kappa light chains and IgA level.  She was evaluated for stem cell therapy at [facility] yesterday.  Dr.Rodriguez Stacie Glaze recommends changing treatment to carfilzomib/cyclophosphamide/Decadron in an attempt to gain a deeper remission prior to stem cell therapy.  I discussed the case with Dr. Melburn Hake today.  I reviewed potential toxicities associated with the carfilzomib/cyclophosphamide/Decadron regimen with [name].  She understands the potential for an allergic reaction, fluid retention, hypertension, hematologic toxicity, neuropathy, nausea, diarrhea, mucositis, and alopecia.  She agrees to proceed.  She will be referred for placement of a Port-A-Cath.  The plan is to initiate cycle 1 carfilzomib/cyclophosphamide/Decadron on 03/02/2018.  She will be scheduled for an office visit on 03/16/2018.  [name] has persistent anemia.  The ferritin was low yesterday.  She appears to have iron deficiency in addition to anemia secondary to myeloma and chemotherapy.  We will  check stool Hemoccult cards.  She had a negative colonoscopy in May 2006.  40 minutes were spent with the patient today.  The majority of the time was used for counseling and coordination of care.  Betsy Coder, MD  02/23/2018  12:23 PM

## 2018-02-23 NOTE — Progress Notes (Signed)
DISCONTINUE ON PATHWAY REGIMEN - Multiple Myeloma and Other Plasma Cell Dyscrasias     A cycle is every 21 days:     Bortezomib      Lenalidomide      Dexamethasone   **Always confirm dose/schedule in your pharmacy ordering system**  REASON: Continuation Of Treatment PRIOR TREATMENT: [id number]: VRd (Bortezomib 1.3 mg/m2 Subcut D1, 4, 8, 11 + Lenalidomide 25 mg + Dexamethasone 20 mg) q21 Days x 4-6 Cycles Maximum Prior to Stem Cell [place] TREATMENT RESPONSE: Partial Response (PR)  START ON PATHWAY REGIMEN - Multiple Myeloma and Other Plasma Cell Dyscrasias     A cycle is every 28 days:     Dexamethasone      Cyclophosphamide      Carfilzomib      Carfilzomib      Carfilzomib   **Always confirm dose/schedule in your pharmacy ordering system**  Patient Characteristics: Relapsed / Refractory, All Lines of Therapy R-ISS Staging: Unknown Disease Classification: Refractory Line of Therapy: Second Line Intent of Therapy: Non-Curative / Palliative Intent, Discussed with Patient

## 2018-02-23 NOTE — Telephone Encounter (Signed)
Appts scheduled AVS/Calendar printed per 8/27 los °

## 2018-02-24 ENCOUNTER — Telehealth: Payer: Self-pay

## 2018-02-24 NOTE — Telephone Encounter (Signed)
LVM for pt to return call to clinic.    ---------Message from Betsy Coder, MD---- MD informed RN to notify pt that he spoke with Dr. Sharren Bridge and he confirmed that plan for chemo is 3 weeks of cafilzomib and cytoxan (IV administration). Also pt will take decadron 20mg  weekly. We can prescribe for home or administer decadron in the clinic. Also to inform pt that iron is low. Provider ordered stool hemoccults for pt to pick-up and submit for next week (9/3). Pt to start ferrous sulfate 325mg  BID or continue ferrous gluconate if she has it.

## 2018-02-25 ENCOUNTER — Ambulatory Visit: Payer: Self-pay | Admitting: Pulmonary Disease

## 2018-02-25 ENCOUNTER — Other Ambulatory Visit: Payer: Self-pay | Admitting: Student

## 2018-02-25 ENCOUNTER — Telehealth: Payer: Self-pay | Admitting: *Deleted

## 2018-02-25 ENCOUNTER — Other Ambulatory Visit: Payer: Self-pay | Admitting: Nurse Practitioner

## 2018-02-25 DIAGNOSIS — C9000 Multiple myeloma not having achieved remission: Secondary | ICD-10-CM

## 2018-02-25 MED ORDER — DIAZEPAM 2 MG PO TABS: 2.0000 mg | ORAL_TABLET | Freq: Every day | ORAL | 0 refills | Status: DC | PRN

## 2018-02-25 NOTE — Telephone Encounter (Signed)
Pt called with concern about upcoming treatments. Requesting 6 valium pills as she has 6 chemotherapy treatments. Reviewed with Lattie Haw, NP who will escribe it to Pt Pharmacy. Notified pt.

## 2018-02-26 ENCOUNTER — Other Ambulatory Visit: Payer: Self-pay | Admitting: Oncology

## 2018-02-26 ENCOUNTER — Other Ambulatory Visit: Payer: Self-pay | Admitting: *Deleted

## 2018-02-26 ENCOUNTER — Ambulatory Visit (HOSPITAL_COMMUNITY)
Admission: RE | Admit: 2018-02-26 | Discharge: 2018-02-26 | Disposition: A | Discharge status: Home / Self Care | Payer: PPO | Source: Ambulatory Visit | Attending: Oncology | Admitting: Oncology

## 2018-02-26 ENCOUNTER — Encounter (HOSPITAL_COMMUNITY): Payer: Self-pay

## 2018-02-26 ENCOUNTER — Telehealth: Payer: Self-pay | Admitting: Oncology

## 2018-02-26 DIAGNOSIS — Z803 Family history of malignant neoplasm of breast: Secondary | ICD-10-CM | POA: Diagnosis not present

## 2018-02-26 DIAGNOSIS — Z818 Family history of other mental and behavioral disorders: Secondary | ICD-10-CM | POA: Insufficient documentation

## 2018-02-26 DIAGNOSIS — Z5111 Encounter for antineoplastic chemotherapy: Secondary | ICD-10-CM | POA: Diagnosis not present

## 2018-02-26 DIAGNOSIS — Z8249 Family history of ischemic heart disease and other diseases of the circulatory system: Secondary | ICD-10-CM | POA: Insufficient documentation

## 2018-02-26 DIAGNOSIS — I10 Essential (primary) hypertension: Secondary | ICD-10-CM | POA: Insufficient documentation

## 2018-02-26 DIAGNOSIS — Z9049 Acquired absence of other specified parts of digestive tract: Secondary | ICD-10-CM | POA: Diagnosis not present

## 2018-02-26 DIAGNOSIS — K219 Gastro-esophageal reflux disease without esophagitis: Secondary | ICD-10-CM | POA: Diagnosis not present

## 2018-02-26 DIAGNOSIS — Z87891 Personal history of nicotine dependence: Secondary | ICD-10-CM | POA: Diagnosis not present

## 2018-02-26 DIAGNOSIS — Z9889 Other specified postprocedural states: Secondary | ICD-10-CM | POA: Diagnosis not present

## 2018-02-26 DIAGNOSIS — Z888 Allergy status to other drugs, medicaments and biological substances status: Secondary | ICD-10-CM | POA: Insufficient documentation

## 2018-02-26 DIAGNOSIS — Z853 Personal history of malignant neoplasm of breast: Secondary | ICD-10-CM | POA: Diagnosis not present

## 2018-02-26 DIAGNOSIS — D649 Anemia, unspecified: Secondary | ICD-10-CM | POA: Diagnosis not present

## 2018-02-26 DIAGNOSIS — Z79899 Other long term (current) drug therapy: Secondary | ICD-10-CM | POA: Insufficient documentation

## 2018-02-26 DIAGNOSIS — Z96641 Presence of right artificial hip joint: Secondary | ICD-10-CM | POA: Insufficient documentation

## 2018-02-26 DIAGNOSIS — F419 Anxiety disorder, unspecified: Secondary | ICD-10-CM | POA: Diagnosis not present

## 2018-02-26 DIAGNOSIS — Z9010 Acquired absence of unspecified breast and nipple: Secondary | ICD-10-CM | POA: Insufficient documentation

## 2018-02-26 DIAGNOSIS — F329 Major depressive disorder, single episode, unspecified: Secondary | ICD-10-CM | POA: Diagnosis not present

## 2018-02-26 DIAGNOSIS — G4700 Insomnia, unspecified: Secondary | ICD-10-CM | POA: Diagnosis not present

## 2018-02-26 DIAGNOSIS — C9000 Multiple myeloma not having achieved remission: Secondary | ICD-10-CM

## 2018-02-26 DIAGNOSIS — M1990 Unspecified osteoarthritis, unspecified site: Secondary | ICD-10-CM | POA: Insufficient documentation

## 2018-02-26 DIAGNOSIS — M797 Fibromyalgia: Secondary | ICD-10-CM | POA: Insufficient documentation

## 2018-02-26 DIAGNOSIS — Z452 Encounter for adjustment and management of vascular access device: Secondary | ICD-10-CM | POA: Diagnosis not present

## 2018-02-26 DIAGNOSIS — E119 Type 2 diabetes mellitus without complications: Secondary | ICD-10-CM | POA: Diagnosis not present

## 2018-02-26 DIAGNOSIS — Z7982 Long term (current) use of aspirin: Secondary | ICD-10-CM | POA: Insufficient documentation

## 2018-02-26 HISTORY — PX: IR IMAGING GUIDED PORT INSERTION: IMG5740

## 2018-02-26 LAB — CBC
HCT: 33.9 % — ABNORMAL LOW (ref 36.0–46.0)
Hemoglobin: 10.6 g/dL — ABNORMAL LOW (ref 12.0–15.0)
MCH: 28.1 pg (ref 26.0–34.0)
MCHC: 31.3 g/dL (ref 30.0–36.0)
MCV: 89.9 fL (ref 78.0–100.0)
PLATELETS: 301 10*3/uL (ref 150–400)
RBC: 3.77 MIL/uL — AB (ref 3.87–5.11)
RDW: 17.6 % — AB (ref 11.5–15.5)
WBC: 4.2 10*3/uL (ref 4.0–10.5)

## 2018-02-26 LAB — APTT: aPTT: 28 seconds (ref 24–36)

## 2018-02-26 LAB — PROTIME-INR
INR: 0.94
PROTHROMBIN TIME: 12.5 s (ref 11.4–15.2)

## 2018-02-26 MED ORDER — SODIUM CHLORIDE 0.9 % IV SOLN
INTRAVENOUS | Status: DC
  Administered 2018-02-26: 08:00:00 via INTRAVENOUS

## 2018-02-26 MED ORDER — CEFAZOLIN SODIUM-DEXTROSE 2-4 GM/100ML-% IV SOLN
2.0000 g | Freq: Once | INTRAVENOUS | Status: AC
  Administered 2018-02-26: 2 g via INTRAVENOUS

## 2018-02-26 MED ORDER — LIDOCAINE HCL (PF) 1 % IJ SOLN
INTRAMUSCULAR | Status: AC | PRN
  Administered 2018-02-26: 10 mL

## 2018-02-26 MED ORDER — HEPARIN SOD (PORK) LOCK FLUSH 100 UNIT/ML IV SOLN
INTRAVENOUS | Status: AC
  Filled 2018-02-26: qty 5

## 2018-02-26 MED ORDER — FENTANYL CITRATE (PF) 100 MCG/2ML IJ SOLN
INTRAMUSCULAR | Status: AC | PRN
  Administered 2018-02-26 (×2): 50 ug via INTRAVENOUS

## 2018-02-26 MED ORDER — LIDOCAINE-PRILOCAINE 2.5-2.5 % EX CREA: 1.0000 "application " | TOPICAL_CREAM | CUTANEOUS | 2 refills | Status: AC | PRN

## 2018-02-26 MED ORDER — MIDAZOLAM HCL 2 MG/2ML IJ SOLN
INTRAMUSCULAR | Status: AC
  Filled 2018-02-26: qty 4

## 2018-02-26 MED ORDER — CEFAZOLIN SODIUM-DEXTROSE 1-4 GM/50ML-% IV SOLN
1.0000 g | Freq: Once | INTRAVENOUS | Status: DC
  Filled 2018-02-26: qty 50

## 2018-02-26 MED ORDER — LIDOCAINE HCL 1 % IJ SOLN
INTRAMUSCULAR | Status: AC
  Filled 2018-02-26: qty 20

## 2018-02-26 MED ORDER — LIDOCAINE-EPINEPHRINE (PF) 2 %-1:200000 IJ SOLN
INTRAMUSCULAR | Status: AC
  Filled 2018-02-26: qty 20

## 2018-02-26 MED ORDER — FENTANYL CITRATE (PF) 100 MCG/2ML IJ SOLN
INTRAMUSCULAR | Status: AC
  Filled 2018-02-26: qty 2

## 2018-02-26 MED ORDER — MIDAZOLAM HCL 2 MG/2ML IJ SOLN
INTRAMUSCULAR | Status: AC | PRN
  Administered 2018-02-26 (×2): 1 mg via INTRAVENOUS

## 2018-02-26 MED ORDER — HEPARIN SOD (PORK) LOCK FLUSH 100 UNIT/ML IV SOLN
INTRAVENOUS | Status: AC | PRN
  Administered 2018-02-26: 500 [IU] via INTRAVENOUS

## 2018-02-26 MED ORDER — LIDOCAINE HCL (PF) 1 % IJ SOLN
INTRAMUSCULAR | Status: AC | PRN
  Administered 2018-02-26: 5 mL

## 2018-02-26 MED ORDER — CEFAZOLIN SODIUM-DEXTROSE 2-4 GM/100ML-% IV SOLN
INTRAVENOUS | Status: AC
  Filled 2018-02-26: qty 100

## 2018-02-26 NOTE — Procedures (Signed)
Placement of left chest port. Tip at SVC/RA junction.  Minimal blood loss and no immediate complication.

## 2018-02-26 NOTE — Telephone Encounter (Signed)
Appts added to scheduled and patient notified.  She will get new schedule at next visit per 8/27 los

## 2018-02-26 NOTE — Discharge Instructions (Signed)
Please keep dressing on for 24 hours. After this, you may take a shower.   If you have a prescription for EMLA cream, please DO NOT use that until all glue and steri-strips have come off on their own.   Implanted Port Insertion, Care After This sheet gives you information about how to care for yourself after your procedure. Your health care provider may also give you more specific instructions. If you have problems or questions, contact your health care provider. What can I expect after the procedure? After your procedure, it is common to have:  Discomfort at the port insertion site.  Bruising on the skin over the port. This should improve over 3-4 days.  Follow these instructions at home: [facility] care  After your port is placed, you will get a manufacturer's information card. The card has information about your port. Keep this card with you at all times.  Take care of the port as told by your health care provider. Ask your health care provider if you or a family member can get training for taking care of the port at home. A home health care nurse may also take care of the port.  Make sure to remember what type of port you have. Incision care  Follow instructions from your health care provider about how to take care of your port insertion site. Make sure you: ? Wash your hands with soap and water before you change your bandage (dressing). If soap and water are not available, use hand sanitizer. ? Change your dressing as told by your health care provider. ? Leave stitches (sutures), skin glue, or adhesive strips in place. These skin closures may need to stay in place for 2 weeks or longer. If adhesive strip edges start to loosen and curl up, you may trim the loose edges. Do not remove adhesive strips completely unless your health care provider tells you to do that.  Check your port insertion site every day for signs of infection. Check for: ? More redness, swelling, or pain. ? More fluid or  blood. ? Warmth. ? Pus or a bad smell. General instructions  Do not take baths, swim, or use a hot tub until your health care provider approves.  Do not lift anything that is heavier than 10 lb (4.5 kg) for a week, or as told by your health care provider.  Ask your health care provider when it is okay to: ? Return to work or school. ? Resume usual physical activities or sports.  Do not drive for 24 hours if you were given a medicine to help you relax (sedative).  Take over-the-counter and prescription medicines only as told by your health care provider.  Wear a medical alert bracelet in case of an emergency. This will tell any health care providers that you have a port.  Keep all follow-up visits as told by your health care provider. This is important. Contact a health care provider if:  You cannot flush your port with saline as directed, or you cannot draw blood from the port.  You have a fever or chills.  You have more redness, swelling, or pain around your port insertion site.  You have more fluid or blood coming from your port insertion site.  Your port insertion site feels warm to the touch.  You have pus or a bad smell coming from the port insertion site. Get help right away if:  You have chest pain or shortness of breath.  You have bleeding from your  port that you cannot control. Summary  Take care of the port as told by your health care provider.  Change your dressing as told by your health care provider.  Keep all follow-up visits as told by your health care provider. This information is not intended to replace advice given to you by your health care provider. Make sure you discuss any questions you have with your health care provider. Document Released: 04/06/2013 Document Revised: 05/07/2016 Document Reviewed: 05/07/2016 Elsevier Interactive Patient Education  2017 [place].    Moderate Conscious Sedation, Adult, Care After These instructions  provide you with information about caring for yourself after your procedure. Your health care provider may also give you more specific instructions. Your treatment has been planned according to current medical practices, but problems sometimes occur. Call your health care provider if you have any problems or questions after your procedure. What can I expect after the procedure? After your procedure, it is common:  To feel sleepy for several hours.  To feel clumsy and have poor balance for several hours.  To have poor judgment for several hours.  To vomit if you eat too soon.  Follow these instructions at home: For at least 24 hours after the procedure:   Do not: ? Participate in activities where you could fall or become injured. ? Drive. ? Use heavy machinery. ? Drink alcohol. ? Take sleeping pills or medicines that cause drowsiness. ? Make important decisions or sign legal documents. ? Take care of children on your own.  Rest. Eating and drinking  Follow the diet recommended by your health care provider.  If you vomit: ? Drink water, juice, or soup when you can drink without vomiting. ? Make sure you have little or no nausea before eating solid foods. General instructions  Have a responsible adult stay with you until you are awake and alert.  Take over-the-counter and prescription medicines only as told by your health care provider.  If you smoke, do not smoke without supervision.  Keep all follow-up visits as told by your health care provider. This is important. Contact a health care provider if:  You keep feeling nauseous or you keep vomiting.  You feel light-headed.  You develop a rash.  You have a fever. Get help right away if:  You have trouble breathing. This information is not intended to replace advice given to you by your health care provider. Make sure you discuss any questions you have with your health care provider. Document Released: 04/06/2013  Document Revised: 11/19/2015 Document Reviewed: 10/06/2015 Elsevier Interactive Patient Education  [employer].

## 2018-02-26 NOTE — H&P (Signed)
Chief Complaint: Patient was seen in consultation today for port placement at the request of Sherrill,Gary B  Referring Physician(s): Ladell Pier  Supervising Physician: Markus Daft  Patient Status: [facility] - Out-pt  History of Present Illness: [name] is a 67 y.o. female with myeloma. She is to start chemotherapy next week and is referred for port placement. She had a left sided port placed about 10 years ago when she went through chemo for breast cancer. PMHx, meds, labs, imaging, allergies reviewed. Feels well, no recent fevers, chills, illness. Has been NPO today as directed. Family at bedside.   Past Medical History:  Diagnosis Date  . Anemia    2016 on iron for a while   . Anxiety   . ARTHRITIS 10/25/2007  . Arthritis   . Blood transfusion without reported diagnosis   . BREAST CANCER, HX OF 08/03/2006  . Cancer ([place])    hx of breast ca x 2 on right   . DEPRESSION 08/03/2006  . Diabetes ([place])    prediabetic   . DIABETES MELLITUS, TYPE II 08/03/2006  . DYSPNEA ON EXERTION 11/21/2009   shortness of breath with exertion   . EPISTAXIS, RECURRENT 01/27/2008  . FIBROMYALGIA 01/13/2008  . GERD 04/30/2008  . HEPATITIS C 08/03/2006  . HIP PAIN, RIGHT 04/30/2007  . History of detached retina repair 02/12/2011  . HYPERTENSION 04/30/2008  . INSOMNIA-SLEEP DISORDER-UNSPEC 11/21/2009  . KNEE SPRAIN, ACUTE 01/27/2008  . LEG PAIN, LEFT 01/13/2008  . OSTEOPENIA 07/26/2007  . Persistent vomiting 08/17/2008  . Pneumonia    hx of   . Prediabetes   . Thyroid nodule     Past Surgical History:  Procedure Laterality Date  . ABDOMINAL HYSTERECTOMY    . BREAST LUMPECTOMY  2000   right with axillary LND  . CHOLECYSTECTOMY  06/05/2012   Procedure: LAPAROSCOPIC CHOLECYSTECTOMY;  Surgeon: Adin Hector, MD;  Location: [facility] ORS;  Service: General;  Laterality: N/A;  . LAPAROSCOPIC LYSIS OF ADHESIONS  06/05/2012   Procedure: LAPAROSCOPIC LYSIS OF ADHESIONS;  Surgeon: Adin Hector, MD;   Location: [facility] ORS;  Service: General;  Laterality: N/A;  . MASTECTOMY  2007   Dr. Margot Chimes  . s/p right broken leg with surgury as teen    . s/p thyroid FNA neg.    . TONSILLECTOMY    . TOTAL HIP ARTHROPLASTY Right 01/25/2016   Procedure: RIGHT TOTAL HIP ARTHROPLASTY ANTERIOR APPROACH;  Surgeon: Mcarthur Rossetti, MD;  Location: [facility] ORS;  Service: Orthopedics;  Laterality: Right;  . TUBAL LIGATION      Allergies: Ace inhibitors; Venlafaxine; Iohexol; and Tape  Medications: Prior to Admission medications   Medication Sig Start Date End Date Taking? Authorizing Provider  amitriptyline (ELAVIL) 25 MG tablet Take 1 tablet (25 mg total) by mouth at bedtime. 02/16/18  Yes Eksir, Richard Miu, MD  ARIPiprazole (ABILIFY) 2 MG tablet Take 1 tablet (2 mg total) by mouth daily. Start in the morning 02/16/18  Yes Eksir, Richard Miu, MD  aspirin 81 MG tablet Take 181 mg by mouth daily.   Yes [provider]  cholestyramine Lucrezia Starch) 4 GM/DOSE powder Take 1 packet (4 g total) by mouth 2 (two) times daily with a meal. 04/13/14  Yes Biagio Borg, MD  DULoxetine (CYMBALTA) 30 MG capsule Take 1 capsule (30 mg total) by mouth daily. Take with 60 mg capsule for a total of 90 mg daily 02/16/18 02/16/19 Yes Eksir, Richard Miu, MD  DULoxetine (CYMBALTA) 60 MG capsule  Take 1 capsule (60 mg total) by mouth daily. Take with 30 mg capsule for a total of 90 mg daily 02/16/18 02/16/19 Yes Eksir, Richard Miu, MD  gabapentin (NEURONTIN) 300 MG capsule Take 2 capsules (600 mg total) by mouth 4 (four) times daily. 07/27/17 07/27/18 Yes Eksir, Richard Miu, MD  Melatonin 5 MG TABS Take 5 mg by mouth at bedtime.   Yes [provider]  Multiple Vitamins-Minerals (MULTIVITAMIN WITH MINERALS) tablet Take 1 tablet by mouth daily.   Yes [provider]  omeprazole (PRILOSEC) 20 MG capsule Take 2 capsules (40 mg total) by mouth daily. Patient taking differently: Take 20 mg by mouth daily.   04/13/14  Yes Biagio Borg, MD  oxyCODONE-acetaminophen (PERCOCET) 10-325 MG tablet Take 1 tablet by mouth 5 (five) times daily as needed for pain.    Yes [provider]  temazepam (RESTORIL) 30 MG capsule Take 1 capsule (30 mg total) by mouth Nightly. 02/16/18  Yes Eksir, Richard Miu, MD  CANNABIDIOL PO Take 1 Dose by mouth at bedtime as needed (sleep).    [provider]  diazepam (VALIUM) 2 MG tablet Take 1 tablet (2 mg total) by mouth daily as needed for anxiety. 02/25/18   Owens Shark, NP  diphenhydrAMINE (BENADRYL) 25 MG tablet Take 25 mg by mouth every 6 (six) hours as needed.    [provider]  prochlorperazine (COMPAZINE) 10 MG tablet Take 1 tablet (10 mg total) by mouth every 6 (six) hours as needed (Nausea or vomiting). 10/08/17 02/23/18  Ardath Sax, MD     Family History  Problem Relation Age of Onset  . Heart disease Mother   . Cancer Sister        breast  . Depression Father   . Depression Brother     Social History   Socioeconomic History  . Marital status: Divorced    Spouse name: Not on file  . Number of children: Not on file  . Years of education: Not on file  . Highest education level: Not on file  Occupational History  . Occupation: Sports coach  Social Needs  . Financial resource strain: Not on file  . Food insecurity:    Worry: Not on file    Inability: Not on file  . Transportation needs:    Medical: Not on file    Non-medical: Not on file  Tobacco Use  . Smoking status: Former Smoker    Packs/day: 4.00    Years: 40.00    Pack years: 160.00    Types: Cigarettes    Last attempt to quit: 06/30/2008    Years since quitting: 9.6  . Smokeless tobacco: Never Used  . Tobacco comment: Pt smoked up to 4 ppd  Substance and Sexual Activity  . Alcohol use: Yes    Alcohol/week: 0.0 standard drinks    Comment: Once a year  . Drug use: No  . Sexual activity: Not Currently  Lifestyle  . Physical activity:    Days per  week: Not on file    Minutes per session: Not on file  . Stress: Not on file  Relationships  . Social connections:    Talks on phone: Not on file    Gets together: Not on file    Attends religious service: Not on file    Active member of club or organization: Not on file    Attends meetings of clubs or organizations: Not on file    Relationship status: Not on  file  Other Topics Concern  . Not on file  Social History Narrative  . Not on file     Review of Systems: A 12 point ROS discussed and pertinent positives are indicated in the HPI above.  All other systems are negative.  Review of Systems  Vital Signs: Ht 5' (1.524 m)   Wt 83 kg   BMI 35.74 kg/m   Physical Exam  Constitutional: She is oriented to person, place, and time. She appears well-developed. No distress.  HENT:  Head: Normocephalic.  Mouth/Throat: Oropharynx is clear and moist.  Neck: Normal range of motion. No JVD present.  Cardiovascular: Normal rate, regular rhythm and normal heart sounds.  Pulmonary/Chest: Effort normal and breath sounds normal. No respiratory distress.  Abdominal: Soft.  Neurological: She is alert and oriented to person, place, and time.  Skin: Skin is warm and dry.  Psychiatric: She has a normal mood and affect. Judgment normal.     Imaging: No results found.  Labs:  CBC: Recent Labs    02/02/18 1333 02/10/18 0855 02/18/18 1348 02/26/18 0819  WBC 8.2 11.2* 3.3* 4.2  HGB 11.7 10.7* 9.9* 10.6*  HCT 36.6 35.0 31.8* 33.9*  PLT 183 190 205 301    COAGS: Recent Labs    09/22/17 0912 02/26/18 0819  INR 0.92 0.94  APTT  --  28    BMP: Recent Labs    12/29/17 1219 01/21/18 1103 02/02/18 1333 02/18/18 1348  NA 133* 140 138 139  K 4.6 4.4 4.1 4.3  CL 102 105 103 105  CO2 20* 25 19* 26  GLUCOSE 149* 86 184* 98  BUN 19 11 23 22   CALCIUM 9.1 9.1 9.6 8.2*  CREATININE 0.80 0.73 0.82 0.77  GFRNONAA >60 >60 >60 >60  GFRAA >60 >60 >60 >60    LIVER FUNCTION  TESTS: Recent Labs    12/29/17 1219 01/21/18 1103 02/02/18 1333 02/18/18 1348  BILITOT 0.5 0.3 0.4 0.3  AST 25 34 22 43*  ALT 26 41 42 53*  ALKPHOS 83 55 65 52  PROT 7.0 6.0* 7.3 6.0*  ALBUMIN 3.8 3.3* 3.7 3.1*    TUMOR MARKERS: No results for input(s): AFPTM, CEA, CA199, CHROMGRNA in the last 8760 hours.  Assessment and Plan: Myeloma For port placement Labs ok Risks and benefits of image guided port-a-catheter placement was discussed with the patient including, but not limited to bleeding, infection, pneumothorax, or fibrin sheath development and need for additional procedures.  All of the patient's questions were answered, patient is agreeable to proceed. Consent signed and in chart.    Thank you for this interesting consult.  I greatly enjoyed meeting [name] and look forward to participating in their care.  A copy of this report was sent to the requesting provider on this date.  Electronically Signed: Ascencion Dike, PA-C 02/26/2018, 8:44 AM   I spent a total of 20 minutes in face to face in clinical consultation, greater than 50% of which was counseling/coordinating care for port placement

## 2018-02-28 ENCOUNTER — Other Ambulatory Visit: Payer: Self-pay | Admitting: Oncology

## 2018-03-01 ENCOUNTER — Other Ambulatory Visit: Payer: Self-pay | Admitting: Oncology

## 2018-03-02 ENCOUNTER — Inpatient Hospital Stay: Payer: PPO

## 2018-03-02 ENCOUNTER — Inpatient Hospital Stay: Payer: PPO | Attending: Hematology and Oncology

## 2018-03-02 ENCOUNTER — Other Ambulatory Visit: Payer: Self-pay | Admitting: Oncology

## 2018-03-02 VITALS — BP 106/65 | HR 98 | Temp 98.6°F | Resp 17

## 2018-03-02 DIAGNOSIS — C9000 Multiple myeloma not having achieved remission: Secondary | ICD-10-CM | POA: Diagnosis not present

## 2018-03-02 DIAGNOSIS — Z79899 Other long term (current) drug therapy: Secondary | ICD-10-CM | POA: Insufficient documentation

## 2018-03-02 DIAGNOSIS — Z5111 Encounter for antineoplastic chemotherapy: Secondary | ICD-10-CM | POA: Diagnosis not present

## 2018-03-02 DIAGNOSIS — Z23 Encounter for immunization: Secondary | ICD-10-CM | POA: Diagnosis not present

## 2018-03-02 DIAGNOSIS — Z853 Personal history of malignant neoplasm of breast: Secondary | ICD-10-CM | POA: Diagnosis not present

## 2018-03-02 DIAGNOSIS — D649 Anemia, unspecified: Secondary | ICD-10-CM | POA: Diagnosis not present

## 2018-03-02 DIAGNOSIS — F329 Major depressive disorder, single episode, unspecified: Secondary | ICD-10-CM | POA: Diagnosis not present

## 2018-03-02 LAB — CBC WITH DIFFERENTIAL (CANCER CENTER ONLY)
Basophils Absolute: 0 10*3/uL (ref 0.0–0.1)
Basophils Relative: 1 %
Eosinophils Absolute: 0 10*3/uL (ref 0.0–0.5)
Eosinophils Relative: 1 %
HEMATOCRIT: 30.7 % — AB (ref 34.8–46.6)
Hemoglobin: 9.8 g/dL — ABNORMAL LOW (ref 11.6–15.9)
LYMPHS PCT: 36 %
Lymphs Abs: 1.1 10*3/uL (ref 0.9–3.3)
MCH: 28.1 pg (ref 25.1–34.0)
MCHC: 31.9 g/dL (ref 31.5–36.0)
MCV: 88.3 fL (ref 79.5–101.0)
MONO ABS: 0.2 10*3/uL (ref 0.1–0.9)
MONOS PCT: 8 %
NEUTROS ABS: 1.6 10*3/uL (ref 1.5–6.5)
Neutrophils Relative %: 54 %
Platelet Count: 224 10*3/uL (ref 145–400)
RBC: 3.48 MIL/uL — ABNORMAL LOW (ref 3.70–5.45)
RDW: 18.6 % — AB (ref 11.2–14.5)
WBC Count: 2.9 10*3/uL — ABNORMAL LOW (ref 3.9–10.3)

## 2018-03-02 MED ORDER — SODIUM CHLORIDE 0.9% FLUSH
10.0000 mL | INTRAVENOUS | Status: DC | PRN
  Administered 2018-03-02: 10 mL
  Filled 2018-03-02: qty 10

## 2018-03-02 MED ORDER — SODIUM CHLORIDE 0.9 % IV SOLN
300.0000 mg/m2 | Freq: Once | INTRAVENOUS | Status: AC
  Administered 2018-03-02: 560 mg via INTRAVENOUS
  Filled 2018-03-02: qty 28

## 2018-03-02 MED ORDER — DEXTROSE 5 % IV SOLN
20.0000 mg/m2 | Freq: Once | INTRAVENOUS | Status: AC
  Administered 2018-03-02: 38 mg via INTRAVENOUS
  Filled 2018-03-02: qty 15

## 2018-03-02 MED ORDER — DEXAMETHASONE 4 MG PO TABS: ORAL_TABLET | ORAL | 3 refills | Status: DC

## 2018-03-02 MED ORDER — PALONOSETRON HCL INJECTION 0.25 MG/5ML
INTRAVENOUS | Status: AC
  Filled 2018-03-02: qty 5

## 2018-03-02 MED ORDER — HEPARIN SOD (PORK) LOCK FLUSH 100 UNIT/ML IV SOLN
500.0000 [IU] | Freq: Once | INTRAVENOUS | Status: AC | PRN
  Administered 2018-03-02: 500 [IU]
  Filled 2018-03-02: qty 5

## 2018-03-02 MED ORDER — PALONOSETRON HCL INJECTION 0.25 MG/5ML
0.2500 mg | Freq: Once | INTRAVENOUS | Status: AC
  Administered 2018-03-02: 0.25 mg via INTRAVENOUS

## 2018-03-02 MED ORDER — SODIUM CHLORIDE 0.9 % IV SOLN
Freq: Once | INTRAVENOUS | Status: AC
  Administered 2018-03-02: 10:00:00 via INTRAVENOUS
  Filled 2018-03-02: qty 250

## 2018-03-02 NOTE — Progress Notes (Signed)
Ok to treat with CMP from 8/22 per Dr. Benay Spice.

## 2018-03-02 NOTE — Patient Instructions (Addendum)
[facility] Discharge Instructions for Patients Receiving Chemotherapy  Today you received the following chemotherapy agents Cytoxan,Kyprolis To help prevent nausea and vomiting after your treatment, we encourage you to take your nausea medication as directed If you develop nausea and vomiting that is not controlled by your nausea medication, call the clinic.   BELOW ARE SYMPTOMS THAT SHOULD BE REPORTED IMMEDIATELY:  *FEVER GREATER THAN 100.5 F  *CHILLS WITH OR WITHOUT FEVER  NAUSEA AND VOMITING THAT IS NOT CONTROLLED WITH YOUR NAUSEA MEDICATION  *UNUSUAL SHORTNESS OF BREATH  *UNUSUAL BRUISING OR BLEEDING  TENDERNESS IN MOUTH AND THROAT WITH OR WITHOUT PRESENCE OF ULCERS  *URINARY PROBLEMS  *BOWEL PROBLEMS  UNUSUAL RASH Items with * indicate a potential emergency and should be followed up as soon as possible.  Feel free to call the clinic should you have any questions or concerns. The clinic phone number is [phone].  Please show the [facility] at check-in to the Emergency Department and triage nurse.    Cyclophosphamide injection What is this medicine? CYCLOPHOSPHAMIDE (sye kloe FOSS fa mide) is a chemotherapy drug. It slows the growth of cancer cells. This medicine is used to treat many types of cancer like lymphoma, myeloma, leukemia, breast cancer, and ovarian cancer, to name a few. This medicine may be used for other purposes; ask your health care provider or pharmacist if you have questions. COMMON BRAND NAME(S): Cytoxan, Neosar What should I tell my health care provider before I take this medicine? They need to know if you have any of these conditions: -blood disorders -history of other chemotherapy -infection -kidney disease -liver disease -recent or ongoing radiation therapy -tumors in the bone marrow -an unusual or allergic reaction to cyclophosphamide, other chemotherapy, other medicines, foods, dyes, or preservatives -pregnant or  trying to get pregnant -breast-feeding How should I use this medicine? This drug is usually given as an injection into a vein or muscle or by infusion into a vein. It is administered in a hospital or clinic by a specially trained health care professional. Talk to your pediatrician regarding the use of this medicine in children. Special care may be needed. Overdosage: If you think you have taken too much of this medicine contact a poison control center or emergency room at once. NOTE: This medicine is only for you. Do not share this medicine with others. What if I miss a dose? It is important not to miss your dose. Call your doctor or health care professional if you are unable to keep an appointment. What may interact with this medicine? This medicine may interact with the following medications: -amiodarone -amphotericin B -azathioprine -certain antiviral medicines for HIV or AIDS such as protease inhibitors (e.g., indinavir, ritonavir) and zidovudine -certain blood pressure medications such as benazepril, captopril, enalapril, fosinopril, lisinopril, moexipril, monopril, perindopril, quinapril, ramipril, trandolapril -certain cancer medications such as anthracyclines (e.g., daunorubicin, doxorubicin), busulfan, cytarabine, paclitaxel, pentostatin, tamoxifen, trastuzumab -certain diuretics such as chlorothiazide, chlorthalidone, hydrochlorothiazide, indapamide, metolazone -certain medicines that treat or prevent blood clots like warfarin -certain muscle relaxants such as succinylcholine -cyclosporine -etanercept -indomethacin -medicines to increase blood counts like filgrastim, pegfilgrastim, sargramostim -medicines used as general anesthesia -metronidazole -natalizumab This list may not describe all possible interactions. Give your health care provider a list of all the medicines, herbs, non-prescription drugs, or dietary supplements you use. Also tell them if you smoke, drink alcohol, or  use illegal drugs. Some items may interact with your medicine. What should I watch for while using  this medicine? Visit your doctor for checks on your progress. This drug may make you feel generally unwell. This is not uncommon, as chemotherapy can affect healthy cells as well as cancer cells. Report any side effects. Continue your course of treatment even though you feel ill unless your doctor tells you to stop. Drink water or other fluids as directed. Urinate often, even at night. In some cases, you may be given additional medicines to help with side effects. Follow all directions for their use. Call your doctor or health care professional for advice if you get a fever, chills or sore throat, or other symptoms of a cold or flu. Do not treat yourself. This drug decreases your body's ability to fight infections. Try to avoid being around people who are sick. This medicine may increase your risk to bruise or bleed. Call your doctor or health care professional if you notice any unusual bleeding. Be careful brushing and flossing your teeth or using a toothpick because you may get an infection or bleed more easily. If you have any dental work done, tell your dentist you are receiving this medicine. You may get drowsy or dizzy. Do not drive, use machinery, or do anything that needs mental alertness until you know how this medicine affects you. Do not become pregnant while taking this medicine or for 1 year after stopping it. Women should inform their doctor if they wish to become pregnant or think they might be pregnant. Men should not father a child while taking this medicine and for 4 months after stopping it. There is a potential for serious side effects to an unborn child. Talk to your health care professional or pharmacist for more information. Do not breast-feed an infant while taking this medicine. This medicine may interfere with the ability to have a child. This medicine has caused ovarian failure in  some women. This medicine has caused reduced sperm counts in some men. You should talk with your doctor or health care professional if you are concerned about your fertility. If you are going to have surgery, tell your doctor or health care professional that you have taken this medicine. What side effects may I notice from receiving this medicine? Side effects that you should report to your doctor or health care professional as soon as possible: -allergic reactions like skin rash, itching or hives, swelling of the face, lips, or tongue -low blood counts - this medicine may decrease the number of white blood cells, red blood cells and platelets. You may be at increased risk for infections and bleeding. -signs of infection - fever or chills, cough, sore throat, pain or difficulty passing urine -signs of decreased platelets or bleeding - bruising, pinpoint red spots on the skin, black, tarry stools, blood in the urine -signs of decreased red blood cells - unusually weak or tired, fainting spells, lightheadedness -breathing problems -dark urine -dizziness -palpitations -swelling of the ankles, feet, hands -trouble passing urine or change in the amount of urine -weight gain -yellowing of the eyes or skin Side effects that usually do not require medical attention (report to your doctor or health care professional if they continue or are bothersome): -changes in nail or skin color -hair loss -missed menstrual periods -mouth sores -nausea, vomiting This list may not describe all possible side effects. Call your doctor for medical advice about side effects. You may report side effects to FDA at 1-800-FDA-1088. Where should I keep my medicine? This drug is given in a hospital or clinic and  will not be stored at home. NOTE: This sheet is a summary. It may not cover all possible information. If you have questions about this medicine, talk to your doctor, pharmacist, or health care provider.  2018  Elsevier/Gold Standard (2012-04-30 16:22:58)   Carfilzomib injection What is this medicine? CARFILZOMIB (kar FILZ oh mib) targets a specific protein within cancer cells and stops the cancer cells from growing. It is used to treat multiple myeloma. This medicine may be used for other purposes; ask your health care provider or pharmacist if you have questions. COMMON BRAND NAME(S): KYPROLIS What should I tell my health care provider before I take this medicine? They need to know if you have any of these conditions: -heart disease -history of blood clots -irregular heartbeat -kidney disease -liver disease -lung or breathing disease -an unusual or allergic reaction to carfilzomib, or other medicines, foods, dyes, or preservatives -pregnant or trying to get pregnant -breast-feeding How should I use this medicine? This medicine is for injection or infusion into a vein. It is given by a health care professional in a hospital or clinic setting. Talk to your pediatrician regarding the use of this medicine in children. Special care may be needed. Overdosage: If you think you have taken too much of this medicine contact a poison control center or emergency room at once. NOTE: This medicine is only for you. Do not share this medicine with others. What if I miss a dose? It is important not to miss your dose. Call your doctor or health care professional if you are unable to keep an appointment. What may interact with this medicine? Interactions are not expected. Give your health care provider a list of all the medicines, herbs, non-prescription drugs, or dietary supplements you use. Also tell them if you smoke, drink alcohol, or use illegal drugs. Some items may interact with your medicine. This list may not describe all possible interactions. Give your health care provider a list of all the medicines, herbs, non-prescription drugs, or dietary supplements you use. Also tell them if you smoke, drink  alcohol, or use illegal drugs. Some items may interact with your medicine. What should I watch for while using this medicine? Your condition will be monitored carefully while you are receiving this medicine. Report any side effects. Continue your course of treatment even though you feel ill unless your doctor tells you to stop. You may need blood work done while you are taking this medicine. Do not become pregnant while taking this medicine or for at least 30 days after stopping it. Women should inform their doctor if they wish to become pregnant or think they might be pregnant. There is a potential for serious side effects to an unborn child. Men should not father a child while taking this medicine and for 90 days after stopping it. Talk to your health care professional or pharmacist for more information. Do not breast-feed an infant while taking this medicine. Check with your doctor or health care professional if you get an attack of severe diarrhea, nausea and vomiting, or if you sweat a lot. The loss of too much body fluid can make it dangerous for you to take this medicine. You may get dizzy. Do not drive, use machinery, or do anything that needs mental alertness until you know how this medicine affects you. Do not stand or sit up quickly, especially if you are an older patient. This reduces the risk of dizzy or fainting spells. What side effects may I notice from  receiving this medicine? Side effects that you should report to your doctor or health care professional as soon as possible: -allergic reactions like skin rash, itching or hives, swelling of the face, lips, or tongue -confusion -dizziness -feeling faint or lightheaded -fever or chills -palpitations -seizures -signs and symptoms of bleeding such as bloody or black, tarry stools; red or dark-brown urine; spitting up blood or brown material that looks like coffee grounds; red spots on the skin; unusual bruising or bleeding including from  the eye, gums, or nose -signs and symptoms of a blood clot such as breathing problems; changes in vision; chest pain; severe, sudden headache; pain, swelling, warmth in the leg; trouble speaking; sudden numbness or weakness of the face, arm or leg -signs and symptoms of kidney injury like trouble passing urine or change in the amount of urine -signs and symptoms of liver injury like dark yellow or brown urine; general ill feeling or flu-like symptoms; light-colored stools; loss of appetite; nausea; right upper belly pain; unusually weak or tired; yellowing of the eyes or skin Side effects that usually do not require medical attention (report to your doctor or health care professional if they continue or are bothersome): -back pain -cough -diarrhea -headache -muscle cramps -vomiting This list may not describe all possible side effects. Call your doctor for medical advice about side effects. You may report side effects to FDA at 1-800-FDA-1088. Where should I keep my medicine? This drug is given in a hospital or clinic and will not be stored at home. NOTE: This sheet is a summary. It may not cover all possible information. If you have questions about this medicine, talk to your doctor, pharmacist, or health care provider.  2018 Elsevier/Gold Standard (2015-07-19 13:39:23).

## 2018-03-03 ENCOUNTER — Inpatient Hospital Stay: Payer: PPO

## 2018-03-03 VITALS — BP 121/79 | HR 83 | Temp 99.4°F | Resp 14

## 2018-03-03 DIAGNOSIS — C9000 Multiple myeloma not having achieved remission: Secondary | ICD-10-CM

## 2018-03-03 DIAGNOSIS — Z5111 Encounter for antineoplastic chemotherapy: Secondary | ICD-10-CM | POA: Diagnosis not present

## 2018-03-03 LAB — IGA: IgA: 392 mg/dL — ABNORMAL HIGH (ref 87–352)

## 2018-03-03 MED ORDER — SODIUM CHLORIDE 0.9 % IV SOLN
Freq: Once | INTRAVENOUS | Status: AC
  Administered 2018-03-03: 09:00:00 via INTRAVENOUS
  Filled 2018-03-03: qty 250

## 2018-03-03 MED ORDER — DEXTROSE 5 % IV SOLN
20.0000 mg/m2 | Freq: Once | INTRAVENOUS | Status: AC
  Administered 2018-03-03: 38 mg via INTRAVENOUS
  Filled 2018-03-03: qty 14

## 2018-03-03 MED ORDER — SODIUM CHLORIDE 0.9% FLUSH
10.0000 mL | INTRAVENOUS | Status: DC | PRN
  Administered 2018-03-03: 10 mL
  Filled 2018-03-03: qty 10

## 2018-03-03 MED ORDER — ALTEPLASE 2 MG IJ SOLR
2.0000 mg | Freq: Once | INTRAMUSCULAR | Status: DC | PRN
  Filled 2018-03-03: qty 2

## 2018-03-03 MED ORDER — HEPARIN SOD (PORK) LOCK FLUSH 100 UNIT/ML IV SOLN
500.0000 [IU] | Freq: Once | INTRAVENOUS | Status: AC | PRN
  Administered 2018-03-03: 500 [IU]
  Filled 2018-03-03: qty 5

## 2018-03-03 NOTE — Patient Instructions (Signed)
[facility] Discharge Instructions for Patients Receiving Chemotherapy  Today you received the following chemotherapy agents Cytoxan,Kyprolis To help prevent nausea and vomiting after your treatment, we encourage you to take your nausea medication as directed If you develop nausea and vomiting that is not controlled by your nausea medication, call the clinic.   BELOW ARE SYMPTOMS THAT SHOULD BE REPORTED IMMEDIATELY:  *FEVER GREATER THAN 100.5 F  *CHILLS WITH OR WITHOUT FEVER  NAUSEA AND VOMITING THAT IS NOT CONTROLLED WITH YOUR NAUSEA MEDICATION  *UNUSUAL SHORTNESS OF BREATH  *UNUSUAL BRUISING OR BLEEDING  TENDERNESS IN MOUTH AND THROAT WITH OR WITHOUT PRESENCE OF ULCERS  *URINARY PROBLEMS  *BOWEL PROBLEMS  UNUSUAL RASH Items with * indicate a potential emergency and should be followed up as soon as possible.  Feel free to call the clinic should you have any questions or concerns. The clinic phone number is [phone].  Please show the CHEMO ALERT CARD at check-in to the Emergency Department and triage nurse.    Cyclophosphamide injection What is this medicine? CYCLOPHOSPHAMIDE (sye kloe FOSS fa mide) is a chemotherapy drug. It slows the growth of cancer cells. This medicine is used to treat many types of cancer like lymphoma, myeloma, leukemia, breast cancer, and ovarian cancer, to name a few. This medicine may be used for other purposes; ask your health care provider or pharmacist if you have questions. COMMON BRAND NAME(S): Cytoxan, Neosar What should I tell my health care provider before I take this medicine? They need to know if you have any of these conditions: -blood disorders -history of other chemotherapy -infection -kidney disease -liver disease -recent or ongoing radiation therapy -tumors in the bone marrow -an unusual or allergic reaction to cyclophosphamide, other chemotherapy, other medicines, foods, dyes, or preservatives -pregnant or  trying to get pregnant -breast-feeding How should I use this medicine? This drug is usually given as an injection into a vein or muscle or by infusion into a vein. It is administered in a hospital or clinic by a specially trained health care professional. Talk to your pediatrician regarding the use of this medicine in children. Special care may be needed. Overdosage: If you think you have taken too much of this medicine contact a poison control center or emergency room at once. NOTE: This medicine is only for you. Do not share this medicine with others. What if I miss a dose? It is important not to miss your dose. Call your doctor or health care professional if you are unable to keep an appointment. What may interact with this medicine? This medicine may interact with the following medications: -amiodarone -amphotericin B -azathioprine -certain antiviral medicines for HIV or AIDS such as protease inhibitors (e.g., indinavir, ritonavir) and zidovudine -certain blood pressure medications such as benazepril, captopril, enalapril, fosinopril, lisinopril, moexipril, monopril, perindopril, quinapril, ramipril, trandolapril -certain cancer medications such as anthracyclines (e.g., daunorubicin, doxorubicin), busulfan, cytarabine, paclitaxel, pentostatin, tamoxifen, trastuzumab -certain diuretics such as chlorothiazide, chlorthalidone, hydrochlorothiazide, indapamide, metolazone -certain medicines that treat or prevent blood clots like warfarin -certain muscle relaxants such as succinylcholine -cyclosporine -etanercept -indomethacin -medicines to increase blood counts like filgrastim, pegfilgrastim, sargramostim -medicines used as general anesthesia -metronidazole -natalizumab This list may not describe all possible interactions. Give your health care provider a list of all the medicines, herbs, non-prescription drugs, or dietary supplements you use. Also tell them if you smoke, drink alcohol, or  use illegal drugs. Some items may interact with your medicine. What should I watch for while using   this medicine? Visit your doctor for checks on your progress. This drug may make you feel generally unwell. This is not uncommon, as chemotherapy can affect healthy cells as well as cancer cells. Report any side effects. Continue your course of treatment even though you feel ill unless your doctor tells you to stop. Drink water or other fluids as directed. Urinate often, even at night. In some cases, you may be given additional medicines to help with side effects. Follow all directions for their use. Call your doctor or health care professional for advice if you get a fever, chills or sore throat, or other symptoms of a cold or flu. Do not treat yourself. This drug decreases your body's ability to fight infections. Try to avoid being around people who are sick. This medicine may increase your risk to bruise or bleed. Call your doctor or health care professional if you notice any unusual bleeding. Be careful brushing and flossing your teeth or using a toothpick because you may get an infection or bleed more easily. If you have any dental work done, tell your dentist you are receiving this medicine. You may get drowsy or dizzy. Do not drive, use machinery, or do anything that needs mental alertness until you know how this medicine affects you. Do not become pregnant while taking this medicine or for 1 year after stopping it. Women should inform their doctor if they wish to become pregnant or think they might be pregnant. Men should not father a child while taking this medicine and for 4 months after stopping it. There is a potential for serious side effects to an unborn child. Talk to your health care professional or pharmacist for more information. Do not breast-feed an infant while taking this medicine. This medicine may interfere with the ability to have a child. This medicine has caused ovarian failure in  some women. This medicine has caused reduced sperm counts in some men. You should talk with your doctor or health care professional if you are concerned about your fertility. If you are going to have surgery, tell your doctor or health care professional that you have taken this medicine. What side effects may I notice from receiving this medicine? Side effects that you should report to your doctor or health care professional as soon as possible: -allergic reactions like skin rash, itching or hives, swelling of the face, lips, or tongue -low blood counts - this medicine may decrease the number of white blood cells, red blood cells and platelets. You may be at increased risk for infections and bleeding. -signs of infection - fever or chills, cough, sore throat, pain or difficulty passing urine -signs of decreased platelets or bleeding - bruising, pinpoint red spots on the skin, black, tarry stools, blood in the urine -signs of decreased red blood cells - unusually weak or tired, fainting spells, lightheadedness -breathing problems -dark urine -dizziness -palpitations -swelling of the ankles, feet, hands -trouble passing urine or change in the amount of urine -weight gain -yellowing of the eyes or skin Side effects that usually do not require medical attention (report to your doctor or health care professional if they continue or are bothersome): -changes in nail or skin color -hair loss -missed menstrual periods -mouth sores -nausea, vomiting This list may not describe all possible side effects. Call your doctor for medical advice about side effects. You may report side effects to FDA at 1-800-FDA-1088. Where should I keep my medicine? This drug is given in a hospital or clinic and   will not be stored at home. NOTE: This sheet is a summary. It may not cover all possible information. If you have questions about this medicine, talk to your doctor, pharmacist, or health care provider.  2018  Elsevier/Gold Standard (2012-04-30 16:22:58)   Carfilzomib injection What is this medicine? CARFILZOMIB (kar FILZ oh mib) targets a specific protein within cancer cells and stops the cancer cells from growing. It is used to treat multiple myeloma. This medicine may be used for other purposes; ask your health care provider or pharmacist if you have questions. COMMON BRAND NAME(S): KYPROLIS What should I tell my health care provider before I take this medicine? They need to know if you have any of these conditions: -heart disease -history of blood clots -irregular heartbeat -kidney disease -liver disease -lung or breathing disease -an unusual or allergic reaction to carfilzomib, or other medicines, foods, dyes, or preservatives -pregnant or trying to get pregnant -breast-feeding How should I use this medicine? This medicine is for injection or infusion into a vein. It is given by a health care professional in a hospital or clinic setting. Talk to your pediatrician regarding the use of this medicine in children. Special care may be needed. Overdosage: If you think you have taken too much of this medicine contact a poison control center or emergency room at once. NOTE: This medicine is only for you. Do not share this medicine with others. What if I miss a dose? It is important not to miss your dose. Call your doctor or health care professional if you are unable to keep an appointment. What may interact with this medicine? Interactions are not expected. Give your health care provider a list of all the medicines, herbs, non-prescription drugs, or dietary supplements you use. Also tell them if you smoke, drink alcohol, or use illegal drugs. Some items may interact with your medicine. This list may not describe all possible interactions. Give your health care provider a list of all the medicines, herbs, non-prescription drugs, or dietary supplements you use. Also tell them if you smoke, drink  alcohol, or use illegal drugs. Some items may interact with your medicine. What should I watch for while using this medicine? Your condition will be monitored carefully while you are receiving this medicine. Report any side effects. Continue your course of treatment even though you feel ill unless your doctor tells you to stop. You may need blood work done while you are taking this medicine. Do not become pregnant while taking this medicine or for at least 30 days after stopping it. Women should inform their doctor if they wish to become pregnant or think they might be pregnant. There is a potential for serious side effects to an unborn child. Men should not father a child while taking this medicine and for 90 days after stopping it. Talk to your health care professional or pharmacist for more information. Do not breast-feed an infant while taking this medicine. Check with your doctor or health care professional if you get an attack of severe diarrhea, nausea and vomiting, or if you sweat a lot. The loss of too much body fluid can make it dangerous for you to take this medicine. You may get dizzy. Do not drive, use machinery, or do anything that needs mental alertness until you know how this medicine affects you. Do not stand or sit up quickly, especially if you are an older patient. This reduces the risk of dizzy or fainting spells. What side effects may I notice from   receiving this medicine? Side effects that you should report to your doctor or health care professional as soon as possible: -allergic reactions like skin rash, itching or hives, swelling of the face, lips, or tongue -confusion -dizziness -feeling faint or lightheaded -fever or chills -palpitations -seizures -signs and symptoms of bleeding such as bloody or black, tarry stools; red or dark-brown urine; spitting up blood or brown material that looks like coffee grounds; red spots on the skin; unusual bruising or bleeding including from  the eye, gums, or nose -signs and symptoms of a blood clot such as breathing problems; changes in vision; chest pain; severe, sudden headache; pain, swelling, warmth in the leg; trouble speaking; sudden numbness or weakness of the face, arm or leg -signs and symptoms of kidney injury like trouble passing urine or change in the amount of urine -signs and symptoms of liver injury like dark yellow or brown urine; general ill feeling or flu-like symptoms; light-colored stools; loss of appetite; nausea; right upper belly pain; unusually weak or tired; yellowing of the eyes or skin Side effects that usually do not require medical attention (report to your doctor or health care professional if they continue or are bothersome): -back pain -cough -diarrhea -headache -muscle cramps -vomiting This list may not describe all possible side effects. Call your doctor for medical advice about side effects. You may report side effects to FDA at 1-800-FDA-1088. Where should I keep my medicine? This drug is given in a hospital or clinic and will not be stored at home. NOTE: This sheet is a summary. It may not cover all possible information. If you have questions about this medicine, talk to your doctor, pharmacist, or health care provider.  2018 Elsevier/Gold Standard (2015-07-19 13:39:23).   

## 2018-03-06 ENCOUNTER — Other Ambulatory Visit: Payer: Self-pay | Admitting: Oncology

## 2018-03-08 ENCOUNTER — Inpatient Hospital Stay: Payer: PPO

## 2018-03-08 DIAGNOSIS — Z95828 Presence of other vascular implants and grafts: Secondary | ICD-10-CM

## 2018-03-08 DIAGNOSIS — Z5111 Encounter for antineoplastic chemotherapy: Secondary | ICD-10-CM | POA: Diagnosis not present

## 2018-03-08 DIAGNOSIS — C9000 Multiple myeloma not having achieved remission: Secondary | ICD-10-CM

## 2018-03-08 LAB — CBC WITH DIFFERENTIAL (CANCER CENTER ONLY)
BASOS ABS: 0 10*3/uL (ref 0.0–0.1)
Basophils Relative: 0 %
EOS ABS: 0 10*3/uL (ref 0.0–0.5)
EOS PCT: 0 %
HCT: 32.7 % — ABNORMAL LOW (ref 34.8–46.6)
Hemoglobin: 10.1 g/dL — ABNORMAL LOW (ref 11.6–15.9)
LYMPHS PCT: 43 %
Lymphs Abs: 1.9 10*3/uL (ref 0.9–3.3)
MCH: 27.4 pg (ref 25.1–34.0)
MCHC: 30.9 g/dL — ABNORMAL LOW (ref 31.5–36.0)
MCV: 88.9 fL (ref 79.5–101.0)
Monocytes Absolute: 0.5 10*3/uL (ref 0.1–0.9)
Monocytes Relative: 12 %
Neutro Abs: 2 10*3/uL (ref 1.5–6.5)
Neutrophils Relative %: 45 %
PLATELETS: 176 10*3/uL (ref 145–400)
RBC: 3.68 MIL/uL — ABNORMAL LOW (ref 3.70–5.45)
RDW: 17.1 % — AB (ref 11.2–14.5)
WBC Count: 4.4 10*3/uL (ref 3.9–10.3)

## 2018-03-08 MED ORDER — HEPARIN SOD (PORK) LOCK FLUSH 100 UNIT/ML IV SOLN
500.0000 [IU] | Freq: Once | INTRAVENOUS | Status: AC
  Administered 2018-03-08: 500 [IU] via INTRAVENOUS
  Filled 2018-03-08: qty 5

## 2018-03-08 MED ORDER — SODIUM CHLORIDE 0.9% FLUSH
10.0000 mL | INTRAVENOUS | Status: DC | PRN
  Administered 2018-03-08: 10 mL via INTRAVENOUS
  Filled 2018-03-08: qty 10

## 2018-03-08 NOTE — Patient Instructions (Signed)
Implanted Port Home Guide An implanted port is a type of central line that is placed under the skin. Central lines are used to provide IV access when treatment or nutrition needs to be given through a person's veins. Implanted ports are used for long-term IV access. An implanted port may be placed because:  You need IV medicine that would be irritating to the small veins in your hands or arms.  You need long-term IV medicines, such as antibiotics.  You need IV nutrition for a long period.  You need frequent blood draws for lab tests.  You need dialysis.  Implanted ports are usually placed in the chest area, but they can also be placed in the upper arm, the abdomen, or the leg. An implanted port has two main parts:  Reservoir. The reservoir is round and will appear as a small, raised area under your skin. The reservoir is the part where a needle is inserted to give medicines or draw blood.  Catheter. The catheter is a thin, flexible tube that extends from the reservoir. The catheter is placed into a large vein. Medicine that is inserted into the reservoir goes into the catheter and then into the vein.  How will I care for my incision site? Do not get the incision site wet. Bathe or shower as directed by your health care provider. How is my port accessed? Special steps must be taken to access the port:  Before the port is accessed, a numbing cream can be placed on the skin. This helps numb the skin over the port site.  Your health care provider uses a sterile technique to access the port. ? Your health care provider must put on a mask and sterile gloves. ? The skin over your port is cleaned carefully with an antiseptic and allowed to dry. ? The port is gently pinched between sterile gloves, and a needle is inserted into the port.  Only "non-coring" port needles should be used to access the port. Once the port is accessed, a blood return should be checked. This helps ensure that the port  is in the vein and is not clogged.  If your port needs to remain accessed for a constant infusion, a clear (transparent) bandage will be placed over the needle site. The bandage and needle will need to be changed every week, or as directed by your health care provider.  Keep the bandage covering the needle clean and dry. Do not get it wet. Follow your health care provider's instructions on how to take a shower or bath while the port is accessed.  If your port does not need to stay accessed, no bandage is needed over the port.  What is flushing? Flushing helps keep the port from getting clogged. Follow your health care provider's instructions on how and when to flush the port. Ports are usually flushed with saline solution or a medicine called heparin. The need for flushing will depend on how the port is used.  If the port is used for intermittent medicines or blood draws, the port will need to be flushed: ? After medicines have been given. ? After blood has been drawn. ? As part of routine maintenance.  If a constant infusion is running, the port may not need to be flushed.  How long will my port stay implanted? The port can stay in for as long as your health care provider thinks it is needed. When it is time for the port to come out, surgery will be   done to remove it. The procedure is similar to the one performed when the port was put in. When should I seek immediate medical care? When you have an implanted port, you should seek immediate medical care if:  You notice a bad smell coming from the incision site.  You have swelling, redness, or drainage at the incision site.  You have more swelling or pain at the port site or the surrounding area.  You have a fever that is not controlled with medicine.  This information is not intended to replace advice given to you by your health care provider. Make sure you discuss any questions you have with your health care provider. Document  Released: 06/16/2005 Document Revised: 11/22/2015 Document Reviewed: 02/21/2013 Elsevier Interactive Patient Education  2017 Elsevier Inc.  

## 2018-03-09 ENCOUNTER — Inpatient Hospital Stay: Payer: PPO

## 2018-03-09 ENCOUNTER — Ambulatory Visit: Payer: Self-pay

## 2018-03-09 ENCOUNTER — Other Ambulatory Visit: Payer: Self-pay

## 2018-03-09 VITALS — BP 109/49 | HR 90 | Temp 97.8°F | Resp 18

## 2018-03-09 DIAGNOSIS — C9000 Multiple myeloma not having achieved remission: Secondary | ICD-10-CM

## 2018-03-09 DIAGNOSIS — Z5111 Encounter for antineoplastic chemotherapy: Secondary | ICD-10-CM | POA: Diagnosis not present

## 2018-03-09 MED ORDER — PALONOSETRON HCL INJECTION 0.25 MG/5ML
INTRAVENOUS | Status: AC
  Filled 2018-03-09: qty 5

## 2018-03-09 MED ORDER — SODIUM CHLORIDE 0.9% FLUSH
10.0000 mL | INTRAVENOUS | Status: DC | PRN
  Administered 2018-03-09: 10 mL
  Filled 2018-03-09: qty 10

## 2018-03-09 MED ORDER — HEPARIN SOD (PORK) LOCK FLUSH 100 UNIT/ML IV SOLN
500.0000 [IU] | Freq: Once | INTRAVENOUS | Status: AC | PRN
  Administered 2018-03-09: 500 [IU]
  Filled 2018-03-09: qty 5

## 2018-03-09 MED ORDER — SODIUM CHLORIDE 0.9 % IV SOLN
300.0000 mg/m2 | Freq: Once | INTRAVENOUS | Status: AC
  Administered 2018-03-09: 560 mg via INTRAVENOUS
  Filled 2018-03-09: qty 28

## 2018-03-09 MED ORDER — SODIUM CHLORIDE 0.9 % IV SOLN
Freq: Once | INTRAVENOUS | Status: AC
  Administered 2018-03-09: 09:00:00 via INTRAVENOUS
  Filled 2018-03-09: qty 250

## 2018-03-09 MED ORDER — DEXTROSE 5 % IV SOLN
70.0000 mg | Freq: Once | INTRAVENOUS | Status: AC
  Administered 2018-03-09: 70 mg via INTRAVENOUS
  Filled 2018-03-09: qty 30

## 2018-03-09 MED ORDER — PALONOSETRON HCL INJECTION 0.25 MG/5ML
0.2500 mg | Freq: Once | INTRAVENOUS | Status: AC
  Administered 2018-03-09: 0.25 mg via INTRAVENOUS

## 2018-03-09 NOTE — Patient Instructions (Signed)
[facility] °Discharge Instructions for Patients Receiving Chemotherapy ° °Today you received the following chemotherapy agents: Cytoxan, Kyprolis ° °To help prevent nausea and vomiting after your treatment, we encourage you to take your nausea medication as directed °  °If you develop nausea and vomiting that is not controlled by your nausea medication, call the clinic.  ° °BELOW ARE SYMPTOMS THAT SHOULD BE REPORTED IMMEDIATELY: °· *FEVER GREATER THAN 100.5 F °· *CHILLS WITH OR WITHOUT FEVER °· NAUSEA AND VOMITING THAT IS NOT CONTROLLED WITH YOUR NAUSEA MEDICATION °· *UNUSUAL SHORTNESS OF BREATH °· *UNUSUAL BRUISING OR BLEEDING °· TENDERNESS IN MOUTH AND THROAT WITH OR WITHOUT PRESENCE OF ULCERS °· *URINARY PROBLEMS °· *BOWEL PROBLEMS °· UNUSUAL RASH °Items with * indicate a potential emergency and should be followed up as soon as possible. ° °Feel free to call the clinic should you have any questions or concerns. The clinic phone number is [phone]. ° °Please show the CHEMO ALERT CARD at check-in to the Emergency Department and triage nurse. ° ° °

## 2018-03-09 NOTE — Progress Notes (Signed)
Ok to treat with only CBC per Dr. Benay Spice.

## 2018-03-10 ENCOUNTER — Inpatient Hospital Stay: Payer: PPO

## 2018-03-10 VITALS — BP 125/74 | HR 84 | Temp 98.9°F | Resp 16

## 2018-03-10 DIAGNOSIS — Z5111 Encounter for antineoplastic chemotherapy: Secondary | ICD-10-CM | POA: Diagnosis not present

## 2018-03-10 DIAGNOSIS — C9000 Multiple myeloma not having achieved remission: Secondary | ICD-10-CM

## 2018-03-10 MED ORDER — DEXTROSE 5 % IV SOLN
70.0000 mg | Freq: Once | INTRAVENOUS | Status: AC
  Administered 2018-03-10: 70 mg via INTRAVENOUS
  Filled 2018-03-10: qty 30

## 2018-03-10 MED ORDER — SODIUM CHLORIDE 0.9 % IV SOLN
Freq: Once | INTRAVENOUS | Status: AC
  Filled 2018-03-10: qty 250

## 2018-03-10 MED ORDER — SODIUM CHLORIDE 0.9 % IV SOLN
Freq: Once | INTRAVENOUS | Status: AC
  Administered 2018-03-10: 10:00:00 via INTRAVENOUS
  Filled 2018-03-10: qty 250

## 2018-03-10 MED ORDER — HEPARIN SOD (PORK) LOCK FLUSH 100 UNIT/ML IV SOLN
500.0000 [IU] | Freq: Once | INTRAVENOUS | Status: AC | PRN
  Administered 2018-03-10: 500 [IU]
  Filled 2018-03-10: qty 5

## 2018-03-10 MED ORDER — SODIUM CHLORIDE 0.9% FLUSH
10.0000 mL | INTRAVENOUS | Status: DC | PRN
  Administered 2018-03-10: 10 mL
  Filled 2018-03-10: qty 10

## 2018-03-10 NOTE — Patient Instructions (Signed)
[facility] Discharge Instructions for Patients Receiving Chemotherapy  Today you received the following chemotherapy agents:  Kyprolis  To help prevent nausea and vomiting after your treatment, we encourage you to take your nausea medication as directed.   If you develop nausea and vomiting that is not controlled by your nausea medication, call the clinic.   BELOW ARE SYMPTOMS THAT SHOULD BE REPORTED IMMEDIATELY:  *FEVER GREATER THAN 100.5 F  *CHILLS WITH OR WITHOUT FEVER  NAUSEA AND VOMITING THAT IS NOT CONTROLLED WITH YOUR NAUSEA MEDICATION  *UNUSUAL SHORTNESS OF BREATH  *UNUSUAL BRUISING OR BLEEDING  TENDERNESS IN MOUTH AND THROAT WITH OR WITHOUT PRESENCE OF ULCERS  *URINARY PROBLEMS  *BOWEL PROBLEMS  UNUSUAL RASH Items with * indicate a potential emergency and should be followed up as soon as possible.  Feel free to call the clinic should you have any questions or concerns. The clinic phone number is [phone].  Please show the CHEMO ALERT CARD at check-in to the Emergency Department and triage nurse.   

## 2018-03-11 ENCOUNTER — Ambulatory Visit (HOSPITAL_COMMUNITY): Payer: PPO

## 2018-03-11 ENCOUNTER — Other Ambulatory Visit (HOSPITAL_COMMUNITY): Payer: Self-pay

## 2018-03-12 ENCOUNTER — Other Ambulatory Visit: Payer: Self-pay | Admitting: Oncology

## 2018-03-12 DIAGNOSIS — C9000 Multiple myeloma not having achieved remission: Secondary | ICD-10-CM

## 2018-03-13 ENCOUNTER — Other Ambulatory Visit: Payer: Self-pay | Admitting: Oncology

## 2018-03-16 ENCOUNTER — Inpatient Hospital Stay: Payer: PPO

## 2018-03-16 ENCOUNTER — Encounter: Payer: Self-pay | Admitting: Oncology

## 2018-03-16 ENCOUNTER — Inpatient Hospital Stay (HOSPITAL_BASED_OUTPATIENT_CLINIC_OR_DEPARTMENT_OTHER): Payer: PPO | Admitting: Oncology

## 2018-03-16 ENCOUNTER — Ambulatory Visit: Payer: PPO

## 2018-03-16 VITALS — BP 113/80 | HR 105 | Temp 98.6°F | Resp 14 | Ht 60.0 in | Wt 171.4 lb

## 2018-03-16 DIAGNOSIS — D649 Anemia, unspecified: Secondary | ICD-10-CM | POA: Diagnosis not present

## 2018-03-16 DIAGNOSIS — Z79899 Other long term (current) drug therapy: Secondary | ICD-10-CM

## 2018-03-16 DIAGNOSIS — C9000 Multiple myeloma not having achieved remission: Secondary | ICD-10-CM

## 2018-03-16 DIAGNOSIS — Z5111 Encounter for antineoplastic chemotherapy: Secondary | ICD-10-CM | POA: Diagnosis not present

## 2018-03-16 DIAGNOSIS — F329 Major depressive disorder, single episode, unspecified: Secondary | ICD-10-CM

## 2018-03-16 DIAGNOSIS — Z853 Personal history of malignant neoplasm of breast: Secondary | ICD-10-CM

## 2018-03-16 DIAGNOSIS — Z23 Encounter for immunization: Secondary | ICD-10-CM

## 2018-03-16 LAB — CMP (CANCER CENTER ONLY)
ALK PHOS: 56 U/L (ref 38–126)
ALT: 41 U/L (ref 0–44)
ANION GAP: 12 (ref 5–15)
AST: 44 U/L — ABNORMAL HIGH (ref 15–41)
Albumin: 3.7 g/dL (ref 3.5–5.0)
BILIRUBIN TOTAL: 0.4 mg/dL (ref 0.3–1.2)
BUN: 25 mg/dL — AB (ref 8–23)
CALCIUM: 9.9 mg/dL (ref 8.9–10.3)
CO2: 24 mmol/L (ref 22–32)
CREATININE: 0.77 mg/dL (ref 0.44–1.00)
Chloride: 104 mmol/L (ref 98–111)
GFR, Est AFR Am: >60 mL/min (ref >60)
GFR, Estimated: >60 mL/min (ref >60)
GLUCOSE: 116 mg/dL — AB (ref 70–99)
Potassium: 4.9 mmol/L (ref 3.5–5.1)
SODIUM: 140 mmol/L (ref 135–145)
Total Protein: 6.9 g/dL (ref 6.5–8.1)

## 2018-03-16 LAB — CBC WITH DIFFERENTIAL (CANCER CENTER ONLY)
BASOS ABS: 0 10*3/uL (ref 0.0–0.1)
BASOS PCT: 0 %
EOS ABS: 0 10*3/uL (ref 0.0–0.5)
Eosinophils Relative: 1 %
HCT: 34.6 % — ABNORMAL LOW (ref 34.8–46.6)
Hemoglobin: 10.9 g/dL — ABNORMAL LOW (ref 11.6–15.9)
Lymphocytes Relative: 32 %
Lymphs Abs: 1.2 10*3/uL (ref 0.9–3.3)
MCH: 27.3 pg (ref 25.1–34.0)
MCHC: 31.5 g/dL (ref 31.5–36.0)
MCV: 86.7 fL (ref 79.5–101.0)
Monocytes Absolute: 0.7 10*3/uL (ref 0.1–0.9)
Monocytes Relative: 18 %
NEUTROS PCT: 49 %
Neutro Abs: 1.9 10*3/uL (ref 1.5–6.5)
Platelet Count: 196 10*3/uL (ref 145–400)
RBC: 3.99 MIL/uL (ref 3.70–5.45)
RDW: 17.4 % — ABNORMAL HIGH (ref 11.2–14.5)
WBC: 3.8 10*3/uL — AB (ref 3.9–10.3)

## 2018-03-16 MED ORDER — SODIUM CHLORIDE 0.9 % IV SOLN
Freq: Once | INTRAVENOUS | Status: AC
  Administered 2018-03-16: 11:00:00 via INTRAVENOUS
  Filled 2018-03-16: qty 250

## 2018-03-16 MED ORDER — INFLUENZA VAC SPLIT QUAD 0.5 ML IM SUSY
PREFILLED_SYRINGE | INTRAMUSCULAR | Status: AC
  Filled 2018-03-16: qty 0.5

## 2018-03-16 MED ORDER — SODIUM CHLORIDE 0.9 % IV SOLN
Freq: Once | INTRAVENOUS | Status: AC
  Administered 2018-03-16: 12:00:00 via INTRAVENOUS
  Filled 2018-03-16: qty 250

## 2018-03-16 MED ORDER — HEPARIN SOD (PORK) LOCK FLUSH 100 UNIT/ML IV SOLN
500.0000 [IU] | Freq: Once | INTRAVENOUS | Status: AC | PRN
  Administered 2018-03-16: 500 [IU]
  Filled 2018-03-16: qty 5

## 2018-03-16 MED ORDER — SODIUM CHLORIDE 0.9% FLUSH
10.0000 mL | Freq: Once | INTRAVENOUS | Status: AC | PRN
  Administered 2018-03-16: 10 mL
  Filled 2018-03-16: qty 10

## 2018-03-16 MED ORDER — INFLUENZA VAC SPLIT QUAD 0.5 ML IM SUSY
0.5000 mL | PREFILLED_SYRINGE | Freq: Once | INTRAMUSCULAR | Status: AC
  Administered 2018-03-16: 0.5 mL via INTRAMUSCULAR

## 2018-03-16 MED ORDER — ZOLEDRONIC ACID 4 MG/100ML IV SOLN
4.0000 mg | Freq: Once | INTRAVENOUS | Status: AC
  Administered 2018-03-16: 4 mg via INTRAVENOUS
  Filled 2018-03-16: qty 100

## 2018-03-16 MED ORDER — SODIUM CHLORIDE 0.9 % IV SOLN
300.0000 mg/m2 | Freq: Once | INTRAVENOUS | Status: AC
  Administered 2018-03-16: 560 mg via INTRAVENOUS
  Filled 2018-03-16: qty 28

## 2018-03-16 MED ORDER — DEXTROSE 5 % IV SOLN
38.0000 mg/m2 | Freq: Once | INTRAVENOUS | Status: AC
  Administered 2018-03-16: 70 mg via INTRAVENOUS
  Filled 2018-03-16: qty 30

## 2018-03-16 MED ORDER — PALONOSETRON HCL INJECTION 0.25 MG/5ML
0.2500 mg | Freq: Once | INTRAVENOUS | Status: AC
  Administered 2018-03-16: 0.25 mg via INTRAVENOUS

## 2018-03-16 MED ORDER — SODIUM CHLORIDE 0.9 % IV SOLN
Freq: Once | INTRAVENOUS | Status: DC
  Filled 2018-03-16: qty 250

## 2018-03-16 MED ORDER — SODIUM CHLORIDE 0.9% FLUSH
10.0000 mL | INTRAVENOUS | Status: DC | PRN
  Administered 2018-03-16: 10 mL
  Filled 2018-03-16: qty 10

## 2018-03-16 NOTE — Progress Notes (Signed)
[facility] OFFICE PROGRESS NOTE   Diagnosis: Multiple myeloma  INTERVAL HISTORY:   [name] returns for a scheduled visit.  She began treatment with carfilzomib, Cytoxan, and Decadron on 03/02/2018.  She reports nausea relieved with Compazine.  No emesis.  She complains of fatigue following chemotherapy.  Intermittent diarrhea is improved with cholestyramine.  No mouth sores.  Objective:  Vital signs in last 24 hours:  Blood pressure 113/80, pulse (!) 105, temperature 98.6 F (37 C), temperature source Oral, resp. rate 14, height 5' (1.524 m), weight 171 lb 6.4 oz (77.7 kg), SpO2 100 %.    HEENT: No thrush or ulcers Resp: Lungs clear bilaterally Cardio: Regular rate and rhythm GI: No hepatospleno megaly, no mass, nontender Vascular: No leg edema    Portacath/PICC-without erythema  Lab Results:  Lab Results  Component Value Date   WBC 3.8 (L) 03/16/2018   HGB 10.9 (L) 03/16/2018   HCT 34.6 (L) 03/16/2018   MCV 86.7 03/16/2018   PLT 196 03/16/2018   NEUTROABS 1.9 03/16/2018    CMP  Lab Results  Component Value Date   NA 140 03/16/2018   K 4.9 03/16/2018   CL 104 03/16/2018   CO2 24 03/16/2018   GLUCOSE 116 (H) 03/16/2018   BUN 25 (H) 03/16/2018   CREATININE 0.77 03/16/2018   CALCIUM 9.9 03/16/2018   PROT 6.9 03/16/2018   ALBUMIN 3.7 03/16/2018   AST 44 (H) 03/16/2018   ALT 41 03/16/2018   ALKPHOS 56 03/16/2018   BILITOT 0.4 03/16/2018   GFRNONAA >60 03/16/2018   GFRAA >60 03/16/2018     Medications: I have reviewed the patient's current medications.   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 2.4 cm solid right thyroid mass, follow-up ultrasound recommended  Biopsy 01/14/2018-atypia of undetermined significance or follicular lesion of undetermined significance  Affirmatesting- likely a benign lesion  6.Right rib fractures 12/21/2017     Disposition: [name] appears to be tolerating the current treatment well.  She will complete day 15 of cycle 1 carfilzomib/Cytoxan/Decadron today.  She would like to cancel day 16 therapy secondary to a scheduled appointment with the pain clinic.  She will return for an office visit and cycle 2 on 03/30/2018.  She would like to limit the next cycle to 2 weeks of treatment.  [name] will undergo a restaging myeloma panel after cycle 2.  She met with myself and the nursing director today.  We discussed her dissatisfaction with various issues at the Cancer center.  I explained our goal is to give her the best care possible.  She acknowledged that she understands this and would like to continue under our care.  [name] received an influenza vaccine today.  25 minutes were spent with the patient today.  The majority of  the time was used for counseling and coordination of care.  Betsy Coder, MD  03/16/2018  10:28 AM

## 2018-03-16 NOTE — Patient Instructions (Signed)
[facility] Discharge Instructions for Patients Receiving Chemotherapy  Today you received the following chemotherapy agents:  Kyprolis and Cytoxon. DO NOT TAKE ZOFRAN FOR THREE DAYS AFTER TREATMENT.  To help prevent nausea and vomiting after your treatment, we encourage you to take your nausea medication as directed.   If you develop nausea and vomiting that is not controlled by your nausea medication, call the clinic.   BELOW ARE SYMPTOMS THAT SHOULD BE REPORTED IMMEDIATELY:  *FEVER GREATER THAN 100.5 F  *CHILLS WITH OR WITHOUT FEVER  NAUSEA AND VOMITING THAT IS NOT CONTROLLED WITH YOUR NAUSEA MEDICATION  *UNUSUAL SHORTNESS OF BREATH  *UNUSUAL BRUISING OR BLEEDING  TENDERNESS IN MOUTH AND THROAT WITH OR WITHOUT PRESENCE OF ULCERS  *URINARY PROBLEMS  *BOWEL PROBLEMS  UNUSUAL RASH Items with * indicate a potential emergency and should be followed up as soon as possible.  Feel free to call the clinic should you have any questions or concerns. The clinic phone number is [phone].  Please show the [place] [facility] at check-in to the Emergency Department and triage nurse.

## 2018-03-16 NOTE — Progress Notes (Signed)
Nutrition Assessment   Reason for Assessment:   Verbal referral from Dr. Benay Spice to see patient for weight loss   ASSESSMENT:   67 year old female with multiple myeloma.  Patient receiving chemotherapy of carfilzomib, cytoxan and decadron. Past medical history of HTN, GERD, chronic diarrhea, DM, depression, hepatitis C, breast cancer  Met with patient during infusion this am.  Patient reports that after this treatment she just felt bad, no real issues with nausea just fatigue, no appetite.  Reports that has been drinking 2 ensure protein max per day typically in the am and pm.  Has instant oatmeal for breakfast, lunch maybe a hamburger patty or progresso soup.  Supper is grilled chicken or hamburger patty or cheerios and milk (this is most often).     Nutrition Focused Physical Exam: deferred   Medications: questran, decadron, MVI, prilosec   Labs: glucose 116   Anthropometrics:   Height: 60 inches Weight: 171 lb 6.4 oz today UBW: 164 lb in April per patient (intentionally lost to 164 lb).  Chart not reflective of this weight (179-181 lb April weights) BMI: 33 Noted weight August 27 180 lb (cancer ctr)  5% weight loss in the last 20 days, significant  Patient pleased with weight loss and wants to reach goal weight of 140 lb.   Estimated Energy Needs  Kcals: [id number] calories/d  Protein: 68-90 g protein Fluid: 1.9 L/d   NUTRITION DIAGNOSIS: Inadequate oral intake related to cancer related treatment side effects as evidenced by 5% weight loss in 20 days and poor po intake   MALNUTRITION DIAGNOSIS: continue to monitor   INTERVENTION:  Discussed importance of getting adequate calories and protein and risk of losing large amounts of weight in short period of time.  Encouraged trying higher calorie shakes to prevent weight loss.  Samples given   MONITORING, EVALUATION, GOAL: Patient will consume adequate calories and protein to maintain weight during  treatment   Next Visit: to be determined  Arlett Goold B. Zenia Resides, [place], [place] Registered Dietitian [phone] (pager)

## 2018-03-17 ENCOUNTER — Inpatient Hospital Stay: Payer: PPO

## 2018-03-17 ENCOUNTER — Telehealth: Payer: Self-pay | Admitting: Oncology

## 2018-03-17 DIAGNOSIS — F329 Major depressive disorder, single episode, unspecified: Secondary | ICD-10-CM | POA: Diagnosis not present

## 2018-03-17 DIAGNOSIS — M47817 Spondylosis without myelopathy or radiculopathy, lumbosacral region: Secondary | ICD-10-CM | POA: Diagnosis not present

## 2018-03-17 DIAGNOSIS — Z79891 Long term (current) use of opiate analgesic: Secondary | ICD-10-CM | POA: Diagnosis not present

## 2018-03-17 DIAGNOSIS — G894 Chronic pain syndrome: Secondary | ICD-10-CM | POA: Diagnosis not present

## 2018-03-17 DIAGNOSIS — M4807 Spinal stenosis, lumbosacral region: Secondary | ICD-10-CM | POA: Diagnosis not present

## 2018-03-17 NOTE — Telephone Encounter (Signed)
Scheduled appt per 9/17 los - patient is aware of appt date and time.

## 2018-03-28 ENCOUNTER — Other Ambulatory Visit: Payer: Self-pay | Admitting: Oncology

## 2018-03-30 ENCOUNTER — Inpatient Hospital Stay: Payer: PPO

## 2018-03-30 ENCOUNTER — Inpatient Hospital Stay (HOSPITAL_BASED_OUTPATIENT_CLINIC_OR_DEPARTMENT_OTHER): Payer: PPO | Admitting: Oncology

## 2018-03-30 ENCOUNTER — Inpatient Hospital Stay: Payer: PPO | Attending: Hematology and Oncology

## 2018-03-30 ENCOUNTER — Other Ambulatory Visit: Payer: Self-pay | Admitting: Oncology

## 2018-03-30 VITALS — BP 123/72 | HR 99 | Temp 98.7°F | Resp 18 | Ht 60.0 in | Wt 176.2 lb

## 2018-03-30 DIAGNOSIS — C9000 Multiple myeloma not having achieved remission: Secondary | ICD-10-CM | POA: Diagnosis not present

## 2018-03-30 DIAGNOSIS — D649 Anemia, unspecified: Secondary | ICD-10-CM | POA: Insufficient documentation

## 2018-03-30 DIAGNOSIS — Z853 Personal history of malignant neoplasm of breast: Secondary | ICD-10-CM | POA: Insufficient documentation

## 2018-03-30 DIAGNOSIS — F329 Major depressive disorder, single episode, unspecified: Secondary | ICD-10-CM | POA: Insufficient documentation

## 2018-03-30 DIAGNOSIS — Z95828 Presence of other vascular implants and grafts: Secondary | ICD-10-CM

## 2018-03-30 DIAGNOSIS — Z5111 Encounter for antineoplastic chemotherapy: Secondary | ICD-10-CM | POA: Insufficient documentation

## 2018-03-30 DIAGNOSIS — Z79899 Other long term (current) drug therapy: Secondary | ICD-10-CM | POA: Insufficient documentation

## 2018-03-30 LAB — CMP (CANCER CENTER ONLY)
ALBUMIN: 3.4 g/dL — AB (ref 3.5–5.0)
ALK PHOS: 51 U/L (ref 38–126)
ALT: 39 U/L (ref 0–44)
AST: 43 U/L — AB (ref 15–41)
Anion gap: 8 (ref 5–15)
BILIRUBIN TOTAL: 0.3 mg/dL (ref 0.3–1.2)
BUN: 18 mg/dL (ref 8–23)
CALCIUM: 8.3 mg/dL — AB (ref 8.9–10.3)
CO2: 23 mmol/L (ref 22–32)
CREATININE: 0.71 mg/dL (ref 0.44–1.00)
Chloride: 106 mmol/L (ref 98–111)
GFR, Est AFR Am: >60 mL/min (ref >60)
GLUCOSE: 175 mg/dL — AB (ref 70–99)
POTASSIUM: 4.3 mmol/L (ref 3.5–5.1)
Sodium: 137 mmol/L (ref 135–145)
TOTAL PROTEIN: 6.2 g/dL — AB (ref 6.5–8.1)

## 2018-03-30 LAB — CBC WITH DIFFERENTIAL (CANCER CENTER ONLY)
BASOS ABS: 0 10*3/uL (ref 0.0–0.1)
BASOS PCT: 0 %
Eosinophils Absolute: 0 10*3/uL (ref 0.0–0.5)
Eosinophils Relative: 0 %
HEMATOCRIT: 31.2 % — AB (ref 34.8–46.6)
Hemoglobin: 9.5 g/dL — ABNORMAL LOW (ref 11.6–15.9)
Lymphocytes Relative: 7 %
Lymphs Abs: 0.4 10*3/uL — ABNORMAL LOW (ref 0.9–3.3)
MCH: 26.3 pg (ref 25.1–34.0)
MCHC: 30.4 g/dL — ABNORMAL LOW (ref 31.5–36.0)
MCV: 86.4 fL (ref 79.5–101.0)
MONO ABS: 0 10*3/uL — AB (ref 0.1–0.9)
Monocytes Relative: 0 %
NEUTROS ABS: 4.7 10*3/uL (ref 1.5–6.5)
Neutrophils Relative %: 93 %
Platelet Count: 234 10*3/uL (ref 145–400)
RBC: 3.61 MIL/uL — ABNORMAL LOW (ref 3.70–5.45)
RDW: 16.8 % — AB (ref 11.2–14.5)
WBC Count: 5.1 10*3/uL (ref 3.9–10.3)

## 2018-03-30 MED ORDER — SODIUM CHLORIDE 0.9 % IV SOLN
Freq: Once | INTRAVENOUS | Status: AC
  Filled 2018-03-30: qty 250

## 2018-03-30 MED ORDER — SODIUM CHLORIDE 0.9% FLUSH
10.0000 mL | INTRAVENOUS | Status: DC | PRN
  Administered 2018-03-30: 10 mL
  Filled 2018-03-30: qty 10

## 2018-03-30 MED ORDER — PALONOSETRON HCL INJECTION 0.25 MG/5ML
INTRAVENOUS | Status: AC
  Filled 2018-03-30: qty 5

## 2018-03-30 MED ORDER — HEPARIN SOD (PORK) LOCK FLUSH 100 UNIT/ML IV SOLN
500.0000 [IU] | Freq: Once | INTRAVENOUS | Status: AC | PRN
  Administered 2018-03-30: 500 [IU]
  Filled 2018-03-30: qty 5

## 2018-03-30 MED ORDER — SODIUM CHLORIDE 0.9% FLUSH
10.0000 mL | INTRAVENOUS | Status: DC | PRN
  Administered 2018-03-30: 10 mL via INTRAVENOUS
  Filled 2018-03-30: qty 10

## 2018-03-30 MED ORDER — PALONOSETRON HCL INJECTION 0.25 MG/5ML
0.2500 mg | Freq: Once | INTRAVENOUS | Status: AC
  Administered 2018-03-30: 0.25 mg via INTRAVENOUS

## 2018-03-30 MED ORDER — SODIUM CHLORIDE 0.9 % IV SOLN
300.0000 mg/m2 | Freq: Once | INTRAVENOUS | Status: AC
  Administered 2018-03-30: 560 mg via INTRAVENOUS
  Filled 2018-03-30: qty 28

## 2018-03-30 MED ORDER — SODIUM CHLORIDE 0.9 % IV SOLN
Freq: Once | INTRAVENOUS | Status: AC
  Administered 2018-03-30: 14:00:00 via INTRAVENOUS
  Filled 2018-03-30: qty 250

## 2018-03-30 MED ORDER — DEXTROSE 5 % IV SOLN
36.0000 mg/m2 | Freq: Once | INTRAVENOUS | Status: AC
  Administered 2018-03-30: 66 mg via INTRAVENOUS
  Filled 2018-03-30: qty 30

## 2018-03-30 NOTE — Progress Notes (Signed)
  [name] OFFICE PROGRESS NOTE   Diagnosis: Multiple myeloma  INTERVAL HISTORY:   [name] returns as scheduled.  She completed cycle 1 of carfilzomib, Cytoxan, Decadron on 03/16/2018.  She reports tolerating the treatment well.  No nausea, mouth sores, or diarrhea.  No new complaint.  Objective:  Vital signs in last 24 hours:  Blood pressure 123/72, pulse 99, temperature 98.7 F (37.1 C), temperature source Oral, resp. rate 18, height 5' (1.524 m), weight 176 lb 3.2 oz (79.9 kg), SpO2 100 %.    HEENT: No thrush or ulcers Resp: Lungs clear bilaterally Cardio: Regular rate and rhythm GI: No hepatosplenomegaly, no mass, nontender Vascular: No leg edema    Portacath/PICC-without erythema  Lab Results:  Lab Results  Component Value Date   WBC 5.1 03/30/2018   HGB 9.5 (L) 03/30/2018   HCT 31.2 (L) 03/30/2018   MCV 86.4 03/30/2018   PLT 234 03/30/2018   NEUTROABS 4.7 03/30/2018    CMP  Lab Results  Component Value Date   NA 137 03/30/2018   K 4.3 03/30/2018   CL 106 03/30/2018   CO2 23 03/30/2018   GLUCOSE 175 (H) 03/30/2018   BUN 18 03/30/2018   CREATININE 0.71 03/30/2018   CALCIUM 8.3 (L) 03/30/2018   PROT 6.2 (L) 03/30/2018   ALBUMIN 3.4 (L) 03/30/2018   AST 43 (H) 03/30/2018   ALT 39 03/30/2018   ALKPHOS 51 03/30/2018   BILITOT 0.3 03/30/2018   GFRNONAA >60 03/30/2018   GFRAA >60 03/30/2018    Medications: I have reviewed the patient's current medications.   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 2.4 cm solid right thyroid mass, follow-up ultrasound recommended  Biopsy 01/14/2018-atypia of undetermined significance or follicular lesion of undetermined significance  Affirmatesting- likely a benign lesion  6.Right rib fractures 12/21/2017    Disposition:  [name] appears stable.  She will begin cycle 2 carfilzomib/Cytoxan/Decadron today.  She is tolerating the treatment well.  She has decided to complete the third week of treatment with this cycle.  She will return for restaging labs on 04/19/2018 and an office visit on 04/21/2018.  15 minutes were spent with the patient today.  The majority of the time was used for counseling and coordination of care.  Betsy Coder, MD  03/30/2018  12:44 PM

## 2018-03-30 NOTE — Progress Notes (Signed)
Okay to treat with HGb 9.5 per Dr. Benay Spice.

## 2018-03-30 NOTE — Patient Instructions (Signed)
[place] Discharge Instructions for Patients Receiving Chemotherapy  Today you received the following chemotherapy agents:  Kyprolis and Cytoxon. DO NOT TAKE ZOFRAN FOR THREE DAYS AFTER TREATMENT.  To help prevent nausea and vomiting after your treatment, we encourage you to take your nausea medication as directed.   If you develop nausea and vomiting that is not controlled by your nausea medication, call the clinic.   BELOW ARE SYMPTOMS THAT SHOULD BE REPORTED IMMEDIATELY:  *FEVER GREATER THAN 100.5 F  *CHILLS WITH OR WITHOUT FEVER  NAUSEA AND VOMITING THAT IS NOT CONTROLLED WITH YOUR NAUSEA MEDICATION  *UNUSUAL SHORTNESS OF BREATH  *UNUSUAL BRUISING OR BLEEDING  TENDERNESS IN MOUTH AND THROAT WITH OR WITHOUT PRESENCE OF ULCERS  *URINARY PROBLEMS  *BOWEL PROBLEMS  UNUSUAL RASH Items with * indicate a potential emergency and should be followed up as soon as possible.  Feel free to call the clinic should you have any questions or concerns. The clinic phone number is [phone].  Please show the [place] at check-in to the Emergency Department and triage nurse.

## 2018-03-31 ENCOUNTER — Inpatient Hospital Stay: Payer: PPO

## 2018-03-31 ENCOUNTER — Telehealth: Payer: Self-pay | Admitting: Oncology

## 2018-03-31 VITALS — BP 139/73 | HR 93 | Temp 98.4°F | Resp 20

## 2018-03-31 DIAGNOSIS — Z5111 Encounter for antineoplastic chemotherapy: Secondary | ICD-10-CM | POA: Diagnosis not present

## 2018-03-31 DIAGNOSIS — C9000 Multiple myeloma not having achieved remission: Secondary | ICD-10-CM

## 2018-03-31 MED ORDER — SODIUM CHLORIDE 0.9% FLUSH
10.0000 mL | Freq: Once | INTRAVENOUS | Status: AC
  Administered 2018-03-31: 10 mL via INTRAVENOUS
  Filled 2018-03-31: qty 10

## 2018-03-31 MED ORDER — SODIUM CHLORIDE 0.9 % IV SOLN
Freq: Once | INTRAVENOUS | Status: AC
  Administered 2018-03-31: 14:00:00 via INTRAVENOUS
  Filled 2018-03-31: qty 250

## 2018-03-31 MED ORDER — HEPARIN SOD (PORK) LOCK FLUSH 100 UNIT/ML IV SOLN
500.0000 [IU] | Freq: Once | INTRAVENOUS | Status: AC
  Administered 2018-03-31: 500 [IU] via INTRAVENOUS
  Filled 2018-03-31: qty 5

## 2018-03-31 MED ORDER — DEXTROSE 5 % IV SOLN
36.0000 mg/m2 | Freq: Once | INTRAVENOUS | Status: AC
  Administered 2018-03-31: 66 mg via INTRAVENOUS
  Filled 2018-03-31: qty 3

## 2018-03-31 NOTE — Patient Instructions (Signed)
[facility] Discharge Instructions for Patients Receiving Chemotherapy  Today you received the following chemotherapy agents:  Kyprolis  To help prevent nausea and vomiting after your treatment, we encourage you to take your nausea medication as directed.   If you develop nausea and vomiting that is not controlled by your nausea medication, call the clinic.   BELOW ARE SYMPTOMS THAT SHOULD BE REPORTED IMMEDIATELY:  *FEVER GREATER THAN 100.5 F  *CHILLS WITH OR WITHOUT FEVER  NAUSEA AND VOMITING THAT IS NOT CONTROLLED WITH YOUR NAUSEA MEDICATION  *UNUSUAL SHORTNESS OF BREATH  *UNUSUAL BRUISING OR BLEEDING  TENDERNESS IN MOUTH AND THROAT WITH OR WITHOUT PRESENCE OF ULCERS  *URINARY PROBLEMS  *BOWEL PROBLEMS  UNUSUAL RASH Items with * indicate a potential emergency and should be followed up as soon as possible.  Feel free to call the clinic should you have any questions or concerns. The clinic phone number is [phone].  Please show the CHEMO ALERT CARD at check-in to the Emergency Department and triage nurse.   

## 2018-03-31 NOTE — Telephone Encounter (Signed)
Appts scheduled calendar printed per 10/1 los

## 2018-04-03 ENCOUNTER — Other Ambulatory Visit: Payer: Self-pay | Admitting: Oncology

## 2018-04-06 ENCOUNTER — Inpatient Hospital Stay: Payer: PPO

## 2018-04-06 VITALS — BP 152/89 | HR 91 | Temp 98.4°F | Resp 20

## 2018-04-06 DIAGNOSIS — Z95828 Presence of other vascular implants and grafts: Secondary | ICD-10-CM

## 2018-04-06 DIAGNOSIS — C9000 Multiple myeloma not having achieved remission: Secondary | ICD-10-CM

## 2018-04-06 DIAGNOSIS — Z5111 Encounter for antineoplastic chemotherapy: Secondary | ICD-10-CM | POA: Diagnosis not present

## 2018-04-06 LAB — CBC WITH DIFFERENTIAL (CANCER CENTER ONLY)
ABS IMMATURE GRANULOCYTES: 0.02 10*3/uL (ref 0.00–0.07)
BASOS PCT: 0 %
Basophils Absolute: 0 10*3/uL (ref 0.0–0.1)
Eosinophils Absolute: 0 10*3/uL (ref 0.0–0.5)
Eosinophils Relative: 0 %
HCT: 34.6 % — ABNORMAL LOW (ref 36.0–46.0)
Hemoglobin: 10.5 g/dL — ABNORMAL LOW (ref 12.0–15.0)
IMMATURE GRANULOCYTES: 1 %
LYMPHS ABS: 0.4 10*3/uL — AB (ref 0.7–4.0)
Lymphocytes Relative: 9 %
MCH: 25.9 pg — ABNORMAL LOW (ref 26.0–34.0)
MCHC: 30.3 g/dL (ref 30.0–36.0)
MCV: 85.4 fL (ref 80.0–100.0)
Monocytes Absolute: 0.1 10*3/uL (ref 0.1–1.0)
Monocytes Relative: 1 %
NEUTROS ABS: 3.7 10*3/uL (ref 1.7–7.7)
Neutrophils Relative %: 89 %
Platelet Count: 159 10*3/uL (ref 150–400)
RBC: 4.05 MIL/uL (ref 3.87–5.11)
RDW: 17.6 % — ABNORMAL HIGH (ref 11.5–15.5)
WBC Count: 4.2 10*3/uL (ref 4.0–10.5)
nRBC: 0 % (ref 0.0–0.2)

## 2018-04-06 MED ORDER — DEXTROSE 5 % IV SOLN
36.0000 mg/m2 | Freq: Once | INTRAVENOUS | Status: AC
  Administered 2018-04-06: 66 mg via INTRAVENOUS
  Filled 2018-04-06: qty 30

## 2018-04-06 MED ORDER — SODIUM CHLORIDE 0.9% FLUSH
10.0000 mL | INTRAVENOUS | Status: DC | PRN
  Administered 2018-04-06: 10 mL
  Filled 2018-04-06: qty 10

## 2018-04-06 MED ORDER — SODIUM CHLORIDE 0.9 % IV SOLN
Freq: Once | INTRAVENOUS | Status: AC
  Administered 2018-04-06: 15:00:00 via INTRAVENOUS
  Filled 2018-04-06: qty 250

## 2018-04-06 MED ORDER — SODIUM CHLORIDE 0.9 % IV SOLN
Freq: Once | INTRAVENOUS | Status: AC
  Administered 2018-04-06: 14:00:00 via INTRAVENOUS
  Filled 2018-04-06: qty 250

## 2018-04-06 MED ORDER — PALONOSETRON HCL INJECTION 0.25 MG/5ML
0.2500 mg | Freq: Once | INTRAVENOUS | Status: AC
  Administered 2018-04-06: 0.25 mg via INTRAVENOUS

## 2018-04-06 MED ORDER — HEPARIN SOD (PORK) LOCK FLUSH 100 UNIT/ML IV SOLN
500.0000 [IU] | Freq: Once | INTRAVENOUS | Status: AC | PRN
  Administered 2018-04-06: 500 [IU]
  Filled 2018-04-06: qty 5

## 2018-04-06 MED ORDER — SODIUM CHLORIDE 0.9 % IV SOLN
300.0000 mg/m2 | Freq: Once | INTRAVENOUS | Status: AC
  Administered 2018-04-06: 560 mg via INTRAVENOUS
  Filled 2018-04-06: qty 28

## 2018-04-06 MED ORDER — PALONOSETRON HCL INJECTION 0.25 MG/5ML
INTRAVENOUS | Status: AC
  Filled 2018-04-06: qty 5

## 2018-04-06 MED ORDER — SODIUM CHLORIDE 0.9% FLUSH
10.0000 mL | INTRAVENOUS | Status: DC | PRN
  Administered 2018-04-06: 10 mL via INTRAVENOUS
  Filled 2018-04-06: qty 10

## 2018-04-06 NOTE — Patient Instructions (Signed)
[place] Discharge Instructions for Patients Receiving Chemotherapy  Today you received the following chemotherapy agents:  Kyprolis and Cytoxon. DO NOT TAKE ZOFRAN FOR THREE DAYS AFTER TREATMENT.  To help prevent nausea and vomiting after your treatment, we encourage you to take your nausea medication as directed.   If you develop nausea and vomiting that is not controlled by your nausea medication, call the clinic.   BELOW ARE SYMPTOMS THAT SHOULD BE REPORTED IMMEDIATELY:  *FEVER GREATER THAN 100.5 F  *CHILLS WITH OR WITHOUT FEVER  NAUSEA AND VOMITING THAT IS NOT CONTROLLED WITH YOUR NAUSEA MEDICATION  *UNUSUAL SHORTNESS OF BREATH  *UNUSUAL BRUISING OR BLEEDING  TENDERNESS IN MOUTH AND THROAT WITH OR WITHOUT PRESENCE OF ULCERS  *URINARY PROBLEMS  *BOWEL PROBLEMS  UNUSUAL RASH Items with * indicate a potential emergency and should be followed up as soon as possible.  Feel free to call the clinic should you have any questions or concerns. The clinic phone number is [phone].  Please show the [place] at check-in to the Emergency Department and triage nurse.

## 2018-04-06 NOTE — Progress Notes (Signed)
Nutrition Follow-up:  Patient with multiple myeloma.  Patient receiving chemotherapy.    Met with patient during infusion.  Patient reports that she has tried the higher calorie shakes and likes them.  Planning to get some higher calorie shake and use in addition to ensure protein max shakes.  Patient reports that appetite has been fairly good.  Continue to focus on good sources of protein in diet and trying to add good calories.    No issues with nausea but does have days when just does not feel well.      Medications: reviewed  Labs: reviewed  Anthropometrics:   Weight has increased to 176 lb today from 171 lb on 9/17.    NUTRITION DIAGNOSIS: Inadequate oral intake improving with weight gain   MALNUTRITION DIAGNOSIS: continue to monitor   INTERVENTION:  Patient with questions regarding similar products to boost/ensure.  Discussed options. Encouraged patient to focus on well balanced diet    MONITORING, EVALUATION, GOAL: Patient will consume adequate calories and protein to maintain weight during treatment   NEXT VISIT: to be determined with treatment  Ciara Kagan B. Zenia Resides, [place], [place] Registered Dietitian [phone] (pager)

## 2018-04-07 ENCOUNTER — Other Ambulatory Visit: Payer: Self-pay | Admitting: Oncology

## 2018-04-07 ENCOUNTER — Inpatient Hospital Stay: Payer: PPO

## 2018-04-07 VITALS — BP 155/81 | HR 92 | Temp 98.7°F | Resp 20

## 2018-04-07 DIAGNOSIS — C9000 Multiple myeloma not having achieved remission: Secondary | ICD-10-CM

## 2018-04-07 DIAGNOSIS — Z5111 Encounter for antineoplastic chemotherapy: Secondary | ICD-10-CM | POA: Diagnosis not present

## 2018-04-07 MED ORDER — HEPARIN SOD (PORK) LOCK FLUSH 100 UNIT/ML IV SOLN
500.0000 [IU] | Freq: Once | INTRAVENOUS | Status: DC | PRN
  Filled 2018-04-07: qty 5

## 2018-04-07 MED ORDER — SODIUM CHLORIDE 0.9 % IV SOLN
Freq: Once | INTRAVENOUS | Status: AC
  Administered 2018-04-07: 14:00:00 via INTRAVENOUS
  Filled 2018-04-07: qty 250

## 2018-04-07 MED ORDER — SODIUM CHLORIDE 0.9% FLUSH
10.0000 mL | INTRAVENOUS | Status: DC | PRN
  Administered 2018-04-07: 10 mL
  Filled 2018-04-07: qty 10

## 2018-04-07 MED ORDER — SODIUM CHLORIDE 0.9 % IV SOLN
Freq: Once | INTRAVENOUS | Status: DC
  Filled 2018-04-07: qty 250

## 2018-04-07 MED ORDER — DEXTROSE 5 % IV SOLN
36.0000 mg/m2 | Freq: Once | INTRAVENOUS | Status: AC
  Administered 2018-04-07: 66 mg via INTRAVENOUS
  Filled 2018-04-07: qty 3

## 2018-04-07 NOTE — Progress Notes (Signed)
Per Dr. Benay Spice, ok to treat with CMET from 03/30/18.

## 2018-04-11 ENCOUNTER — Other Ambulatory Visit: Payer: Self-pay | Admitting: Oncology

## 2018-04-13 ENCOUNTER — Inpatient Hospital Stay: Payer: PPO

## 2018-04-13 ENCOUNTER — Other Ambulatory Visit: Payer: Self-pay | Admitting: Emergency Medicine

## 2018-04-13 VITALS — BP 163/78 | HR 97 | Temp 99.0°F | Resp 20

## 2018-04-13 DIAGNOSIS — Z95828 Presence of other vascular implants and grafts: Secondary | ICD-10-CM

## 2018-04-13 DIAGNOSIS — C9000 Multiple myeloma not having achieved remission: Secondary | ICD-10-CM

## 2018-04-13 DIAGNOSIS — Z5111 Encounter for antineoplastic chemotherapy: Secondary | ICD-10-CM | POA: Diagnosis not present

## 2018-04-13 LAB — CBC WITH DIFFERENTIAL (CANCER CENTER ONLY)
Abs Immature Granulocytes: 0.05 10*3/uL (ref 0.00–0.07)
Basophils Absolute: 0 10*3/uL (ref 0.0–0.1)
Basophils Relative: 0 %
Eosinophils Absolute: 0 10*3/uL (ref 0.0–0.5)
Eosinophils Relative: 0 %
HEMATOCRIT: 31.9 % — AB (ref 36.0–46.0)
HEMOGLOBIN: 9.9 g/dL — AB (ref 12.0–15.0)
IMMATURE GRANULOCYTES: 1 %
LYMPHS ABS: 0.9 10*3/uL (ref 0.7–4.0)
Lymphocytes Relative: 14 %
MCH: 26.7 pg (ref 26.0–34.0)
MCHC: 31 g/dL (ref 30.0–36.0)
MCV: 86 fL (ref 80.0–100.0)
MONOS PCT: 12 %
Monocytes Absolute: 0.8 10*3/uL (ref 0.1–1.0)
NEUTROS PCT: 73 %
Neutro Abs: 4.8 10*3/uL (ref 1.7–7.7)
Platelet Count: 205 10*3/uL (ref 150–400)
RBC: 3.71 MIL/uL — ABNORMAL LOW (ref 3.87–5.11)
RDW: 18.6 % — ABNORMAL HIGH (ref 11.5–15.5)
WBC Count: 6.5 10*3/uL (ref 4.0–10.5)
nRBC: 0 % (ref 0.0–0.2)

## 2018-04-13 LAB — CMP (CANCER CENTER ONLY)
ALK PHOS: 55 U/L (ref 38–126)
ALT: 39 U/L (ref 0–44)
AST: 36 U/L (ref 15–41)
Albumin: 3.4 g/dL — ABNORMAL LOW (ref 3.5–5.0)
Anion gap: 10 (ref 5–15)
BUN: 22 mg/dL (ref 8–23)
CHLORIDE: 102 mmol/L (ref 98–111)
CO2: 26 mmol/L (ref 22–32)
CREATININE: 0.74 mg/dL (ref 0.44–1.00)
Calcium: 9.1 mg/dL (ref 8.9–10.3)
GFR, Est AFR Am: >60 mL/min (ref >60)
GFR, Estimated: >60 mL/min (ref >60)
Glucose, Bld: 130 mg/dL — ABNORMAL HIGH (ref 70–99)
Potassium: 4.8 mmol/L (ref 3.5–5.1)
SODIUM: 138 mmol/L (ref 135–145)
Total Bilirubin: 0.4 mg/dL (ref 0.3–1.2)
Total Protein: 6.5 g/dL (ref 6.5–8.1)

## 2018-04-13 MED ORDER — SODIUM CHLORIDE 0.9 % IV SOLN
Freq: Once | INTRAVENOUS | Status: AC
  Administered 2018-04-13: 13:00:00 via INTRAVENOUS
  Filled 2018-04-13: qty 250

## 2018-04-13 MED ORDER — HEPARIN SOD (PORK) LOCK FLUSH 100 UNIT/ML IV SOLN
500.0000 [IU] | Freq: Once | INTRAVENOUS | Status: AC | PRN
  Administered 2018-04-13: 500 [IU]
  Filled 2018-04-13: qty 5

## 2018-04-13 MED ORDER — PALONOSETRON HCL INJECTION 0.25 MG/5ML
INTRAVENOUS | Status: AC
  Filled 2018-04-13: qty 5

## 2018-04-13 MED ORDER — SODIUM CHLORIDE 0.9% FLUSH
10.0000 mL | INTRAVENOUS | Status: DC | PRN
  Administered 2018-04-13: 10 mL
  Filled 2018-04-13: qty 10

## 2018-04-13 MED ORDER — PALONOSETRON HCL INJECTION 0.25 MG/5ML
0.2500 mg | Freq: Once | INTRAVENOUS | Status: AC
  Administered 2018-04-13: 0.25 mg via INTRAVENOUS

## 2018-04-13 MED ORDER — SODIUM CHLORIDE 0.9 % IV SOLN
Freq: Once | INTRAVENOUS | Status: AC
  Administered 2018-04-13: 14:00:00 via INTRAVENOUS
  Filled 2018-04-13: qty 250

## 2018-04-13 MED ORDER — DEXTROSE 5 % IV SOLN
36.0000 mg/m2 | Freq: Once | INTRAVENOUS | Status: AC
  Administered 2018-04-13: 66 mg via INTRAVENOUS
  Filled 2018-04-13: qty 3

## 2018-04-13 MED ORDER — SODIUM CHLORIDE 0.9 % IV SOLN
300.0000 mg/m2 | Freq: Once | INTRAVENOUS | Status: AC
  Administered 2018-04-13: 560 mg via INTRAVENOUS
  Filled 2018-04-13: qty 28

## 2018-04-13 MED ORDER — SODIUM CHLORIDE 0.9% FLUSH
10.0000 mL | INTRAVENOUS | Status: DC | PRN
  Administered 2018-04-13: 10 mL via INTRAVENOUS
  Filled 2018-04-13: qty 10

## 2018-04-13 NOTE — Patient Instructions (Signed)
[place] Discharge Instructions for Patients Receiving Chemotherapy  Today you received the following chemotherapy agents: carfilzomib (Kyprolis) and cyclophosphamide (Cytoxan). DO NOT TAKE ZOFRAN FOR THREE DAYS AFTER TREATMENT.  To help prevent nausea and vomiting after your treatment, we encourage you to take your nausea medication as directed.   If you develop nausea and vomiting that is not controlled by your nausea medication, call the clinic.   BELOW ARE SYMPTOMS THAT SHOULD BE REPORTED IMMEDIATELY:  *FEVER GREATER THAN 100.5 F  *CHILLS WITH OR WITHOUT FEVER  NAUSEA AND VOMITING THAT IS NOT CONTROLLED WITH YOUR NAUSEA MEDICATION  *UNUSUAL SHORTNESS OF BREATH  *UNUSUAL BRUISING OR BLEEDING  TENDERNESS IN MOUTH AND THROAT WITH OR WITHOUT PRESENCE OF ULCERS  *URINARY PROBLEMS  *BOWEL PROBLEMS  UNUSUAL RASH Items with * indicate a potential emergency and should be followed up as soon as possible.  Feel free to call the clinic should you have any questions or concerns. The clinic phone number is [phone].  Please show the [place] at check-in to the Emergency Department and triage nurse.

## 2018-04-13 NOTE — Progress Notes (Signed)
Ok to tx without CMP resulted yet, per Dr.Sherrill.

## 2018-04-14 ENCOUNTER — Inpatient Hospital Stay: Payer: PPO

## 2018-04-14 VITALS — BP 125/80 | HR 95 | Temp 98.5°F | Resp 20

## 2018-04-14 DIAGNOSIS — F329 Major depressive disorder, single episode, unspecified: Secondary | ICD-10-CM | POA: Diagnosis not present

## 2018-04-14 DIAGNOSIS — G894 Chronic pain syndrome: Secondary | ICD-10-CM | POA: Diagnosis not present

## 2018-04-14 DIAGNOSIS — C9000 Multiple myeloma not having achieved remission: Secondary | ICD-10-CM

## 2018-04-14 DIAGNOSIS — Z5111 Encounter for antineoplastic chemotherapy: Secondary | ICD-10-CM | POA: Diagnosis not present

## 2018-04-14 DIAGNOSIS — M4807 Spinal stenosis, lumbosacral region: Secondary | ICD-10-CM | POA: Diagnosis not present

## 2018-04-14 DIAGNOSIS — M47817 Spondylosis without myelopathy or radiculopathy, lumbosacral region: Secondary | ICD-10-CM | POA: Diagnosis not present

## 2018-04-14 MED ORDER — HEPARIN SOD (PORK) LOCK FLUSH 100 UNIT/ML IV SOLN
500.0000 [IU] | Freq: Once | INTRAVENOUS | Status: AC | PRN
  Administered 2018-04-14: 500 [IU]
  Filled 2018-04-14: qty 5

## 2018-04-14 MED ORDER — SODIUM CHLORIDE 0.9 % IV SOLN
Freq: Once | INTRAVENOUS | Status: AC
  Administered 2018-04-14: 13:00:00 via INTRAVENOUS
  Filled 2018-04-14: qty 250

## 2018-04-14 MED ORDER — DEXTROSE 5 % IV SOLN
36.0000 mg/m2 | Freq: Once | INTRAVENOUS | Status: AC
  Administered 2018-04-14: 66 mg via INTRAVENOUS
  Filled 2018-04-14: qty 30

## 2018-04-14 MED ORDER — SODIUM CHLORIDE 0.9 % IV SOLN
Freq: Once | INTRAVENOUS | Status: DC
  Filled 2018-04-14: qty 250

## 2018-04-14 MED ORDER — SODIUM CHLORIDE 0.9% FLUSH
10.0000 mL | INTRAVENOUS | Status: DC | PRN
  Administered 2018-04-14: 10 mL
  Filled 2018-04-14: qty 10

## 2018-04-19 ENCOUNTER — Inpatient Hospital Stay: Payer: PPO

## 2018-04-19 DIAGNOSIS — C9000 Multiple myeloma not having achieved remission: Secondary | ICD-10-CM

## 2018-04-19 DIAGNOSIS — Z5111 Encounter for antineoplastic chemotherapy: Secondary | ICD-10-CM | POA: Diagnosis not present

## 2018-04-19 DIAGNOSIS — Z95828 Presence of other vascular implants and grafts: Secondary | ICD-10-CM

## 2018-04-19 LAB — CBC WITH DIFFERENTIAL (CANCER CENTER ONLY)
Abs Immature Granulocytes: 0.07 10*3/uL (ref 0.00–0.07)
BASOS PCT: 0 %
Basophils Absolute: 0 10*3/uL (ref 0.0–0.1)
EOS ABS: 0 10*3/uL (ref 0.0–0.5)
EOS PCT: 0 %
HEMATOCRIT: 33 % — AB (ref 36.0–46.0)
Hemoglobin: 10.2 g/dL — ABNORMAL LOW (ref 12.0–15.0)
Immature Granulocytes: 1 %
LYMPHS PCT: 17 %
Lymphs Abs: 1.1 10*3/uL (ref 0.7–4.0)
MCH: 26.3 pg (ref 26.0–34.0)
MCHC: 30.9 g/dL (ref 30.0–36.0)
MCV: 85.1 fL (ref 80.0–100.0)
MONO ABS: 0.8 10*3/uL (ref 0.1–1.0)
Monocytes Relative: 13 %
NRBC: 0.5 % — AB (ref 0.0–0.2)
Neutro Abs: 4.2 10*3/uL (ref 1.7–7.7)
Neutrophils Relative %: 69 %
Platelet Count: 207 10*3/uL (ref 150–400)
RBC: 3.88 MIL/uL (ref 3.87–5.11)
RDW: 18.9 % — AB (ref 11.5–15.5)
WBC: 6.1 10*3/uL (ref 4.0–10.5)

## 2018-04-19 LAB — CMP (CANCER CENTER ONLY)
ALT: 46 U/L — ABNORMAL HIGH (ref 0–44)
AST: 47 U/L — ABNORMAL HIGH (ref 15–41)
Albumin: 3.4 g/dL — ABNORMAL LOW (ref 3.5–5.0)
Alkaline Phosphatase: 57 U/L (ref 38–126)
Anion gap: 8 (ref 5–15)
BILIRUBIN TOTAL: 0.4 mg/dL (ref 0.3–1.2)
BUN: 25 mg/dL — ABNORMAL HIGH (ref 8–23)
CHLORIDE: 103 mmol/L (ref 98–111)
CO2: 26 mmol/L (ref 22–32)
CREATININE: 0.7 mg/dL (ref 0.44–1.00)
Calcium: 9.1 mg/dL (ref 8.9–10.3)
Glucose, Bld: 84 mg/dL (ref 70–99)
Potassium: 5 mmol/L (ref 3.5–5.1)
Sodium: 137 mmol/L (ref 135–145)
TOTAL PROTEIN: 6.8 g/dL (ref 6.5–8.1)

## 2018-04-19 MED ORDER — HEPARIN SOD (PORK) LOCK FLUSH 100 UNIT/ML IV SOLN
500.0000 [IU] | Freq: Once | INTRAVENOUS | Status: DC
  Filled 2018-04-19: qty 5

## 2018-04-19 MED ORDER — SODIUM CHLORIDE 0.9% FLUSH
10.0000 mL | INTRAVENOUS | Status: DC | PRN
  Administered 2018-04-19: 12:00:00 via INTRAVENOUS
  Filled 2018-04-19: qty 10

## 2018-04-20 LAB — KAPPA/LAMBDA LIGHT CHAINS
KAPPA FREE LGHT CHN: 62.6 mg/L — AB (ref 3.3–19.4)
KAPPA, LAMDA LIGHT CHAIN RATIO: 22.36 — AB (ref 0.26–1.65)
LAMDA FREE LIGHT CHAINS: 2.8 mg/L — AB (ref 5.7–26.3)

## 2018-04-20 LAB — IGA: IGA: 188 mg/dL (ref 87–352)

## 2018-04-21 ENCOUNTER — Inpatient Hospital Stay (HOSPITAL_BASED_OUTPATIENT_CLINIC_OR_DEPARTMENT_OTHER): Payer: PPO | Admitting: Oncology

## 2018-04-21 ENCOUNTER — Telehealth: Payer: Self-pay | Admitting: Oncology

## 2018-04-21 ENCOUNTER — Encounter: Payer: Self-pay | Admitting: Oncology

## 2018-04-21 VITALS — BP 147/83 | HR 114 | Temp 98.6°F | Resp 17 | Ht 60.0 in | Wt 174.4 lb

## 2018-04-21 DIAGNOSIS — D649 Anemia, unspecified: Secondary | ICD-10-CM | POA: Diagnosis not present

## 2018-04-21 DIAGNOSIS — Z853 Personal history of malignant neoplasm of breast: Secondary | ICD-10-CM | POA: Diagnosis not present

## 2018-04-21 DIAGNOSIS — C9000 Multiple myeloma not having achieved remission: Secondary | ICD-10-CM

## 2018-04-21 DIAGNOSIS — F329 Major depressive disorder, single episode, unspecified: Secondary | ICD-10-CM

## 2018-04-21 DIAGNOSIS — Z5111 Encounter for antineoplastic chemotherapy: Secondary | ICD-10-CM | POA: Diagnosis not present

## 2018-04-21 DIAGNOSIS — Z79899 Other long term (current) drug therapy: Secondary | ICD-10-CM | POA: Diagnosis not present

## 2018-04-21 NOTE — Progress Notes (Signed)
  [name] OFFICE PROGRESS NOTE   Diagnosis: Multiple myeloma  INTERVAL HISTORY:   [name] returns for a scheduled visit.  She completed a second cycle of carfilzomib/Cytoxan/Decadron on 04/14/2018.  She decided to complete the third week of treatment after initially wanting to complete only 2 weeks.  She reports fatigue following treatment.  No neuropathy symptoms.  No nausea. She is scheduled to see Dr. Melburn Hake next week.  Objective:  Vital signs in last 24 hours:  Blood pressure (!) 147/83, pulse (!) 114, temperature 98.6 F (37 C), temperature source Oral, resp. rate 17, height 5' (1.524 m), weight 174 lb 6.4 oz (79.1 kg), SpO2 100 %.    HEENT: No thrush or ulcers Resp: Lungs clear bilaterally Cardio: Regular rate and rhythm GI: No hepatomegaly, nontender, no mass Vascular: The right lower leg is larger than the left side, no edema  Portacath/PICC-without erythema  Lab Results:  Lab Results  Component Value Date   WBC 6.1 04/19/2018   HGB 10.2 (L) 04/19/2018   HCT 33.0 (L) 04/19/2018   MCV 85.1 04/19/2018   PLT 207 04/19/2018   NEUTROABS 4.2 04/19/2018    CMP  Lab Results  Component Value Date   NA 137 04/19/2018   K 5.0 04/19/2018   CL 103 04/19/2018   CO2 26 04/19/2018   GLUCOSE 84 04/19/2018   BUN 25 (H) 04/19/2018   CREATININE 0.70 04/19/2018   CALCIUM 9.1 04/19/2018   PROT 6.8 04/19/2018   ALBUMIN 3.4 (L) 04/19/2018   AST 47 (H) 04/19/2018   ALT 46 (H) 04/19/2018   ALKPHOS 57 04/19/2018   BILITOT 0.4 04/19/2018   GFRNONAA >60 04/19/2018   GFRAA >60 04/19/2018   IgA 188, free kappa light chains 62.6, kappa/lambda ratio 22.36  Medications: I have reviewed the patient's current medications.   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 2.4 cm solid right thyroid mass, follow-up ultrasound recommended  Biopsy 01/14/2018-atypia of undetermined significance or follicular lesion of undetermined significance  Affirmatesting- likely a benign lesion  6.Right rib fractures 12/21/2017   Disposition: [name] appears stable.  She tolerated the second cycle of carfilzomib/Cytoxan/Decadron well.  The IgA and kappa light chains are lower.  She will meet with Dr. Melburn Hake on 04/26/2018 to discuss the indication for stem cell therapy.  We will consider continuing carfilzomib/Cytoxan/Decadron versus switching to a maintenance regimen based on the decision for stem cell therapy.  She will return for an office visit and Zometa on 04/28/2018.  15 minutes were spent with the patient today.  The majority of the time was used for counseling and coordination of care.  Betsy Coder, MD  04/21/2018  1:01 PM

## 2018-04-21 NOTE — Telephone Encounter (Signed)
Appts scheduled avs/calendar printed per 10/23 los °

## 2018-04-22 ENCOUNTER — Other Ambulatory Visit: Payer: Self-pay | Admitting: Hematology and Oncology

## 2018-04-22 DIAGNOSIS — C9000 Multiple myeloma not having achieved remission: Secondary | ICD-10-CM

## 2018-04-23 ENCOUNTER — Other Ambulatory Visit: Payer: Self-pay | Admitting: *Deleted

## 2018-04-23 MED ORDER — ACYCLOVIR 400 MG PO TABS: 800.0000 mg | ORAL_TABLET | Freq: Two times a day (BID) | ORAL | 0 refills | Status: DC

## 2018-04-23 MED ORDER — ACYCLOVIR 400 MG PO TABS: 400.0000 mg | ORAL_TABLET | Freq: Two times a day (BID) | ORAL | 0 refills | Status: DC

## 2018-04-26 ENCOUNTER — Telehealth: Payer: Self-pay | Admitting: Emergency Medicine

## 2018-04-26 DIAGNOSIS — C9000 Multiple myeloma not having achieved remission: Secondary | ICD-10-CM | POA: Diagnosis not present

## 2018-04-26 NOTE — Telephone Encounter (Signed)
Called patient per Dr.Sherrill request. He would like to offer the patient to come in @ 9:30 instead of 12pm on 10/30. VM left for patient to call back regarding this note.

## 2018-04-28 ENCOUNTER — Inpatient Hospital Stay: Payer: PPO

## 2018-04-28 ENCOUNTER — Inpatient Hospital Stay (HOSPITAL_BASED_OUTPATIENT_CLINIC_OR_DEPARTMENT_OTHER): Payer: PPO | Admitting: Oncology

## 2018-04-28 ENCOUNTER — Telehealth: Payer: Self-pay | Admitting: Oncology

## 2018-04-28 ENCOUNTER — Encounter: Payer: Self-pay | Admitting: Oncology

## 2018-04-28 DIAGNOSIS — Z79899 Other long term (current) drug therapy: Secondary | ICD-10-CM | POA: Diagnosis not present

## 2018-04-28 DIAGNOSIS — F329 Major depressive disorder, single episode, unspecified: Secondary | ICD-10-CM | POA: Diagnosis not present

## 2018-04-28 DIAGNOSIS — C9000 Multiple myeloma not having achieved remission: Secondary | ICD-10-CM | POA: Diagnosis not present

## 2018-04-28 DIAGNOSIS — D649 Anemia, unspecified: Secondary | ICD-10-CM

## 2018-04-28 DIAGNOSIS — Z853 Personal history of malignant neoplasm of breast: Secondary | ICD-10-CM

## 2018-04-28 DIAGNOSIS — Z5111 Encounter for antineoplastic chemotherapy: Secondary | ICD-10-CM | POA: Diagnosis not present

## 2018-04-28 NOTE — Progress Notes (Signed)
[place] OFFICE PROGRESS NOTE   Diagnosis: Multiple myeloma  INTERVAL HISTORY:   [name] returns as scheduled.  She saw Dr. Norma Fredrickson yesterday.  She reports he does not recommend proceeding with stem cell therapy.  He recommends changing treatment to daratumumab/pomalidomide/Decadron. She continues to have's sweats during the day.  No other complaint.  No fever.  Objective:  Vital signs in last 24 hours:  Blood pressure 123/77, pulse (!) 113, temperature 98.5 F (36.9 C), temperature source Oral, resp. rate 19, height 5' (1.524 m), weight 175 lb 9.6 oz (79.7 kg), SpO2 100 %.    HEENT: No thrush or ulcers Resp: Lungs clear bilaterally Cardio: Regular rate and rhythm GI: No hepatosplenomegaly, nontender Vascular: The left lower leg is larger than the right side, no edema   Portacath/PICC-without erythema  Lab Results:  Lab Results  Component Value Date   WBC 6.1 04/19/2018   HGB 10.2 (L) 04/19/2018   HCT 33.0 (L) 04/19/2018   MCV 85.1 04/19/2018   PLT 207 04/19/2018   NEUTROABS 4.2 04/19/2018    CMP  Lab Results  Component Value Date   NA 137 04/19/2018   K 5.0 04/19/2018   CL 103 04/19/2018   CO2 26 04/19/2018   GLUCOSE 84 04/19/2018   BUN 25 (H) 04/19/2018   CREATININE 0.70 04/19/2018   CALCIUM 9.1 04/19/2018   PROT 6.8 04/19/2018   ALBUMIN 3.4 (L) 04/19/2018   AST 47 (H) 04/19/2018   ALT 46 (H) 04/19/2018   ALKPHOS 57 04/19/2018   BILITOT 0.4 04/19/2018   GFRNONAA >60 04/19/2018   GFRAA >60 04/19/2018     Medications: I have reviewed the patient's current medications.   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 09/22/2017  57% plasma cells on initial bone marrow biopsy, kappa light chain restricted  Normal cytogenetics, FISH with -4, -14, and 13q-abnormalities  PET scan 10/16/2017-negative for bone lesions  Cycle 1 RVD 10/23/2017  Cycle 2 RVD 11/13/2017  Cycle 3 RVD 12/04/2017  (Revlimid dose reduced to 15 mg)  Cycle 4 RVD 12/29/2017  Cycle 5 RVD 01/26/2018  Cycle 1 carfilzomib/Cytoxan/Decadron 03/02/2018 (day 16 treatment held secondary to patient preference)  Cycle 2 carfilzomib/Cytoxan/Decadron 03/30/2018  Improvement, but persistent elevation of the kappa/lambda ratio, transplant team recommended changing treatment to daratumumab/pomalidomide/Decadron  2.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 2.4 cm solid right thyroid mass, follow-up ultrasound recommended  Biopsy 01/14/2018-atypia of undetermined significance or follicular lesion of undetermined significance  Affirmatesting- likely a benign lesion  6.Right rib fractures 12/21/2017    Disposition: [name] appears unchanged.  She saw Dr. Melburn Hake yesterday.  She has written instructions stating a recommendation for changing treatment to daratumumab/pomalidomide/Decadron. The plan is to begin treatment on 05/11/2018.  We reviewed potential toxicities associated with this regimen including the chance for hematologic toxicity, nausea, diarrhea, infection, thromboembolic disease, dyspnea, and an allergic reaction.  She has reviewed the regimen with Dr. Nonah Mattes well.  [name] will be contacted by the Cancer center pharmacy to discuss toxicities associated with this regimen and arrange for delivery of the pomalidomide.  We will check a baseline myeloma panel when she returns on 05/11/2018.  She will be scheduled for an office visit in [place] on 05/18/2018.  I will contact Dr. Melburn Hake to discuss  the case.  Another treatment option is to continue the carfilzomib/Cytoxan/Decadron regimen for a few more cycles.  40 minutes were spent with  the patient today.  The majority of the time was used for counseling and coordination of care.  Betsy Coder, MD  04/28/2018  4:34 PM

## 2018-04-28 NOTE — Progress Notes (Signed)
Patient left voicemail regarding two new treatment drugs and drug assistance.  Verified with Clarise Cruz RN that these would be medications switched to. Confirmed with Lenise patient still has copay assistance available through [employer] for treatment drugs.  Called patient once treamnent plan was in to advise she still has available funds through [employer] for treatment drugs and they would cover copay as long as insurance pays their portion. Patient states [employer] denied claims she sent to them. Provided patient with Lenise's name and contact number and advised her she sends the claims to [employer] for reimbursement on behalf of patients whom have assistance. She states she will call Lenise tomorrow.  She appreciated my call and has my contact information for any additional financial questions or concerns.

## 2018-04-28 NOTE — Telephone Encounter (Signed)
Appts scheduled avs/calendar printed per 10/30 los °

## 2018-04-28 NOTE — Progress Notes (Unsigned)
Checked scheduling and lobby again for patient - NO answer.

## 2018-04-28 NOTE — Progress Notes (Unsigned)
Attempted to call patient back into infusion 3 x in the last 25 minutes. No answer.  Pt not found in scheduling or lobby.

## 2018-04-29 ENCOUNTER — Telehealth: Payer: Self-pay | Admitting: *Deleted

## 2018-04-29 ENCOUNTER — Encounter: Payer: Self-pay | Admitting: Oncology

## 2018-04-29 ENCOUNTER — Telehealth: Payer: Self-pay

## 2018-04-29 ENCOUNTER — Telehealth: Payer: Self-pay | Admitting: Pharmacist

## 2018-04-29 DIAGNOSIS — C9000 Multiple myeloma not having achieved remission: Secondary | ICD-10-CM

## 2018-04-29 MED ORDER — DIAZEPAM 2 MG PO TABS: 2.0000 mg | ORAL_TABLET | Freq: Every day | ORAL | 0 refills | Status: DC | PRN

## 2018-04-29 NOTE — Telephone Encounter (Signed)
Oral Oncology Patient Advocate Encounter  Received notification from [facility] that prior authorization for Pomalyst is required.  PA submitted on CoverMyMeds Key [id number] Status is pending  Oral Oncology Clinic will continue to follow.  [place] Patient [place] Phone [phone] Fax [phone]

## 2018-04-29 NOTE — Telephone Encounter (Signed)
Oral Oncology Pharmacist Encounter  Received coverage determination request form from [facility]. Form completed and faxed back to [phone].  Johny Drilling, PharmD, BCPS, BCOP  04/29/2018 3:04 PM Oral Oncology Clinic [phone]

## 2018-04-29 NOTE — Telephone Encounter (Signed)
Oral Oncology Pharmacist Encounter  Received new prescription for Pomalyst (pomalidomide) for the treatment of multiple myeloma not having achieved remission in combination with dexamethasone and daratumumab, planned duration until disease progression or unacceptable toxicity.  Original diagnosis in March 2019. Patient received 5 cycles of induction therapy with Revlimid, Velcade, and dexamethasone from April to July 2019. Treatment was changed in early September 2019 to carfilzomib, cyclophosphamide, and dexamethasone as patient had plateaued on RVD treatment. Patient completed 2 cycles on 04/14/2018 prior to further evaluation for stem cell transplant.  After repeat visit with Dr. Norma Fredrickson at [facility], plan is now to change to treatment with daratumumab, pomalidomide, and dexamethasone and instead of pursuing stem cell transplant.  Labs from 04/19/18 assessed, okay for treatment. Noted slight elevations in LFTs, this will continue to be monitored.  SCr=0.7, round to 1.0 for age > 39, est CrCl ~ 65 mL/min No dose adjustment recommended by manufacturer for slight renal dysfunction Noted dose reduction of pomalidomide to 3 mg daily for 3 weeks on, one-week off for increased tolerance  Current medication list in Epic reviewed, no significant DDIs with Pomalyst identified:  Patient will be counseled about increased risk of CNS depression with the addition of Pomalyst to current medication list.  Prescription will be E scribed to appropriate dispensing pharmacy once insurance authorization is obtained. Message left for Dr. Gearldine Shown collaborative practice RN to obtain [employer] authorization number prior to sending Pomalyst prescription.  Pomalyst is a limited distribution medication. Patient was receiving Revlimid at [facility]. We will likely send Pomalyst to same dispensing pharmacy.  Oral Oncology Clinic will continue to follow for insurance authorization, copayment issues,  initial counseling and start date.  Johny Drilling, PharmD, BCPS, BCOP  04/29/2018 8:59 AM Oral Oncology Clinic [phone]

## 2018-04-29 NOTE — Telephone Encounter (Signed)
Patient notified that Zometa infusion will be added to appt on 11/12. Patient agrees to come in for pre meds and understands there is a 1 hour delay between pre meds and start of darzalex. She understands that if she tolerates the first 2 treatments this can be changed to a rapid darzalex but the first 2 treatments will take longer. Refill of Valuim called into her pharmacy.

## 2018-04-30 MED ORDER — POMALIDOMIDE 3 MG PO CAPS: 3.0000 mg | ORAL_CAPSULE | Freq: Every day | ORAL | 0 refills | Status: DC

## 2018-04-30 NOTE — Telephone Encounter (Signed)
Oral Chemotherapy Pharmacist Encounter   I spoke with patient for overview of: Pomalyst (pomalidomide) for the treatment of multiple myeloma not having achieved remission in combination with dexamethasone and daratumumab, planned duration until disease progression or unacceptable toxicity.  Counseled patient on administration, dosing, side effects, monitoring, drug-food interactions, safe handling, storage, and disposal.  Patient will take Pomalyst 42m capsules, 1 capsule by mouth once daily, without regard to food, with a full glass of water.  Patient questions why her Pomalyst dose was decreased to 347mdaily from full dose of 51m81maily. She states she will follow-up with Drs. Sherrill and RodNorma Fredricksonout this decrease.  Pomalyst will be given 21 days on, 7 days off, repeat every 28 days.  Patient states she is agreeable to taking her dexamethasone in the office on 05/11/2018 however is in need of a new dexamethasone prescription to reflect updated dexamethasone dosing. Patient also requests prescription for Singulair to be called into [facility].  Daratumumab will be infused at 16 mg/kg once weekly x 8 doses, then once every 2 weeks x 8 doses, then once every 4 weeks until discontinuation.  Pomalyst and daratumumab start date: 05/11/2018  Adverse effects of Pomalyst include but are not limited to: nausea, constipation, diarrhea, abdominal pain, rash, fatigue, drug fever, peripheral edema, and decreased blood counts.    Reviewed with patient importance of keeping a medication schedule and plan for any missed doses.  Ms. ColYehiced understanding and appreciation.   All questions answered. Medication reconciliation performed and medication/allergy list updated.  Patient will continue on acyclovir for VZV prophylaxis and states that both Dr. RodNorma Fredricksond Dr. SheBenay Spiceld her she can discontinue this medication in 1 month. Patient states she continues on aspirin for  thromboprophylaxis.  Patient updated about [facility] as dispensing pharmacy for Pomalyst. She states she was satisfied with their service when they were dispensing her Revlimid.  Patient informed that prescription for Pomalyst has just been E scribed to dispensing pharmacy and that pharmacy will be reaching out to her at the early part of the week of 05/03/2018 to report copayment information and to schedule her first shipment of Pomalyst.  Patient knows to call the office with questions or concerns. Oral Oncology Clinic will continue to follow.  JesJohny DrillingharmD, BCPS, BCOP  04/30/2018   3:33 PM Oral Oncology Clinic [phone]

## 2018-04-30 NOTE — Telephone Encounter (Signed)
Oral Oncology Patient Advocate Encounter  Prior Authorization for Pomalyst has been approved.    PA# [id number] Effective dates: 04/29/18 through 06/29/18  Oral Oncology Clinic will continue to follow.   [place] Patient [place] Phone [phone] Fax [phone]

## 2018-05-03 ENCOUNTER — Telehealth: Payer: Self-pay | Admitting: *Deleted

## 2018-05-03 MED ORDER — MONTELUKAST SODIUM 10 MG PO TABS: ORAL_TABLET | ORAL | 0 refills | Status: DC

## 2018-05-03 NOTE — Telephone Encounter (Signed)
Patient called expressing distress that her specialty pharmacy [facility] has not received the Pomalyst script from from our office yet. She provided fax # [phone] to send fax and informed nurse that an authorization number needs to be obtained from [employer] at [phone].  Informed her that Clarise Cruz, RN had already done this last week. Will follow up with oral chemotherapy pharmacist as to status. Emeline Darling not in office, so message left for Daleen Snook at [phone] to follow up and call patient.

## 2018-05-04 NOTE — Telephone Encounter (Signed)
Oral Oncology Pharmacist Encounter  Received voicemail from patient as well as message from collaborative practice RN that patient is having issues with Pomalyst acquisition.  Per patient's message, dispensing pharmacy has not received Pomalyst prescription and did not have associated [employer] authorization number for Pomalyst. I verified with collaborative practice RN that [employer] authorization # for Pomalyst has been obtained. Prescription for Pomalyst was E scribed to dispensing pharmacy, including [place] authorization number, on Friday (04/30/2018).  I called [facility] pharmacy to follow-up on patient's Pomalyst prescription. Per representative, there have been glitches in their [employer] dispensing system that has placed a hold on patient's Pomalyst prescription. This glitch should be resolved before the end of business today, and then they will be able to reach out to patient to schedule shipment of her Pomalyst. The pharmacy still has [employer] information on file, and will use this to cover any out-of-pocket expenses that may be due at the pharmacy for patient's Pomalyst.  I called and updated patient on above information.  Patient informed that prescription for Singulair has been E scribed to [facility].  We discussed premedications for daratumumab and that patient will actually received 4 medications prior to her daratumumab infusion (acetaminophen + diphenhydramine + dexamethasone + montelukast). She will receive all 4 of these medications in the infusion room an hour before her daratumumab infusion. Dr. Benay Spice may be comfortable with her taking these premedications at home prior to daratumumab infusions once she is stable on current regimen. She will be receiving these medications while in the infusion room at least for the first few daratumumab infusions.  We discussed dexamethasone prescription. It still has refills available at [facility]. She is to  take her dexamethasone 20 mg the day after daratumumab at home, even though her current prescription states to take on the day of chemotherapy. She is comfortable using current dexamethasone prescription and knowing to take it the day after daratumumab infusion.  Patient with questions about any drug interactions between her cholestyramine and her Pomalyst. Patient informed that the cholestyramine does not appear that it will bind her Pomalyst and they are safe to take together. Patient states she will likely separate administration of Pomalyst and cholestyramine just to ensure that she gets maximum absorption of her Pomalyst.  Patient states she will reach out to dispensing pharmacy later this afternoon if she has not yet heard from them. She will call them again tomorrow to ensure she receives her medication on time.  All questions answered. Patient expressed understanding and appreciation. She knows to call the office with any additional questions or concerns.  Johny Drilling, PharmD, BCPS, BCOP  05/04/2018 1:42 PM Oral Oncology Clinic [phone]

## 2018-05-05 ENCOUNTER — Telehealth: Payer: Self-pay | Admitting: *Deleted

## 2018-05-05 NOTE — Telephone Encounter (Signed)
Notified by pharmacist, Emeline Darling that pharmacy was ready to dispense the pomalyst, but the patient's survey was "flagged". Need to call [employer] to follow up. Called [employer] and was informed the issue with the patient survey has been resolved and it should be ready to be dispensed. Was transferred to [facility] [phone]). Was on hold for > 20 minutes and had to disconnect. Notified Emeline Darling, PharmD.

## 2018-05-07 ENCOUNTER — Telehealth: Payer: Self-pay | Admitting: *Deleted

## 2018-05-07 NOTE — Telephone Encounter (Signed)
Oral Oncology Patient Advocate Encounter  Confirmed with [facility] that Pomalyst was delivered 11/71/19 with a $0 copay  [place] Patient [place] Phone [phone] Fax [phone]

## 2018-05-07 NOTE — Telephone Encounter (Signed)
At request of pharmacy called patient to request she have her labs on 11/11 so results will be available for treatment. Informed her that the first treatment of daratumumab can take up to 8 hours, and doing so could expedite her treatment. She refused to come in day early for labs and is comfortable with a long day in infusion if necessary. Dr. Benay Spice reports OK to begin treatment on 11/12 before lab results are released. Labs are primarily for baseline only. She was also made aware that Dr. Benay Spice received the office note from Dr. Norma Fredrickson and it confirmed everything that they had discussed. Inquired if she has received her Pomalyst yet-she states that [employer] "claims" they delivered it, but she can't find it. They are working on tracking it now.

## 2018-05-08 ENCOUNTER — Other Ambulatory Visit: Payer: Self-pay | Admitting: Oncology

## 2018-05-11 ENCOUNTER — Other Ambulatory Visit: Payer: Self-pay | Admitting: *Deleted

## 2018-05-11 ENCOUNTER — Other Ambulatory Visit: Payer: Self-pay | Admitting: Oncology

## 2018-05-11 ENCOUNTER — Ambulatory Visit: Payer: Self-pay | Admitting: Medical

## 2018-05-11 ENCOUNTER — Inpatient Hospital Stay: Payer: PPO | Attending: Hematology and Oncology

## 2018-05-11 ENCOUNTER — Inpatient Hospital Stay: Payer: PPO

## 2018-05-11 VITALS — BP 120/81 | HR 99 | Temp 99.1°F | Resp 16

## 2018-05-11 DIAGNOSIS — D649 Anemia, unspecified: Secondary | ICD-10-CM | POA: Insufficient documentation

## 2018-05-11 DIAGNOSIS — Z79899 Other long term (current) drug therapy: Secondary | ICD-10-CM | POA: Diagnosis not present

## 2018-05-11 DIAGNOSIS — C9000 Multiple myeloma not having achieved remission: Secondary | ICD-10-CM

## 2018-05-11 DIAGNOSIS — Z5111 Encounter for antineoplastic chemotherapy: Secondary | ICD-10-CM | POA: Diagnosis present

## 2018-05-11 DIAGNOSIS — Z853 Personal history of malignant neoplasm of breast: Secondary | ICD-10-CM | POA: Insufficient documentation

## 2018-05-11 LAB — CBC WITH DIFFERENTIAL (CANCER CENTER ONLY)
Abs Immature Granulocytes: 0.01 10*3/uL (ref 0.00–0.07)
BASOS PCT: 0 %
Basophils Absolute: 0 10*3/uL (ref 0.0–0.1)
EOS ABS: 0.1 10*3/uL (ref 0.0–0.5)
EOS PCT: 2 %
HCT: 30.1 % — ABNORMAL LOW (ref 36.0–46.0)
HEMOGLOBIN: 8.9 g/dL — AB (ref 12.0–15.0)
IMMATURE GRANULOCYTES: 0 %
Lymphocytes Relative: 31 %
Lymphs Abs: 1.2 10*3/uL (ref 0.7–4.0)
MCH: 25.1 pg — AB (ref 26.0–34.0)
MCHC: 29.6 g/dL — AB (ref 30.0–36.0)
MCV: 85 fL (ref 80.0–100.0)
MONOS PCT: 13 %
Monocytes Absolute: 0.5 10*3/uL (ref 0.1–1.0)
NEUTROS PCT: 54 %
NRBC: 0 % (ref 0.0–0.2)
Neutro Abs: 2.1 10*3/uL (ref 1.7–7.7)
Platelet Count: 197 10*3/uL (ref 150–400)
RBC: 3.54 MIL/uL — ABNORMAL LOW (ref 3.87–5.11)
RDW: 17.1 % — ABNORMAL HIGH (ref 11.5–15.5)
WBC Count: 4 10*3/uL (ref 4.0–10.5)

## 2018-05-11 LAB — CMP (CANCER CENTER ONLY)
ALK PHOS: 50 U/L (ref 38–126)
ALT: 36 U/L (ref 0–44)
ANION GAP: 11 (ref 5–15)
AST: 36 U/L (ref 15–41)
Albumin: 3.2 g/dL — ABNORMAL LOW (ref 3.5–5.0)
BILIRUBIN TOTAL: 0.4 mg/dL (ref 0.3–1.2)
BUN: 20 mg/dL (ref 8–23)
CALCIUM: 8.7 mg/dL — AB (ref 8.9–10.3)
CO2: 25 mmol/L (ref 22–32)
CREATININE: 0.75 mg/dL (ref 0.44–1.00)
Chloride: 104 mmol/L (ref 98–111)
Glucose, Bld: 160 mg/dL — ABNORMAL HIGH (ref 70–99)
Potassium: 4.1 mmol/L (ref 3.5–5.1)
SODIUM: 140 mmol/L (ref 135–145)
TOTAL PROTEIN: 6 g/dL — AB (ref 6.5–8.1)

## 2018-05-11 LAB — TYPE AND SCREEN
ABO/RH(D): A POS
Antibody Screen: NEGATIVE

## 2018-05-11 LAB — DARATUMUMAB PRETREATMENT RBC PHENOTYPE

## 2018-05-11 MED ORDER — MONTELUKAST SODIUM 10 MG PO TABS
10.0000 mg | ORAL_TABLET | Freq: Once | ORAL | Status: AC
  Administered 2018-05-11: 10 mg via ORAL

## 2018-05-11 MED ORDER — PROCHLORPERAZINE MALEATE 10 MG PO TABS: 10.0000 mg | ORAL_TABLET | Freq: Four times a day (QID) | ORAL | 1 refills | Status: AC | PRN

## 2018-05-11 MED ORDER — SODIUM CHLORIDE 0.9% FLUSH
10.0000 mL | Freq: Once | INTRAVENOUS | Status: AC | PRN
  Administered 2018-05-11: 10 mL
  Filled 2018-05-11: qty 10

## 2018-05-11 MED ORDER — FAMOTIDINE IN NACL 20-0.9 MG/50ML-% IV SOLN
20.0000 mg | Freq: Once | INTRAVENOUS | Status: AC | PRN
  Administered 2018-05-11: 20 mg via INTRAVENOUS
  Filled 2018-05-11: qty 50

## 2018-05-11 MED ORDER — SODIUM CHLORIDE 0.9 % IV SOLN
Freq: Once | INTRAVENOUS | Status: AC
  Administered 2018-05-11: 09:00:00 via INTRAVENOUS
  Filled 2018-05-11: qty 250

## 2018-05-11 MED ORDER — DIPHENHYDRAMINE HCL 25 MG PO CAPS
ORAL_CAPSULE | ORAL | Status: AC
  Filled 2018-05-11: qty 2

## 2018-05-11 MED ORDER — MONTELUKAST SODIUM 10 MG PO TABS
ORAL_TABLET | ORAL | Status: AC
  Filled 2018-05-11: qty 1

## 2018-05-11 MED ORDER — SODIUM CHLORIDE 0.9% FLUSH
10.0000 mL | INTRAVENOUS | Status: DC | PRN
  Administered 2018-05-11: 10 mL
  Filled 2018-05-11: qty 10

## 2018-05-11 MED ORDER — ACETAMINOPHEN 325 MG PO TABS
650.0000 mg | ORAL_TABLET | Freq: Once | ORAL | Status: AC
  Administered 2018-05-11: 650 mg via ORAL

## 2018-05-11 MED ORDER — HEPARIN SOD (PORK) LOCK FLUSH 100 UNIT/ML IV SOLN
500.0000 [IU] | Freq: Once | INTRAVENOUS | Status: AC | PRN
  Administered 2018-05-11: 500 [IU]
  Filled 2018-05-11: qty 5

## 2018-05-11 MED ORDER — SODIUM CHLORIDE 0.9 % IV SOLN
16.2000 mg/kg | Freq: Once | INTRAVENOUS | Status: AC
  Administered 2018-05-11: 1300 mg via INTRAVENOUS
  Filled 2018-05-11: qty 60

## 2018-05-11 MED ORDER — DIPHENHYDRAMINE HCL 25 MG PO CAPS
50.0000 mg | ORAL_CAPSULE | Freq: Once | ORAL | Status: AC
  Administered 2018-05-11: 50 mg via ORAL

## 2018-05-11 MED ORDER — SODIUM CHLORIDE 0.9 % IV SOLN
20.0000 mg | Freq: Once | INTRAVENOUS | Status: AC
  Administered 2018-05-11: 20 mg via INTRAVENOUS
  Filled 2018-05-11: qty 2

## 2018-05-11 MED ORDER — ACETAMINOPHEN 325 MG PO TABS
ORAL_TABLET | ORAL | Status: AC
  Filled 2018-05-11: qty 2

## 2018-05-11 NOTE — Progress Notes (Signed)
Patient observed with new onset of cough.  Medication paused, NS administered per hypersensitivity protocol. Patient denies chest pain, or shortness of breath.  Redness noted to nasal and cheek area. Sandi Mealy, PA-C assessed patient in infusion room.   Pepcid 20mg  IVPB initiated.  Cough subsided after completion of Pepcid.

## 2018-05-11 NOTE — Patient Instructions (Signed)
[facility] Discharge Instructions for Patients Receiving Chemotherapy  Today you received the following chemotherapy agents: Daratumumab (Darzalex).  To help prevent nausea and vomiting after your treatment, we encourage you to take your nausea medication as directed.   If you develop nausea and vomiting that is not controlled by your nausea medication, call the clinic.   BELOW ARE SYMPTOMS THAT SHOULD BE REPORTED IMMEDIATELY:  *FEVER GREATER THAN 100.5 F  *CHILLS WITH OR WITHOUT FEVER  NAUSEA AND VOMITING THAT IS NOT CONTROLLED WITH YOUR NAUSEA MEDICATION  *UNUSUAL SHORTNESS OF BREATH  *UNUSUAL BRUISING OR BLEEDING  TENDERNESS IN MOUTH AND THROAT WITH OR WITHOUT PRESENCE OF ULCERS  *URINARY PROBLEMS  *BOWEL PROBLEMS  UNUSUAL RASH Items with * indicate a potential emergency and should be followed up as soon as possible.  Feel free to call the clinic should you have any questions or concerns. The clinic phone number is [phone].  Please show the [place] at check-in to the Emergency Department and triage nurse.  Daratumumab injection What is this medicine? DARATUMUMAB (dar a toom ue mab) is a monoclonal antibody. It is used to treat multiple myeloma. This medicine may be used for other purposes; ask your health care provider or pharmacist if you have questions. COMMON BRAND NAME(S): DARZALEX What should I tell my health care provider before I take this medicine? They need to know if you have any of these conditions: -infection (especially a virus infection such as chickenpox, cold sores, or herpes) -lung or breathing disease -pregnant or trying to get pregnant -breast-feeding -an unusual or allergic reaction to daratumumab, other medicines, foods, dyes, or preservatives How should I use this medicine? This medicine is for infusion into a vein. It is given by a health care professional in a hospital or clinic setting. Talk to your  pediatrician regarding the use of this medicine in children. Special care may be needed. Overdosage: If you think you have taken too much of this medicine contact a poison control center or emergency room at once. NOTE: This medicine is only for you. Do not share this medicine with others. What if I miss a dose? Keep appointments for follow-up doses as directed. It is important not to miss your dose. Call your doctor or health care professional if you are unable to keep an appointment. What may interact with this medicine? Interactions have not been studied. Give your health care provider a list of all the medicines, herbs, non-prescription drugs, or dietary supplements you use. Also tell them if you smoke, drink alcohol, or use illegal drugs. Some items may interact with your medicine. This list may not describe all possible interactions. Give your health care provider a list of all the medicines, herbs, non-prescription drugs, or dietary supplements you use. Also tell them if you smoke, drink alcohol, or use illegal drugs. Some items may interact with your medicine. What should I watch for while using this medicine? This drug may make you feel generally unwell. Report any side effects. Continue your course of treatment even though you feel ill unless your doctor tells you to stop. This medicine can cause serious allergic reactions. To reduce your risk you may need to take medicine before treatment with this medicine. Take your medicine as directed. This medicine can affect the results of blood tests to match your blood type. These changes can last for up to 6 months after the final dose. Your healthcare provider will do blood tests to match your blood  type before you start treatment. Tell all of your healthcare providers that you are being treated with this medicine before receiving a blood transfusion. This medicine can affect the results of some tests used to determine treatment response; extra  tests may be needed to evaluate response. Do not become pregnant while taking this medicine or for 3 months after stopping it. Women should inform their doctor if they wish to become pregnant or think they might be pregnant. There is a potential for serious side effects to an unborn child. Talk to your health care professional or pharmacist for more information. What side effects may I notice from receiving this medicine? Side effects that you should report to your doctor or health care professional as soon as possible: -allergic reactions like skin rash, itching or hives, swelling of the face, lips, or tongue -breathing problems -chills -cough -dizziness -feeling faint or lightheaded -headache -low blood counts - this medicine may decrease the number of white blood cells, red blood cells and platelets. You may be at increased risk for infections and bleeding. -nausea, vomiting -shortness of breath -signs of decreased platelets or bleeding - bruising, pinpoint red spots on the skin, black, tarry stools, blood in the urine -signs of decreased red blood cells - unusually weak or tired, feeling faint or lightheaded, falls -signs of infection - fever or chills, cough, sore throat, pain or difficulty passing urine Side effects that usually do not require medical attention (report to your doctor or health care professional if they continue or are bothersome): -back pain -diarrhea -muscle cramps -pain, tingling, numbness in the hands or feet -swelling of the ankles, feet, hands -tiredness This list may not describe all possible side effects. Call your doctor for medical advice about side effects. You may report side effects to FDA at 1-800-FDA-1088. Where should I keep my medicine? Keep out of the reach of children. This drug is given in a hospital or clinic and will not be stored at home. NOTE: This sheet is a summary. It may not cover all possible information. If you have questions about this  medicine, talk to your doctor, pharmacist, or health care provider.  2018 Elsevier/Gold Standard (2015-07-19 10:38:11)

## 2018-05-11 NOTE — Progress Notes (Signed)
Lab will be sending tube for RBC phenotype to be drawn in infusion.

## 2018-05-12 ENCOUNTER — Telehealth: Payer: Self-pay | Admitting: *Deleted

## 2018-05-12 DIAGNOSIS — M47817 Spondylosis without myelopathy or radiculopathy, lumbosacral region: Secondary | ICD-10-CM | POA: Diagnosis not present

## 2018-05-12 DIAGNOSIS — M4807 Spinal stenosis, lumbosacral region: Secondary | ICD-10-CM | POA: Diagnosis not present

## 2018-05-12 DIAGNOSIS — F329 Major depressive disorder, single episode, unspecified: Secondary | ICD-10-CM | POA: Diagnosis not present

## 2018-05-12 DIAGNOSIS — G894 Chronic pain syndrome: Secondary | ICD-10-CM | POA: Diagnosis not present

## 2018-05-12 LAB — ABO/RH: ABO/RH(D): A POS

## 2018-05-12 NOTE — Telephone Encounter (Signed)
Patient called to request her labs via port be drawn on 05/17/18 instead of 11/19. She did not want to leave her port accessed for tx next day. She wished to confirm what labs will be drawn-CBC only per MD. Expressed concern about having another reaction. She was informed that MD will add the IV Pepcid to her premeds. Reminded her to take her singular that morning before she leaves the house and she agrees. Also made her aware that per MD, the pharmacy will dilute her daratumumab in 1 liter fluid.  Scheduling message sent for requested lab appointment change.

## 2018-05-14 ENCOUNTER — Telehealth: Payer: Self-pay

## 2018-05-14 NOTE — Progress Notes (Signed)
    DATE:  05/11/2018                                          X CHEMO/IMMUNOTHERAPY REACTION              MD: Dr. Dominica Severin B. Sherrill   AGENT/BLOOD PRODUCT RECEIVING TODAY:               Darzalex   AGENT/BLOOD PRODUCT RECEIVING IMMEDIATELY PRIOR TO REACTION:           Darzalex   VS: BP:      120/81   P:        99       SPO2:        99% on room air                  REACTION(S):            Cough and reddening of the nose (Despite the patient's reported reactions she continued to be able to eat a chicken sandwich while her assessment was taking place.)   PREMEDS:      Tylenol, Benadryl, dexamethasone, and Singulair   INTERVENTION: Pepcid 20 mg IV x1   Review of Systems  Review of Systems  Constitutional: Negative for chills, diaphoresis and fever.  HENT: Negative for trouble swallowing and voice change.   Respiratory: Positive for cough. Negative for chest tightness, shortness of breath and wheezing.   Cardiovascular: Negative for chest pain and palpitations.  Gastrointestinal: Negative for abdominal pain, constipation, diarrhea, nausea and vomiting.  Musculoskeletal: Negative for back pain and myalgias.  Skin: Positive for color change.  Neurological: Negative for dizziness, light-headedness and headaches.     Physical Exam  Physical Exam  Constitutional: No distress.  HENT:  Head: Normocephalic and atraumatic.  Cardiovascular: Normal rate, regular rhythm and normal heart sounds. Exam reveals no gallop and no friction rub.  No murmur heard. Pulmonary/Chest: Effort normal and breath sounds normal. No respiratory distress. She has no wheezes. She has no rales.  Neurological: She is alert.  Skin: Skin is warm and dry. No rash noted. She is not diaphoretic. There is erythema (Tip of the nose).    OUTCOME:                 The patient responded to Pepcid and and was able to complete her infusion of Darzalex without any further issues of concern.   Sandi Mealy, MHS, PA-C

## 2018-05-14 NOTE — Telephone Encounter (Signed)
Per 11/15 vm return calls. Spoke with patient and she requested lab, and port be done a day before her infusion due to the length of time of the treatment.

## 2018-05-16 ENCOUNTER — Other Ambulatory Visit: Payer: Self-pay | Admitting: Oncology

## 2018-05-17 ENCOUNTER — Inpatient Hospital Stay: Payer: PPO

## 2018-05-17 DIAGNOSIS — C9000 Multiple myeloma not having achieved remission: Secondary | ICD-10-CM

## 2018-05-17 DIAGNOSIS — D649 Anemia, unspecified: Secondary | ICD-10-CM | POA: Diagnosis not present

## 2018-05-17 LAB — CBC WITH DIFFERENTIAL (CANCER CENTER ONLY)
ABS IMMATURE GRANULOCYTES: 0.01 10*3/uL (ref 0.00–0.07)
Basophils Absolute: 0 10*3/uL (ref 0.0–0.1)
Basophils Relative: 0 %
Eosinophils Absolute: 0.2 10*3/uL (ref 0.0–0.5)
Eosinophils Relative: 3 %
HEMATOCRIT: 30.8 % — AB (ref 36.0–46.0)
Hemoglobin: 9.1 g/dL — ABNORMAL LOW (ref 12.0–15.0)
Immature Granulocytes: 0 %
LYMPHS ABS: 0.8 10*3/uL (ref 0.7–4.0)
Lymphocytes Relative: 16 %
MCH: 24.9 pg — AB (ref 26.0–34.0)
MCHC: 29.5 g/dL — ABNORMAL LOW (ref 30.0–36.0)
MCV: 84.2 fL (ref 80.0–100.0)
MONO ABS: 0.3 10*3/uL (ref 0.1–1.0)
MONOS PCT: 6 %
NEUTROS PCT: 75 %
Neutro Abs: 3.5 10*3/uL (ref 1.7–7.7)
Platelet Count: 193 10*3/uL (ref 150–400)
RBC: 3.66 MIL/uL — ABNORMAL LOW (ref 3.87–5.11)
RDW: 16.9 % — ABNORMAL HIGH (ref 11.5–15.5)
WBC Count: 4.7 10*3/uL (ref 4.0–10.5)
nRBC: 0 % (ref 0.0–0.2)

## 2018-05-18 ENCOUNTER — Inpatient Hospital Stay (HOSPITAL_BASED_OUTPATIENT_CLINIC_OR_DEPARTMENT_OTHER): Payer: PPO | Admitting: Nurse Practitioner

## 2018-05-18 ENCOUNTER — Encounter: Payer: Self-pay | Admitting: Nurse Practitioner

## 2018-05-18 ENCOUNTER — Inpatient Hospital Stay: Payer: PPO | Attending: Hematology and Oncology

## 2018-05-18 ENCOUNTER — Other Ambulatory Visit: Payer: Self-pay | Admitting: Nurse Practitioner

## 2018-05-18 ENCOUNTER — Other Ambulatory Visit: Payer: Self-pay

## 2018-05-18 VITALS — BP 110/59 | HR 102 | Temp 98.1°F | Resp 18 | Ht 60.0 in | Wt 184.4 lb

## 2018-05-18 VITALS — BP 110/85 | HR 82 | Temp 98.1°F | Resp 16

## 2018-05-18 DIAGNOSIS — Z79899 Other long term (current) drug therapy: Secondary | ICD-10-CM | POA: Diagnosis not present

## 2018-05-18 DIAGNOSIS — C9000 Multiple myeloma not having achieved remission: Secondary | ICD-10-CM | POA: Diagnosis not present

## 2018-05-18 DIAGNOSIS — D649 Anemia, unspecified: Secondary | ICD-10-CM | POA: Diagnosis not present

## 2018-05-18 DIAGNOSIS — Z853 Personal history of malignant neoplasm of breast: Secondary | ICD-10-CM | POA: Diagnosis not present

## 2018-05-18 MED ORDER — SODIUM CHLORIDE 0.9 % IV SOLN
Freq: Once | INTRAVENOUS | Status: AC
  Administered 2018-05-18: 11:00:00 via INTRAVENOUS
  Filled 2018-05-18: qty 250

## 2018-05-18 MED ORDER — ACETAMINOPHEN 325 MG PO TABS
ORAL_TABLET | ORAL | Status: AC
  Filled 2018-05-18: qty 2

## 2018-05-18 MED ORDER — DIPHENHYDRAMINE HCL 25 MG PO CAPS
ORAL_CAPSULE | ORAL | Status: AC
  Filled 2018-05-18: qty 2

## 2018-05-18 MED ORDER — MONTELUKAST SODIUM 10 MG PO TABS
10.0000 mg | ORAL_TABLET | Freq: Once | ORAL | Status: AC
  Administered 2018-05-18: 10 mg via ORAL

## 2018-05-18 MED ORDER — SODIUM CHLORIDE 0.9 % IV SOLN
20.0000 mg | Freq: Once | INTRAVENOUS | Status: AC
  Administered 2018-05-18: 20 mg via INTRAVENOUS
  Filled 2018-05-18: qty 2

## 2018-05-18 MED ORDER — MONTELUKAST SODIUM 10 MG PO TABS
ORAL_TABLET | ORAL | Status: AC
  Filled 2018-05-18: qty 1

## 2018-05-18 MED ORDER — ACETAMINOPHEN 325 MG PO TABS
650.0000 mg | ORAL_TABLET | Freq: Once | ORAL | Status: AC
  Administered 2018-05-18: 650 mg via ORAL

## 2018-05-18 MED ORDER — FAMOTIDINE IN NACL 20-0.9 MG/50ML-% IV SOLN
20.0000 mg | Freq: Once | INTRAVENOUS | Status: AC
  Administered 2018-05-18: 20 mg via INTRAVENOUS

## 2018-05-18 MED ORDER — DIPHENHYDRAMINE HCL 25 MG PO CAPS
50.0000 mg | ORAL_CAPSULE | Freq: Once | ORAL | Status: AC
  Administered 2018-05-18: 50 mg via ORAL

## 2018-05-18 MED ORDER — FAMOTIDINE IN NACL 20-0.9 MG/50ML-% IV SOLN
INTRAVENOUS | Status: AC
  Filled 2018-05-18: qty 50

## 2018-05-18 MED ORDER — SODIUM CHLORIDE 0.9 % IV SOLN: 16.0000 mg/kg | Freq: Once | INTRAVENOUS | Status: DC

## 2018-05-18 MED ORDER — HEPARIN SOD (PORK) LOCK FLUSH 100 UNIT/ML IV SOLN
500.0000 [IU] | Freq: Once | INTRAVENOUS | Status: AC | PRN
  Administered 2018-05-18: 500 [IU]
  Filled 2018-05-18: qty 5

## 2018-05-18 MED ORDER — SODIUM CHLORIDE 0.9% FLUSH
10.0000 mL | INTRAVENOUS | Status: DC | PRN
  Administered 2018-05-18: 10 mL
  Filled 2018-05-18: qty 10

## 2018-05-18 MED ORDER — SODIUM CHLORIDE 0.9 % IV SOLN
16.2000 mg/kg | Freq: Once | INTRAVENOUS | Status: AC
  Administered 2018-05-18: 1300 mg via INTRAVENOUS
  Filled 2018-05-18: qty 60

## 2018-05-18 NOTE — Progress Notes (Addendum)
  [facility] OFFICE PROGRESS NOTE   Diagnosis: Multiple myeloma  INTERVAL HISTORY:   [name] returns as scheduled.  She completed cycle 1 day 1 daratumumab/pomalidomide/dexamethasone 05/11/2018.  During the daratumumab infusion she noted a "tickling" in her throat and a cough.  This improved with Pepcid.  She had a second less severe episode during the infusion.  No nausea or vomiting.  No mouth sores.  No diarrhea.  No further cough.  No shortness of breath. No fever.  No rash.  She forgot to take the Singulair home dose this morning.  Objective:  Vital signs in last 24 hours:  Blood pressure (!) 110/59, pulse (!) 102, temperature 98.1 F (36.7 C), temperature source Oral, resp. rate 18, height 5' (1.524 m), weight 184 lb 6.4 oz (83.6 kg), SpO2 99 %.    HEENT: No thrush or ulcers. Resp: Lungs clear bilaterally. Cardio: Regular rate and rhythm. GI: Abdomen soft and nontender.  No hepatospleno megaly. Vascular: No leg edema.  The left lower leg is slightly larger than the right lower leg.  Calves soft and nontender. Port-A-Cath without erythema.  Lab Results:  Lab Results  Component Value Date   WBC 4.7 05/17/2018   HGB 9.1 (L) 05/17/2018   HCT 30.8 (L) 05/17/2018   MCV 84.2 05/17/2018   PLT 193 05/17/2018   NEUTROABS 3.5 05/17/2018    Imaging:  No results found.  Medications: I have reviewed the patient's current medications.  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 09/22/2017  57% plasma cells on initial bone marrow biopsy, kappa light chain restricted  Normal cytogenetics, FISH with -4, -14, and 13q-abnormalities  PET scan 10/16/2017-negative for bone lesions  Cycle 1 RVD 10/23/2017  Cycle 2 RVD 11/13/2017  Cycle 3 RVD 12/04/2017 (Revlimid dose reduced to 15 mg)  Cycle 4 RVD 12/29/2017  Cycle 5 RVD 01/26/2018  Cycle 1 carfilzomib/Cytoxan/Decadron 03/02/2018 (day 16 treatment held secondary to patient  preference)  Cycle2carfilzomib/Cytoxan/Decadron 03/30/2018  Improvement, but persistent elevation of the kappa/lambda ratio, transplant team recommended changing treatment to daratumumab/pomalidomide/Decadron  Cycle 1 daratumumab/pomalidomide/Decadron beginning 05/11/2018  2.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 2.4 cm solid right thyroid mass, follow-up ultrasound recommended  Biopsy 01/14/2018-atypia of undetermined significance or follicular lesion of undetermined significance  Affirmatesting- likely a benign lesion  6.Right rib fractures 12/21/2017  Disposition: [name] appears stable.  She completed cycle 1 day 1 daratumumab/pomalidomide/dexamethasone on 05/11/2018.  During the daratumumab infusion she developed a cough and "tickle" in her throat.  This improved with Pepcid.  Pepcid is being added to the premedication regimen beginning today.  She will also receive Singulair.  She will return in 1 week and 2 weeks for daratumumab.  We will see her in follow-up in 3 weeks with cycle 2-day 1 daratumumab.  She will contact the office in the interim with any problems.  Patient seen with Dr. Benay Spice.    [name] ANP/GNP-BC   05/18/2018  9:53 AM  This was a shared visit with [name].  We discussed the treatment plan with [name].  Pepcid will be added to the premedication regimen due to the possible mild allergic reaction with daratumumab last week.  Meghan Manson, MD

## 2018-05-22 ENCOUNTER — Other Ambulatory Visit: Payer: Self-pay | Admitting: Oncology

## 2018-05-24 ENCOUNTER — Other Ambulatory Visit: Payer: Self-pay | Admitting: Oncology

## 2018-05-24 DIAGNOSIS — C9000 Multiple myeloma not having achieved remission: Secondary | ICD-10-CM

## 2018-05-25 ENCOUNTER — Inpatient Hospital Stay: Payer: PPO

## 2018-05-25 VITALS — BP 136/79 | HR 83 | Temp 98.1°F | Resp 17

## 2018-05-25 DIAGNOSIS — C9000 Multiple myeloma not having achieved remission: Secondary | ICD-10-CM

## 2018-05-25 DIAGNOSIS — D649 Anemia, unspecified: Secondary | ICD-10-CM | POA: Diagnosis not present

## 2018-05-25 LAB — CBC WITH DIFFERENTIAL (CANCER CENTER ONLY)
Abs Immature Granulocytes: 0.02 10*3/uL (ref 0.00–0.07)
BASOS ABS: 0 10*3/uL (ref 0.0–0.1)
BASOS PCT: 0 %
Eosinophils Absolute: 0.1 10*3/uL (ref 0.0–0.5)
Eosinophils Relative: 3 %
HCT: 30 % — ABNORMAL LOW (ref 36.0–46.0)
Hemoglobin: 8.8 g/dL — ABNORMAL LOW (ref 12.0–15.0)
IMMATURE GRANULOCYTES: 0 %
LYMPHS ABS: 0.8 10*3/uL (ref 0.7–4.0)
Lymphocytes Relative: 18 %
MCH: 24.2 pg — ABNORMAL LOW (ref 26.0–34.0)
MCHC: 29.3 g/dL — ABNORMAL LOW (ref 30.0–36.0)
MCV: 82.6 fL (ref 80.0–100.0)
MONOS PCT: 13 %
Monocytes Absolute: 0.6 10*3/uL (ref 0.1–1.0)
NEUTROS ABS: 3 10*3/uL (ref 1.7–7.7)
NEUTROS PCT: 66 %
PLATELETS: 155 10*3/uL (ref 150–400)
RBC: 3.63 MIL/uL — ABNORMAL LOW (ref 3.87–5.11)
RDW: 17.4 % — AB (ref 11.5–15.5)
WBC Count: 4.6 10*3/uL (ref 4.0–10.5)
nRBC: 0 % (ref 0.0–0.2)

## 2018-05-25 LAB — CMP (CANCER CENTER ONLY)
ALBUMIN: 3.2 g/dL — AB (ref 3.5–5.0)
ALT: 35 U/L (ref 0–44)
AST: 33 U/L (ref 15–41)
Alkaline Phosphatase: 56 U/L (ref 38–126)
Anion gap: 8 (ref 5–15)
BUN: 12 mg/dL (ref 8–23)
CHLORIDE: 107 mmol/L (ref 98–111)
CO2: 24 mmol/L (ref 22–32)
CREATININE: 0.68 mg/dL (ref 0.44–1.00)
Calcium: 8.9 mg/dL (ref 8.9–10.3)
GFR, Est AFR Am: >60 mL/min (ref >60)
GLUCOSE: 110 mg/dL — AB (ref 70–99)
POTASSIUM: 4.6 mmol/L (ref 3.5–5.1)
Sodium: 139 mmol/L (ref 135–145)
Total Bilirubin: 0.4 mg/dL (ref 0.3–1.2)
Total Protein: 6 g/dL — ABNORMAL LOW (ref 6.5–8.1)

## 2018-05-25 MED ORDER — HEPARIN SOD (PORK) LOCK FLUSH 100 UNIT/ML IV SOLN
500.0000 [IU] | Freq: Once | INTRAVENOUS | Status: AC | PRN
  Administered 2018-05-25: 500 [IU]
  Filled 2018-05-25: qty 5

## 2018-05-25 MED ORDER — FAMOTIDINE IN NACL 20-0.9 MG/50ML-% IV SOLN
INTRAVENOUS | Status: AC
  Filled 2018-05-25: qty 50

## 2018-05-25 MED ORDER — DIPHENHYDRAMINE HCL 25 MG PO CAPS
ORAL_CAPSULE | ORAL | Status: AC
  Filled 2018-05-25: qty 2

## 2018-05-25 MED ORDER — FAMOTIDINE IN NACL 20-0.9 MG/50ML-% IV SOLN
20.0000 mg | Freq: Once | INTRAVENOUS | Status: AC
  Administered 2018-05-25: 20 mg via INTRAVENOUS

## 2018-05-25 MED ORDER — SODIUM CHLORIDE 0.9% FLUSH
10.0000 mL | Freq: Once | INTRAVENOUS | Status: AC | PRN
  Administered 2018-05-25: 10 mL
  Filled 2018-05-25: qty 10

## 2018-05-25 MED ORDER — SODIUM CHLORIDE 0.9% FLUSH
10.0000 mL | INTRAVENOUS | Status: DC | PRN
  Administered 2018-05-25: 10 mL
  Filled 2018-05-25: qty 10

## 2018-05-25 MED ORDER — SODIUM CHLORIDE 0.9 % IV SOLN
20.0000 mg | Freq: Once | INTRAVENOUS | Status: AC
  Administered 2018-05-25: 20 mg via INTRAVENOUS
  Filled 2018-05-25: qty 2

## 2018-05-25 MED ORDER — ACETAMINOPHEN 325 MG PO TABS
650.0000 mg | ORAL_TABLET | Freq: Once | ORAL | Status: AC
  Administered 2018-05-25: 650 mg via ORAL

## 2018-05-25 MED ORDER — DIPHENHYDRAMINE HCL 25 MG PO CAPS
50.0000 mg | ORAL_CAPSULE | Freq: Once | ORAL | Status: AC
  Administered 2018-05-25: 50 mg via ORAL

## 2018-05-25 MED ORDER — SODIUM CHLORIDE 0.9 % IV SOLN
16.2000 mg/kg | Freq: Once | INTRAVENOUS | Status: AC
  Administered 2018-05-25: 1300 mg via INTRAVENOUS
  Filled 2018-05-25: qty 60

## 2018-05-25 MED ORDER — SODIUM CHLORIDE 0.9 % IV SOLN
Freq: Once | INTRAVENOUS | Status: AC
  Administered 2018-05-25: 13:00:00 via INTRAVENOUS
  Filled 2018-05-25: qty 250

## 2018-05-25 MED ORDER — ACETAMINOPHEN 325 MG PO TABS
ORAL_TABLET | ORAL | Status: AC
  Filled 2018-05-25: qty 2

## 2018-05-25 NOTE — Patient Instructions (Signed)
[facility] Discharge Instructions for Patients Receiving Chemotherapy  Today you received the following chemotherapy agents: Daratumumab (Darzalex).  To help prevent nausea and vomiting after your treatment, we encourage you to take your nausea medication as directed.   If you develop nausea and vomiting that is not controlled by your nausea medication, call the clinic.   BELOW ARE SYMPTOMS THAT SHOULD BE REPORTED IMMEDIATELY:  *FEVER GREATER THAN 100.5 F  *CHILLS WITH OR WITHOUT FEVER  NAUSEA AND VOMITING THAT IS NOT CONTROLLED WITH YOUR NAUSEA MEDICATION  *UNUSUAL SHORTNESS OF BREATH  *UNUSUAL BRUISING OR BLEEDING  TENDERNESS IN MOUTH AND THROAT WITH OR WITHOUT PRESENCE OF ULCERS  *URINARY PROBLEMS  *BOWEL PROBLEMS  UNUSUAL RASH Items with * indicate a potential emergency and should be followed up as soon as possible.  Feel free to call the clinic should you have any questions or concerns. The clinic phone number is [phone].  Please show the [facility] at check-in to the Emergency Department and triage nurse.  Daratumumab injection What is this medicine? DARATUMUMAB (dar a toom ue mab) is a monoclonal antibody. It is used to treat multiple myeloma. This medicine may be used for other purposes; ask your health care provider or pharmacist if you have questions. COMMON BRAND NAME(S): DARZALEX What should I tell my health care provider before I take this medicine? They need to know if you have any of these conditions: -infection (especially a virus infection such as chickenpox, cold sores, or herpes) -lung or breathing disease -pregnant or trying to get pregnant -breast-feeding -an unusual or allergic reaction to daratumumab, other medicines, foods, dyes, or preservatives How should I use this medicine? This medicine is for infusion into a vein. It is given by a health care professional in a hospital or clinic setting. Talk to your  pediatrician regarding the use of this medicine in children. Special care may be needed. Overdosage: If you think you have taken too much of this medicine contact a poison control center or emergency room at once. NOTE: This medicine is only for you. Do not share this medicine with others. What if I miss a dose? Keep appointments for follow-up doses as directed. It is important not to miss your dose. Call your doctor or health care professional if you are unable to keep an appointment. What may interact with this medicine? Interactions have not been studied. Give your health care provider a list of all the medicines, herbs, non-prescription drugs, or dietary supplements you use. Also tell them if you smoke, drink alcohol, or use illegal drugs. Some items may interact with your medicine. This list may not describe all possible interactions. Give your health care provider a list of all the medicines, herbs, non-prescription drugs, or dietary supplements you use. Also tell them if you smoke, drink alcohol, or use illegal drugs. Some items may interact with your medicine. What should I watch for while using this medicine? This drug may make you feel generally unwell. Report any side effects. Continue your course of treatment even though you feel ill unless your doctor tells you to stop. This medicine can cause serious allergic reactions. To reduce your risk you may need to take medicine before treatment with this medicine. Take your medicine as directed. This medicine can affect the results of blood tests to match your blood type. These changes can last for up to 6 months after the final dose. Your healthcare provider will do blood tests to match your blood  type before you start treatment. Tell all of your healthcare providers that you are being treated with this medicine before receiving a blood transfusion. This medicine can affect the results of some tests used to determine treatment response; extra  tests may be needed to evaluate response. Do not become pregnant while taking this medicine or for 3 months after stopping it. Women should inform their doctor if they wish to become pregnant or think they might be pregnant. There is a potential for serious side effects to an unborn child. Talk to your health care professional or pharmacist for more information. What side effects may I notice from receiving this medicine? Side effects that you should report to your doctor or health care professional as soon as possible: -allergic reactions like skin rash, itching or hives, swelling of the face, lips, or tongue -breathing problems -chills -cough -dizziness -feeling faint or lightheaded -headache -low blood counts - this medicine may decrease the number of white blood cells, red blood cells and platelets. You may be at increased risk for infections and bleeding. -nausea, vomiting -shortness of breath -signs of decreased platelets or bleeding - bruising, pinpoint red spots on the skin, black, tarry stools, blood in the urine -signs of decreased red blood cells - unusually weak or tired, feeling faint or lightheaded, falls -signs of infection - fever or chills, cough, sore throat, pain or difficulty passing urine Side effects that usually do not require medical attention (report to your doctor or health care professional if they continue or are bothersome): -back pain -diarrhea -muscle cramps -pain, tingling, numbness in the hands or feet -swelling of the ankles, feet, hands -tiredness This list may not describe all possible side effects. Call your doctor for medical advice about side effects. You may report side effects to FDA at 1-800-FDA-1088. Where should I keep my medicine? Keep out of the reach of children. This drug is given in a hospital or clinic and will not be stored at home. NOTE: This sheet is a summary. It may not cover all possible information. If you have questions about this  medicine, talk to your doctor, pharmacist, or health care provider.  2018 Elsevier/Gold Standard (2015-07-19 10:38:11)

## 2018-05-26 ENCOUNTER — Other Ambulatory Visit: Payer: Self-pay | Admitting: *Deleted

## 2018-05-26 DIAGNOSIS — C9000 Multiple myeloma not having achieved remission: Secondary | ICD-10-CM

## 2018-05-26 MED ORDER — POMALIDOMIDE 3 MG PO CAPS: 3.0000 mg | ORAL_CAPSULE | Freq: Every day | ORAL | 0 refills | Status: DC

## 2018-05-28 ENCOUNTER — Encounter: Payer: Self-pay | Admitting: *Deleted

## 2018-05-28 NOTE — Progress Notes (Signed)
Received a fax refill request for Pomalyst from [facility] had been sent electronically on 11/27 with receipt confirmed. Called pharmacy and confirmed with representative that they do have the script and will reach out to patient to arrange delivery.

## 2018-05-30 ENCOUNTER — Other Ambulatory Visit: Payer: Self-pay | Admitting: Oncology

## 2018-06-01 ENCOUNTER — Other Ambulatory Visit: Payer: Self-pay

## 2018-06-01 ENCOUNTER — Telehealth: Payer: Self-pay | Admitting: *Deleted

## 2018-06-01 ENCOUNTER — Ambulatory Visit: Payer: Self-pay

## 2018-06-01 MED ORDER — GUAIFENESIN-CODEINE 100-10 MG/5ML PO SOLN: 5.0000 mL | Freq: Three times a day (TID) | ORAL | 0 refills | Status: DC | PRN

## 2018-06-01 NOTE — Progress Notes (Signed)
Nutrition  Nutrition appointment cancelled for today along with chemotherapy due to patient being sick.  Will plan on meeting with during infusion on 12/10.    Kaybree Williams B. Zenia Resides, [place], [place] Registered Dietitian [phone] (pager)

## 2018-06-01 NOTE — Telephone Encounter (Signed)
Notified patient that MD approved guaifenesin/codeine syrup and it was called to [facility]. Will skip chemo this week and cancel the labs on 12/6. F/U as scheduled on 06/07/18 for cycle 2, day 1. Instructed her to hold her decadron dose this week as well. Instructed her to call if condition worsens, she gets fever or short of breath. She understands and agrees.

## 2018-06-01 NOTE — Telephone Encounter (Signed)
Calling to report progression of persistent hacking cough. Unable to speak without triggering coughing spell. No sputum, no fever or s/s of infection. Does not want to come in today for her treatment. Asking Dr. Benay Spice to order a cough medication with codeine--send to [facility] please.

## 2018-06-04 ENCOUNTER — Other Ambulatory Visit: Payer: Self-pay

## 2018-06-07 ENCOUNTER — Inpatient Hospital Stay: Payer: PPO

## 2018-06-07 ENCOUNTER — Inpatient Hospital Stay: Payer: PPO | Attending: Oncology

## 2018-06-07 ENCOUNTER — Inpatient Hospital Stay (HOSPITAL_BASED_OUTPATIENT_CLINIC_OR_DEPARTMENT_OTHER): Payer: PPO | Admitting: Oncology

## 2018-06-07 ENCOUNTER — Other Ambulatory Visit: Payer: Self-pay | Admitting: Oncology

## 2018-06-07 VITALS — BP 130/71 | HR 90 | Temp 98.0°F | Resp 18 | Ht 60.0 in | Wt 177.9 lb

## 2018-06-07 DIAGNOSIS — Z5111 Encounter for antineoplastic chemotherapy: Secondary | ICD-10-CM | POA: Diagnosis not present

## 2018-06-07 DIAGNOSIS — Z853 Personal history of malignant neoplasm of breast: Secondary | ICD-10-CM

## 2018-06-07 DIAGNOSIS — F329 Major depressive disorder, single episode, unspecified: Secondary | ICD-10-CM | POA: Insufficient documentation

## 2018-06-07 DIAGNOSIS — Z79899 Other long term (current) drug therapy: Secondary | ICD-10-CM

## 2018-06-07 DIAGNOSIS — D6481 Anemia due to antineoplastic chemotherapy: Secondary | ICD-10-CM | POA: Insufficient documentation

## 2018-06-07 DIAGNOSIS — C9000 Multiple myeloma not having achieved remission: Secondary | ICD-10-CM

## 2018-06-07 LAB — CBC WITH DIFFERENTIAL (CANCER CENTER ONLY)
Abs Immature Granulocytes: 0.01 10*3/uL (ref 0.00–0.07)
Basophils Absolute: 0.1 10*3/uL (ref 0.0–0.1)
Basophils Relative: 2 %
EOS ABS: 0 10*3/uL (ref 0.0–0.5)
Eosinophils Relative: 1 %
HCT: 30.7 % — ABNORMAL LOW (ref 36.0–46.0)
Hemoglobin: 9.3 g/dL — ABNORMAL LOW (ref 12.0–15.0)
Immature Granulocytes: 0 %
Lymphocytes Relative: 29 %
Lymphs Abs: 1.1 10*3/uL (ref 0.7–4.0)
MCH: 25.1 pg — ABNORMAL LOW (ref 26.0–34.0)
MCHC: 30.3 g/dL (ref 30.0–36.0)
MCV: 83 fL (ref 80.0–100.0)
Monocytes Absolute: 0.7 10*3/uL (ref 0.1–1.0)
Monocytes Relative: 19 %
NRBC: 0 % (ref 0.0–0.2)
Neutro Abs: 1.8 10*3/uL (ref 1.7–7.7)
Neutrophils Relative %: 49 %
Platelet Count: 443 10*3/uL — ABNORMAL HIGH (ref 150–400)
RBC: 3.7 MIL/uL — ABNORMAL LOW (ref 3.87–5.11)
RDW: 19 % — ABNORMAL HIGH (ref 11.5–15.5)
WBC: 3.7 10*3/uL — AB (ref 4.0–10.5)

## 2018-06-07 MED ORDER — HEPARIN SOD (PORK) LOCK FLUSH 100 UNIT/ML IV SOLN
250.0000 [IU] | Freq: Once | INTRAVENOUS | Status: AC | PRN
  Administered 2018-06-07: 500 [IU]
  Filled 2018-06-07: qty 5

## 2018-06-07 MED ORDER — SODIUM CHLORIDE 0.9% FLUSH
10.0000 mL | Freq: Once | INTRAVENOUS | Status: AC | PRN
  Administered 2018-06-07: 10 mL
  Filled 2018-06-07: qty 10

## 2018-06-07 NOTE — Patient Instructions (Signed)
Implanted Port Home Guide An implanted port is a type of central line that is placed under the skin. Central lines are used to provide IV access when treatment or nutrition needs to be given through a person's veins. Implanted ports are used for long-term IV access. An implanted port may be placed because:  You need IV medicine that would be irritating to the small veins in your hands or arms.  You need long-term IV medicines, such as antibiotics.  You need IV nutrition for a long period.  You need frequent blood draws for lab tests.  You need dialysis.  Implanted ports are usually placed in the chest area, but they can also be placed in the upper arm, the abdomen, or the leg. An implanted port has two main parts:  Reservoir. The reservoir is round and will appear as a small, raised area under your skin. The reservoir is the part where a needle is inserted to give medicines or draw blood.  Catheter. The catheter is a thin, flexible tube that extends from the reservoir. The catheter is placed into a large vein. Medicine that is inserted into the reservoir goes into the catheter and then into the vein.  How will I care for my incision site? Do not get the incision site wet. Bathe or shower as directed by your health care provider. How is my port accessed? Special steps must be taken to access the port:  Before the port is accessed, a numbing cream can be placed on the skin. This helps numb the skin over the port site.  Your health care provider uses a sterile technique to access the port. ? Your health care provider must put on a mask and sterile gloves. ? The skin over your port is cleaned carefully with an antiseptic and allowed to dry. ? The port is gently pinched between sterile gloves, and a needle is inserted into the port.  Only "non-coring" port needles should be used to access the port. Once the port is accessed, a blood return should be checked. This helps ensure that the port  is in the vein and is not clogged.  If your port needs to remain accessed for a constant infusion, a clear (transparent) bandage will be placed over the needle site. The bandage and needle will need to be changed every week, or as directed by your health care provider.  Keep the bandage covering the needle clean and dry. Do not get it wet. Follow your health care provider's instructions on how to take a shower or bath while the port is accessed.  If your port does not need to stay accessed, no bandage is needed over the port.  What is flushing? Flushing helps keep the port from getting clogged. Follow your health care provider's instructions on how and when to flush the port. Ports are usually flushed with saline solution or a medicine called heparin. The need for flushing will depend on how the port is used.  If the port is used for intermittent medicines or blood draws, the port will need to be flushed: ? After medicines have been given. ? After blood has been drawn. ? As part of routine maintenance.  If a constant infusion is running, the port may not need to be flushed.  How long will my port stay implanted? The port can stay in for as long as your health care provider thinks it is needed. When it is time for the port to come out, surgery will be   done to remove it. The procedure is similar to the one performed when the port was put in. When should I seek immediate medical care? When you have an implanted port, you should seek immediate medical care if:  You notice a bad smell coming from the incision site.  You have swelling, redness, or drainage at the incision site.  You have more swelling or pain at the port site or the surrounding area.  You have a fever that is not controlled with medicine.  This information is not intended to replace advice given to you by your health care provider. Make sure you discuss any questions you have with your health care provider. Document  Released: 06/16/2005 Document Revised: 11/22/2015 Document Reviewed: 02/21/2013 Elsevier Interactive Patient Education  2017 Elsevier Inc.  

## 2018-06-07 NOTE — Progress Notes (Signed)
[facility] OFFICE PROGRESS NOTE   Diagnosis: Multiple myeloma  INTERVAL HISTORY:   [name] returns for a scheduled visit.  She was last treated with daratumumab on 05/25/2018.  No symptom of an allergic reaction.  She held treatment last week secondary to symptoms of an upper respiratory infection.  She reports congestion and a cough for the past month.  This has improved.  No fever. She is scheduled to resume pomalidomide tomorrow.  Objective:  Vital signs in last 24 hours:  Blood pressure 130/71, pulse 90, temperature 98 F (36.7 C), temperature source Oral, resp. rate 18, height 5' (1.524 m), weight 177 lb 14.4 oz (80.7 kg), SpO2 100 %.    HEENT: No thrush or ulcers Resp: Lungs clear bilaterally, no respiratory distress Cardio: Regular rate and rhythm GI: No hepatosplenomegaly, nontender Vascular: The left lower leg is larger than the right side, no erythema   Portacath/PICC-without erythema  Lab Results:  Lab Results  Component Value Date   WBC 3.7 (L) 06/07/2018   HGB 9.3 (L) 06/07/2018   HCT 30.7 (L) 06/07/2018   MCV 83.0 06/07/2018   PLT 443 (H) 06/07/2018   NEUTROABS 1.8 06/07/2018    CMP  Lab Results  Component Value Date   NA 139 05/25/2018   K 4.6 05/25/2018   CL 107 05/25/2018   CO2 24 05/25/2018   GLUCOSE 110 (H) 05/25/2018   BUN 12 05/25/2018   CREATININE 0.68 05/25/2018   CALCIUM 8.9 05/25/2018   PROT 6.0 (L) 05/25/2018   ALBUMIN 3.2 (L) 05/25/2018   AST 33 05/25/2018   ALT 35 05/25/2018   ALKPHOS 56 05/25/2018   BILITOT 0.4 05/25/2018   GFRNONAA >60 05/25/2018   GFRAA >60 05/25/2018     Medications: I have reviewed the patient's current medications.   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 09/22/2017  57% plasma cells on initial bone marrow biopsy, kappa light chain restricted  Normal cytogenetics, FISH with -4, -14, and 13q-abnormalities  PET scan 10/16/2017-negative  for bone lesions  Cycle 1 RVD 10/23/2017  Cycle 2 RVD 11/13/2017  Cycle 3 RVD 12/04/2017 (Revlimid dose reduced to 15 mg)  Cycle 4 RVD 12/29/2017  Cycle 5 RVD 01/26/2018  Cycle 1 carfilzomib/Cytoxan/Decadron 03/02/2018 (day 16 treatment held secondary to patient preference)  Cycle2carfilzomib/Cytoxan/Decadron 03/30/2018  Improvement, but persistent elevation of the kappa/lambda ratio, transplant team recommended changing treatment to daratumumab/pomalidomide/Decadron  Cycle 1 daratumumab/pomalidomide/Decadron beginning 05/11/2018 (day 22 daratumumab held secondary to an upper respiratory infection)  Cycle to daratumumab/pomalidomide/Decadron beginning 06/08/2018  2.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 2.4 cm solid right thyroid mass, follow-up ultrasound recommended  Biopsy 01/14/2018-atypia of undetermined significance or follicular lesion of undetermined significance  Affirmatesting- likely a benign lesion  6.Right rib fractures 12/21/2017   Disposition: Ms. Porcelli appears stable.  I suspect the cough over the past several weeks is related to a viral upper respiratory infection as opposed to toxicity from the systemic therapy.  She will begin another cycle of daratumumab/pomalidomide/Decadron tomorrow.  She would like to hold the day 22 therapy with this cycle. She is scheduled follow-up at [facility] in early January.  We will follow-up on the myeloma panel from today.  She will be scheduled for an office visit on 06/22/2018 and 07/06/2018.  Betsy Coder, MD  06/07/2018  2:08 PM

## 2018-06-08 ENCOUNTER — Inpatient Hospital Stay: Payer: PPO

## 2018-06-08 ENCOUNTER — Other Ambulatory Visit: Payer: Self-pay

## 2018-06-08 VITALS — BP 122/68 | HR 90 | Temp 99.0°F | Resp 18

## 2018-06-08 DIAGNOSIS — C9000 Multiple myeloma not having achieved remission: Secondary | ICD-10-CM

## 2018-06-08 DIAGNOSIS — Z5111 Encounter for antineoplastic chemotherapy: Secondary | ICD-10-CM | POA: Diagnosis not present

## 2018-06-08 MED ORDER — FAMOTIDINE IN NACL 20-0.9 MG/50ML-% IV SOLN
20.0000 mg | Freq: Once | INTRAVENOUS | Status: AC
  Administered 2018-06-08: 20 mg via INTRAVENOUS

## 2018-06-08 MED ORDER — SODIUM CHLORIDE 0.9 % IV SOLN
16.3000 mg/kg | Freq: Once | INTRAVENOUS | Status: AC
  Administered 2018-06-08: 1300 mg via INTRAVENOUS
  Filled 2018-06-08: qty 60

## 2018-06-08 MED ORDER — ACETAMINOPHEN 325 MG PO TABS
ORAL_TABLET | ORAL | Status: AC
  Filled 2018-06-08: qty 2

## 2018-06-08 MED ORDER — HEPARIN SOD (PORK) LOCK FLUSH 100 UNIT/ML IV SOLN
500.0000 [IU] | Freq: Once | INTRAVENOUS | Status: AC | PRN
  Administered 2018-06-08: 500 [IU]
  Filled 2018-06-08: qty 5

## 2018-06-08 MED ORDER — SODIUM CHLORIDE 0.9 % IV SOLN
Freq: Once | INTRAVENOUS | Status: AC
  Administered 2018-06-08: 12:00:00 via INTRAVENOUS
  Filled 2018-06-08: qty 250

## 2018-06-08 MED ORDER — SODIUM CHLORIDE 0.9% FLUSH
10.0000 mL | INTRAVENOUS | Status: DC | PRN
  Administered 2018-06-08: 10 mL
  Filled 2018-06-08: qty 10

## 2018-06-08 MED ORDER — DIPHENHYDRAMINE HCL 25 MG PO CAPS
ORAL_CAPSULE | ORAL | Status: AC
  Filled 2018-06-08: qty 2

## 2018-06-08 MED ORDER — DIPHENHYDRAMINE HCL 25 MG PO CAPS
50.0000 mg | ORAL_CAPSULE | Freq: Once | ORAL | Status: AC
  Administered 2018-06-08: 50 mg via ORAL

## 2018-06-08 MED ORDER — FAMOTIDINE IN NACL 20-0.9 MG/50ML-% IV SOLN
INTRAVENOUS | Status: AC
  Filled 2018-06-08: qty 50

## 2018-06-08 MED ORDER — SODIUM CHLORIDE 0.9 % IV SOLN: 16.0000 mg/kg | Freq: Once | INTRAVENOUS | Status: DC

## 2018-06-08 MED ORDER — ACETAMINOPHEN 325 MG PO TABS
650.0000 mg | ORAL_TABLET | Freq: Once | ORAL | Status: AC
  Administered 2018-06-08: 650 mg via ORAL

## 2018-06-08 MED ORDER — ZOLEDRONIC ACID 4 MG/100ML IV SOLN
4.0000 mg | Freq: Once | INTRAVENOUS | Status: AC
  Administered 2018-06-08: 4 mg via INTRAVENOUS
  Filled 2018-06-08: qty 100

## 2018-06-08 MED ORDER — SODIUM CHLORIDE 0.9 % IV SOLN
20.0000 mg | Freq: Once | INTRAVENOUS | Status: AC
  Administered 2018-06-08: 20 mg via INTRAVENOUS
  Filled 2018-06-08: qty 2

## 2018-06-08 NOTE — Patient Instructions (Signed)
Please make sure you're taking ~1,000 mg of Calcium supplementation (ideally with a Vitamin D combination). If your multivitamin already has Calcium and Vitamin D in it, just supplement with Tums to equal 1,000 mg. Call Dr. Gearldine Shown desk nurse if you have questions/concerns.   [facility] Discharge Instructions for Patients Receiving Chemotherapy  Today you received the following chemotherapy agents: Daratumumab (Darzalex)  To help prevent nausea and vomiting after your treatment, we encourage you to take your nausea medication as directed.   If you develop nausea and vomiting that is not controlled by your nausea medication, call the clinic.   BELOW ARE SYMPTOMS THAT SHOULD BE REPORTED IMMEDIATELY:  *FEVER GREATER THAN 100.5 F  *CHILLS WITH OR WITHOUT FEVER  NAUSEA AND VOMITING THAT IS NOT CONTROLLED WITH YOUR NAUSEA MEDICATION  *UNUSUAL SHORTNESS OF BREATH  *UNUSUAL BRUISING OR BLEEDING  TENDERNESS IN MOUTH AND THROAT WITH OR WITHOUT PRESENCE OF ULCERS  *URINARY PROBLEMS  *BOWEL PROBLEMS  UNUSUAL RASH Items with * indicate a potential emergency and should be followed up as soon as possible.  Feel free to call the clinic should you have any questions or concerns. The clinic phone number is [phone].  Please show the [facility] at check-in to the Emergency Department and triage nurse.  Zoledronic Acid injection (Hypercalcemia, Oncology) What is this medicine? ZOLEDRONIC ACID (ZOE le dron ik AS id) lowers the amount of calcium loss from bone. It is used to treat too much calcium in your blood from cancer. It is also used to prevent complications of cancer that has spread to the bone. This medicine may be used for other purposes; ask your health care provider or pharmacist if you have questions. COMMON BRAND NAME(S): Zometa What should I tell my health care provider before I take this medicine? They need to know if you have any of these  conditions: -aspirin-sensitive asthma -cancer, especially if you are receiving medicines used to treat cancer -dental disease or wear dentures -infection -kidney disease -receiving corticosteroids like dexamethasone or prednisone -an unusual or allergic reaction to zoledronic acid, other medicines, foods, dyes, or preservatives -pregnant or trying to get pregnant -breast-feeding How should I use this medicine? This medicine is for infusion into a vein. It is given by a health care professional in a hospital or clinic setting. Talk to your pediatrician regarding the use of this medicine in children. Special care may be needed. Overdosage: If you think you have taken too much of this medicine contact a poison control center or emergency room at once. NOTE: This medicine is only for you. Do not share this medicine with others. What if I miss a dose? It is important not to miss your dose. Call your doctor or health care professional if you are unable to keep an appointment. What may interact with this medicine? -certain antibiotics given by injection -NSAIDs, medicines for pain and inflammation, like ibuprofen or naproxen -some diuretics like bumetanide, furosemide -teriparatide -thalidomide This list may not describe all possible interactions. Give your health care provider a list of all the medicines, herbs, non-prescription drugs, or dietary supplements you use. Also tell them if you smoke, drink alcohol, or use illegal drugs. Some items may interact with your medicine. What should I watch for while using this medicine? Visit your doctor or health care professional for regular checkups. It may be some time before you see the benefit from this medicine. Do not stop taking your medicine unless your doctor tells you  to. Your doctor may order blood tests or other tests to see how you are doing. Women should inform their doctor if they wish to become pregnant or think they might be pregnant.  There is a potential for serious side effects to an unborn child. Talk to your health care professional or pharmacist for more information. You should make sure that you get enough calcium and vitamin D while you are taking this medicine. Discuss the foods you eat and the vitamins you take with your health care professional. Some people who take this medicine have severe bone, joint, and/or muscle pain. This medicine may also increase your risk for jaw problems or a broken thigh bone. Tell your doctor right away if you have severe pain in your jaw, bones, joints, or muscles. Tell your doctor if you have any pain that does not go away or that gets worse. Tell your dentist and dental surgeon that you are taking this medicine. You should not have major dental surgery while on this medicine. See your dentist to have a dental exam and fix any dental problems before starting this medicine. Take good care of your teeth while on this medicine. Make sure you see your dentist for regular follow-up appointments. What side effects may I notice from receiving this medicine? Side effects that you should report to your doctor or health care professional as soon as possible: -allergic reactions like skin rash, itching or hives, swelling of the face, lips, or tongue -anxiety, confusion, or depression -breathing problems -changes in vision -eye pain -feeling faint or lightheaded, falls -jaw pain, especially after dental work -mouth sores -muscle cramps, stiffness, or weakness -redness, blistering, peeling or loosening of the skin, including inside the mouth -trouble passing urine or change in the amount of urine Side effects that usually do not require medical attention (report to your doctor or health care professional if they continue or are bothersome): -bone, joint, or muscle pain -constipation -diarrhea -fever -hair loss -irritation at site where injected -loss of appetite -nausea, vomiting -stomach  upset -trouble sleeping -trouble swallowing -weak or tired This list may not describe all possible side effects. Call your doctor for medical advice about side effects. You may report side effects to FDA at 1-800-FDA-1088. Where should I keep my medicine? This drug is given in a hospital or clinic and will not be stored at home. NOTE: This sheet is a summary. It may not cover all possible information. If you have questions about this medicine, talk to your doctor, pharmacist, or health care provider.  2018 Elsevier/Gold Standard (2013-11-12 14:19:39)

## 2018-06-08 NOTE — Progress Notes (Signed)
Nutrition Follow-up:  Patient with multiple myeloma followed by Dr. Benay Spice.  Patient receiving chemotherapy.  Met with patient during infusion.  Patient reports that appetite is so-so.  Reports that she is drinking ensure max protein mostly 1 time per day sometimes 2.  Continues to try and eat good sources of protein (chicken, hamburger, diary products (lactaid milk, cottage cheese), eggs.     Medications: reviewed  Labs: reviewed  Anthropometrics:   Weight 177 lb today stable from weight in October of 176 lb.  Noted weight 184 lb on 11/19.     NUTRITION DIAGNOSIS: Inadequate oral intake improved   INTERVENTION:  Encouraged good sources of protein at every meal. Encouraged patient to continue oral nutrition supplements for weight maintenance.       MONITORING, EVALUATION, GOAL: Patient will consume adequate calories and protein to maintain weight during treatment.    NEXT VISIT: to be determined with treatment  [place] Yarbro B. Zenia Resides, [place], [place] Registered Dietitian [phone] (pager)

## 2018-06-08 NOTE — Progress Notes (Signed)
Per Dr. Benay Spice, ok to add in Singulair as premed. He would also like to transition pt to rapid Daratumumab. Information given to Egbert Garibaldi, RN and she stated pt already took Singulair at home today. Kennith Center, Pharm.D., CPP 06/08/2018@12 :27 PM

## 2018-06-09 LAB — MULTIPLE MYELOMA PANEL, SERUM
ALPHA2 GLOB SERPL ELPH-MCNC: 0.8 g/dL (ref 0.4–1.0)
Albumin SerPl Elph-Mcnc: 3.3 g/dL (ref 2.9–4.4)
Albumin/Glob SerPl: 1.5 (ref 0.7–1.7)
Alpha 1: 0.3 g/dL (ref 0.0–0.4)
B-Globulin SerPl Elph-Mcnc: 0.9 g/dL (ref 0.7–1.3)
Gamma Glob SerPl Elph-Mcnc: 0.3 g/dL — ABNORMAL LOW (ref 0.4–1.8)
Globulin, Total: 2.3 g/dL (ref 2.2–3.9)
IgA: 69 mg/dL — ABNORMAL LOW (ref 87–352)
IgG (Immunoglobin G), Serum: 304 mg/dL — ABNORMAL LOW (ref 700–1600)
IgM (Immunoglobulin M), Srm: 28 mg/dL (ref 26–217)
M Protein SerPl Elph-Mcnc: 0.1 g/dL — ABNORMAL HIGH
Total Protein ELP: 5.6 g/dL — ABNORMAL LOW (ref 6.0–8.5)

## 2018-06-11 ENCOUNTER — Other Ambulatory Visit: Payer: Self-pay | Admitting: *Deleted

## 2018-06-11 ENCOUNTER — Telehealth: Payer: Self-pay | Admitting: *Deleted

## 2018-06-11 DIAGNOSIS — C9000 Multiple myeloma not having achieved remission: Secondary | ICD-10-CM

## 2018-06-11 NOTE — Telephone Encounter (Signed)
TCT patient regarding recent lab results. Reviewed them with patient though advised that not all results are available yet. Pt voiced understanding. No further questions or concerns.

## 2018-06-13 ENCOUNTER — Other Ambulatory Visit: Payer: Self-pay | Admitting: Oncology

## 2018-06-14 DIAGNOSIS — M4807 Spinal stenosis, lumbosacral region: Secondary | ICD-10-CM | POA: Diagnosis not present

## 2018-06-14 DIAGNOSIS — G894 Chronic pain syndrome: Secondary | ICD-10-CM | POA: Diagnosis not present

## 2018-06-14 DIAGNOSIS — F329 Major depressive disorder, single episode, unspecified: Secondary | ICD-10-CM | POA: Diagnosis not present

## 2018-06-14 DIAGNOSIS — M47817 Spondylosis without myelopathy or radiculopathy, lumbosacral region: Secondary | ICD-10-CM | POA: Diagnosis not present

## 2018-06-15 ENCOUNTER — Inpatient Hospital Stay: Payer: PPO

## 2018-06-15 VITALS — BP 133/78 | HR 91 | Temp 98.2°F | Resp 18

## 2018-06-15 DIAGNOSIS — C9000 Multiple myeloma not having achieved remission: Secondary | ICD-10-CM

## 2018-06-15 DIAGNOSIS — Z5111 Encounter for antineoplastic chemotherapy: Secondary | ICD-10-CM | POA: Diagnosis not present

## 2018-06-15 LAB — CBC WITH DIFFERENTIAL (CANCER CENTER ONLY)
Abs Immature Granulocytes: 0.03 10*3/uL (ref 0.00–0.07)
Basophils Absolute: 0.1 10*3/uL (ref 0.0–0.1)
Basophils Relative: 1 %
Eosinophils Absolute: 0 10*3/uL (ref 0.0–0.5)
Eosinophils Relative: 0 %
HCT: 31.2 % — ABNORMAL LOW (ref 36.0–46.0)
Hemoglobin: 8.9 g/dL — ABNORMAL LOW (ref 12.0–15.0)
Immature Granulocytes: 1 %
LYMPHS PCT: 5 %
Lymphs Abs: 0.3 10*3/uL — ABNORMAL LOW (ref 0.7–4.0)
MCH: 23.5 pg — ABNORMAL LOW (ref 26.0–34.0)
MCHC: 28.5 g/dL — ABNORMAL LOW (ref 30.0–36.0)
MCV: 82.5 fL (ref 80.0–100.0)
Monocytes Absolute: 0.1 10*3/uL (ref 0.1–1.0)
Monocytes Relative: 1 %
Neutro Abs: 5.5 10*3/uL (ref 1.7–7.7)
Neutrophils Relative %: 92 %
Platelet Count: 338 10*3/uL (ref 150–400)
RBC: 3.78 MIL/uL — ABNORMAL LOW (ref 3.87–5.11)
RDW: 18.7 % — ABNORMAL HIGH (ref 11.5–15.5)
WBC: 6 10*3/uL (ref 4.0–10.5)
nRBC: 0 % (ref 0.0–0.2)

## 2018-06-15 MED ORDER — MONTELUKAST SODIUM 10 MG PO TABS
ORAL_TABLET | ORAL | Status: AC
  Filled 2018-06-15: qty 1

## 2018-06-15 MED ORDER — SODIUM CHLORIDE 0.9 % IV SOLN
20.0000 mg | Freq: Once | INTRAVENOUS | Status: AC
  Administered 2018-06-15: 20 mg via INTRAVENOUS
  Filled 2018-06-15: qty 2

## 2018-06-15 MED ORDER — HEPARIN SOD (PORK) LOCK FLUSH 100 UNIT/ML IV SOLN
500.0000 [IU] | Freq: Once | INTRAVENOUS | Status: AC | PRN
  Administered 2018-06-15: 500 [IU]
  Filled 2018-06-15: qty 5

## 2018-06-15 MED ORDER — SODIUM CHLORIDE 0.9% FLUSH
10.0000 mL | INTRAVENOUS | Status: DC | PRN
  Administered 2018-06-15: 10 mL
  Filled 2018-06-15: qty 10

## 2018-06-15 MED ORDER — ACETAMINOPHEN 325 MG PO TABS
650.0000 mg | ORAL_TABLET | Freq: Once | ORAL | Status: AC
  Administered 2018-06-15: 650 mg via ORAL

## 2018-06-15 MED ORDER — SODIUM CHLORIDE 0.9% FLUSH
10.0000 mL | Freq: Once | INTRAVENOUS | Status: AC | PRN
  Administered 2018-06-15: 10 mL
  Filled 2018-06-15: qty 10

## 2018-06-15 MED ORDER — DIPHENHYDRAMINE HCL 25 MG PO CAPS
ORAL_CAPSULE | ORAL | Status: AC
  Filled 2018-06-15: qty 2

## 2018-06-15 MED ORDER — MONTELUKAST SODIUM 10 MG PO TABS
10.0000 mg | ORAL_TABLET | Freq: Once | ORAL | Status: AC
  Administered 2018-06-15: 10 mg via ORAL

## 2018-06-15 MED ORDER — DIPHENHYDRAMINE HCL 25 MG PO CAPS
50.0000 mg | ORAL_CAPSULE | Freq: Once | ORAL | Status: AC
  Administered 2018-06-15: 50 mg via ORAL

## 2018-06-15 MED ORDER — FAMOTIDINE IN NACL 20-0.9 MG/50ML-% IV SOLN
20.0000 mg | Freq: Once | INTRAVENOUS | Status: AC
  Administered 2018-06-15: 20 mg via INTRAVENOUS

## 2018-06-15 MED ORDER — FAMOTIDINE IN NACL 20-0.9 MG/50ML-% IV SOLN
INTRAVENOUS | Status: AC
  Filled 2018-06-15: qty 50

## 2018-06-15 MED ORDER — SODIUM CHLORIDE 0.9 % IV SOLN
16.2000 mg/kg | Freq: Once | INTRAVENOUS | Status: AC
  Administered 2018-06-15: 1300 mg via INTRAVENOUS
  Filled 2018-06-15: qty 60

## 2018-06-15 MED ORDER — SODIUM CHLORIDE 0.9 % IV SOLN
Freq: Once | INTRAVENOUS | Status: AC
  Administered 2018-06-15: 13:00:00 via INTRAVENOUS
  Filled 2018-06-15: qty 250

## 2018-06-15 MED ORDER — ACETAMINOPHEN 325 MG PO TABS
ORAL_TABLET | ORAL | Status: AC
  Filled 2018-06-15: qty 2

## 2018-06-15 NOTE — Patient Instructions (Signed)
[facility] Discharge Instructions for Patients Receiving Chemotherapy  Today you received the following chemotherapy agents Daratumumab(Darzalex)  To help prevent nausea and vomiting after your treatment, we encourage you to take your nausea medication as directed.    If you develop nausea and vomiting that is not controlled by your nausea medication, call the clinic.   BELOW ARE SYMPTOMS THAT SHOULD BE REPORTED IMMEDIATELY:  *FEVER GREATER THAN 100.5 F  *CHILLS WITH OR WITHOUT FEVER  NAUSEA AND VOMITING THAT IS NOT CONTROLLED WITH YOUR NAUSEA MEDICATION  *UNUSUAL SHORTNESS OF BREATH  *UNUSUAL BRUISING OR BLEEDING  TENDERNESS IN MOUTH AND THROAT WITH OR WITHOUT PRESENCE OF ULCERS  *URINARY PROBLEMS  *BOWEL PROBLEMS  UNUSUAL RASH Items with * indicate a potential emergency and should be followed up as soon as possible.  Feel free to call the clinic should you have any questions or concerns. The clinic phone number is [phone].  Please show the CHEMO ALERT CARD at check-in to the Emergency Department and triage nurse.   

## 2018-06-16 ENCOUNTER — Telehealth: Payer: Self-pay | Admitting: *Deleted

## 2018-06-16 NOTE — Telephone Encounter (Signed)
Patient called asking for her kappa/lamda ratio results. Called back and informed her that it was not included on the myeloma panel from 06/07/18. The kappa/lamda was added on 06/11/18. Lab reports we should have result by 12/20 or 12/23.

## 2018-06-20 ENCOUNTER — Other Ambulatory Visit: Payer: Self-pay | Admitting: Oncology

## 2018-06-21 ENCOUNTER — Telehealth: Payer: Self-pay

## 2018-06-21 NOTE — Telephone Encounter (Signed)
Per 12/9 los office visit added at 9:30

## 2018-06-22 ENCOUNTER — Inpatient Hospital Stay: Payer: PPO

## 2018-06-22 ENCOUNTER — Inpatient Hospital Stay (HOSPITAL_BASED_OUTPATIENT_CLINIC_OR_DEPARTMENT_OTHER): Payer: PPO | Admitting: Oncology

## 2018-06-22 VITALS — BP 126/71 | HR 92 | Temp 98.1°F | Resp 18

## 2018-06-22 VITALS — BP 145/69 | HR 104 | Temp 98.6°F | Resp 18 | Ht 61.0 in | Wt 180.4 lb

## 2018-06-22 DIAGNOSIS — Z79899 Other long term (current) drug therapy: Secondary | ICD-10-CM

## 2018-06-22 DIAGNOSIS — C9000 Multiple myeloma not having achieved remission: Secondary | ICD-10-CM

## 2018-06-22 DIAGNOSIS — D6481 Anemia due to antineoplastic chemotherapy: Secondary | ICD-10-CM

## 2018-06-22 DIAGNOSIS — Z853 Personal history of malignant neoplasm of breast: Secondary | ICD-10-CM

## 2018-06-22 DIAGNOSIS — F329 Major depressive disorder, single episode, unspecified: Secondary | ICD-10-CM | POA: Diagnosis not present

## 2018-06-22 DIAGNOSIS — Z5111 Encounter for antineoplastic chemotherapy: Secondary | ICD-10-CM | POA: Diagnosis not present

## 2018-06-22 LAB — CBC WITH DIFFERENTIAL (CANCER CENTER ONLY)
Abs Immature Granulocytes: 0.03 10*3/uL (ref 0.00–0.07)
BASOS ABS: 0 10*3/uL (ref 0.0–0.1)
Basophils Relative: 1 %
Eosinophils Absolute: 0.1 10*3/uL (ref 0.0–0.5)
Eosinophils Relative: 3 %
HCT: 30.7 % — ABNORMAL LOW (ref 36.0–46.0)
Hemoglobin: 8.7 g/dL — ABNORMAL LOW (ref 12.0–15.0)
Immature Granulocytes: 1 %
Lymphocytes Relative: 12 %
Lymphs Abs: 0.6 10*3/uL — ABNORMAL LOW (ref 0.7–4.0)
MCH: 22.8 pg — ABNORMAL LOW (ref 26.0–34.0)
MCHC: 28.3 g/dL — ABNORMAL LOW (ref 30.0–36.0)
MCV: 80.6 fL (ref 80.0–100.0)
Monocytes Absolute: 0.6 10*3/uL (ref 0.1–1.0)
Monocytes Relative: 12 %
NEUTROS PCT: 71 %
NRBC: 0 % (ref 0.0–0.2)
Neutro Abs: 3.8 10*3/uL (ref 1.7–7.7)
Platelet Count: 295 10*3/uL (ref 150–400)
RBC: 3.81 MIL/uL — ABNORMAL LOW (ref 3.87–5.11)
RDW: 18.6 % — ABNORMAL HIGH (ref 11.5–15.5)
WBC Count: 5.2 10*3/uL (ref 4.0–10.5)

## 2018-06-22 MED ORDER — SODIUM CHLORIDE 0.9 % IV SOLN
20.0000 mg | Freq: Once | INTRAVENOUS | Status: AC
  Administered 2018-06-22: 20 mg via INTRAVENOUS
  Filled 2018-06-22: qty 2

## 2018-06-22 MED ORDER — ACETAMINOPHEN 325 MG PO TABS
650.0000 mg | ORAL_TABLET | Freq: Once | ORAL | Status: AC
  Administered 2018-06-22: 650 mg via ORAL

## 2018-06-22 MED ORDER — FAMOTIDINE IN NACL 20-0.9 MG/50ML-% IV SOLN
INTRAVENOUS | Status: AC
  Filled 2018-06-22: qty 50

## 2018-06-22 MED ORDER — HEPARIN SOD (PORK) LOCK FLUSH 100 UNIT/ML IV SOLN
500.0000 [IU] | Freq: Once | INTRAVENOUS | Status: AC | PRN
  Administered 2018-06-22: 500 [IU]
  Filled 2018-06-22: qty 5

## 2018-06-22 MED ORDER — MONTELUKAST SODIUM 10 MG PO TABS: 10.0000 mg | ORAL_TABLET | Freq: Once | ORAL | Status: DC

## 2018-06-22 MED ORDER — DIPHENHYDRAMINE HCL 25 MG PO CAPS
50.0000 mg | ORAL_CAPSULE | Freq: Once | ORAL | Status: AC
  Administered 2018-06-22: 50 mg via ORAL

## 2018-06-22 MED ORDER — ACETAMINOPHEN 325 MG PO TABS
ORAL_TABLET | ORAL | Status: AC
  Filled 2018-06-22: qty 2

## 2018-06-22 MED ORDER — SODIUM CHLORIDE 0.9 % IV SOLN
Freq: Once | INTRAVENOUS | Status: AC
  Administered 2018-06-22: 09:00:00 via INTRAVENOUS
  Filled 2018-06-22: qty 250

## 2018-06-22 MED ORDER — SODIUM CHLORIDE 0.9% FLUSH
10.0000 mL | INTRAVENOUS | Status: DC | PRN
  Administered 2018-06-22: 10 mL
  Filled 2018-06-22: qty 10

## 2018-06-22 MED ORDER — SODIUM CHLORIDE 0.9 % IV SOLN
16.2000 mg/kg | Freq: Once | INTRAVENOUS | Status: AC
  Administered 2018-06-22: 1300 mg via INTRAVENOUS
  Filled 2018-06-22: qty 60

## 2018-06-22 MED ORDER — DIPHENHYDRAMINE HCL 25 MG PO CAPS
ORAL_CAPSULE | ORAL | Status: AC
  Filled 2018-06-22: qty 2

## 2018-06-22 MED ORDER — SODIUM CHLORIDE 0.9% FLUSH
10.0000 mL | Freq: Once | INTRAVENOUS | Status: AC | PRN
  Administered 2018-06-22: 10 mL
  Filled 2018-06-22: qty 10

## 2018-06-22 MED ORDER — FAMOTIDINE IN NACL 20-0.9 MG/50ML-% IV SOLN
20.0000 mg | Freq: Once | INTRAVENOUS | Status: AC
  Administered 2018-06-22: 20 mg via INTRAVENOUS

## 2018-06-22 NOTE — Progress Notes (Signed)
  [facility] OFFICE PROGRESS NOTE   Diagnosis: Multiple myeloma  INTERVAL HISTORY:   [name] began another cycle of pomalidomide/daratumumab/Decadron on 06/15/2018.  She reports tolerating the treatment well.  No symptom of venous thrombosis.  The cough has improved, but she has developed hoarseness.  Objective:  Vital signs in last 24 hours:  Blood pressure (!) 145/69, pulse (!) 104, temperature 98.6 F (37 C), temperature source Oral, resp. rate 18, height _0  (1.549 m), weight 180 lb 6.4 oz (81.8 kg), SpO2 100 %.    HEENT: No thrush Resp: Lungs clear bilaterally, no respiratory distress Cardio: Regular rate and rhythm GI: No hepatosplenomegaly Vascular: No leg edema  Portacath/PICC-without erythema  Lab Results:  Lab Results  Component Value Date   WBC 5.2 06/22/2018   HGB 8.7 (L) 06/22/2018   HCT 30.7 (L) 06/22/2018   MCV 80.6 06/22/2018   PLT 295 06/22/2018   NEUTROABS 3.8 06/22/2018    CMP  Lab Results  Component Value Date   NA 139 05/25/2018   K 4.6 05/25/2018   CL 107 05/25/2018   CO2 24 05/25/2018   GLUCOSE 110 (H) 05/25/2018   BUN 12 05/25/2018   CREATININE 0.68 05/25/2018   CALCIUM 8.9 05/25/2018   PROT 6.0 (L) 05/25/2018   ALBUMIN 3.2 (L) 05/25/2018   AST 33 05/25/2018   ALT 35 05/25/2018   ALKPHOS 56 05/25/2018   BILITOT 0.4 05/25/2018   GFRNONAA >60 05/25/2018   GFRAA >60 05/25/2018    06/07/2018: IgA 69, serum M spike 0.1 Medications: I have reviewed the patient's current medications.   Assessment/Plan:  1.Multiple myeloma, IgA kappa diagnosed on bone marrow biopsy 09/22/2017  57% plasma cells on initial bone marrow biopsy, kappa light chain restricted  Normal cytogenetics, FISH with -4, -14, and 13q-abnormalities  PET scan 10/16/2017-negative for bone lesions  Cycle 1 RVD 10/23/2017  Cycle 2 RVD 11/13/2017  Cycle 3 RVD 12/04/2017 (Revlimid dose reduced to 15 mg)  Cycle 4 RVD 12/29/2017  Cycle 5 RVD  01/26/2018  Cycle 1 carfilzomib/Cytoxan/Decadron 03/02/2018 (day 16 treatment held secondary to patient preference)  Cycle2carfilzomib/Cytoxan/Decadron 03/30/2018  Improvement, but persistent elevation of the kappa/lambda ratio, transplant team recommended changing treatment to daratumumab/pomalidomide/Decadron  Cycle 1 daratumumab/pomalidomide/Decadron beginning 05/11/2018 (day 22 daratumumab held secondary to an upper respiratory infection)  Cycle 2 daratumumab/pomalidomide/Decadron beginning 06/08/2018  2.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 2.4 cm solid right thyroid mass, follow-up ultrasound recommended  Biopsy 01/14/2018-atypia of undetermined significance or follicular lesion of undetermined significance  Affirmatesting- likely a benign lesion  6.Right rib fractures 12/21/2017    Disposition: [name] appears stable.  She is tolerating the pomalidomide and daratumumab well.  The markers of myeloma were improved on 06/07/2018.  We will follow-up on the serum light chains from today.  She will complete day 15 daratumumab today.  [name] is scheduled for an office visit at [facility] on 07/05/2018.  She will return for an office visit here with the plan to begin cycle 3 pomalidomide/daratumumab/Decadron on 07/06/2018.  Betsy Coder, MD  06/22/2018  9:28 AM

## 2018-06-23 LAB — KAPPA/LAMBDA LIGHT CHAINS
Kappa free light chain: 17 mg/L (ref 3.3–19.4)
Kappa, lambda light chain ratio: 4.36 — ABNORMAL HIGH (ref 0.26–1.65)
Lambda free light chains: 3.9 mg/L — ABNORMAL LOW (ref 5.7–26.3)

## 2018-06-24 ENCOUNTER — Telehealth: Payer: Self-pay | Admitting: *Deleted

## 2018-06-24 ENCOUNTER — Telehealth: Payer: Self-pay | Admitting: Oncology

## 2018-06-24 ENCOUNTER — Other Ambulatory Visit: Payer: Self-pay | Admitting: Oncology

## 2018-06-24 DIAGNOSIS — C9000 Multiple myeloma not having achieved remission: Secondary | ICD-10-CM

## 2018-06-24 NOTE — Telephone Encounter (Signed)
-----   Message from Meghan Pier, MD sent at 06/24/2018  7:46 AM EST ----- Please call patient, the kappa light chains are lower and now in the normal range

## 2018-06-24 NOTE — Telephone Encounter (Signed)
Patient made aware of her kappa/lamda chain ratio results.

## 2018-06-24 NOTE — Telephone Encounter (Signed)
Scheduled appt per 12/24 los - pt to get an  Updated schedule next visit.

## 2018-06-29 ENCOUNTER — Other Ambulatory Visit: Payer: Self-pay

## 2018-06-29 ENCOUNTER — Ambulatory Visit: Payer: Self-pay

## 2018-07-03 ENCOUNTER — Other Ambulatory Visit: Payer: Self-pay | Admitting: Oncology

## 2018-07-05 DIAGNOSIS — C9000 Multiple myeloma not having achieved remission: Secondary | ICD-10-CM | POA: Diagnosis not present

## 2018-07-05 DIAGNOSIS — R0600 Dyspnea, unspecified: Secondary | ICD-10-CM | POA: Diagnosis not present

## 2018-07-05 DIAGNOSIS — R0602 Shortness of breath: Secondary | ICD-10-CM | POA: Diagnosis not present

## 2018-07-06 ENCOUNTER — Inpatient Hospital Stay: Payer: PPO

## 2018-07-06 ENCOUNTER — Other Ambulatory Visit: Payer: Self-pay | Admitting: *Deleted

## 2018-07-06 ENCOUNTER — Inpatient Hospital Stay (HOSPITAL_BASED_OUTPATIENT_CLINIC_OR_DEPARTMENT_OTHER): Payer: PPO | Admitting: Oncology

## 2018-07-06 ENCOUNTER — Inpatient Hospital Stay: Payer: PPO | Attending: Hematology and Oncology

## 2018-07-06 VITALS — BP 141/72 | HR 99 | Temp 98.2°F | Resp 18 | Ht 61.0 in | Wt 179.8 lb

## 2018-07-06 VITALS — BP 114/59 | HR 98 | Temp 98.1°F | Resp 18

## 2018-07-06 DIAGNOSIS — Z5112 Encounter for antineoplastic immunotherapy: Secondary | ICD-10-CM | POA: Diagnosis not present

## 2018-07-06 DIAGNOSIS — R0609 Other forms of dyspnea: Secondary | ICD-10-CM | POA: Diagnosis not present

## 2018-07-06 DIAGNOSIS — C9000 Multiple myeloma not having achieved remission: Secondary | ICD-10-CM | POA: Diagnosis not present

## 2018-07-06 DIAGNOSIS — R05 Cough: Secondary | ICD-10-CM | POA: Diagnosis not present

## 2018-07-06 DIAGNOSIS — R5381 Other malaise: Secondary | ICD-10-CM | POA: Diagnosis not present

## 2018-07-06 DIAGNOSIS — D649 Anemia, unspecified: Secondary | ICD-10-CM

## 2018-07-06 DIAGNOSIS — R062 Wheezing: Secondary | ICD-10-CM | POA: Insufficient documentation

## 2018-07-06 DIAGNOSIS — Z95828 Presence of other vascular implants and grafts: Secondary | ICD-10-CM

## 2018-07-06 LAB — CMP (CANCER CENTER ONLY)
ALT: 31 U/L (ref 0–44)
ANION GAP: 10 (ref 5–15)
AST: 25 U/L (ref 15–41)
Albumin: 3.2 g/dL — ABNORMAL LOW (ref 3.5–5.0)
Alkaline Phosphatase: 56 U/L (ref 38–126)
BUN: 12 mg/dL (ref 8–23)
CO2: 21 mmol/L — ABNORMAL LOW (ref 22–32)
Calcium: 7.9 mg/dL — ABNORMAL LOW (ref 8.9–10.3)
Chloride: 108 mmol/L (ref 98–111)
Creatinine: 0.73 mg/dL (ref 0.44–1.00)
GFR, Est AFR Am: >60 mL/min (ref >60)
GFR, Estimated: >60 mL/min (ref >60)
Glucose, Bld: 104 mg/dL — ABNORMAL HIGH (ref 70–99)
Potassium: 4.4 mmol/L (ref 3.5–5.1)
Sodium: 139 mmol/L (ref 135–145)
Total Bilirubin: 0.3 mg/dL (ref 0.3–1.2)
Total Protein: 5.8 g/dL — ABNORMAL LOW (ref 6.5–8.1)

## 2018-07-06 LAB — CBC WITH DIFFERENTIAL (CANCER CENTER ONLY)
Abs Immature Granulocytes: 0.03 10*3/uL (ref 0.00–0.07)
BASOS PCT: 1 %
Basophils Absolute: 0.1 10*3/uL (ref 0.0–0.1)
EOS ABS: 0.2 10*3/uL (ref 0.0–0.5)
Eosinophils Relative: 4 %
HCT: 29 % — ABNORMAL LOW (ref 36.0–46.0)
Hemoglobin: 8.4 g/dL — ABNORMAL LOW (ref 12.0–15.0)
Immature Granulocytes: 1 %
Lymphocytes Relative: 25 %
Lymphs Abs: 1.6 10*3/uL (ref 0.7–4.0)
MCH: 22.8 pg — ABNORMAL LOW (ref 26.0–34.0)
MCHC: 29 g/dL — ABNORMAL LOW (ref 30.0–36.0)
MCV: 78.8 fL — ABNORMAL LOW (ref 80.0–100.0)
Monocytes Absolute: 0.9 10*3/uL (ref 0.1–1.0)
Monocytes Relative: 13 %
Neutro Abs: 3.7 10*3/uL (ref 1.7–7.7)
Neutrophils Relative %: 56 %
Platelet Count: 326 10*3/uL (ref 150–400)
RBC: 3.68 MIL/uL — ABNORMAL LOW (ref 3.87–5.11)
RDW: 18 % — ABNORMAL HIGH (ref 11.5–15.5)
WBC Count: 6.4 10*3/uL (ref 4.0–10.5)
nRBC: 0 % (ref 0.0–0.2)

## 2018-07-06 MED ORDER — FAMOTIDINE IN NACL 20-0.9 MG/50ML-% IV SOLN
20.0000 mg | Freq: Once | INTRAVENOUS | Status: AC
  Administered 2018-07-06: 20 mg via INTRAVENOUS

## 2018-07-06 MED ORDER — SODIUM CHLORIDE 0.9% FLUSH
10.0000 mL | INTRAVENOUS | Status: DC | PRN
  Administered 2018-07-06: 10 mL
  Filled 2018-07-06: qty 10

## 2018-07-06 MED ORDER — DIPHENHYDRAMINE HCL 25 MG PO CAPS
50.0000 mg | ORAL_CAPSULE | Freq: Once | ORAL | Status: AC
  Administered 2018-07-06: 50 mg via ORAL

## 2018-07-06 MED ORDER — MONTELUKAST SODIUM 10 MG PO TABS
10.0000 mg | ORAL_TABLET | Freq: Once | ORAL | Status: AC
  Administered 2018-07-06: 10 mg via ORAL

## 2018-07-06 MED ORDER — SODIUM CHLORIDE 0.9% FLUSH
10.0000 mL | INTRAVENOUS | Status: DC | PRN
  Administered 2018-07-06: 10 mL via INTRAVENOUS
  Filled 2018-07-06: qty 10

## 2018-07-06 MED ORDER — MONTELUKAST SODIUM 10 MG PO TABS
ORAL_TABLET | ORAL | Status: AC
  Filled 2018-07-06: qty 1

## 2018-07-06 MED ORDER — SODIUM CHLORIDE 0.9 % IV SOLN
16.2000 mg/kg | Freq: Once | INTRAVENOUS | Status: AC
  Administered 2018-07-06: 1300 mg via INTRAVENOUS
  Filled 2018-07-06: qty 60

## 2018-07-06 MED ORDER — HEPARIN SOD (PORK) LOCK FLUSH 100 UNIT/ML IV SOLN
500.0000 [IU] | Freq: Once | INTRAVENOUS | Status: AC | PRN
  Administered 2018-07-06: 500 [IU]
  Filled 2018-07-06: qty 5

## 2018-07-06 MED ORDER — DIPHENHYDRAMINE HCL 25 MG PO CAPS
ORAL_CAPSULE | ORAL | Status: AC
  Filled 2018-07-06: qty 2

## 2018-07-06 MED ORDER — SODIUM CHLORIDE 0.9 % IV SOLN
20.0000 mg | Freq: Once | INTRAVENOUS | Status: AC
  Administered 2018-07-06: 20 mg via INTRAVENOUS
  Filled 2018-07-06: qty 2

## 2018-07-06 MED ORDER — ACETAMINOPHEN 325 MG PO TABS
ORAL_TABLET | ORAL | Status: AC
  Filled 2018-07-06: qty 2

## 2018-07-06 MED ORDER — ACETAMINOPHEN 325 MG PO TABS
650.0000 mg | ORAL_TABLET | Freq: Once | ORAL | Status: AC
  Administered 2018-07-06: 650 mg via ORAL

## 2018-07-06 MED ORDER — SODIUM CHLORIDE 0.9 % IV SOLN
Freq: Once | INTRAVENOUS | Status: AC
  Administered 2018-07-06: 11:00:00 via INTRAVENOUS
  Filled 2018-07-06: qty 250

## 2018-07-06 MED ORDER — GUAIFENESIN-CODEINE 100-10 MG/5ML PO SOLN: 5.0000 mL | Freq: Three times a day (TID) | ORAL | 0 refills | Status: DC | PRN

## 2018-07-06 MED ORDER — FAMOTIDINE IN NACL 20-0.9 MG/50ML-% IV SOLN
INTRAVENOUS | Status: AC
  Filled 2018-07-06: qty 50

## 2018-07-06 NOTE — Patient Instructions (Signed)
[facility] Discharge Instructions for Patients Receiving Chemotherapy  Today you received the following chemotherapy agents Daratumumab(Darzalex)  To help prevent nausea and vomiting after your treatment, we encourage you to take your nausea medication as directed.    If you develop nausea and vomiting that is not controlled by your nausea medication, call the clinic.   BELOW ARE SYMPTOMS THAT SHOULD BE REPORTED IMMEDIATELY:  *FEVER GREATER THAN 100.5 F  *CHILLS WITH OR WITHOUT FEVER  NAUSEA AND VOMITING THAT IS NOT CONTROLLED WITH YOUR NAUSEA MEDICATION  *UNUSUAL SHORTNESS OF BREATH  *UNUSUAL BRUISING OR BLEEDING  TENDERNESS IN MOUTH AND THROAT WITH OR WITHOUT PRESENCE OF ULCERS  *URINARY PROBLEMS  *BOWEL PROBLEMS  UNUSUAL RASH Items with * indicate a potential emergency and should be followed up as soon as possible.  Feel free to call the clinic should you have any questions or concerns. The clinic phone number is [phone].  Please show the CHEMO ALERT CARD at check-in to the Emergency Department and triage nurse.   

## 2018-07-06 NOTE — Progress Notes (Signed)
[name] OFFICE PROGRESS NOTE   Diagnosis: Multiple myeloma  INTERVAL HISTORY:   [name] returns as scheduled.  She began another cycle of pomalidomide/daratumumab/Decadron on 06/15/2018.  No mouth sores, neuropathy symptoms, or sign of allergic reaction.  She reports an intermittent cough for the past several months.  She has mild exertional dyspnea.  She has increased malaise.  She saw Dr. Norma Fredrickson yesterday.  She reports he recommends proceeding with pomalidomide/daratumumab/Decadron.  She was prescribed an inhaler.  Objective:  Vital signs in last 24 hours:  Blood pressure (!) 141/72, pulse 99, temperature 98.2 F (36.8 C), temperature source Oral, resp. rate 18, height 5' 1"  (1.549 m), weight 179 lb 12.8 oz (81.6 kg), SpO2 99 %.    HEENT: No thrush or ulcers Resp: Lungs clear bilaterally, no respiratory distress Cardio: Regular rate and rhythm GI: No hepatosplenomegaly Vascular: No leg edema, the left lower leg is larger than the right side   Portacath/PICC-without erythema  Lab Results:  Lab Results  Component Value Date   WBC 6.4 07/06/2018   HGB 8.4 (L) 07/06/2018   HCT 29.0 (L) 07/06/2018   MCV 78.8 (L) 07/06/2018   PLT 326 07/06/2018   NEUTROABS 3.7 07/06/2018    CMP  Lab Results  Component Value Date   NA 139 05/25/2018   K 4.6 05/25/2018   CL 107 05/25/2018   CO2 24 05/25/2018   GLUCOSE 110 (H) 05/25/2018   BUN 12 05/25/2018   CREATININE 0.68 05/25/2018   CALCIUM 8.9 05/25/2018   PROT 6.0 (L) 05/25/2018   ALBUMIN 3.2 (L) 05/25/2018   AST 33 05/25/2018   ALT 35 05/25/2018   ALKPHOS 56 05/25/2018   BILITOT 0.4 05/25/2018   GFRNONAA >60 05/25/2018   GFRAA >60 05/25/2018  06/07/2018: Serum protein electrophoresis- M spike 0.1, IgA level 69 Kappa light chains on 04/19/2018: 62.6   Medications: I have reviewed the patient's current medications.   Assessment/Plan:  1.Multiple myeloma, IgA kappa diagnosed on bone marrow  biopsy 09/22/2017  57% plasma cells on initial bone marrow biopsy, kappa light chain restricted  Normal cytogenetics, FISH with -4, -14, and 13q-abnormalities  PET scan 10/16/2017-negative for bone lesions  Cycle 1 RVD 10/23/2017  Cycle 2 RVD 11/13/2017  Cycle 3 RVD 12/04/2017 (Revlimid dose reduced to 15 mg)  Cycle 4 RVD 12/29/2017  Cycle 5 RVD 01/26/2018  Cycle 1 carfilzomib/Cytoxan/Decadron 03/02/2018 (day 16 treatment held secondary to patient preference)  Cycle2carfilzomib/Cytoxan/Decadron 03/30/2018  Improvement, but persistent elevation of the kappa/lambda ratio, transplant team recommended changing treatment to daratumumab/pomalidomide/Decadron  Cycle 1 daratumumab/pomalidomide/Decadron beginning 05/11/2018 (day 22 daratumumab held secondary to an upper respiratory infection)  Cycle 2 daratumumab/pomalidomide/Decadron beginning 06/08/2018  IgA level and serum light chains improved December 2019  Cycle 3 daratumumab/pomalidomide/Decadron beginning 07/06/2018  2.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 2.4 cm solid right thyroid mass, follow-up ultrasound recommended  Biopsy 01/14/2018-atypia of undetermined significance or follicular lesion of undetermined significance  Affirmatesting- likely a benign lesion  6.Right rib fractures 12/21/2017  7.  Cough/dyspnea     Disposition: [name] appears stable.  The myeloma appears to be responding to the current regimen.  She will begin cycle 3 daratumumab/pomalidomide/Decadron today. She has persistent anemia, likely secondary to chemotherapy.  I suspect her malaise is secondary to anemia.  We will consider  a red cell transfusion if the hemoglobin falls further.  The cough and  wheezing may be related to daratumumab.  This has been present for several months.  She was prescribed an inhaler by Dr. Norma Fredrickson.  The symptoms appear mild at present.  The plan is to continue daratumumab.   She will return for daratumumab on 07/13/2018.  She is scheduled for an office visit 07/20/2018.  Betsy Coder, MD  07/06/2018  10:00 AM

## 2018-07-07 ENCOUNTER — Telehealth: Payer: Self-pay | Admitting: Oncology

## 2018-07-07 NOTE — Telephone Encounter (Signed)
Called patient per 1/7 los - pt aware of time change

## 2018-07-12 ENCOUNTER — Telehealth: Payer: Self-pay | Admitting: *Deleted

## 2018-07-12 DIAGNOSIS — J069 Acute upper respiratory infection, unspecified: Secondary | ICD-10-CM | POA: Diagnosis not present

## 2018-07-12 DIAGNOSIS — R05 Cough: Secondary | ICD-10-CM | POA: Diagnosis not present

## 2018-07-12 DIAGNOSIS — C9000 Multiple myeloma not having achieved remission: Secondary | ICD-10-CM

## 2018-07-12 NOTE — Telephone Encounter (Signed)
Reports feeling very weak and "dragging". Asking if she can have a blood transfusion tomorrow or sometime this week?  Noted Hgb was 8.4 on 07/06/18.  Left VM and informed patient we will draw sample for blood bank tomorrow and RN will check to see if at least one unit can be transfused tomorrow if counts are still low.

## 2018-07-13 ENCOUNTER — Inpatient Hospital Stay (HOSPITAL_BASED_OUTPATIENT_CLINIC_OR_DEPARTMENT_OTHER): Payer: PPO | Admitting: Oncology

## 2018-07-13 ENCOUNTER — Inpatient Hospital Stay: Payer: PPO

## 2018-07-13 ENCOUNTER — Other Ambulatory Visit: Payer: Self-pay

## 2018-07-13 ENCOUNTER — Other Ambulatory Visit: Payer: Self-pay | Admitting: *Deleted

## 2018-07-13 ENCOUNTER — Ambulatory Visit: Payer: Self-pay | Admitting: Oncology

## 2018-07-13 VITALS — BP 109/51 | HR 90 | Temp 98.3°F | Resp 18

## 2018-07-13 DIAGNOSIS — Z5112 Encounter for antineoplastic immunotherapy: Secondary | ICD-10-CM | POA: Diagnosis not present

## 2018-07-13 DIAGNOSIS — C9000 Multiple myeloma not having achieved remission: Secondary | ICD-10-CM | POA: Diagnosis not present

## 2018-07-13 DIAGNOSIS — R05 Cough: Secondary | ICD-10-CM

## 2018-07-13 DIAGNOSIS — D61818 Other pancytopenia: Secondary | ICD-10-CM

## 2018-07-13 DIAGNOSIS — R062 Wheezing: Secondary | ICD-10-CM | POA: Diagnosis not present

## 2018-07-13 LAB — CBC WITH DIFFERENTIAL (CANCER CENTER ONLY)
ABS IMMATURE GRANULOCYTES: 0.04 10*3/uL (ref 0.00–0.07)
Basophils Absolute: 0.1 10*3/uL (ref 0.0–0.1)
Basophils Relative: 1 %
EOS PCT: 4 %
Eosinophils Absolute: 0.3 10*3/uL (ref 0.0–0.5)
HCT: 30.7 % — ABNORMAL LOW (ref 36.0–46.0)
HEMOGLOBIN: 8.9 g/dL — AB (ref 12.0–15.0)
Immature Granulocytes: 1 %
Lymphocytes Relative: 17 %
Lymphs Abs: 1 10*3/uL (ref 0.7–4.0)
MCH: 22.4 pg — ABNORMAL LOW (ref 26.0–34.0)
MCHC: 29 g/dL — ABNORMAL LOW (ref 30.0–36.0)
MCV: 77.3 fL — ABNORMAL LOW (ref 80.0–100.0)
Monocytes Absolute: 0.3 10*3/uL (ref 0.1–1.0)
Monocytes Relative: 6 %
Neutro Abs: 4.2 10*3/uL (ref 1.7–7.7)
Neutrophils Relative %: 71 %
Platelet Count: 349 10*3/uL (ref 150–400)
RBC: 3.97 MIL/uL (ref 3.87–5.11)
RDW: 17.6 % — ABNORMAL HIGH (ref 11.5–15.5)
WBC: 5.9 10*3/uL (ref 4.0–10.5)
nRBC: 0 % (ref 0.0–0.2)

## 2018-07-13 LAB — TYPE AND SCREEN
ABO/RH(D): A POS
Antibody Screen: POSITIVE

## 2018-07-13 LAB — SAMPLE TO BLOOD BANK

## 2018-07-13 LAB — PREPARE RBC (CROSSMATCH)

## 2018-07-13 MED ORDER — SODIUM CHLORIDE 0.9 % IV SOLN
16.2000 mg/kg | Freq: Once | INTRAVENOUS | Status: AC
  Administered 2018-07-13: 1300 mg via INTRAVENOUS
  Filled 2018-07-13: qty 60

## 2018-07-13 MED ORDER — HEPARIN SOD (PORK) LOCK FLUSH 100 UNIT/ML IV SOLN
500.0000 [IU] | Freq: Once | INTRAVENOUS | Status: AC | PRN
  Administered 2018-07-13: 500 [IU]
  Filled 2018-07-13: qty 5

## 2018-07-13 MED ORDER — SODIUM CHLORIDE 0.9 % IV SOLN
20.0000 mg | Freq: Once | INTRAVENOUS | Status: AC
  Administered 2018-07-13: 20 mg via INTRAVENOUS
  Filled 2018-07-13: qty 2

## 2018-07-13 MED ORDER — ACETAMINOPHEN 325 MG PO TABS
ORAL_TABLET | ORAL | Status: AC
  Filled 2018-07-13: qty 2

## 2018-07-13 MED ORDER — DIPHENHYDRAMINE HCL 25 MG PO CAPS
ORAL_CAPSULE | ORAL | Status: AC
  Filled 2018-07-13: qty 2

## 2018-07-13 MED ORDER — SODIUM CHLORIDE 0.9% FLUSH
10.0000 mL | Freq: Once | INTRAVENOUS | Status: AC | PRN
  Administered 2018-07-13: 10 mL
  Filled 2018-07-13: qty 10

## 2018-07-13 MED ORDER — FAMOTIDINE IN NACL 20-0.9 MG/50ML-% IV SOLN
INTRAVENOUS | Status: AC
  Filled 2018-07-13: qty 50

## 2018-07-13 MED ORDER — SODIUM CHLORIDE 0.9 % IV SOLN
Freq: Once | INTRAVENOUS | Status: AC
  Administered 2018-07-13: 11:00:00 via INTRAVENOUS
  Filled 2018-07-13: qty 250

## 2018-07-13 MED ORDER — DIPHENHYDRAMINE HCL 25 MG PO CAPS
50.0000 mg | ORAL_CAPSULE | Freq: Once | ORAL | Status: AC
  Administered 2018-07-13: 50 mg via ORAL

## 2018-07-13 MED ORDER — MONTELUKAST SODIUM 10 MG PO TABS: 10.0000 mg | ORAL_TABLET | Freq: Every day | ORAL | 2 refills | Status: DC

## 2018-07-13 MED ORDER — SODIUM CHLORIDE 0.9% FLUSH
10.0000 mL | INTRAVENOUS | Status: DC | PRN
  Administered 2018-07-13: 10 mL
  Filled 2018-07-13: qty 10

## 2018-07-13 MED ORDER — ACETAMINOPHEN 325 MG PO TABS
650.0000 mg | ORAL_TABLET | Freq: Once | ORAL | Status: AC
  Administered 2018-07-13: 650 mg via ORAL

## 2018-07-13 MED ORDER — FAMOTIDINE IN NACL 20-0.9 MG/50ML-% IV SOLN
20.0000 mg | Freq: Once | INTRAVENOUS | Status: AC
  Administered 2018-07-13: 20 mg via INTRAVENOUS

## 2018-07-13 MED ORDER — MONTELUKAST SODIUM 10 MG PO TABS: 10.0000 mg | ORAL_TABLET | Freq: Once | ORAL | Status: DC

## 2018-07-13 NOTE — Patient Instructions (Signed)
[facility] Discharge Instructions for Patients Receiving Chemotherapy  Today you received the following chemotherapy agents: Darzalex  To help prevent nausea and vomiting after your treatment, we encourage you to take your nausea medication as directed.    If you develop nausea and vomiting that is not controlled by your nausea medication, call the clinic.   BELOW ARE SYMPTOMS THAT SHOULD BE REPORTED IMMEDIATELY:  *FEVER GREATER THAN 100.5 F  *CHILLS WITH OR WITHOUT FEVER  NAUSEA AND VOMITING THAT IS NOT CONTROLLED WITH YOUR NAUSEA MEDICATION  *UNUSUAL SHORTNESS OF BREATH  *UNUSUAL BRUISING OR BLEEDING  TENDERNESS IN MOUTH AND THROAT WITH OR WITHOUT PRESENCE OF ULCERS  *URINARY PROBLEMS  *BOWEL PROBLEMS  UNUSUAL RASH Items with * indicate a potential emergency and should be followed up as soon as possible.  Feel free to call the clinic should you have any questions or concerns. The clinic phone number is [phone].  Please show the CHEMO ALERT CARD at check-in to the Emergency Department and triage nurse.   

## 2018-07-13 NOTE — Progress Notes (Signed)
Call from blood bank to report they have no blood that matches her specific needs due to being on daratumumab. Will send off request and should have blood tomorrow. She will need to be transfused tomorrow. Notified infusion nurse and high priority scheduling message sent for 1 unit transfusion appointment tomorrow.

## 2018-07-13 NOTE — Progress Notes (Signed)
Per Dr. Benay Spice ok to treat today with respiratory symptoms, chest x ray results from Dr. Shelia Media and CMET from 07/06/2018.

## 2018-07-13 NOTE — Progress Notes (Signed)
Patient has had significant coughing since November. MD feels it could be related to daratumumab. Pharmacist suggests patient take singular 10 mg every day to help with cough. MD will also order PFTs to assess lung function. Patient reported today she was diagnosed by PCP with pneumonia, however CXR does not document this. Patient had wanted transfusion today, however the daratumumab has changed her status and they have no blood that matches her now. Can receive blood tonight, but it will be to transfuse "least incompatible". Dr. Benay Spice prefers not to pursue this due to risk of reaction since her Hgb is 8.9 now. Explained this to patient and that normally do not transfuse unless Hgb is 8.0 or less. She understands and agrees not to risk reaction at this time as well. Blood bank notified to cancel TXM for tomorrow.

## 2018-07-13 NOTE — Progress Notes (Signed)
Per Dr. Benay Spice: Give 1 unit blood today. Orders entered and blood bank notified.

## 2018-07-13 NOTE — Progress Notes (Signed)
[place] OFFICE PROGRESS NOTE   Diagnosis: Multiple myeloma  INTERVAL HISTORY:   [name] returns for scheduled daratumumab.  She began another cycle of pomalidomide/daratumumab/Decadron on 07/06/2018.  She continues to have a cough and wheezing.  She reports malaise and mild exertional dyspnea.  The symptoms have been stable for the past several months.  She saw Dr. Concha Pyo today and was diagnosed with "pneumonia ".  She was prescribed azithromycin.  She has not started the antibiotic.  She denies fever.  Guaifenesin with codeine helps the cough.  Objective:  Vital signs in last 24 hours:  There were no vitals taken for this visit.    HEENT: No thrush Resp: Lungs clear bilaterally, no respiratory distress Cardio: Regular rate and rhythm Vascular: No leg edema, the left lower leg is slightly larger than the right side   Portacath/PICC-without erythema  Lab Results:  Lab Results  Component Value Date   WBC 5.9 07/13/2018   HGB 8.9 (L) 07/13/2018   HCT 30.7 (L) 07/13/2018   MCV 77.3 (L) 07/13/2018   PLT 349 07/13/2018   NEUTROABS 4.2 07/13/2018    CMP  Lab Results  Component Value Date   NA 139 07/06/2018   K 4.4 07/06/2018   CL 108 07/06/2018   CO2 21 (L) 07/06/2018   GLUCOSE 104 (H) 07/06/2018   BUN 12 07/06/2018   CREATININE 0.73 07/06/2018   CALCIUM 7.9 (L) 07/06/2018   PROT 5.8 (L) 07/06/2018   ALBUMIN 3.2 (L) 07/06/2018   AST 25 07/06/2018   ALT 31 07/06/2018   ALKPHOS 56 07/06/2018   BILITOT 0.3 07/06/2018   GFRNONAA >60 07/06/2018   GFRAA >60 07/06/2018     Medications: I have reviewed the patient's current medications.   Assessment/Plan: 1. Multiple myeloma, IgA kappa diagnosed on bone marrow biopsy 09/22/2017  57% plasma cells on initial bone marrow biopsy, kappa light chain restricted  Normal cytogenetics, FISH with -4, -14, and 13q-abnormalities  PET scan 10/16/2017-negative for bone lesions  Cycle 1 RVD  10/23/2017  Cycle 2 RVD 11/13/2017  Cycle 3 RVD 12/04/2017 (Revlimid dose reduced to 15 mg)  Cycle 4 RVD 12/29/2017  Cycle 5 RVD 01/26/2018  Cycle 1 carfilzomib/Cytoxan/Decadron 03/02/2018 (day 16 treatment held secondary to patient preference)  Cycle2carfilzomib/Cytoxan/Decadron 03/30/2018  Improvement, but persistent elevation of the kappa/lambda ratio, transplant team recommended changing treatment to daratumumab/pomalidomide/Decadron  Cycle 1 daratumumab/pomalidomide/Decadron beginning 05/11/2018 (day 22 daratumumab held secondary to an upper respiratory infection)  Cycle 2 daratumumab/pomalidomide/Decadron beginning 06/08/2018  IgA level and serum light chains improved December 2019  Cycle 3 daratumumab/pomalidomide/Decadron beginning 07/06/2018  2.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 2.4 cm solid right thyroid mass, follow-up ultrasound recommended  Biopsy 01/14/2018-atypia of undetermined significance or follicular lesion of undetermined significance  Affirmatesting- likely a benign lesion  6.Right rib fractures 12/21/2017  7.  Cough/dyspnea- potentially related to toxicity from daratumumab, daily Singulair started 07/13/2018     Disposition: [name] appears unchanged.  She continues to have a cough and wheezing.  Her symptoms may be related to chronic bronchitis/asthma or toxicity from daratumumab.  I suspect toxicity from daratumumab.  We discussed discontinuing daratumumab.  She understands the symptoms may worsen if she continues the current treatment.  She is insistent on continuing daratumumab.  I discussed the case with the  Cancer center pharmacy.  She will begin a trial of daily Singulair.  We will order pulmonary function  studies.  [name] will return for an office visit as scheduled in 1 week. 25 minutes were spent with the patient today.  The majority of the time was used for counseling and coordination of care.  Betsy Coder, MD  07/13/2018  3:42 PM

## 2018-07-14 ENCOUNTER — Ambulatory Visit: Payer: Self-pay

## 2018-07-14 ENCOUNTER — Telehealth: Payer: Self-pay | Admitting: *Deleted

## 2018-07-14 DIAGNOSIS — C9000 Multiple myeloma not having achieved remission: Secondary | ICD-10-CM

## 2018-07-14 DIAGNOSIS — D61818 Other pancytopenia: Secondary | ICD-10-CM

## 2018-07-14 MED ORDER — CALCIUM-VITAMIN D3 500-400 MG-UNIT PO TABS: 3.0000 | ORAL_TABLET | Freq: Every day | ORAL | Status: AC

## 2018-07-14 NOTE — Telephone Encounter (Signed)
Called patient to follow up on her calcium intake. She reports taking Ca+/Vit D gummies, each having 500 mg of Ca+--she takes #3/day and also her MVI has 300 mg in each tablet and she takes #2/day for total calcium intake of 2100 mg daily.  Corrected calcium level based on last CMet= 8.54. Informed her that Dr. Benay Spice has ordered ferritin, iron and ibc with next week's labs and she was very agreeable to this. Also scheduled her PFT's at [facility] for 1/20 at 1045/1100. Provided instructions to check in at admitting at 1045. No smoking, caffeine, or inhalers for 4 hours prior to testing. She was able to repeat instructions back to nurse.

## 2018-07-16 DIAGNOSIS — F329 Major depressive disorder, single episode, unspecified: Secondary | ICD-10-CM | POA: Diagnosis not present

## 2018-07-16 DIAGNOSIS — M47817 Spondylosis without myelopathy or radiculopathy, lumbosacral region: Secondary | ICD-10-CM | POA: Diagnosis not present

## 2018-07-16 DIAGNOSIS — G894 Chronic pain syndrome: Secondary | ICD-10-CM | POA: Diagnosis not present

## 2018-07-16 DIAGNOSIS — M4807 Spinal stenosis, lumbosacral region: Secondary | ICD-10-CM | POA: Diagnosis not present

## 2018-07-19 ENCOUNTER — Ambulatory Visit (HOSPITAL_COMMUNITY)
Admission: RE | Admit: 2018-07-19 | Discharge: 2018-07-19 | Disposition: A | Discharge status: Home / Self Care | Payer: PPO | Source: Ambulatory Visit | Attending: Oncology | Admitting: Oncology

## 2018-07-19 DIAGNOSIS — C9000 Multiple myeloma not having achieved remission: Secondary | ICD-10-CM

## 2018-07-19 LAB — PULMONARY FUNCTION TEST
DL/VA % pred: 65 %
DL/VA: 2.78 ml/min/mmHg/L
DLCO cor % pred: 58 %
DLCO cor: 11.05 ml/min/mmHg
DLCO unc % pred: 48 %
DLCO unc: 9.15 ml/min/mmHg
FEF 25-75 POST: 1.68 L/s
FEF 25-75 Pre: 1.55 L/sec
FEF2575-%Change-Post: 8 %
FEF2575-%Pred-Post: 95 %
FEF2575-%Pred-Pre: 87 %
FEV1-%Change-Post: 0 %
FEV1-%Pred-Post: 90 %
FEV1-%Pred-Pre: 90 %
FEV1-POST: 1.78 L
FEV1-Pre: 1.77 L
FEV1FVC-%Change-Post: 1 %
FEV1FVC-%Pred-Pre: 101 %
FEV6-%Change-Post: -1 %
FEV6-%PRED-PRE: 91 %
FEV6-%Pred-Post: 90 %
FEV6-Post: 2.25 L
FEV6-Pre: 2.27 L
FEV6FVC-%Pred-Post: 104 %
FEV6FVC-%Pred-Pre: 104 %
FVC-%Change-Post: -1 %
FVC-%PRED-POST: 86 %
FVC-%Pred-Pre: 88 %
FVC-Post: 2.25 L
FVC-Pre: 2.28 L
Post FEV1/FVC ratio: 79 %
Post FEV6/FVC ratio: 100 %
Pre FEV1/FVC ratio: 78 %
Pre FEV6/FVC Ratio: 100 %
RV % pred: 136 %
RV: 2.66 L
TLC % PRED: 113 %
TLC: 5.06 L

## 2018-07-19 MED ORDER — ALBUTEROL SULFATE (2.5 MG/3ML) 0.083% IN NEBU
2.5000 mg | INHALATION_SOLUTION | Freq: Once | RESPIRATORY_TRACT | Status: AC
  Administered 2018-07-19: 2.5 mg via RESPIRATORY_TRACT

## 2018-07-20 ENCOUNTER — Inpatient Hospital Stay: Payer: PPO

## 2018-07-20 ENCOUNTER — Other Ambulatory Visit: Payer: Self-pay | Admitting: *Deleted

## 2018-07-20 ENCOUNTER — Inpatient Hospital Stay (HOSPITAL_BASED_OUTPATIENT_CLINIC_OR_DEPARTMENT_OTHER): Payer: PPO | Admitting: Oncology

## 2018-07-20 ENCOUNTER — Telehealth: Payer: Self-pay

## 2018-07-20 VITALS — BP 113/66 | HR 91 | Temp 98.5°F | Resp 19 | Ht 61.0 in | Wt 186.4 lb

## 2018-07-20 VITALS — BP 139/64 | HR 88 | Temp 98.6°F | Resp 16

## 2018-07-20 DIAGNOSIS — Z5112 Encounter for antineoplastic immunotherapy: Secondary | ICD-10-CM | POA: Diagnosis not present

## 2018-07-20 DIAGNOSIS — D649 Anemia, unspecified: Secondary | ICD-10-CM | POA: Diagnosis not present

## 2018-07-20 DIAGNOSIS — C9000 Multiple myeloma not having achieved remission: Secondary | ICD-10-CM | POA: Diagnosis not present

## 2018-07-20 DIAGNOSIS — R05 Cough: Secondary | ICD-10-CM | POA: Diagnosis not present

## 2018-07-20 DIAGNOSIS — D61818 Other pancytopenia: Secondary | ICD-10-CM

## 2018-07-20 DIAGNOSIS — R0609 Other forms of dyspnea: Secondary | ICD-10-CM | POA: Diagnosis not present

## 2018-07-20 LAB — FERRITIN: Ferritin: 4 ng/mL — ABNORMAL LOW (ref 11–307)

## 2018-07-20 LAB — CBC WITH DIFFERENTIAL (CANCER CENTER ONLY)
Abs Immature Granulocytes: 0.03 10*3/uL (ref 0.00–0.07)
Basophils Absolute: 0.1 10*3/uL (ref 0.0–0.1)
Basophils Relative: 1 %
Eosinophils Absolute: 0.2 10*3/uL (ref 0.0–0.5)
Eosinophils Relative: 4 %
HCT: 27.9 % — ABNORMAL LOW (ref 36.0–46.0)
Hemoglobin: 8.1 g/dL — ABNORMAL LOW (ref 12.0–15.0)
Immature Granulocytes: 1 %
Lymphocytes Relative: 23 %
Lymphs Abs: 1.3 10*3/uL (ref 0.7–4.0)
MCH: 22.3 pg — ABNORMAL LOW (ref 26.0–34.0)
MCHC: 29 g/dL — ABNORMAL LOW (ref 30.0–36.0)
MCV: 76.9 fL — ABNORMAL LOW (ref 80.0–100.0)
Monocytes Absolute: 0.9 10*3/uL (ref 0.1–1.0)
Monocytes Relative: 17 %
Neutro Abs: 3.1 10*3/uL (ref 1.7–7.7)
Neutrophils Relative %: 54 %
Platelet Count: 289 10*3/uL (ref 150–400)
RBC: 3.63 MIL/uL — ABNORMAL LOW (ref 3.87–5.11)
RDW: 18.1 % — AB (ref 11.5–15.5)
WBC Count: 5.6 10*3/uL (ref 4.0–10.5)
nRBC: 0 % (ref 0.0–0.2)

## 2018-07-20 LAB — IRON AND TIBC
Iron: 22 ug/dL — ABNORMAL LOW (ref 41–142)
Saturation Ratios: 4 % — ABNORMAL LOW (ref 21–57)
TIBC: 501 ug/dL — ABNORMAL HIGH (ref 236–444)
UIBC: 479 ug/dL — ABNORMAL HIGH (ref 120–384)

## 2018-07-20 MED ORDER — FERROUS GLUCONATE 324 (38 FE) MG PO TABS: 324.0000 mg | ORAL_TABLET | Freq: Three times a day (TID) | ORAL | 0 refills | Status: DC

## 2018-07-20 MED ORDER — SODIUM CHLORIDE 0.9% FLUSH
10.0000 mL | Freq: Once | INTRAVENOUS | Status: AC | PRN
  Administered 2018-07-20: 10 mL
  Filled 2018-07-20: qty 10

## 2018-07-20 MED ORDER — ACETAMINOPHEN 325 MG PO TABS
ORAL_TABLET | ORAL | Status: AC
  Filled 2018-07-20: qty 2

## 2018-07-20 MED ORDER — FAMOTIDINE IN NACL 20-0.9 MG/50ML-% IV SOLN
INTRAVENOUS | Status: AC
  Filled 2018-07-20: qty 50

## 2018-07-20 MED ORDER — SODIUM CHLORIDE 0.9 % IV SOLN
Freq: Once | INTRAVENOUS | Status: AC
  Administered 2018-07-20: 13:00:00 via INTRAVENOUS
  Filled 2018-07-20: qty 250

## 2018-07-20 MED ORDER — FAMOTIDINE IN NACL 20-0.9 MG/50ML-% IV SOLN
20.0000 mg | Freq: Once | INTRAVENOUS | Status: AC
  Administered 2018-07-20: 20 mg via INTRAVENOUS

## 2018-07-20 MED ORDER — MONTELUKAST SODIUM 10 MG PO TABS
10.0000 mg | ORAL_TABLET | Freq: Once | ORAL | Status: AC
  Administered 2018-07-20: 10 mg via ORAL

## 2018-07-20 MED ORDER — HEPARIN SOD (PORK) LOCK FLUSH 100 UNIT/ML IV SOLN
500.0000 [IU] | Freq: Once | INTRAVENOUS | Status: AC | PRN
  Administered 2018-07-20: 500 [IU]
  Filled 2018-07-20: qty 5

## 2018-07-20 MED ORDER — DIPHENHYDRAMINE HCL 25 MG PO CAPS
ORAL_CAPSULE | ORAL | Status: AC
  Filled 2018-07-20: qty 2

## 2018-07-20 MED ORDER — DIPHENHYDRAMINE HCL 25 MG PO CAPS
50.0000 mg | ORAL_CAPSULE | Freq: Once | ORAL | Status: AC
  Administered 2018-07-20: 50 mg via ORAL

## 2018-07-20 MED ORDER — SODIUM CHLORIDE 0.9 % IV SOLN
20.0000 mg | Freq: Once | INTRAVENOUS | Status: AC
  Administered 2018-07-20: 20 mg via INTRAVENOUS
  Filled 2018-07-20: qty 2

## 2018-07-20 MED ORDER — MONTELUKAST SODIUM 10 MG PO TABS
ORAL_TABLET | ORAL | Status: AC
  Filled 2018-07-20: qty 1

## 2018-07-20 MED ORDER — ACETAMINOPHEN 325 MG PO TABS
650.0000 mg | ORAL_TABLET | Freq: Once | ORAL | Status: AC
  Administered 2018-07-20: 650 mg via ORAL

## 2018-07-20 MED ORDER — SODIUM CHLORIDE 0.9% FLUSH
10.0000 mL | INTRAVENOUS | Status: DC | PRN
  Administered 2018-07-20: 10 mL
  Filled 2018-07-20: qty 10

## 2018-07-20 MED ORDER — SODIUM CHLORIDE 0.9 % IV SOLN
16.2000 mg/kg | Freq: Once | INTRAVENOUS | Status: AC
  Administered 2018-07-20: 1300 mg via INTRAVENOUS
  Filled 2018-07-20: qty 60

## 2018-07-20 NOTE — Progress Notes (Addendum)
[place] OFFICE PROGRESS NOTE   Diagnosis: Multiple myeloma  INTERVAL HISTORY:   [name] returns as scheduled.  She began another cycle of pomalidomide/Decadron on 07/06/2018.  She continues weekly daratumumab.  She reports improvement in wheezing since she began daily Singulair.  She continues to have an intermittent cough.  She complains of exertional dyspnea.  Pulmonary function studies on 07/19/2018 revealed a minimal obstructive defect, moderate decrease in DLCO (calculated based on a hemoglobin of 13.4). She denies fever.   Objective:  Vital signs in last 24 hours:  Blood pressure 113/66, pulse 91, temperature 98.5 F (36.9 C), temperature source Oral, resp. rate 19, height 5' 1"  (1.549 m), weight 186 lb 6.4 oz (84.6 kg), SpO2 99 %.    HEENT: No thrush Resp: Lungs clear bilaterally Cardio: Regular rate and rhythm GI: No hepatosplenomegaly Vascular: No leg edema, left lower leg is larger than the right side   Portacath/PICC-without erythema  Lab Results:  Lab Results  Component Value Date   WBC 5.6 07/20/2018   HGB 8.1 (L) 07/20/2018   HCT 27.9 (L) 07/20/2018   MCV 76.9 (L) 07/20/2018   PLT 289 07/20/2018   NEUTROABS 3.1 07/20/2018    CMP  Lab Results  Component Value Date   NA 139 07/06/2018   K 4.4 07/06/2018   CL 108 07/06/2018   CO2 21 (L) 07/06/2018   GLUCOSE 104 (H) 07/06/2018   BUN 12 07/06/2018   CREATININE 0.73 07/06/2018   CALCIUM 7.9 (L) 07/06/2018   PROT 5.8 (L) 07/06/2018   ALBUMIN 3.2 (L) 07/06/2018   AST 25 07/06/2018   ALT 31 07/06/2018   ALKPHOS 56 07/06/2018   BILITOT 0.3 07/06/2018   GFRNONAA >60 07/06/2018   GFRAA >60 07/06/2018  Ferritin-4  Free kappa light chains on 06/22/2018: 17 Medications: I have reviewed the patient's current medications.   Assessment/Plan: . Multiple myeloma, IgA kappa diagnosed on bone marrow biopsy 09/22/2017  57% plasma cells on initial bone marrow biopsy, kappa light chain  restricted  Normal cytogenetics, FISH with -4, -14, and 13q-abnormalities  PET scan 10/16/2017-negative for bone lesions  Cycle 1 RVD 10/23/2017  Cycle 2 RVD 11/13/2017  Cycle 3 RVD 12/04/2017 (Revlimid dose reduced to 15 mg)  Cycle 4 RVD 12/29/2017  Cycle 5 RVD 01/26/2018  Cycle 1 carfilzomib/Cytoxan/Decadron 03/02/2018 (day 16 treatment held secondary to patient preference)  Cycle2carfilzomib/Cytoxan/Decadron 03/30/2018  Improvement, but persistent elevation of the kappa/lambda ratio, transplant team recommended changing treatment to daratumumab/pomalidomide/Decadron  Cycle 1 daratumumab/pomalidomide/Decadron beginning 05/11/2018 (day 22 daratumumab held secondary to an upper respiratory infection)  Cycle 2 daratumumab/pomalidomide/Decadron beginning 06/08/2018  IgA level and serum light chains improved December 2019  Cycle 3 daratumumab/pomalidomide/Decadron beginning 07/06/2018  2.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 2.4 cm solid right thyroid mass, follow-up ultrasound recommended  Biopsy 01/14/2018-atypia of undetermined significance or follicular lesion of undetermined significance  Affirmatesting- likely a benign lesion  6.Right rib fractures 12/21/2017  7.  Cough/dyspnea- potentially related to toxicity from daratumumab, daily Singulair started 07/13/2018    Disposition: [name] appears unchanged.  I suspect the cough and wheezing are related to daratumumab.  She will continue Singulair.  We will ask for a corrected report on the DLCO based on her current hemoglobin.  The exertional dyspnea is likely secondary to anemia.  She is iron deficient.  I will recommend she begin ferrous sulfate and return stool Hemoccult  cards.  She will be scheduled for a red cell transfusion this week.  She has a positive antibody screen, likely related to daratumumab.  She underwent red cell phenotyping prior to beginning daratumumab.  I discussed the risk of a red cell transfusion reaction.  She is willing to accept the small chance of a transfusion reaction with "least incompatible "blood.  [name] will return for an office visit with the plan to begin the next cycle of pomalidomide in 2 weeks.  Daratumumab will be changed to an every 2-week schedule.  25 minutes were spent with the patient today.  The majority of the time was used for counseling and coordination of care.  Betsy Coder, MD  07/20/2018  11:16 AM

## 2018-07-20 NOTE — Telephone Encounter (Signed)
Oral Oncology Patient Advocate Encounter  Patient is in the office today and asked about getting a grant for her Pomalyst because [employer] has run out of funds. I was contacted and I was successful at securing a grant with [employer] (PAF) for $11,000. This will keep the out of pocket expense for Pomalys at $0. The grant information is as follows and has been shared with Psychologist, counselling.  Approval dates: 07/20/18-07/20/19 ID: [id number] Group: [id number] BIN: [id number] PCN: [id number]  I will give this information to the patient while she is in infusion today.   [place] Patient [place] Phone [phone] Fax [phone]

## 2018-07-20 NOTE — Progress Notes (Signed)
Spoke w/ Dr. Benay Spice, plan is to change zometa to every 3 month schedule so patient is not due today. Per med list, patient has started taking calcium supplements in setting of last low corrected calcium level.   [name], PharmD, [place] [facility] Pharmacist Pharmacy Phone: [phone] 07/20/2018

## 2018-07-20 NOTE — Patient Instructions (Signed)
[facility] Discharge Instructions for Patients Receiving Chemotherapy  Today you received the following chemotherapy agents: Darzalex  To help prevent nausea and vomiting after your treatment, we encourage you to take your nausea medication as directed.    If you develop nausea and vomiting that is not controlled by your nausea medication, call the clinic.   BELOW ARE SYMPTOMS THAT SHOULD BE REPORTED IMMEDIATELY:  *FEVER GREATER THAN 100.5 F  *CHILLS WITH OR WITHOUT FEVER  NAUSEA AND VOMITING THAT IS NOT CONTROLLED WITH YOUR NAUSEA MEDICATION  *UNUSUAL SHORTNESS OF BREATH  *UNUSUAL BRUISING OR BLEEDING  TENDERNESS IN MOUTH AND THROAT WITH OR WITHOUT PRESENCE OF ULCERS  *URINARY PROBLEMS  *BOWEL PROBLEMS  UNUSUAL RASH Items with * indicate a potential emergency and should be followed up as soon as possible.  Feel free to call the clinic should you have any questions or concerns. The clinic phone number is [phone].  Please show the CHEMO ALERT CARD at check-in to the Emergency Department and triage nurse.   

## 2018-07-21 ENCOUNTER — Encounter: Payer: Self-pay | Admitting: *Deleted

## 2018-07-21 ENCOUNTER — Other Ambulatory Visit: Payer: Self-pay | Admitting: Nurse Practitioner

## 2018-07-21 ENCOUNTER — Telehealth: Payer: Self-pay | Admitting: *Deleted

## 2018-07-21 ENCOUNTER — Other Ambulatory Visit: Payer: Self-pay | Admitting: Oncology

## 2018-07-21 DIAGNOSIS — C9000 Multiple myeloma not having achieved remission: Secondary | ICD-10-CM

## 2018-07-21 LAB — PROTEIN ELECTROPHORESIS, SERUM
A/G Ratio: 1.5 (ref 0.7–1.7)
Albumin ELP: 3.1 g/dL (ref 2.9–4.4)
Alpha-1-Globulin: 0.2 g/dL (ref 0.0–0.4)
Alpha-2-Globulin: 0.8 g/dL (ref 0.4–1.0)
Beta Globulin: 0.9 g/dL (ref 0.7–1.3)
Gamma Globulin: 0.2 g/dL — ABNORMAL LOW (ref 0.4–1.8)
Globulin, Total: 2.1 g/dL — ABNORMAL LOW (ref 2.2–3.9)
M-Spike, %: 0.4 g/dL — ABNORMAL HIGH
Total Protein ELP: 5.2 g/dL — ABNORMAL LOW (ref 6.0–8.5)

## 2018-07-21 MED ORDER — GUAIFENESIN-CODEINE 100-10 MG/5ML PO SOLN: 5.0000 mL | Freq: Three times a day (TID) | ORAL | 0 refills | Status: DC | PRN

## 2018-07-21 NOTE — Telephone Encounter (Signed)
Called to report [facility] did not get the [facility] script sent on 07/20/18. Also asking about her calcium level and current medication....does she need to take more? Also wants corrected results on the DLCO when received. Informed her that I confirmed w/[facility] that they did get the script, but had to special order the drug and it is there now. Informed her she is on the correct amount of calcium based on corrected calcium calculation due to low albumin. Will notify her when the final PFT results are in--just requested the correction today.

## 2018-07-21 NOTE — Progress Notes (Signed)
Dr. Benay Spice requests correction on PFTs DLCO based on recent Hgb 8.9 Called Tara at [place] and she took message to forward to technician.

## 2018-07-22 ENCOUNTER — Telehealth: Payer: Self-pay

## 2018-07-22 LAB — KAPPA/LAMBDA LIGHT CHAINS
Kappa free light chain: 11.9 mg/L (ref 3.3–19.4)
Kappa, lambda light chain ratio: 2.9 — ABNORMAL HIGH (ref 0.26–1.65)
Lambda free light chains: 4.1 mg/L — ABNORMAL LOW (ref 5.7–26.3)

## 2018-07-22 LAB — IGA: IgA: 22 mg/dL — ABNORMAL LOW (ref 87–352)

## 2018-07-22 NOTE — Telephone Encounter (Signed)
TC to pt per Dr. Benay Spice to let her know that the light chains are again better. Pt verbalized understanding. No further problems or concerns at this time.

## 2018-07-23 ENCOUNTER — Other Ambulatory Visit: Payer: Self-pay | Admitting: *Deleted

## 2018-07-23 ENCOUNTER — Other Ambulatory Visit: Payer: Self-pay | Admitting: Physical Medicine and Rehabilitation

## 2018-07-23 ENCOUNTER — Telehealth: Payer: Self-pay | Admitting: *Deleted

## 2018-07-23 DIAGNOSIS — M4807 Spinal stenosis, lumbosacral region: Secondary | ICD-10-CM

## 2018-07-23 DIAGNOSIS — C9000 Multiple myeloma not having achieved remission: Secondary | ICD-10-CM

## 2018-07-23 DIAGNOSIS — M545 Low back pain, unspecified: Secondary | ICD-10-CM

## 2018-07-23 NOTE — Telephone Encounter (Signed)
Notified patient that per Dr. Benay Spice, the [facility] is better at 58% with the adjustment on her Hgb. She was also made aware of the lab/flush on 1/30 and transfusion on 2/1.

## 2018-07-26 ENCOUNTER — Telehealth: Payer: Self-pay | Admitting: Oncology

## 2018-07-26 ENCOUNTER — Other Ambulatory Visit: Payer: Self-pay | Admitting: Oncology

## 2018-07-26 DIAGNOSIS — C9000 Multiple myeloma not having achieved remission: Secondary | ICD-10-CM

## 2018-07-26 NOTE — Telephone Encounter (Signed)
Pt. Sent RX request for Pomalyst refill, OK to refill per Dr. Benay Spice Pt to start medication 08/03/18 prescripton sent in to [place] authorization [id number]

## 2018-07-26 NOTE — Telephone Encounter (Signed)
Scheduled appt per 1/23 sch message - pt is aware of appt date and time  

## 2018-07-26 NOTE — Telephone Encounter (Signed)
Per Dr. Benay Spice OK to refilll

## 2018-07-28 ENCOUNTER — Other Ambulatory Visit: Payer: Self-pay | Admitting: *Deleted

## 2018-07-28 DIAGNOSIS — Z95828 Presence of other vascular implants and grafts: Secondary | ICD-10-CM

## 2018-07-28 MED ORDER — SODIUM CHLORIDE 0.9% FLUSH
10.0000 mL | Freq: Once | INTRAVENOUS | Status: DC
  Filled 2018-07-28: qty 10

## 2018-07-29 ENCOUNTER — Inpatient Hospital Stay: Payer: PPO

## 2018-07-29 ENCOUNTER — Other Ambulatory Visit: Payer: Self-pay | Admitting: *Deleted

## 2018-07-29 DIAGNOSIS — Z95828 Presence of other vascular implants and grafts: Secondary | ICD-10-CM

## 2018-07-29 DIAGNOSIS — Z5112 Encounter for antineoplastic immunotherapy: Secondary | ICD-10-CM | POA: Diagnosis not present

## 2018-07-29 DIAGNOSIS — C9000 Multiple myeloma not having achieved remission: Secondary | ICD-10-CM

## 2018-07-29 DIAGNOSIS — D649 Anemia, unspecified: Secondary | ICD-10-CM

## 2018-07-29 LAB — CBC WITH DIFFERENTIAL (CANCER CENTER ONLY)
Abs Immature Granulocytes: 0.02 10*3/uL (ref 0.00–0.07)
Basophils Absolute: 0.1 10*3/uL (ref 0.0–0.1)
Basophils Relative: 2 %
Eosinophils Absolute: 0.3 10*3/uL (ref 0.0–0.5)
Eosinophils Relative: 8 %
HCT: 29.4 % — ABNORMAL LOW (ref 36.0–46.0)
Hemoglobin: 8.3 g/dL — ABNORMAL LOW (ref 12.0–15.0)
IMMATURE GRANULOCYTES: 1 %
LYMPHS ABS: 1.1 10*3/uL (ref 0.7–4.0)
Lymphocytes Relative: 32 %
MCH: 21.6 pg — ABNORMAL LOW (ref 26.0–34.0)
MCHC: 28.2 g/dL — ABNORMAL LOW (ref 30.0–36.0)
MCV: 76.6 fL — ABNORMAL LOW (ref 80.0–100.0)
Monocytes Absolute: 0.7 10*3/uL (ref 0.1–1.0)
Monocytes Relative: 23 %
NEUTROS ABS: 1.1 10*3/uL — AB (ref 1.7–7.7)
Neutrophils Relative %: 34 %
Platelet Count: 239 10*3/uL (ref 150–400)
RBC: 3.84 MIL/uL — ABNORMAL LOW (ref 3.87–5.11)
RDW: 18.6 % — ABNORMAL HIGH (ref 11.5–15.5)
WBC Count: 3.2 10*3/uL — ABNORMAL LOW (ref 4.0–10.5)
nRBC: 0 % (ref 0.0–0.2)

## 2018-07-29 LAB — PREPARE RBC (CROSSMATCH)

## 2018-07-29 MED ORDER — SODIUM CHLORIDE 0.9% FLUSH
10.0000 mL | INTRAVENOUS | Status: DC | PRN
  Administered 2018-07-29: 10 mL via INTRAVENOUS
  Filled 2018-07-29: qty 10

## 2018-07-29 MED ORDER — HEPARIN SOD (PORK) LOCK FLUSH 100 UNIT/ML IV SOLN
500.0000 [IU] | Freq: Once | INTRAVENOUS | Status: AC
  Administered 2018-07-29: 500 [IU] via INTRAVENOUS
  Filled 2018-07-29: qty 5

## 2018-07-29 NOTE — Progress Notes (Signed)
Called blood bank to alert them that sample being sent over and sent two tubes due to difficult crossmatch since getting daratumumab. Transfusion orders entered and T & S and prepare released. Will transfuse on 07/31/18

## 2018-07-30 ENCOUNTER — Telehealth: Payer: Self-pay | Admitting: *Deleted

## 2018-07-30 LAB — SAMPLE TO BLOOD BANK

## 2018-07-30 NOTE — Telephone Encounter (Signed)
She is having an MRI at [place] on 08/16/18 @ 12:30 and is requesting [facility] access her port for the procedure. Radiology confirmed they will use the port if it is already accessed. Sent scheduling message for appointment.

## 2018-07-30 NOTE — Telephone Encounter (Signed)
Patient called with questions about her port access tomorrow. Called [facility] Blood bank and confirmed she is all set for transfusion tomorrow with them. Provided verbal order from Dr. Benay Spice that it is OK to transfuse "least incompatible" blood on 08/02/18.

## 2018-07-31 ENCOUNTER — Other Ambulatory Visit: Payer: Self-pay | Admitting: Oncology

## 2018-07-31 ENCOUNTER — Inpatient Hospital Stay: Payer: PPO | Attending: Hematology and Oncology

## 2018-07-31 DIAGNOSIS — D649 Anemia, unspecified: Secondary | ICD-10-CM | POA: Diagnosis not present

## 2018-07-31 DIAGNOSIS — R05 Cough: Secondary | ICD-10-CM | POA: Insufficient documentation

## 2018-07-31 DIAGNOSIS — Z5112 Encounter for antineoplastic immunotherapy: Secondary | ICD-10-CM | POA: Diagnosis not present

## 2018-07-31 DIAGNOSIS — C9000 Multiple myeloma not having achieved remission: Secondary | ICD-10-CM | POA: Diagnosis not present

## 2018-07-31 DIAGNOSIS — Z853 Personal history of malignant neoplasm of breast: Secondary | ICD-10-CM | POA: Insufficient documentation

## 2018-07-31 DIAGNOSIS — Z79899 Other long term (current) drug therapy: Secondary | ICD-10-CM | POA: Diagnosis not present

## 2018-07-31 MED ORDER — HEPARIN SOD (PORK) LOCK FLUSH 100 UNIT/ML IV SOLN
500.0000 [IU] | Freq: Every day | INTRAVENOUS | Status: AC | PRN
  Administered 2018-07-31: 500 [IU]
  Filled 2018-07-31: qty 5

## 2018-07-31 MED ORDER — DIPHENHYDRAMINE HCL 25 MG PO CAPS
25.0000 mg | ORAL_CAPSULE | Freq: Once | ORAL | Status: AC
  Administered 2018-07-31: 25 mg via ORAL

## 2018-07-31 MED ORDER — DIPHENHYDRAMINE HCL 25 MG PO CAPS
ORAL_CAPSULE | ORAL | Status: AC
  Filled 2018-07-31: qty 1

## 2018-07-31 MED ORDER — ACETAMINOPHEN 325 MG PO TABS
ORAL_TABLET | ORAL | Status: AC
  Filled 2018-07-31: qty 2

## 2018-07-31 MED ORDER — SODIUM CHLORIDE 0.9% IV SOLUTION
250.0000 mL | Freq: Once | INTRAVENOUS | Status: AC
  Administered 2018-07-31: 250 mL via INTRAVENOUS
  Filled 2018-07-31: qty 250

## 2018-07-31 MED ORDER — SODIUM CHLORIDE 0.9% FLUSH
10.0000 mL | INTRAVENOUS | Status: AC | PRN
  Administered 2018-07-31: 10 mL
  Filled 2018-07-31: qty 10

## 2018-07-31 MED ORDER — ACETAMINOPHEN 325 MG PO TABS
650.0000 mg | ORAL_TABLET | Freq: Once | ORAL | Status: AC
  Administered 2018-07-31: 650 mg via ORAL

## 2018-07-31 NOTE — Patient Instructions (Signed)
Blood Transfusion, Adult A blood transfusion is a procedure in which you are given blood through an IV tube. You may need this procedure because of:  Illness.  Surgery.  Injury. The blood may come from someone else (a donor). You may also be able to donate blood for yourself (autologous blood donation). The blood given in a transfusion is made up of different types of cells. You may get:  Red blood cells. These carry oxygen to the cells in the body.  White blood cells. These help you fight infections.  Platelets. These help your blood to clot.  Plasma. This is the liquid part of your blood. It helps with fluid imbalances. If you have a clotting disorder, you may also get other types of blood products. What happens before the procedure?  You will have a blood test to find out your blood type. The test also finds out what type of blood your body will accept and matches it to the donor type.  If you are going to have a planned surgery, you may be able to donate your own blood. This may be done in case you need a transfusion.  If you have had an allergic reaction to a transfusion in the past, you may be given medicine to help prevent a reaction. This medicine may be given to you by mouth or through an IV.  You will have your temperature, blood pressure, and pulse checked.  Follow instructions from your doctor about what you cannot eat or drink.  Ask your doctor about: ? Changing or stopping your regular medicines. This is important if you take diabetes medicines or blood thinners. ? Taking medicines such as aspirin and ibuprofen. These medicines can thin your blood. Do not take these medicines before your procedure if your doctor tells you not to. What happens during the procedure?  An IV tube will be put into one of your veins.  The bag of donated blood will be attached to your IV tube. Then, the blood will enter through your vein.  Your temperature, blood pressure, and pulse will  be checked regularly during the procedure. This is done to find early signs of a transfusion reaction.  If you have any signs or symptoms of a reaction, your transfusion will be stopped. You may also be given medicine.  When the transfusion is done, your IV tube will be taken out.  Pressure may be applied to the IV site for a few minutes.  A bandage (dressing) will be put on the IV site. The procedure may vary among doctors and hospitals. What happens after the procedure?  Your temperature, blood pressure, heart rate, breathing rate, and blood oxygen level will be checked often.  Your blood may be tested to see how you are responding to the transfusion.  You may be warmed with fluids or blankets. This is done to keep the temperature of your body normal. Summary  A blood transfusion is a procedure in which you are given blood through an IV tube.  The blood may come from someone else (a donor). You may also be able to donate blood for yourself.  If you have had an allergic reaction to a transfusion in the past, you may be given medicine to help prevent a reaction. This medicine may be given to you by mouth or through an IV tube.  Your temperature, blood pressure, heart rate, breathing rate, and blood oxygen level will be checked often.  Your blood may be tested to see   how you are responding to the transfusion. This information is not intended to replace advice given to you by your health care provider. Make sure you discuss any questions you have with your health care provider. Document Released: 09/12/2008 Document Revised: 02/08/2016 Document Reviewed: 02/08/2016 Elsevier Interactive Patient Education  2019 [employer]  

## 2018-08-01 LAB — TYPE AND SCREEN
ABO/RH(D): A POS
Antibody Screen: POSITIVE
Unit division: 0
Unit division: 0

## 2018-08-01 LAB — BPAM RBC
Blood Product Expiration Date: 202002292359
Blood Product Expiration Date: 202003082359
ISSUE DATE / TIME: 202002010952
ISSUE DATE / TIME: 202002010952
Unit Type and Rh: 5100
Unit Type and Rh: 6200

## 2018-08-03 ENCOUNTER — Inpatient Hospital Stay: Payer: PPO

## 2018-08-03 ENCOUNTER — Inpatient Hospital Stay (HOSPITAL_BASED_OUTPATIENT_CLINIC_OR_DEPARTMENT_OTHER): Payer: PPO | Admitting: Oncology

## 2018-08-03 VITALS — BP 132/78 | HR 77 | Temp 98.5°F | Resp 18 | Ht 61.0 in | Wt 190.8 lb

## 2018-08-03 VITALS — BP 140/70 | HR 82 | Temp 98.0°F | Resp 17

## 2018-08-03 DIAGNOSIS — C9000 Multiple myeloma not having achieved remission: Secondary | ICD-10-CM

## 2018-08-03 DIAGNOSIS — D649 Anemia, unspecified: Secondary | ICD-10-CM | POA: Diagnosis not present

## 2018-08-03 DIAGNOSIS — Z5112 Encounter for antineoplastic immunotherapy: Secondary | ICD-10-CM | POA: Diagnosis not present

## 2018-08-03 DIAGNOSIS — Z79899 Other long term (current) drug therapy: Secondary | ICD-10-CM

## 2018-08-03 DIAGNOSIS — Z95828 Presence of other vascular implants and grafts: Secondary | ICD-10-CM

## 2018-08-03 DIAGNOSIS — Z853 Personal history of malignant neoplasm of breast: Secondary | ICD-10-CM

## 2018-08-03 DIAGNOSIS — R05 Cough: Secondary | ICD-10-CM | POA: Diagnosis not present

## 2018-08-03 LAB — CBC WITH DIFFERENTIAL (CANCER CENTER ONLY)
Abs Immature Granulocytes: 0.01 10*3/uL (ref 0.00–0.07)
Basophils Absolute: 0 10*3/uL (ref 0.0–0.1)
Basophils Relative: 1 %
Eosinophils Absolute: 0.2 10*3/uL (ref 0.0–0.5)
Eosinophils Relative: 4 %
HCT: 34.9 % — ABNORMAL LOW (ref 36.0–46.0)
Hemoglobin: 10.2 g/dL — ABNORMAL LOW (ref 12.0–15.0)
Immature Granulocytes: 0 %
Lymphocytes Relative: 31 %
Lymphs Abs: 1.4 10*3/uL (ref 0.7–4.0)
MCH: 22.9 pg — ABNORMAL LOW (ref 26.0–34.0)
MCHC: 29.2 g/dL — ABNORMAL LOW (ref 30.0–36.0)
MCV: 78.4 fL — ABNORMAL LOW (ref 80.0–100.0)
MONOS PCT: 12 %
Monocytes Absolute: 0.6 10*3/uL (ref 0.1–1.0)
Neutro Abs: 2.4 10*3/uL (ref 1.7–7.7)
Neutrophils Relative %: 52 %
Platelet Count: 285 10*3/uL (ref 150–400)
RBC: 4.45 MIL/uL (ref 3.87–5.11)
RDW: 19.4 % — ABNORMAL HIGH (ref 11.5–15.5)
WBC Count: 4.6 10*3/uL (ref 4.0–10.5)
nRBC: 0 % (ref 0.0–0.2)

## 2018-08-03 LAB — SAMPLE TO BLOOD BANK

## 2018-08-03 MED ORDER — SODIUM CHLORIDE 0.9 % IV SOLN
20.0000 mg | Freq: Once | INTRAVENOUS | Status: AC
  Administered 2018-08-03: 20 mg via INTRAVENOUS
  Filled 2018-08-03: qty 2

## 2018-08-03 MED ORDER — ALBUTEROL SULFATE HFA 108 (90 BASE) MCG/ACT IN AERS: 2.0000 | INHALATION_SPRAY | Freq: Four times a day (QID) | RESPIRATORY_TRACT | 1 refills | Status: DC | PRN

## 2018-08-03 MED ORDER — SODIUM CHLORIDE 0.9% FLUSH
10.0000 mL | INTRAVENOUS | Status: DC | PRN
  Administered 2018-08-03: 10 mL
  Filled 2018-08-03: qty 10

## 2018-08-03 MED ORDER — DIPHENHYDRAMINE HCL 25 MG PO CAPS
50.0000 mg | ORAL_CAPSULE | Freq: Once | ORAL | Status: AC
  Administered 2018-08-03: 50 mg via ORAL

## 2018-08-03 MED ORDER — SODIUM CHLORIDE 0.9 % IV SOLN
Freq: Once | INTRAVENOUS | Status: AC
  Administered 2018-08-03: 11:00:00 via INTRAVENOUS
  Filled 2018-08-03: qty 250

## 2018-08-03 MED ORDER — FAMOTIDINE IN NACL 20-0.9 MG/50ML-% IV SOLN
20.0000 mg | Freq: Once | INTRAVENOUS | Status: AC
  Administered 2018-08-03: 20 mg via INTRAVENOUS

## 2018-08-03 MED ORDER — ACETAMINOPHEN 325 MG PO TABS
650.0000 mg | ORAL_TABLET | Freq: Once | ORAL | Status: AC
  Administered 2018-08-03: 650 mg via ORAL

## 2018-08-03 MED ORDER — MONTELUKAST SODIUM 10 MG PO TABS
ORAL_TABLET | ORAL | Status: AC
  Filled 2018-08-03: qty 1

## 2018-08-03 MED ORDER — FAMOTIDINE IN NACL 20-0.9 MG/50ML-% IV SOLN
INTRAVENOUS | Status: AC
  Filled 2018-08-03: qty 50

## 2018-08-03 MED ORDER — ACETAMINOPHEN 325 MG PO TABS
ORAL_TABLET | ORAL | Status: AC
  Filled 2018-08-03: qty 2

## 2018-08-03 MED ORDER — SODIUM CHLORIDE 0.9% FLUSH
10.0000 mL | INTRAVENOUS | Status: DC | PRN
  Administered 2018-08-03: 10 mL via INTRAVENOUS
  Filled 2018-08-03: qty 10

## 2018-08-03 MED ORDER — MONTELUKAST SODIUM 10 MG PO TABS
10.0000 mg | ORAL_TABLET | Freq: Once | ORAL | Status: AC
  Administered 2018-08-03: 10 mg via ORAL

## 2018-08-03 MED ORDER — SODIUM CHLORIDE 0.9 % IV SOLN
16.2000 mg/kg | Freq: Once | INTRAVENOUS | Status: AC
  Administered 2018-08-03: 1300 mg via INTRAVENOUS
  Filled 2018-08-03: qty 60

## 2018-08-03 MED ORDER — HEPARIN SOD (PORK) LOCK FLUSH 100 UNIT/ML IV SOLN
500.0000 [IU] | Freq: Once | INTRAVENOUS | Status: AC | PRN
  Administered 2018-08-03: 500 [IU]
  Filled 2018-08-03: qty 5

## 2018-08-03 MED ORDER — DIPHENHYDRAMINE HCL 25 MG PO CAPS
ORAL_CAPSULE | ORAL | Status: AC
  Filled 2018-08-03: qty 2

## 2018-08-03 NOTE — Addendum Note (Signed)
Addended by: Tania Ade on: 08/03/2018 12:12 PM   Modules accepted: Orders

## 2018-08-03 NOTE — Progress Notes (Signed)
[facility] OFFICE PROGRESS NOTE   Diagnosis: Multiple myeloma  INTERVAL HISTORY:   Ms. Gainer returns as scheduled.  She is due to begin another cycle of pomalidomide today.  She is now on an every 2-week daratumumab schedule.  No new complaint.  She continues to have an intermittent cough.  No diarrhea or neuropathy symptoms.  She was transfused 2 units of packed red blood cells on 07/31/2018.  She tolerated the transfusion well.  No symptom of an allergic reaction.  She reports mild improvement in her energy level following the red cell transfusion.  Objective:  Vital signs in last 24 hours:  Blood pressure 132/78, pulse 77, temperature 98.5 F (36.9 C), temperature source Oral, resp. rate 18, height 5' 1"  (1.549 m), weight 190 lb 12.8 oz (86.5 kg), SpO2 100 %.    HEENT: No thrush, small ulceration?  At the posterior left buccal mucosa Resp: Lungs clear bilaterally Cardio: Regular rate and rhythm GI: No hepatosplenomegaly Vascular: No leg edema, left lower leg is larger than the right side  Portacath/PICC-without erythema  Lab Results:  Lab Results  Component Value Date   WBC 4.6 08/03/2018   HGB 10.2 (L) 08/03/2018   HCT 34.9 (L) 08/03/2018   MCV 78.4 (L) 08/03/2018   PLT 285 08/03/2018   NEUTROABS 2.4 08/03/2018    CMP  Lab Results  Component Value Date   NA 139 07/06/2018   K 4.4 07/06/2018   CL 108 07/06/2018   CO2 21 (L) 07/06/2018   GLUCOSE 104 (H) 07/06/2018   BUN 12 07/06/2018   CREATININE 0.73 07/06/2018   CALCIUM 7.9 (L) 07/06/2018   PROT 5.8 (L) 07/06/2018   ALBUMIN 3.2 (L) 07/06/2018   AST 25 07/06/2018   ALT 31 07/06/2018   ALKPHOS 56 07/06/2018   BILITOT 0.3 07/06/2018   GFRNONAA >60 07/06/2018   GFRAA >60 07/06/2018   07/20/2018: Kappa light chains 11.9, IgA 22, Derm M spike-0.4  Medications: I have reviewed the patient's current medications.   Assessment/Plan:  1. Multiple myeloma, IgA kappa diagnosed on bone marrow  biopsy 09/22/2017  57% plasma cells on initial bone marrow biopsy, kappa light chain restricted  Normal cytogenetics, FISH with -4, -14, and 13q-abnormalities  PET scan 10/16/2017-negative for bone lesions  Cycle 1 RVD 10/23/2017  Cycle 2 RVD 11/13/2017  Cycle 3 RVD 12/04/2017 (Revlimid dose reduced to 15 mg)  Cycle 4 RVD 12/29/2017  Cycle 5 RVD 01/26/2018  Cycle 1 carfilzomib/Cytoxan/Decadron 03/02/2018 (day 16 treatment held secondary to patient preference)  Cycle2carfilzomib/Cytoxan/Decadron 03/30/2018  Improvement, but persistent elevation of the kappa/lambda ratio, transplant team recommended changing treatment to daratumumab/pomalidomide/Decadron  Cycle 1 daratumumab/pomalidomide/Decadron beginning 05/11/2018 (day 22 daratumumab held secondary to an upper respiratory infection)  Cycle 2 daratumumab/pomalidomide/Decadron beginning 06/08/2018  IgA level and serum light chains improved December 2019  Cycle 3 daratumumab/pomalidomide/Decadron beginning 07/06/2018  Cycle for daratumumab/pomalidomide/Decadron 08/03/2018  2.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 2.4 cm solid right thyroid mass, follow-up ultrasound recommended  Biopsy 01/14/2018-atypia of undetermined significance or follicular lesion of undetermined significance  Affirmatesting- likely a benign lesion  6.Right rib fractures 12/21/2017  7.  Cough/dyspnea- potentially related to toxicity from daratumumab, daily Singulair started 07/13/2018      Disposition: Ms. Wann appears stable.  She will begin another cycle of daratumumab/pomalidomide/Decadron today.  She will return for an office visit and daratumumab in 2 weeks.  We will  check a myeloma panel when she returns in 2 weeks.  She is scheduled for follow-up at [facility] next week.   Betsy Coder, MD  08/03/2018  11:57 AM

## 2018-08-03 NOTE — Patient Instructions (Signed)
[facility] Discharge Instructions for Patients Receiving Chemotherapy  Today you received the following chemotherapy agents: Darzalex  To help prevent nausea and vomiting after your treatment, we encourage you to take your nausea medication as directed.    If you develop nausea and vomiting that is not controlled by your nausea medication, call the clinic.   BELOW ARE SYMPTOMS THAT SHOULD BE REPORTED IMMEDIATELY:  *FEVER GREATER THAN 100.5 F  *CHILLS WITH OR WITHOUT FEVER  NAUSEA AND VOMITING THAT IS NOT CONTROLLED WITH YOUR NAUSEA MEDICATION  *UNUSUAL SHORTNESS OF BREATH  *UNUSUAL BRUISING OR BLEEDING  TENDERNESS IN MOUTH AND THROAT WITH OR WITHOUT PRESENCE OF ULCERS  *URINARY PROBLEMS  *BOWEL PROBLEMS  UNUSUAL RASH Items with * indicate a potential emergency and should be followed up as soon as possible.  Feel free to call the clinic should you have any questions or concerns. The clinic phone number is [phone].  Please show the CHEMO ALERT CARD at check-in to the Emergency Department and triage nurse.   

## 2018-08-04 ENCOUNTER — Telehealth: Payer: Self-pay | Admitting: Oncology

## 2018-08-04 NOTE — Telephone Encounter (Signed)
Scheduled appt per 2/4 los - pt to get an updated schedule next visit;   

## 2018-08-06 ENCOUNTER — Telehealth: Payer: Self-pay | Admitting: Pulmonary Disease

## 2018-08-09 ENCOUNTER — Telehealth: Payer: Self-pay

## 2018-08-09 DIAGNOSIS — Z79899 Other long term (current) drug therapy: Secondary | ICD-10-CM | POA: Diagnosis not present

## 2018-08-09 DIAGNOSIS — R0600 Dyspnea, unspecified: Secondary | ICD-10-CM | POA: Diagnosis not present

## 2018-08-09 DIAGNOSIS — C9000 Multiple myeloma not having achieved remission: Secondary | ICD-10-CM | POA: Diagnosis not present

## 2018-08-09 NOTE — Telephone Encounter (Signed)
Error opening  

## 2018-08-10 ENCOUNTER — Telehealth: Payer: Self-pay | Admitting: *Deleted

## 2018-08-10 ENCOUNTER — Telehealth: Payer: Self-pay

## 2018-08-10 NOTE — Telephone Encounter (Signed)
Was seen at [facility] yesterday and Dr. Rodriguez wants a bone marrow biopsy next week or early the week of 2/24. Patient prefers it be done in [place]. Dr. Sherrill notified and will place order. 

## 2018-08-10 NOTE — Telephone Encounter (Signed)
Poke with patient and r/s her port on 2/17. Per 2/6 phone msg return call

## 2018-08-10 NOTE — Telephone Encounter (Signed)
Patient called again for bone marrow biospy appointment. Attempted to call her back to inform her MD is aware and he needs to order the procedure and has not yet had time to do so. No answer and her mailbox was full.

## 2018-08-11 ENCOUNTER — Ambulatory Visit: Payer: Self-pay | Admitting: Pulmonary Disease

## 2018-08-11 ENCOUNTER — Other Ambulatory Visit: Payer: Self-pay | Admitting: Nurse Practitioner

## 2018-08-11 ENCOUNTER — Telehealth: Payer: Self-pay | Admitting: *Deleted

## 2018-08-11 DIAGNOSIS — C9000 Multiple myeloma not having achieved remission: Secondary | ICD-10-CM

## 2018-08-11 NOTE — Telephone Encounter (Signed)
Error

## 2018-08-11 NOTE — Telephone Encounter (Signed)
Called patient and provided the phone # for central scheduling to set up her bone marrow biopsy since orders are now in. Instructed her to ask for Vivien Rota when she calls them.

## 2018-08-12 ENCOUNTER — Telehealth: Payer: Self-pay | Admitting: *Deleted

## 2018-08-12 NOTE — Telephone Encounter (Signed)
TC from patient regarding refills for her albuterol inhaler. TCT [employer]. Per pharmacy tech- prescription will be available for pick up tomorrow, 08/13/18. Informed patient of this. No other questions or concerns

## 2018-08-15 ENCOUNTER — Other Ambulatory Visit: Payer: Self-pay | Admitting: Oncology

## 2018-08-16 ENCOUNTER — Inpatient Hospital Stay: Payer: PPO

## 2018-08-16 ENCOUNTER — Other Ambulatory Visit: Payer: Self-pay | Admitting: Oncology

## 2018-08-16 ENCOUNTER — Ambulatory Visit
Admission: RE | Admit: 2018-08-16 | Discharge: 2018-08-16 | Disposition: A | Discharge status: Home / Self Care | Payer: PPO | Source: Ambulatory Visit | Attending: Physical Medicine and Rehabilitation | Admitting: Physical Medicine and Rehabilitation

## 2018-08-16 DIAGNOSIS — M4807 Spinal stenosis, lumbosacral region: Secondary | ICD-10-CM

## 2018-08-16 DIAGNOSIS — C9000 Multiple myeloma not having achieved remission: Secondary | ICD-10-CM

## 2018-08-16 DIAGNOSIS — M545 Low back pain, unspecified: Secondary | ICD-10-CM

## 2018-08-16 DIAGNOSIS — M5116 Intervertebral disc disorders with radiculopathy, lumbar region: Secondary | ICD-10-CM | POA: Diagnosis not present

## 2018-08-16 DIAGNOSIS — M48061 Spinal stenosis, lumbar region without neurogenic claudication: Secondary | ICD-10-CM | POA: Diagnosis not present

## 2018-08-16 MED ORDER — SODIUM CHLORIDE 0.9% FLUSH
10.0000 mL | Freq: Once | INTRAVENOUS | Status: DC | PRN
  Filled 2018-08-16: qty 10

## 2018-08-16 MED ORDER — HEPARIN SOD (PORK) LOCK FLUSH 100 UNIT/ML IV SOLN
500.0000 [IU] | Freq: Once | INTRAVENOUS | Status: DC | PRN
  Filled 2018-08-16: qty 5

## 2018-08-16 MED ORDER — GADOBENATE DIMEGLUMINE 529 MG/ML IV SOLN
20.0000 mL | Freq: Once | INTRAVENOUS | Status: AC | PRN
  Administered 2018-08-16: 20 mL via INTRAVENOUS

## 2018-08-16 NOTE — Progress Notes (Signed)
Pt flush apt for today is for pt to have access for MRI.  Unfortunately pt can not have port needle accessed for MRI due to the metal in it.  Pt notified and apt canceled.

## 2018-08-17 ENCOUNTER — Inpatient Hospital Stay: Payer: PPO

## 2018-08-17 ENCOUNTER — Inpatient Hospital Stay (HOSPITAL_BASED_OUTPATIENT_CLINIC_OR_DEPARTMENT_OTHER): Payer: PPO | Admitting: Nurse Practitioner

## 2018-08-17 ENCOUNTER — Other Ambulatory Visit: Payer: Self-pay | Admitting: Radiology

## 2018-08-17 ENCOUNTER — Telehealth: Payer: Self-pay | Admitting: Nurse Practitioner

## 2018-08-17 ENCOUNTER — Encounter: Payer: Self-pay | Admitting: Nurse Practitioner

## 2018-08-17 VITALS — BP 127/70 | HR 92 | Temp 98.4°F | Resp 18 | Wt 187.6 lb

## 2018-08-17 VITALS — BP 123/74 | HR 83 | Resp 16

## 2018-08-17 DIAGNOSIS — Z95828 Presence of other vascular implants and grafts: Secondary | ICD-10-CM

## 2018-08-17 DIAGNOSIS — Z853 Personal history of malignant neoplasm of breast: Secondary | ICD-10-CM | POA: Diagnosis not present

## 2018-08-17 DIAGNOSIS — Z79899 Other long term (current) drug therapy: Secondary | ICD-10-CM | POA: Diagnosis not present

## 2018-08-17 DIAGNOSIS — D649 Anemia, unspecified: Secondary | ICD-10-CM

## 2018-08-17 DIAGNOSIS — Z5112 Encounter for antineoplastic immunotherapy: Secondary | ICD-10-CM | POA: Diagnosis not present

## 2018-08-17 DIAGNOSIS — C9000 Multiple myeloma not having achieved remission: Secondary | ICD-10-CM

## 2018-08-17 LAB — CBC WITH DIFFERENTIAL (CANCER CENTER ONLY)
Abs Immature Granulocytes: 0.01 10*3/uL (ref 0.00–0.07)
Basophils Absolute: 0.1 10*3/uL (ref 0.0–0.1)
Basophils Relative: 2 %
Eosinophils Absolute: 0.1 10*3/uL (ref 0.0–0.5)
Eosinophils Relative: 2 %
HCT: 39.1 % (ref 36.0–46.0)
Hemoglobin: 11.6 g/dL — ABNORMAL LOW (ref 12.0–15.0)
Immature Granulocytes: 0 %
Lymphocytes Relative: 21 %
Lymphs Abs: 1.1 10*3/uL (ref 0.7–4.0)
MCH: 23.8 pg — ABNORMAL LOW (ref 26.0–34.0)
MCHC: 29.7 g/dL — ABNORMAL LOW (ref 30.0–36.0)
MCV: 80.1 fL (ref 80.0–100.0)
Monocytes Absolute: 0.4 10*3/uL (ref 0.1–1.0)
Monocytes Relative: 8 %
NEUTROS ABS: 3.5 10*3/uL (ref 1.7–7.7)
NEUTROS PCT: 67 %
Platelet Count: 190 10*3/uL (ref 150–400)
RBC: 4.88 MIL/uL (ref 3.87–5.11)
RDW: 22.9 % — ABNORMAL HIGH (ref 11.5–15.5)
WBC Count: 5.1 10*3/uL (ref 4.0–10.5)
nRBC: 0 % (ref 0.0–0.2)

## 2018-08-17 LAB — CMP (CANCER CENTER ONLY)
ALT: 50 U/L — ABNORMAL HIGH (ref 0–44)
AST: 46 U/L — ABNORMAL HIGH (ref 15–41)
Albumin: 3.4 g/dL — ABNORMAL LOW (ref 3.5–5.0)
Alkaline Phosphatase: 55 U/L (ref 38–126)
Anion gap: 9 (ref 5–15)
BUN: 12 mg/dL (ref 8–23)
CO2: 25 mmol/L (ref 22–32)
Calcium: 8.7 mg/dL — ABNORMAL LOW (ref 8.9–10.3)
Chloride: 104 mmol/L (ref 98–111)
Creatinine: 0.68 mg/dL (ref 0.44–1.00)
GFR, Est AFR Am: >60 mL/min (ref >60)
GFR, Estimated: >60 mL/min (ref >60)
GLUCOSE: 107 mg/dL — AB (ref 70–99)
Potassium: 4.2 mmol/L (ref 3.5–5.1)
SODIUM: 138 mmol/L (ref 135–145)
Total Bilirubin: 0.4 mg/dL (ref 0.3–1.2)
Total Protein: 5.9 g/dL — ABNORMAL LOW (ref 6.5–8.1)

## 2018-08-17 MED ORDER — ACETAMINOPHEN 325 MG PO TABS
ORAL_TABLET | ORAL | Status: AC
  Filled 2018-08-17: qty 2

## 2018-08-17 MED ORDER — MONTELUKAST SODIUM 10 MG PO TABS: 10.0000 mg | ORAL_TABLET | Freq: Once | ORAL | Status: DC

## 2018-08-17 MED ORDER — SODIUM CHLORIDE 0.9% FLUSH
10.0000 mL | INTRAVENOUS | Status: DC | PRN
  Administered 2018-08-17: 10 mL via INTRAVENOUS
  Filled 2018-08-17: qty 10

## 2018-08-17 MED ORDER — HEPARIN SOD (PORK) LOCK FLUSH 100 UNIT/ML IV SOLN
500.0000 [IU] | Freq: Once | INTRAVENOUS | Status: AC | PRN
  Administered 2018-08-17: 500 [IU]
  Filled 2018-08-17: qty 5

## 2018-08-17 MED ORDER — DIPHENHYDRAMINE HCL 25 MG PO CAPS
ORAL_CAPSULE | ORAL | Status: AC
  Filled 2018-08-17: qty 1

## 2018-08-17 MED ORDER — DIPHENHYDRAMINE HCL 25 MG PO CAPS
50.0000 mg | ORAL_CAPSULE | Freq: Once | ORAL | Status: AC
  Administered 2018-08-17: 50 mg via ORAL

## 2018-08-17 MED ORDER — ACETAMINOPHEN 325 MG PO TABS
650.0000 mg | ORAL_TABLET | Freq: Once | ORAL | Status: AC
  Administered 2018-08-17: 650 mg via ORAL

## 2018-08-17 MED ORDER — FAMOTIDINE IN NACL 20-0.9 MG/50ML-% IV SOLN
20.0000 mg | Freq: Once | INTRAVENOUS | Status: AC
  Administered 2018-08-17: 20 mg via INTRAVENOUS

## 2018-08-17 MED ORDER — SODIUM CHLORIDE 0.9% FLUSH
10.0000 mL | INTRAVENOUS | Status: DC | PRN
  Administered 2018-08-17: 10 mL
  Filled 2018-08-17: qty 10

## 2018-08-17 MED ORDER — SODIUM CHLORIDE 0.9 % IV SOLN
20.0000 mg | Freq: Once | INTRAVENOUS | Status: AC
  Administered 2018-08-17: 20 mg via INTRAVENOUS
  Filled 2018-08-17: qty 20

## 2018-08-17 MED ORDER — MONTELUKAST SODIUM 10 MG PO TABS
ORAL_TABLET | ORAL | Status: AC
  Filled 2018-08-17: qty 1

## 2018-08-17 MED ORDER — SODIUM CHLORIDE 0.9 % IV SOLN
Freq: Once | INTRAVENOUS | Status: AC
  Administered 2018-08-17: 11:00:00 via INTRAVENOUS
  Filled 2018-08-17: qty 250

## 2018-08-17 MED ORDER — FAMOTIDINE IN NACL 20-0.9 MG/50ML-% IV SOLN
INTRAVENOUS | Status: AC
  Filled 2018-08-17: qty 50

## 2018-08-17 MED ORDER — SODIUM CHLORIDE 0.9 % IV SOLN
16.2000 mg/kg | Freq: Once | INTRAVENOUS | Status: AC
  Administered 2018-08-17: 1300 mg via INTRAVENOUS
  Filled 2018-08-17: qty 60

## 2018-08-17 NOTE — Patient Instructions (Signed)
[facility] Discharge Instructions for Patients Receiving Chemotherapy  Today you received the following chemotherapy agents: Darzalex  To help prevent nausea and vomiting after your treatment, we encourage you to take your nausea medication as directed.    If you develop nausea and vomiting that is not controlled by your nausea medication, call the clinic.   BELOW ARE SYMPTOMS THAT SHOULD BE REPORTED IMMEDIATELY:  *FEVER GREATER THAN 100.5 F  *CHILLS WITH OR WITHOUT FEVER  NAUSEA AND VOMITING THAT IS NOT CONTROLLED WITH YOUR NAUSEA MEDICATION  *UNUSUAL SHORTNESS OF BREATH  *UNUSUAL BRUISING OR BLEEDING  TENDERNESS IN MOUTH AND THROAT WITH OR WITHOUT PRESENCE OF ULCERS  *URINARY PROBLEMS  *BOWEL PROBLEMS  UNUSUAL RASH Items with * indicate a potential emergency and should be followed up as soon as possible.  Feel free to call the clinic should you have any questions or concerns. The clinic phone number is [phone].  Please show the CHEMO ALERT CARD at check-in to the Emergency Department and triage nurse.   

## 2018-08-17 NOTE — Telephone Encounter (Signed)
No 2/18 los.

## 2018-08-17 NOTE — Progress Notes (Signed)
[facility] OFFICE PROGRESS NOTE   Diagnosis: Multiple myeloma  INTERVAL HISTORY:   [name] returns as scheduled.  She began cycle 4 daratumumab/pomalidomide/dexamethasone 08/03/2018.  She denies nausea/vomiting.  She had a single tiny mouth sore a few weeks ago which has resolved.  No significant issues with diarrhea.  She has an occasional cough which is better.  No shortness of breath.  No fever.  Objective:  Vital signs in last 24 hours:  Blood pressure 127/70, pulse 92, temperature 98.4 F (36.9 C), temperature source Oral, resp. rate 18, weight 187 lb 9.6 oz (85.1 kg), SpO2 98 %.    HEENT: No thrush or ulcers. Resp: Lungs clear bilaterally. Cardio: Regular rate and rhythm. GI: Abdomen soft and nontender.  No hepatomegaly. Vascular: No leg edema.  Left lower leg is larger than the right lower leg. Port-A-Cath without erythema.   Lab Results:  Lab Results  Component Value Date   WBC 5.1 08/17/2018   HGB 11.6 (L) 08/17/2018   HCT 39.1 08/17/2018   MCV 80.1 08/17/2018   PLT 190 08/17/2018   NEUTROABS 3.5 08/17/2018    Imaging:  Mr Lumbar Spine W Wo Contrast  Result Date: 08/16/2018 CLINICAL DATA:  Low back pain. History of myeloma and breast cancer. EXAM: MRI LUMBAR SPINE WITHOUT AND WITH CONTRAST TECHNIQUE: Multiplanar and multiecho pulse sequences of the lumbar spine were obtained without and with intravenous contrast. CONTRAST:  33m MULTIHANCE GADOBENATE DIMEGLUMINE 529 MG/ML IV SOLN COMPARISON:  PET CT 10/16/2017 FINDINGS: Segmentation: Normal. The lowest disc space is considered to be L5-S1. Alignment:  Left convex scoliosis with apex at L2. Vertebrae: No acute compression fracture, discitis-osteomyelitis of focal marrow lesion. No abnormal contrast enhancement. Conus medullaris and cauda equina: The conus medullaris terminates at the L1 level. The cauda equina and conus medullaris are both normal. No abnormal contrast enhancement. Paraspinal and  other soft tissues: The visualized retroperitoneal organs and paraspinal soft tissues are normal. Disc levels: Sagittal plane imaging includes the T10-11 disc level through the upper sacrum, with axial imaging of the T11-12 to L5-S1 disc levels. T11-12: Right subarticular disc protrusion without spinal canal stenosis. No neural impingement. T12-L1: Small disc bulge without spinal canal stenosis. Mild bilateral foraminal narrowing. L1-2: Disc space narrowing and desiccation with small endplate osteophytes. Moderate right foraminal stenosis. No spinal canal stenosis. L2-3: Small disc bulge with endplate osteophytes. Moderate right foraminal stenosis, partially due to scoliosis. No spinal canal stenosis. L3-4: Disc space narrowing without herniation. No spinal canal or neural foraminal stenosis. L4-5: Disc desiccation with left subarticular/foraminal disc protrusion narrowing the left lateral recess and displacing the descending left L5 nerve root. No central spinal canal stenosis. No neural impingement within the exit foramina. L5-S1: Disc space narrowing with left foraminal disc osteophyte complex causing mild foraminal stenosis. No right foraminal stenosis. No central spinal canal stenosis. The visualized portion of the sacrum is normal. IMPRESSION: 1. No focal marrow lesions of the lumbar spine to suggest metastatic disease. 2. L4-5 left lateral recess stenosis with displacement of the descending left L5 nerve root. Correlate for left L5 radiculopathy. 3. Moderate right neural foraminal stenosis at L1-2 and L2-3, largely due to scoliotic curvature. Electronically Signed   By: KUlyses JarredM.D.   On: 08/16/2018 15:24    Medications: I have reviewed the patient's current medications.  Assessment/Plan:  1. Multiple myeloma, IgA kappa diagnosed on bone marrow biopsy 09/22/2017  57% plasma cells on initial bone marrow biopsy, kappa light chain restricted  Normal cytogenetics, FISH with -4, -14, and  13q-abnormalities  PET scan 10/16/2017-negative for bone lesions  Cycle 1 RVD 10/23/2017  Cycle 2 RVD 11/13/2017  Cycle 3 RVD 12/04/2017 (Revlimid dose reduced to 15 mg)  Cycle 4 RVD 12/29/2017  Cycle 5 RVD 01/26/2018  Cycle 1 carfilzomib/Cytoxan/Decadron 03/02/2018 (day 16 treatment held secondary to patient preference)  Cycle2carfilzomib/Cytoxan/Decadron 03/30/2018  Improvement, but persistent elevation of the kappa/lambda ratio, transplant team recommended changing treatment to daratumumab/pomalidomide/Decadron  Cycle 1 daratumumab/pomalidomide/Decadron beginning 05/11/2018 (day 22 daratumumab held secondary to an upper respiratory infection)  Cycle 2 daratumumab/pomalidomide/Decadron beginning 06/08/2018  IgA level and serum light chains improved December 2019  Cycle 3 daratumumab/pomalidomide/Decadron beginning 07/06/2018  Cycle 4 daratumumab/pomalidomide/Decadron 08/03/2018  2.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 2.4 cm solid right thyroid mass, follow-up ultrasound recommended  Biopsy 01/14/2018-atypia of undetermined significance or follicular lesion of undetermined significance  Affirmatesting- likely a benign lesion  6.Right rib fractures 12/21/2017  7.  Cough/dyspnea- potentially related to toxicity from daratumumab, daily Singulair started 07/13/2018     Disposition: [name] appears stable.  Plan to proceed with daratumumab today as scheduled.  She is completing a cycle of pomalidomide.  We will follow-up on the myeloma panel from today.  She is scheduled for a restaging bone marrow biopsy 08/18/2018.  She will return for lab, follow-up and daratumumab on 08/31/2018.  She will  contact the office in the interim with any problems.    Ned Card ANP/GNP-BC   08/17/2018  10:57 AM

## 2018-08-18 ENCOUNTER — Encounter (HOSPITAL_COMMUNITY): Payer: Self-pay

## 2018-08-18 ENCOUNTER — Ambulatory Visit (HOSPITAL_COMMUNITY)
Admission: RE | Admit: 2018-08-18 | Discharge: 2018-08-18 | Disposition: A | Discharge status: Home / Self Care | Payer: PPO | Source: Ambulatory Visit | Attending: Nurse Practitioner | Admitting: Nurse Practitioner

## 2018-08-18 ENCOUNTER — Other Ambulatory Visit: Payer: Self-pay

## 2018-08-18 DIAGNOSIS — Z7982 Long term (current) use of aspirin: Secondary | ICD-10-CM | POA: Diagnosis not present

## 2018-08-18 DIAGNOSIS — Z853 Personal history of malignant neoplasm of breast: Secondary | ICD-10-CM | POA: Insufficient documentation

## 2018-08-18 DIAGNOSIS — C9000 Multiple myeloma not having achieved remission: Secondary | ICD-10-CM | POA: Diagnosis not present

## 2018-08-18 DIAGNOSIS — K219 Gastro-esophageal reflux disease without esophagitis: Secondary | ICD-10-CM | POA: Diagnosis not present

## 2018-08-18 DIAGNOSIS — I10 Essential (primary) hypertension: Secondary | ICD-10-CM | POA: Insufficient documentation

## 2018-08-18 DIAGNOSIS — M8580 Other specified disorders of bone density and structure, unspecified site: Secondary | ICD-10-CM | POA: Diagnosis not present

## 2018-08-18 DIAGNOSIS — D509 Iron deficiency anemia, unspecified: Secondary | ICD-10-CM | POA: Insufficient documentation

## 2018-08-18 DIAGNOSIS — Z79899 Other long term (current) drug therapy: Secondary | ICD-10-CM | POA: Diagnosis not present

## 2018-08-18 DIAGNOSIS — M797 Fibromyalgia: Secondary | ICD-10-CM | POA: Diagnosis not present

## 2018-08-18 DIAGNOSIS — B1920 Unspecified viral hepatitis C without hepatic coma: Secondary | ICD-10-CM | POA: Diagnosis not present

## 2018-08-18 DIAGNOSIS — E119 Type 2 diabetes mellitus without complications: Secondary | ICD-10-CM | POA: Insufficient documentation

## 2018-08-18 LAB — CBC WITH DIFFERENTIAL/PLATELET
Abs Immature Granulocytes: 0.04 10*3/uL (ref 0.00–0.07)
Basophils Absolute: 0 10*3/uL (ref 0.0–0.1)
Basophils Relative: 0 %
Eosinophils Absolute: 0 10*3/uL (ref 0.0–0.5)
Eosinophils Relative: 0 %
HEMATOCRIT: 37.1 % (ref 36.0–46.0)
Hemoglobin: 10.8 g/dL — ABNORMAL LOW (ref 12.0–15.0)
Immature Granulocytes: 1 %
LYMPHS ABS: 1.1 10*3/uL (ref 0.7–4.0)
LYMPHS PCT: 14 %
MCH: 23.9 pg — ABNORMAL LOW (ref 26.0–34.0)
MCHC: 29.1 g/dL — ABNORMAL LOW (ref 30.0–36.0)
MCV: 82.1 fL (ref 80.0–100.0)
MONOS PCT: 6 %
Monocytes Absolute: 0.5 10*3/uL (ref 0.1–1.0)
Neutro Abs: 5.9 10*3/uL (ref 1.7–7.7)
Neutrophils Relative %: 79 %
Platelets: 209 10*3/uL (ref 150–400)
RBC: 4.52 MIL/uL (ref 3.87–5.11)
RDW: 22.7 % — ABNORMAL HIGH (ref 11.5–15.5)
WBC: 7.5 10*3/uL (ref 4.0–10.5)
nRBC: 0 % (ref 0.0–0.2)

## 2018-08-18 LAB — PROTEIN ELECTROPHORESIS, SERUM
A/G Ratio: 1.5 (ref 0.7–1.7)
Albumin ELP: 3.4 g/dL (ref 2.9–4.4)
Alpha-1-Globulin: 0.3 g/dL (ref 0.0–0.4)
Alpha-2-Globulin: 0.9 g/dL (ref 0.4–1.0)
Beta Globulin: 0.9 g/dL (ref 0.7–1.3)
Gamma Globulin: 0.3 g/dL — ABNORMAL LOW (ref 0.4–1.8)
Globulin, Total: 2.3 g/dL (ref 2.2–3.9)
M-Spike, %: 0.1 g/dL — ABNORMAL HIGH
Total Protein ELP: 5.7 g/dL — ABNORMAL LOW (ref 6.0–8.5)

## 2018-08-18 LAB — KAPPA/LAMBDA LIGHT CHAINS
Kappa free light chain: 10.5 mg/L (ref 3.3–19.4)
Kappa, lambda light chain ratio: 2.84 — ABNORMAL HIGH (ref 0.26–1.65)
Lambda free light chains: 3.7 mg/L — ABNORMAL LOW (ref 5.7–26.3)

## 2018-08-18 LAB — PROTIME-INR
INR: 0.99
Prothrombin Time: 13 seconds (ref 11.4–15.2)

## 2018-08-18 LAB — IGA: IGA: 17 mg/dL — AB (ref 87–352)

## 2018-08-18 MED ORDER — FENTANYL CITRATE (PF) 100 MCG/2ML IJ SOLN
INTRAMUSCULAR | Status: AC | PRN
  Administered 2018-08-18 (×2): 50 ug via INTRAVENOUS

## 2018-08-18 MED ORDER — FENTANYL CITRATE (PF) 100 MCG/2ML IJ SOLN
INTRAMUSCULAR | Status: AC
  Filled 2018-08-18: qty 2

## 2018-08-18 MED ORDER — MIDAZOLAM HCL 2 MG/2ML IJ SOLN
INTRAMUSCULAR | Status: AC | PRN
  Administered 2018-08-18 (×2): 1 mg via INTRAVENOUS

## 2018-08-18 MED ORDER — MIDAZOLAM HCL 2 MG/2ML IJ SOLN
INTRAMUSCULAR | Status: AC
  Filled 2018-08-18: qty 4

## 2018-08-18 MED ORDER — LIDOCAINE HCL (PF) 1 % IJ SOLN
INTRAMUSCULAR | Status: AC | PRN
  Administered 2018-08-18: 10 mL

## 2018-08-18 MED ORDER — SODIUM CHLORIDE 0.9 % IV SOLN
INTRAVENOUS | Status: DC
  Administered 2018-08-18: 08:00:00 via INTRAVENOUS

## 2018-08-18 MED ORDER — HYDROCODONE-ACETAMINOPHEN 5-325 MG PO TABS: 1.0000 | ORAL_TABLET | ORAL | Status: DC | PRN

## 2018-08-18 MED ORDER — HEPARIN SOD (PORK) LOCK FLUSH 100 UNIT/ML IV SOLN
500.0000 [IU] | INTRAVENOUS | Status: AC | PRN
  Administered 2018-08-18: 500 [IU]
  Filled 2018-08-18: qty 5

## 2018-08-18 NOTE — Discharge Instructions (Signed)
Moderate Conscious Sedation, Adult, Care After These instructions provide you with information about caring for yourself after your procedure. Your health care provider may also give you more specific instructions. Your treatment has been planned according to current medical practices, but problems sometimes occur. Call your health care provider if you have any problems or questions after your procedure. What can I expect after the procedure? After your procedure, it is common:  To feel sleepy for several hours.  To feel clumsy and have poor balance for several hours.  To have poor judgment for several hours.  To vomit if you eat too soon. Follow these instructions at home: For at least 24 hours after the procedure:   Do not: ? Participate in activities where you could fall or become injured. ? Drive. ? Use heavy machinery. ? Drink alcohol. ? Take sleeping pills or medicines that cause drowsiness. ? Make important decisions or sign legal documents. ? Take care of children on your own.  Rest. Eating and drinking  Follow the diet recommended by your health care provider.  If you vomit: ? Drink water, juice, or soup when you can drink without vomiting. ? Make sure you have little or no nausea before eating solid foods. General instructions  Have a responsible adult stay with you until you are awake and alert.  Take over-the-counter and prescription medicines only as told by your health care provider.  If you smoke, do not smoke without supervision.  Keep all follow-up visits as told by your health care provider. This is important. Contact a health care provider if:  You keep feeling nauseous or you keep vomiting.  You feel light-headed.  You develop a rash.  You have a fever. Get help right away if:  You have trouble breathing. This information is not intended to replace advice given to you by your health care provider. Make sure you discuss any questions you have  with your health care provider. Document Released: 04/06/2013 Document Revised: 11/19/2015 Document Reviewed: 10/06/2015 Elsevier Interactive Patient Education  2019 [place] may shower and remove your dressing tomorrow.  Bone Marrow Aspiration and Bone Marrow Biopsy, Adult, Care After This sheet gives you information about how to care for yourself after your procedure. Your health care provider may also give you more specific instructions. If you have problems or questions, contact your health care provider. What can I expect after the procedure? After the procedure, it is common to have:  Mild pain and tenderness.  Swelling.  Bruising. Follow these instructions at home: Puncture site care      Follow instructions from your health care provider about how to take care of the puncture site. Make sure you: ? Wash your hands with soap and water before you change your bandage (dressing). If soap and water are not available, use hand sanitizer. ? Change your dressing as told by your health care provider.  Check your puncture siteevery day for signs of infection. Check for: ? More redness, swelling, or pain. ? More fluid or blood. ? Warmth. ? Pus or a bad smell. General instructions  Take over-the-counter and prescription medicines only as told by your health care provider.  Do not take baths, swim, or use a hot tub until your health care provider approves. Ask if you can take a shower or have a sponge bath.  Return to your normal activities as told by your health care provider. Ask your health care provider what activities are safe for  you.  Do not drive for 24 hours if you were given a medicine to help you relax (sedative) during your procedure.  Keep all follow-up visits as told by your health care provider. This is important. Contact a health care provider if:  Your pain is not controlled with medicine. Get help right away if:  You have a fever.  You  have more redness, swelling, or pain around the puncture site.  You have more fluid or blood coming from the puncture site.  Your puncture site feels warm to the touch.  You have pus or a bad smell coming from the puncture site. These symptoms may represent a serious problem that is an emergency. Do not wait to see if the symptoms will go away. Get medical help right away. Call your local emergency services (911 in the [place] Do not drive yourself to the hospital. Summary  After the procedure, it is common to have mild pain, tenderness, swelling, and bruising.  Follow instructions from your health care provider about how to take care of the puncture site.  Get help right away if you have any symptoms of infection or if you have more blood or fluid coming from the puncture site. This information is not intended to replace advice given to you by your health care provider. Make sure you discuss any questions you have with your health care provider. Document Released: 01/03/2005 Document Revised: 09/29/2017 Document Reviewed: 11/28/2015 Elsevier Interactive Patient Education  2019 [employer].

## 2018-08-18 NOTE — Procedures (Signed)
Interventional Radiology Procedure:   Indications: Multiple myeloma  Procedure: CT guided bone marrow biopsy  Findings: 2 aspirates and 1 core from right ilium  Complications: None     EBL: Minimal  Plan: Bedrest 1 hour.     Inioluwa Baris R. Anselm Pancoast, MD  Pager: [phone]

## 2018-08-18 NOTE — Progress Notes (Signed)
RN spoke with Wille Glaser confirming he will transport patient home after IR procedure.

## 2018-08-18 NOTE — H&P (Signed)
Referring Physician(s): Sherrill,B  Supervising Physician: Markus Daft  Patient Status:  [facility]   Chief Complaint:  "I'm here for a bone marrow biopsy"   Subjective: Patient familiar to IR service from prior random liver biopsy in 2007, bone marrow biopsy on 09/22/2017 and Port-A-Cath placement on 02/26/2018.  She has a remote history of breast cancer, hepatitis C and multiple myeloma.  She presents again today for restaging CT-guided bone marrow biopsy.  She currently denies fever, headache, chest pain, dyspnea, cough, abdominal/back pain, nausea, vomiting or bleeding.  Past Medical History:  Diagnosis Date  . Anemia    2016 on iron for a while   . Anxiety   . ARTHRITIS 10/25/2007  . Arthritis   . Blood transfusion without reported diagnosis   . BREAST CANCER, HX OF 08/03/2006  . Cancer ([place])    hx of breast ca x 2 on right   . DEPRESSION 08/03/2006  . Diabetes ([place])    prediabetic   . DIABETES MELLITUS, TYPE II 08/03/2006  . DYSPNEA ON EXERTION 11/21/2009   shortness of breath with exertion   . EPISTAXIS, RECURRENT 01/27/2008  . FIBROMYALGIA 01/13/2008  . GERD 04/30/2008  . HEPATITIS C 08/03/2006  . HIP PAIN, RIGHT 04/30/2007  . History of detached retina repair 02/12/2011  . HYPERTENSION 04/30/2008  . INSOMNIA-SLEEP DISORDER-UNSPEC 11/21/2009  . KNEE SPRAIN, ACUTE 01/27/2008  . LEG PAIN, LEFT 01/13/2008  . OSTEOPENIA 07/26/2007  . Persistent vomiting 08/17/2008  . Pneumonia    hx of   . Prediabetes   . Thyroid nodule    Past Surgical History:  Procedure Laterality Date  . ABDOMINAL HYSTERECTOMY    . BREAST LUMPECTOMY  2000   right with axillary LND  . CHOLECYSTECTOMY  06/05/2012   Procedure: LAPAROSCOPIC CHOLECYSTECTOMY;  Surgeon: Adin Hector, MD;  Location: [facility];  Service: General;  Laterality: N/A;  . IR IMAGING GUIDED PORT INSERTION  02/26/2018  . LAPAROSCOPIC LYSIS OF ADHESIONS  06/05/2012   Procedure: LAPAROSCOPIC LYSIS OF ADHESIONS;  Surgeon: Adin Hector, MD;   Location: [facility];  Service: General;  Laterality: N/A;  . MASTECTOMY  2007   Dr. Margot Chimes  . s/p right broken leg with surgury as teen    . s/p thyroid FNA neg.    . TONSILLECTOMY    . TOTAL HIP ARTHROPLASTY Right 01/25/2016   Procedure: RIGHT TOTAL HIP ARTHROPLASTY ANTERIOR APPROACH;  Surgeon: Mcarthur Rossetti, MD;  Location: [facility];  Service: Orthopedics;  Laterality: Right;  . TUBAL LIGATION        Allergies: Ace inhibitors; Venlafaxine; Iohexol; and Tape  Medications: Prior to Admission medications   Medication Sig Start Date End Date Taking? Authorizing Provider  albuterol (PROVENTIL HFA;VENTOLIN HFA) 108 (90 Base) MCG/ACT inhaler Inhale 2 puffs into the lungs every 6 (six) hours as needed for wheezing or shortness of breath. 08/03/18  Yes Ladell Pier, MD  amitriptyline (ELAVIL) 25 MG tablet Take 1 tablet (25 mg total) by mouth at bedtime. 02/16/18  Yes Eksir, Richard Miu, MD  ARIPiprazole (ABILIFY) 2 MG tablet Take 1 tablet (2 mg total) by mouth daily. Start in the morning 02/16/18  Yes Eksir, Richard Miu, MD  aspirin 81 MG tablet Take 162 mg by mouth daily.    Yes [provider]  Calcium Carb-Cholecalciferol (CALCIUM-VITAMIN D3) 500-400 MG-UNIT TABS Take 3 tablets by mouth daily. Total calcium dose 1500 mg/day 07/14/18  Yes Ladell Pier, MD  CANNABIDIOL PO Take 1 Dose  by mouth at bedtime as needed (sleep).   Yes [provider]  cholestyramine (QUESTRAN) 4 GM/DOSE powder Take 1 packet (4 g total) by mouth 2 (two) times daily with a meal. Patient taking differently: Take 4 g by mouth 2 (two) times daily as needed.  04/13/14  Yes Biagio Borg, MD  dexamethasone (DECADRON) 4 MG tablet Take 5 tablets (20 mg) on the day of chemotherapy. 03/02/18  Yes Ladell Pier, MD  diphenhydrAMINE (BENADRYL) 25 MG tablet Take 25 mg by mouth every 6 (six) hours as needed.   Yes [provider]  DULoxetine (CYMBALTA) 30 MG capsule Take 1 capsule (30 mg  total) by mouth daily. Take with 60 mg capsule for a total of 90 mg daily 02/16/18 02/16/19 Yes Eksir, Richard Miu, MD  DULoxetine (CYMBALTA) 60 MG capsule Take 1 capsule (60 mg total) by mouth daily. Take with 30 mg capsule for a total of 90 mg daily 02/16/18 02/16/19 Yes Eksir, Richard Miu, MD  ferrous gluconate (FERGON) 324 MG tablet Take 1 tablet (324 mg total) by mouth 3 (three) times daily with meals. 07/20/18  Yes Ladell Pier, MD  gabapentin (NEURONTIN) 300 MG capsule Take 2 capsules (600 mg total) by mouth 4 (four) times daily. 07/27/17 08/18/18 Yes Eksir, Richard Miu, MD  Melatonin 5 MG TABS Take 5 mg by mouth at bedtime.   Yes [provider]  montelukast (SINGULAIR) 10 MG tablet Take 1 tablet (10 mg total) by mouth at bedtime. 07/13/18  Yes Ladell Pier, MD  Multiple Vitamins-Minerals (MULTIVITAMIN WITH MINERALS) tablet Take 2 tablets by mouth daily. Each tab has 300 mg Ca+   Yes [provider]  omeprazole (PRILOSEC) 20 MG capsule Take 2 capsules (40 mg total) by mouth daily. Patient taking differently: Take 20 mg by mouth daily.  04/13/14  Yes Biagio Borg, MD  oxyCODONE-acetaminophen (PERCOCET) 10-325 MG tablet Take 1 tablet by mouth 5 (five) times daily as needed for pain.    Yes [provider]  POMALYST 3 MG capsule TAKE 1 CAPSULE BY MOUTH  WITH WATER EVERY DAY FOR 21 DAYS, THEN 7 DAYS OFF 08/31/18  Yes Ladell Pier, MD  prochlorperazine (COMPAZINE) 10 MG tablet Take 1 tablet (10 mg total) by mouth every 6 (six) hours as needed for nausea or vomiting. 05/11/18  Yes Ladell Pier, MD  temazepam (RESTORIL) 30 MG capsule Take 1 capsule (30 mg total) by mouth Nightly. 02/16/18  Yes Eksir, Richard Miu, MD  diazepam (VALIUM) 2 MG tablet Take 1 tablet (2 mg total) by mouth daily as needed for anxiety. 04/29/18   Ladell Pier, MD  guaiFENesin-codeine 100-10 MG/5ML syrup Take 5-10 mLs by mouth 3 (three) times daily as needed for cough. Patient  not taking: Reported on 08/17/2018 07/21/18   Owens Shark, NP  lidocaine-prilocaine (EMLA) cream Apply 1 application topically as needed. 02/26/18   Ladell Pier, MD     Vital Signs: BP 134/77 (BP Location: Left Arm)   Pulse 89   Resp 16   SpO2 100%   Physical Exam awake, alert.  Chest clear to auscultation bilaterally.  Clean, intact left chest wall Port-A-Cath.  Heart with regular rate and rhythm.  Abdomen soft, positive bowel sounds, nontender.  No significant lower extremity edema.  Imaging: Mr Lumbar Spine W Wo Contrast  Result Date: 08/16/2018 CLINICAL DATA:  Low back pain. History of myeloma and breast cancer. EXAM: MRI LUMBAR SPINE WITHOUT AND WITH CONTRAST  TECHNIQUE: Multiplanar and multiecho pulse sequences of the lumbar spine were obtained without and with intravenous contrast. CONTRAST:  46m MULTIHANCE GADOBENATE DIMEGLUMINE 529 MG/ML IV SOLN COMPARISON:  PET CT 10/16/2017 FINDINGS: Segmentation: Normal. The lowest disc space is considered to be L5-S1. Alignment:  Left convex scoliosis with apex at L2. Vertebrae: No acute compression fracture, discitis-osteomyelitis of focal marrow lesion. No abnormal contrast enhancement. Conus medullaris and cauda equina: The conus medullaris terminates at the L1 level. The cauda equina and conus medullaris are both normal. No abnormal contrast enhancement. Paraspinal and other soft tissues: The visualized retroperitoneal organs and paraspinal soft tissues are normal. Disc levels: Sagittal plane imaging includes the T10-11 disc level through the upper sacrum, with axial imaging of the T11-12 to L5-S1 disc levels. T11-12: Right subarticular disc protrusion without spinal canal stenosis. No neural impingement. T12-L1: Small disc bulge without spinal canal stenosis. Mild bilateral foraminal narrowing. L1-2: Disc space narrowing and desiccation with small endplate osteophytes. Moderate right foraminal stenosis. No spinal canal stenosis. L2-3: Small  disc bulge with endplate osteophytes. Moderate right foraminal stenosis, partially due to scoliosis. No spinal canal stenosis. L3-4: Disc space narrowing without herniation. No spinal canal or neural foraminal stenosis. L4-5: Disc desiccation with left subarticular/foraminal disc protrusion narrowing the left lateral recess and displacing the descending left L5 nerve root. No central spinal canal stenosis. No neural impingement within the exit foramina. L5-S1: Disc space narrowing with left foraminal disc osteophyte complex causing mild foraminal stenosis. No right foraminal stenosis. No central spinal canal stenosis. The visualized portion of the sacrum is normal. IMPRESSION: 1. No focal marrow lesions of the lumbar spine to suggest metastatic disease. 2. L4-5 left lateral recess stenosis with displacement of the descending left L5 nerve root. Correlate for left L5 radiculopathy. 3. Moderate right neural foraminal stenosis at L1-2 and L2-3, largely due to scoliotic curvature. Electronically Signed   By: KUlyses JarredM.D.   On: 08/16/2018 15:24    Labs:  CBC: Recent Labs    07/29/18 1059 08/03/18 0947 08/17/18 0939 08/18/18 0713  WBC 3.2* 4.6 5.1 7.5  HGB 8.3* 10.2* 11.6* 10.8*  HCT 29.4* 34.9* 39.1 37.1  PLT 239 285 190 209    COAGS: Recent Labs    09/22/17 0912 02/26/18 0819  INR 0.92 0.94  APTT  --  28    BMP: Recent Labs    05/11/18 0739 05/25/18 1155 07/06/18 0855 08/17/18 0939  NA 140 139 139 138  K 4.1 4.6 4.4 4.2  CL 104 107 108 104  CO2 25 24 21* 25  GLUCOSE 160* 110* 104* 107*  BUN 20 12 12 12   CALCIUM 8.7* 8.9 7.9* 8.7*  CREATININE 0.75 0.68 0.73 0.68  GFRNONAA >60 >60 >60 >60  GFRAA >60 >60 >60 >60    LIVER FUNCTION TESTS: Recent Labs    05/11/18 0739 05/25/18 1155 07/06/18 0855 08/17/18 0939  BILITOT 0.4 0.4 0.3 0.4  AST 36 33 25 46*  ALT 36 35 31 50*  ALKPHOS 50 56 56 55  PROT 6.0* 6.0* 5.8* 5.9*  ALBUMIN 3.2* 3.2* 3.2* 3.4*    Assessment  and Plan: Patient with remote history of breast cancer, hepatitis C and multiple myeloma, currently receiving treatment.  She presents today for restaging CT guided bone marrow biopsy for further evaluation.Risks and benefits of procedure was discussed with the patient including, but not limited to bleeding, infection, damage to adjacent structures or low yield requiring additional tests.  All of the questions were  answered and there is agreement to proceed.  Consent signed and in chart.     Electronically Signed: D. Rowe Robert, PA-C 08/18/2018, 8:14 AM   I spent a total of 20 minutes at the the patient's bedside AND on the patient's hospital floor or unit, greater than 50% of which was counseling/coordinating care for CT-guided bone marrow biopsy

## 2018-08-19 ENCOUNTER — Ambulatory Visit: Payer: Self-pay | Admitting: Pulmonary Disease

## 2018-08-23 ENCOUNTER — Telehealth: Payer: Self-pay | Admitting: *Deleted

## 2018-08-23 DIAGNOSIS — Z853 Personal history of malignant neoplasm of breast: Secondary | ICD-10-CM | POA: Diagnosis not present

## 2018-08-23 DIAGNOSIS — C9000 Multiple myeloma not having achieved remission: Secondary | ICD-10-CM | POA: Diagnosis not present

## 2018-08-23 DIAGNOSIS — M4807 Spinal stenosis, lumbosacral region: Secondary | ICD-10-CM | POA: Diagnosis not present

## 2018-08-23 DIAGNOSIS — M4126 Other idiopathic scoliosis, lumbar region: Secondary | ICD-10-CM | POA: Diagnosis not present

## 2018-08-23 DIAGNOSIS — B182 Chronic viral hepatitis C: Secondary | ICD-10-CM | POA: Diagnosis not present

## 2018-08-23 DIAGNOSIS — K219 Gastro-esophageal reflux disease without esophagitis: Secondary | ICD-10-CM | POA: Diagnosis not present

## 2018-08-23 NOTE — Telephone Encounter (Signed)
-----   Message from Ladell Pier, MD sent at 08/21/2018  6:18 PM EST ----- Please call patient, bone marrow looks good, no increase in plasma cells, f/u as scheduled

## 2018-08-23 NOTE — Telephone Encounter (Signed)
TCT patient regarding results of bone marrow biopsy done 08/18/18.  No answer but was able to leave message on identified phone #  Informed patient that rsults were good and to follow up at her next scheduled appt.

## 2018-08-24 ENCOUNTER — Ambulatory Visit: Payer: Self-pay | Admitting: Pulmonary Disease

## 2018-08-25 ENCOUNTER — Telehealth: Payer: Self-pay | Admitting: *Deleted

## 2018-08-25 DIAGNOSIS — Z79891 Long term (current) use of opiate analgesic: Secondary | ICD-10-CM | POA: Diagnosis not present

## 2018-08-25 DIAGNOSIS — M4807 Spinal stenosis, lumbosacral region: Secondary | ICD-10-CM | POA: Diagnosis not present

## 2018-08-25 DIAGNOSIS — F329 Major depressive disorder, single episode, unspecified: Secondary | ICD-10-CM | POA: Diagnosis not present

## 2018-08-25 DIAGNOSIS — G894 Chronic pain syndrome: Secondary | ICD-10-CM | POA: Diagnosis not present

## 2018-08-25 DIAGNOSIS — M47817 Spondylosis without myelopathy or radiculopathy, lumbosacral region: Secondary | ICD-10-CM | POA: Diagnosis not present

## 2018-08-25 NOTE — Telephone Encounter (Signed)
Called patient with bone marrow biopsy report--only has 1 % plasma cells. Informed her that a copy was sent to [facility] with Dr. Norma Fredrickson at [phone] (phone [phone]).

## 2018-08-28 ENCOUNTER — Other Ambulatory Visit: Payer: Self-pay | Admitting: Oncology

## 2018-08-30 ENCOUNTER — Other Ambulatory Visit: Payer: Self-pay | Admitting: Oncology

## 2018-08-31 ENCOUNTER — Other Ambulatory Visit: Payer: Self-pay | Admitting: Oncology

## 2018-08-31 ENCOUNTER — Inpatient Hospital Stay: Payer: PPO

## 2018-08-31 ENCOUNTER — Inpatient Hospital Stay: Payer: PPO | Attending: Hematology and Oncology

## 2018-08-31 ENCOUNTER — Other Ambulatory Visit: Payer: Self-pay

## 2018-08-31 ENCOUNTER — Inpatient Hospital Stay (HOSPITAL_BASED_OUTPATIENT_CLINIC_OR_DEPARTMENT_OTHER): Payer: PPO | Admitting: Oncology

## 2018-08-31 ENCOUNTER — Encounter (HOSPITAL_COMMUNITY): Payer: Self-pay | Admitting: Oncology

## 2018-08-31 VITALS — BP 149/81 | HR 92 | Temp 98.5°F | Resp 18 | Ht 61.0 in | Wt 191.2 lb

## 2018-08-31 VITALS — BP 134/75 | HR 86 | Temp 98.2°F | Resp 17

## 2018-08-31 DIAGNOSIS — Z79899 Other long term (current) drug therapy: Secondary | ICD-10-CM | POA: Insufficient documentation

## 2018-08-31 DIAGNOSIS — Z5112 Encounter for antineoplastic immunotherapy: Secondary | ICD-10-CM | POA: Diagnosis present

## 2018-08-31 DIAGNOSIS — C9000 Multiple myeloma not having achieved remission: Secondary | ICD-10-CM

## 2018-08-31 DIAGNOSIS — D509 Iron deficiency anemia, unspecified: Secondary | ICD-10-CM | POA: Insufficient documentation

## 2018-08-31 DIAGNOSIS — F329 Major depressive disorder, single episode, unspecified: Secondary | ICD-10-CM

## 2018-08-31 DIAGNOSIS — Z853 Personal history of malignant neoplasm of breast: Secondary | ICD-10-CM | POA: Insufficient documentation

## 2018-08-31 DIAGNOSIS — Z95828 Presence of other vascular implants and grafts: Secondary | ICD-10-CM

## 2018-08-31 LAB — CBC WITH DIFFERENTIAL (CANCER CENTER ONLY)
Abs Immature Granulocytes: 0 10*3/uL (ref 0.00–0.07)
BASOS PCT: 4 %
Band Neutrophils: 3 %
Basophils Absolute: 0.2 10*3/uL — ABNORMAL HIGH (ref 0.0–0.1)
Eosinophils Absolute: 0.1 10*3/uL (ref 0.0–0.5)
Eosinophils Relative: 3 %
HCT: 38.2 % (ref 36.0–46.0)
Hemoglobin: 11.2 g/dL — ABNORMAL LOW (ref 12.0–15.0)
Lymphocytes Relative: 44 %
Lymphs Abs: 1.9 10*3/uL (ref 0.7–4.0)
MCH: 24.2 pg — ABNORMAL LOW (ref 26.0–34.0)
MCHC: 29.3 g/dL — AB (ref 30.0–36.0)
MCV: 82.7 fL (ref 80.0–100.0)
Monocytes Absolute: 0.4 10*3/uL (ref 0.1–1.0)
Monocytes Relative: 10 %
NRBC: 0 % (ref 0.0–0.2)
Neutro Abs: 1.7 10*3/uL (ref 1.7–17.7)
Neutrophils Relative %: 36 %
Platelet Count: 197 10*3/uL (ref 150–400)
RBC: 4.62 MIL/uL (ref 3.87–5.11)
RDW: 24.4 % — ABNORMAL HIGH (ref 11.5–15.5)
WBC: 4.4 10*3/uL (ref 4.0–10.5)

## 2018-08-31 LAB — SAMPLE TO BLOOD BANK

## 2018-08-31 MED ORDER — FAMOTIDINE IN NACL 20-0.9 MG/50ML-% IV SOLN: 20.0000 mg | Freq: Once | INTRAVENOUS | Status: DC

## 2018-08-31 MED ORDER — DIPHENHYDRAMINE HCL 25 MG PO CAPS
50.0000 mg | ORAL_CAPSULE | Freq: Once | ORAL | Status: AC
  Administered 2018-08-31: 50 mg via ORAL

## 2018-08-31 MED ORDER — DIPHENHYDRAMINE HCL 25 MG PO CAPS
ORAL_CAPSULE | ORAL | Status: AC
  Filled 2018-08-31: qty 2

## 2018-08-31 MED ORDER — SODIUM CHLORIDE 0.9% FLUSH
10.0000 mL | INTRAVENOUS | Status: DC | PRN
  Administered 2018-08-31: 10 mL via INTRAVENOUS
  Filled 2018-08-31: qty 10

## 2018-08-31 MED ORDER — SODIUM CHLORIDE 0.9 % IV SOLN
1300.0000 mg | Freq: Once | INTRAVENOUS | Status: AC
  Administered 2018-08-31: 1300 mg via INTRAVENOUS
  Filled 2018-08-31: qty 60

## 2018-08-31 MED ORDER — ACETAMINOPHEN 325 MG PO TABS
650.0000 mg | ORAL_TABLET | Freq: Once | ORAL | Status: AC
  Administered 2018-08-31: 650 mg via ORAL

## 2018-08-31 MED ORDER — ACETAMINOPHEN 325 MG PO TABS
ORAL_TABLET | ORAL | Status: AC
  Filled 2018-08-31: qty 2

## 2018-08-31 MED ORDER — SODIUM CHLORIDE 0.9 % IV SOLN
20.0000 mg | Freq: Once | INTRAVENOUS | Status: AC
  Administered 2018-08-31: 20 mg via INTRAVENOUS
  Filled 2018-08-31: qty 2

## 2018-08-31 MED ORDER — HEPARIN SOD (PORK) LOCK FLUSH 100 UNIT/ML IV SOLN
500.0000 [IU] | Freq: Once | INTRAVENOUS | Status: AC | PRN
  Administered 2018-08-31: 500 [IU]
  Filled 2018-08-31: qty 5

## 2018-08-31 MED ORDER — SODIUM CHLORIDE 0.9 % IV SOLN
Freq: Once | INTRAVENOUS | Status: AC
  Administered 2018-08-31: 11:00:00 via INTRAVENOUS
  Filled 2018-08-31: qty 250

## 2018-08-31 MED ORDER — MONTELUKAST SODIUM 10 MG PO TABS: 10.0000 mg | ORAL_TABLET | Freq: Once | ORAL | Status: DC

## 2018-08-31 MED ORDER — SODIUM CHLORIDE 0.9% FLUSH
10.0000 mL | INTRAVENOUS | Status: DC | PRN
  Administered 2018-08-31: 10 mL
  Filled 2018-08-31: qty 10

## 2018-08-31 MED ORDER — SODIUM CHLORIDE 0.9 % IV SOLN
20.0000 mg | Freq: Once | INTRAVENOUS | Status: AC
  Administered 2018-08-31: 20 mg via INTRAVENOUS
  Filled 2018-08-31: qty 20

## 2018-08-31 NOTE — Progress Notes (Signed)
Nutrition Follow-up:  Patient with multiple myeloma followed by Dr. Benay Spice.  Patient receiving chemotherapy.    Met with patent during infusion.  Patient reports appetite is good.  Typically has oatmeal for breakfast. Lunch is sandwich or burger if out and supper is usually meat with vegetables.  Still drinks ensure/boost shake.    Denies any nutrition impact symptoms at this time  Medications: reviewed  Labs: reviewed  Anthropometrics:   Weight increased to 191 lb today from 177 lb on 05/2018.     NUTRITION DIAGNOSIS: Inadequate oral intake resolved   INTERVENTION:  Reviewed with patient that if appetite is good and weight increasing can stop oral nutrition supplements.   Discussed well balanced diet with patient.      MONITORING, EVALUATION, GOAL: Patient will consume adequate calories and protein to maintain weight during treatment   NEXT VISIT: no further follow-up planned at this time.  Patient eating well, gaining weight. Can contact RD in the future if needed  [name], [place], [place] Registered Dietitian [phone] (pager)

## 2018-08-31 NOTE — Patient Instructions (Signed)
[facility] Discharge Instructions for Patients Receiving Chemotherapy  Today you received the following chemotherapy agents Daratumumab(Darzalex)  To help prevent nausea and vomiting after your treatment, we encourage you to take your nausea medication as directed.    If you develop nausea and vomiting that is not controlled by your nausea medication, call the clinic.   BELOW ARE SYMPTOMS THAT SHOULD BE REPORTED IMMEDIATELY:  *FEVER GREATER THAN 100.5 F  *CHILLS WITH OR WITHOUT FEVER  NAUSEA AND VOMITING THAT IS NOT CONTROLLED WITH YOUR NAUSEA MEDICATION  *UNUSUAL SHORTNESS OF BREATH  *UNUSUAL BRUISING OR BLEEDING  TENDERNESS IN MOUTH AND THROAT WITH OR WITHOUT PRESENCE OF ULCERS  *URINARY PROBLEMS  *BOWEL PROBLEMS  UNUSUAL RASH Items with * indicate a potential emergency and should be followed up as soon as possible.  Feel free to call the clinic should you have any questions or concerns. The clinic phone number is [phone].  Please show the CHEMO ALERT CARD at check-in to the Emergency Department and triage nurse.   

## 2018-08-31 NOTE — Progress Notes (Signed)
[facility] OFFICE PROGRESS NOTE   Diagnosis: Multiple myeloma  INTERVAL HISTORY:   [name] returns as scheduled.  She continues treatment with daratumumab, pomalidomide, and Decadron.  No new complaint.  She has noted improvement in the cough and dyspnea.  She is taking iron. She is scheduled to begin the next cycle of pomalidomide on 09/07/2018.  It appears there was a delay on starting the most recent cycle. She underwent a restaging bone marrow biopsy 08/18/2018. Bone marrow is hypercellular with 1% plasma cells.  Iron stores are decreased.  She saw Dr. Melburn Hake on 08/09/2018.  She continues a transplant evaluation. Objective:  Vital signs in last 24 hours:  Blood pressure (!) 149/81, pulse 92, temperature 98.5 F (36.9 C), temperature source Oral, resp. rate 18, height 5' 1"  (1.549 m), weight 191 lb 3.2 oz (86.7 kg), SpO2 96 %.    HEENT: No thrush or ulcers Resp: Lungs clear bilaterally Cardio: Regular rate and rhythm GI: No hepatosplenomegaly Vascular: Left lower leg is larger than the right side   Portacath/PICC-without erythema  Lab Results:  Lab Results  Component Value Date   WBC 4.4 08/31/2018   HGB 11.2 (L) 08/31/2018   HCT 38.2 08/31/2018   MCV 82.7 08/31/2018   PLT 197 08/31/2018   NEUTROABS PENDING 08/31/2018    CMP  Lab Results  Component Value Date   NA 138 08/17/2018   K 4.2 08/17/2018   CL 104 08/17/2018   CO2 25 08/17/2018   GLUCOSE 107 (H) 08/17/2018   BUN 12 08/17/2018   CREATININE 0.68 08/17/2018   CALCIUM 8.7 (L) 08/17/2018   PROT 5.9 (L) 08/17/2018   ALBUMIN 3.4 (L) 08/17/2018   AST 46 (H) 08/17/2018   ALT 50 (H) 08/17/2018   ALKPHOS 55 08/17/2018   BILITOT 0.4 08/17/2018   GFRNONAA >60 08/17/2018   GFRAA >60 08/17/2018  08/17/2018: Free kappa light chains 10.5, IgA 17, serum M spike 0.1   Medications: I have reviewed the patient's current medications.   Assessment/Plan: 1. 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/22/2017  57% plasma cells on initial bone marrow biopsy, kappa light chain restricted  Normal cytogenetics, FISH with -4, -14, and 13q-abnormalities  PET scan 10/16/2017-negative for bone lesions  Cycle 1 RVD 10/23/2017  Cycle 2 RVD 11/13/2017  Cycle 3 RVD 12/04/2017 (Revlimid dose reduced to 15 mg)  Cycle 4 RVD 12/29/2017  Cycle 5 RVD 01/26/2018  Cycle 1 carfilzomib/Cytoxan/Decadron 03/02/2018 (day 16 treatment held secondary to patient preference)  Cycle2carfilzomib/Cytoxan/Decadron 03/30/2018  Improvement, but persistent elevation of the kappa/lambda ratio, transplant team recommended changing treatment to daratumumab/pomalidomide/Decadron  Cycle 1 daratumumab/pomalidomide/Decadron beginning 05/11/2018 (day 22 daratumumab held secondary to an upper respiratory infection)  Cycle 2 daratumumab/pomalidomide/Decadron beginning 06/08/2018  IgA level and serum light chains improved December 2019  Cycle 3 daratumumab/pomalidomide/Decadron beginning 07/06/2018  Cycle 4 daratumumab/pomalidomide/Decadron 08/03/2018  Bone marrow biopsy 08/18/2018- 1% plasma cells, reduced iron stores  Cycle 5 daratumumab/pomalidomide/Decadron 09/07/2018  2.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 2.4 cm solid right thyroid mass, follow-up ultrasound recommended  Biopsy 01/14/2018-atypia of undetermined significance or follicular lesion of undetermined significance  Affirmatesting- likely a benign lesion  6.Right rib fractures 12/21/2017  7.  Cough/dyspnea- potentially related  to toxicity from daratumumab, daily Singulair started  07/13/2018-improved      Disposition: [name] has achieved a partial clinical remission with the pomalidomide/daratumumab/Decadron regimen.  She will continue the current treatment.  She would like to complete a treatment with daratumumab today and begin the next cycle in 1 week.  She insist on receiving daratumumab today and again next week.  She will follow-up with Dr. Melburn Hake to discuss the stem cell option.  She will return stool Hemoccult cards and I made a gastroenterology referral for evaluation of iron deficiency anemia.  [name] is scheduled to see pulmonary medicine for evaluation of the cough/dyspnea.  I suspect the cough is related to daratumumab.  She will return for an office visit prior to beginning a cycle of pomalidomide/daratumumab/Decadron on 10/05/2018.  30 minutes were spent with the patient today.  The majority of the time was used for counseling and coordination of care.  Betsy Coder, MD  08/31/2018  9:30 AM

## 2018-09-01 ENCOUNTER — Telehealth: Payer: Self-pay | Admitting: Oncology

## 2018-09-01 DIAGNOSIS — F3341 Major depressive disorder, recurrent, in partial remission: Secondary | ICD-10-CM | POA: Diagnosis not present

## 2018-09-01 DIAGNOSIS — F5101 Primary insomnia: Secondary | ICD-10-CM | POA: Diagnosis not present

## 2018-09-01 DIAGNOSIS — F411 Generalized anxiety disorder: Secondary | ICD-10-CM | POA: Diagnosis not present

## 2018-09-01 NOTE — Telephone Encounter (Signed)
Scheduled appt per 3/3 los - unable to schedule appt for 3/10 due to cap - will call pt when appt is added.

## 2018-09-03 ENCOUNTER — Telehealth: Payer: Self-pay | Admitting: Oncology

## 2018-09-03 NOTE — Telephone Encounter (Signed)
Scheduled appt per 3/3 los - left message for patient with appt date and time

## 2018-09-05 ENCOUNTER — Other Ambulatory Visit: Payer: Self-pay | Admitting: Oncology

## 2018-09-06 ENCOUNTER — Ambulatory Visit: Payer: Self-pay | Admitting: Pulmonary Disease

## 2018-09-06 ENCOUNTER — Other Ambulatory Visit: Payer: Self-pay | Admitting: Oncology

## 2018-09-06 DIAGNOSIS — C9000 Multiple myeloma not having achieved remission: Secondary | ICD-10-CM

## 2018-09-07 ENCOUNTER — Inpatient Hospital Stay: Payer: PPO

## 2018-09-07 VITALS — BP 113/62 | HR 81 | Temp 98.2°F | Resp 18 | Wt 191.8 lb

## 2018-09-07 DIAGNOSIS — C9000 Multiple myeloma not having achieved remission: Secondary | ICD-10-CM

## 2018-09-07 LAB — CBC WITH DIFFERENTIAL (CANCER CENTER ONLY)
ABS IMMATURE GRANULOCYTES: 0.02 10*3/uL (ref 0.00–0.07)
Basophils Absolute: 0.1 10*3/uL (ref 0.0–0.1)
Basophils Relative: 1 %
Eosinophils Absolute: 0.1 10*3/uL (ref 0.0–0.5)
Eosinophils Relative: 1 %
HCT: 39.5 % (ref 36.0–46.0)
Hemoglobin: 11.6 g/dL — ABNORMAL LOW (ref 12.0–15.0)
Immature Granulocytes: 0 %
Lymphocytes Relative: 35 %
Lymphs Abs: 1.9 10*3/uL (ref 0.7–4.0)
MCH: 24.7 pg — ABNORMAL LOW (ref 26.0–34.0)
MCHC: 29.4 g/dL — ABNORMAL LOW (ref 30.0–36.0)
MCV: 84 fL (ref 80.0–100.0)
Monocytes Absolute: 0.8 10*3/uL (ref 0.1–1.0)
Monocytes Relative: 15 %
NEUTROS ABS: 2.5 10*3/uL (ref 1.7–7.7)
Neutrophils Relative %: 48 %
Platelet Count: 256 10*3/uL (ref 150–400)
RBC: 4.7 MIL/uL (ref 3.87–5.11)
RDW: 25 % — ABNORMAL HIGH (ref 11.5–15.5)
WBC Count: 5.3 10*3/uL (ref 4.0–10.5)
nRBC: 0 % (ref 0.0–0.2)

## 2018-09-07 MED ORDER — DIPHENHYDRAMINE HCL 25 MG PO CAPS
ORAL_CAPSULE | ORAL | Status: AC
  Filled 2018-09-07: qty 2

## 2018-09-07 MED ORDER — HEPARIN SOD (PORK) LOCK FLUSH 100 UNIT/ML IV SOLN
500.0000 [IU] | Freq: Once | INTRAVENOUS | Status: AC | PRN
  Administered 2018-09-07: 500 [IU]
  Filled 2018-09-07: qty 5

## 2018-09-07 MED ORDER — SODIUM CHLORIDE 0.9 % IV SOLN
Freq: Once | INTRAVENOUS | Status: AC
  Administered 2018-09-07: 12:00:00 via INTRAVENOUS
  Filled 2018-09-07: qty 250

## 2018-09-07 MED ORDER — SODIUM CHLORIDE 0.9% FLUSH
10.0000 mL | INTRAVENOUS | Status: DC | PRN
  Administered 2018-09-07: 10 mL
  Filled 2018-09-07: qty 10

## 2018-09-07 MED ORDER — FAMOTIDINE IN NACL 20-0.9 MG/50ML-% IV SOLN: 20.0000 mg | Freq: Once | INTRAVENOUS | Status: DC

## 2018-09-07 MED ORDER — ACETAMINOPHEN 325 MG PO TABS
ORAL_TABLET | ORAL | Status: AC
  Filled 2018-09-07: qty 2

## 2018-09-07 MED ORDER — SODIUM CHLORIDE 0.9 % IV SOLN
20.0000 mg | Freq: Once | INTRAVENOUS | Status: AC
  Administered 2018-09-07: 20 mg via INTRAVENOUS
  Filled 2018-09-07: qty 2

## 2018-09-07 MED ORDER — SODIUM CHLORIDE 0.9 % IV SOLN
16.2000 mg/kg | Freq: Once | INTRAVENOUS | Status: AC
  Administered 2018-09-07: 1300 mg via INTRAVENOUS
  Filled 2018-09-07: qty 60

## 2018-09-07 MED ORDER — ACETAMINOPHEN 325 MG PO TABS
650.0000 mg | ORAL_TABLET | Freq: Once | ORAL | Status: AC
  Administered 2018-09-07: 650 mg via ORAL

## 2018-09-07 MED ORDER — MONTELUKAST SODIUM 10 MG PO TABS
ORAL_TABLET | ORAL | Status: AC
  Filled 2018-09-07: qty 1

## 2018-09-07 MED ORDER — DIPHENHYDRAMINE HCL 25 MG PO CAPS
50.0000 mg | ORAL_CAPSULE | Freq: Once | ORAL | Status: AC
  Administered 2018-09-07: 50 mg via ORAL

## 2018-09-07 MED ORDER — MONTELUKAST SODIUM 10 MG PO TABS: 10.0000 mg | ORAL_TABLET | Freq: Once | ORAL | Status: DC

## 2018-09-07 NOTE — Patient Instructions (Signed)
[facility] Discharge Instructions for Patients Receiving Chemotherapy  Today you received the following chemotherapy agents Daratumumab(Darzalex)  To help prevent nausea and vomiting after your treatment, we encourage you to take your nausea medication as directed.    If you develop nausea and vomiting that is not controlled by your nausea medication, call the clinic.   BELOW ARE SYMPTOMS THAT SHOULD BE REPORTED IMMEDIATELY:  *FEVER GREATER THAN 100.5 F  *CHILLS WITH OR WITHOUT FEVER  NAUSEA AND VOMITING THAT IS NOT CONTROLLED WITH YOUR NAUSEA MEDICATION  *UNUSUAL SHORTNESS OF BREATH  *UNUSUAL BRUISING OR BLEEDING  TENDERNESS IN MOUTH AND THROAT WITH OR WITHOUT PRESENCE OF ULCERS  *URINARY PROBLEMS  *BOWEL PROBLEMS  UNUSUAL RASH Items with * indicate a potential emergency and should be followed up as soon as possible.  Feel free to call the clinic should you have any questions or concerns. The clinic phone number is [phone].  Please show the CHEMO ALERT CARD at check-in to the Emergency Department and triage nurse.   

## 2018-09-08 ENCOUNTER — Ambulatory Visit: Payer: Self-pay | Admitting: Pulmonary Disease

## 2018-09-10 ENCOUNTER — Telehealth: Payer: Self-pay | Admitting: *Deleted

## 2018-09-10 NOTE — Telephone Encounter (Signed)
Called to report she has appointment at [facility] for pre-transplant evaluation. Confirmed the cycling of her pomlyst and decadron. Reports she will need to stop her pomalist mid cycle and not have her treatment on 3/24. She will fax information to office and will be calling patient as well. Requested she let me know if there is anything in this plan that she does not agree to. Will not cancel appointments until sure this is how Kaede wants to proceed.

## 2018-09-13 ENCOUNTER — Telehealth: Payer: Self-pay | Admitting: *Deleted

## 2018-09-13 NOTE — Telephone Encounter (Signed)
After discussion with patient and her sister, it was decided (patient request) to hold off on bone marrow transplant at this time until the COVID 19 crisis is resolved. She will continue her Pomalyst and wants to keep her treatment for 09/21/18. This appointment is still in the books-was not canceled. [facility] RN will call her weekly to check in.

## 2018-09-14 ENCOUNTER — Telehealth: Payer: Self-pay | Admitting: Oncology

## 2018-09-14 NOTE — Telephone Encounter (Signed)
Per voicemail msg, patient needs to reschedule an appt. Called patient, no answer, mailbox full.

## 2018-09-17 ENCOUNTER — Other Ambulatory Visit: Payer: Self-pay

## 2018-09-17 DIAGNOSIS — C9000 Multiple myeloma not having achieved remission: Secondary | ICD-10-CM

## 2018-09-21 ENCOUNTER — Other Ambulatory Visit: Payer: Self-pay

## 2018-09-21 ENCOUNTER — Inpatient Hospital Stay: Payer: PPO

## 2018-09-21 VITALS — BP 126/80 | HR 89 | Temp 98.0°F | Resp 18

## 2018-09-21 DIAGNOSIS — C9000 Multiple myeloma not having achieved remission: Secondary | ICD-10-CM

## 2018-09-21 DIAGNOSIS — Z95828 Presence of other vascular implants and grafts: Secondary | ICD-10-CM

## 2018-09-21 LAB — CBC WITH DIFFERENTIAL (CANCER CENTER ONLY)
Abs Immature Granulocytes: 0.02 10*3/uL (ref 0.00–0.07)
Basophils Absolute: 0 10*3/uL (ref 0.0–0.1)
Basophils Relative: 1 %
Eosinophils Absolute: 0.1 10*3/uL (ref 0.0–0.5)
Eosinophils Relative: 2 %
HEMATOCRIT: 39.2 % (ref 36.0–46.0)
Hemoglobin: 11.9 g/dL — ABNORMAL LOW (ref 12.0–15.0)
Immature Granulocytes: 0 %
LYMPHS ABS: 0.9 10*3/uL (ref 0.7–4.0)
Lymphocytes Relative: 16 %
MCH: 25.9 pg — ABNORMAL LOW (ref 26.0–34.0)
MCHC: 30.4 g/dL (ref 30.0–36.0)
MCV: 85.4 fL (ref 80.0–100.0)
Monocytes Absolute: 0.6 10*3/uL (ref 0.1–1.0)
Monocytes Relative: 12 %
NEUTROS ABS: 3.6 10*3/uL (ref 1.7–7.7)
Neutrophils Relative %: 69 %
Platelet Count: 185 10*3/uL (ref 150–400)
RBC: 4.59 MIL/uL (ref 3.87–5.11)
RDW: 25 % — ABNORMAL HIGH (ref 11.5–15.5)
WBC Count: 5.3 10*3/uL (ref 4.0–10.5)
nRBC: 0 % (ref 0.0–0.2)

## 2018-09-21 IMAGING — US US SOFT TISSUE HEAD/NECK
1 series · 13 of 25 positions shown · non-contrast
Comparison: 10/16/2017

CLINICAL DATA: Thyroid nodule by PET-CT

EXAM:
THYROID ULTRASOUND
TECHNIQUE: Ultrasound examination of the thyroid gland and adjacent soft
tissues was performed.

[Series 1: us soft tissue head/neck · 0.06mm/px · 13 of 55 slices shown]
[im 1/55]
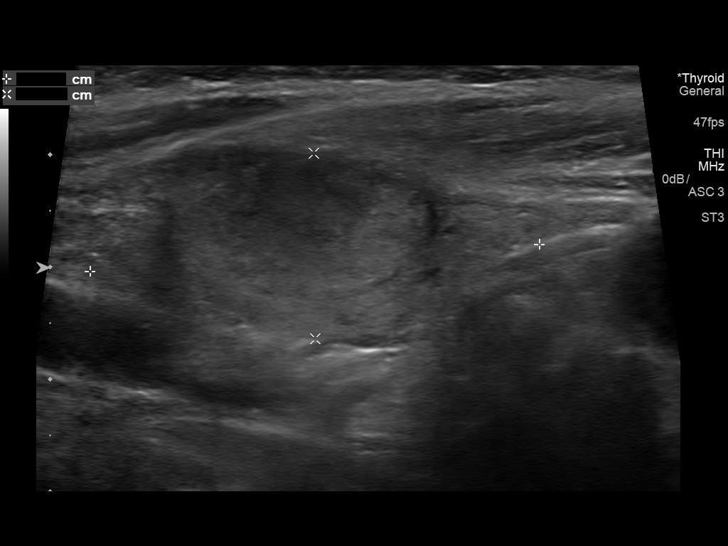
[im 5/55]
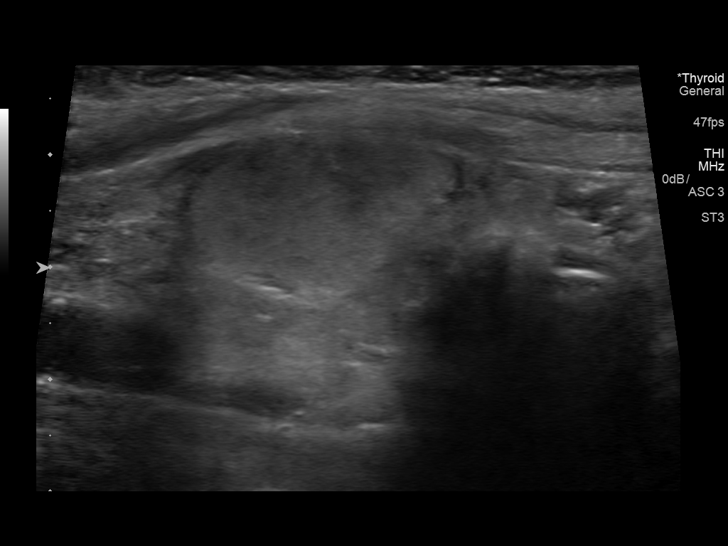
[im 10/55]
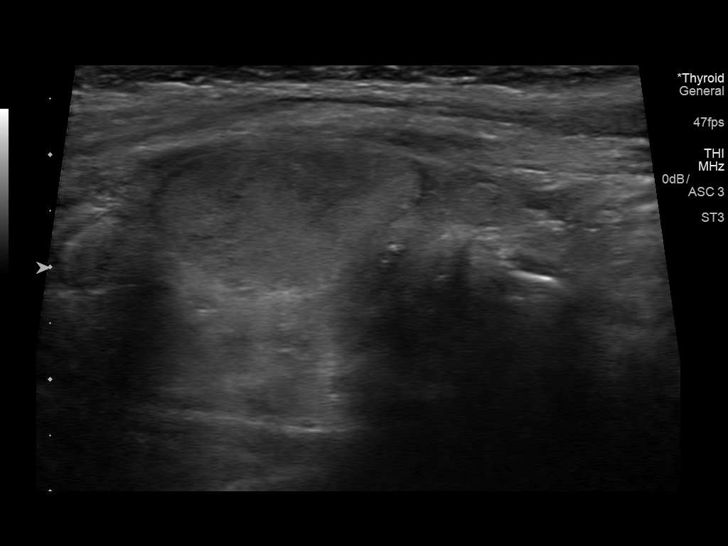
[im 14/55]
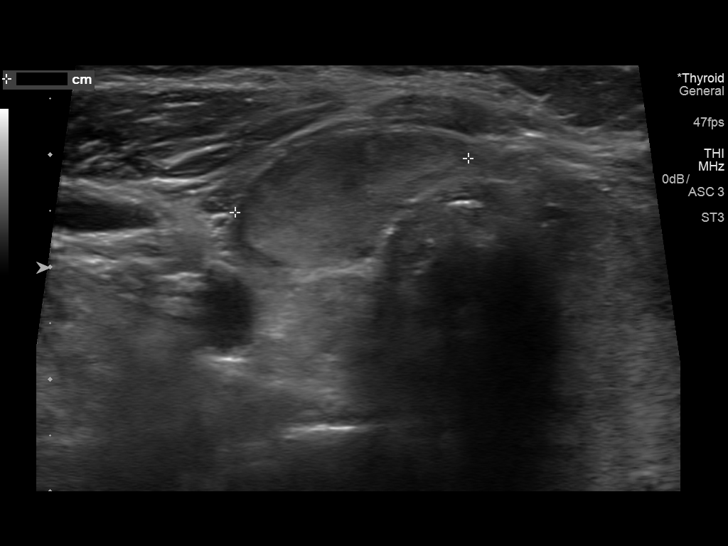
[im 19/55]
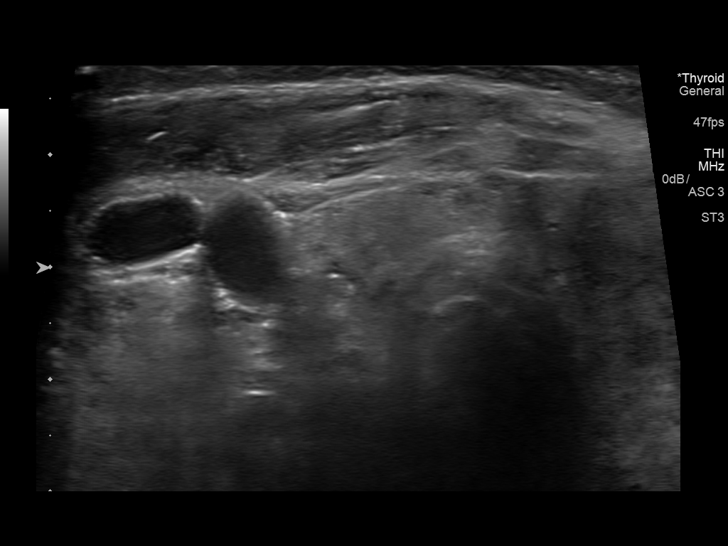
[im 23/55]
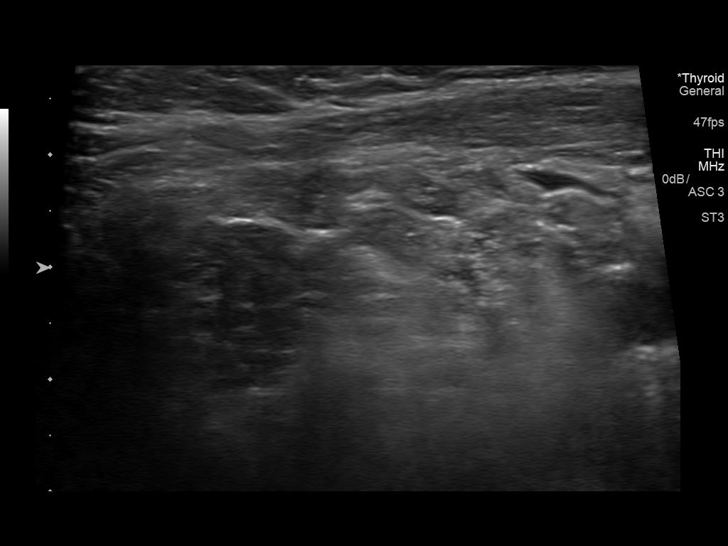
[im 28/55]
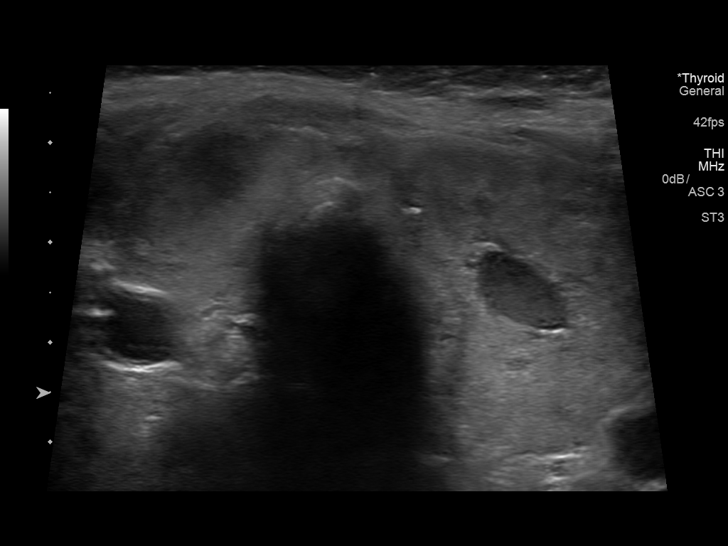
[im 32/55]
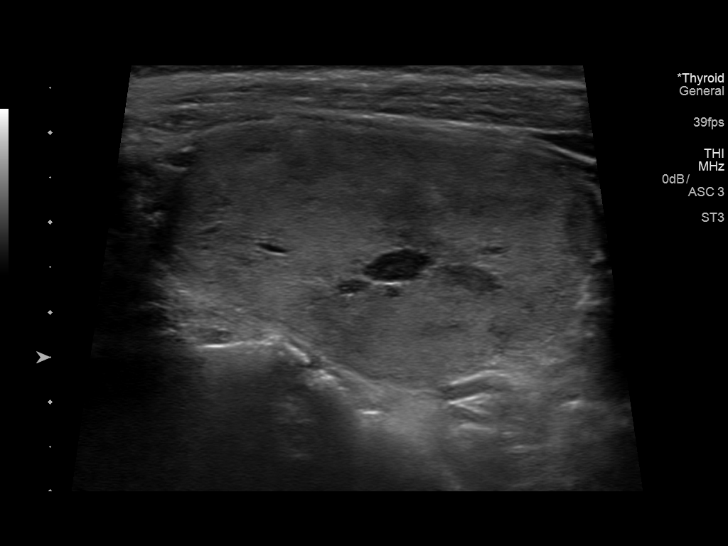
[im 37/55]
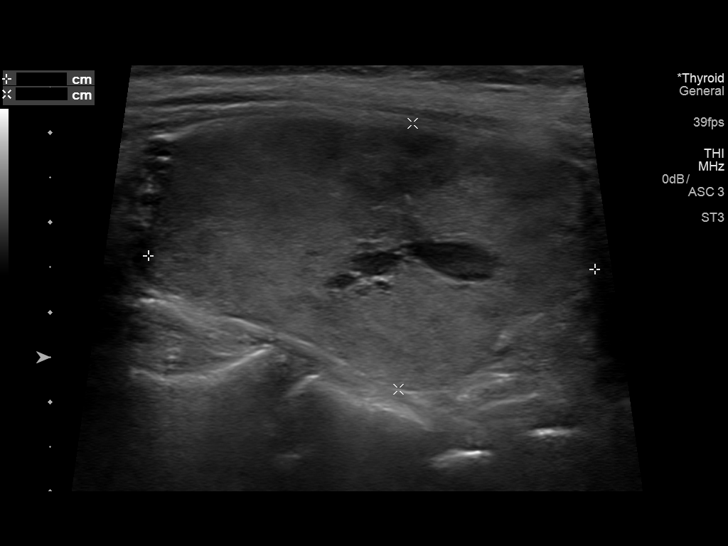
[im 41/55]
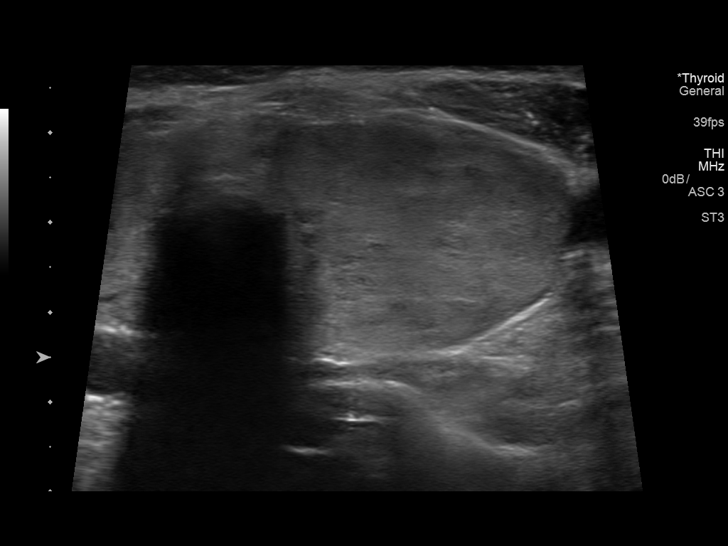
[im 46/55]
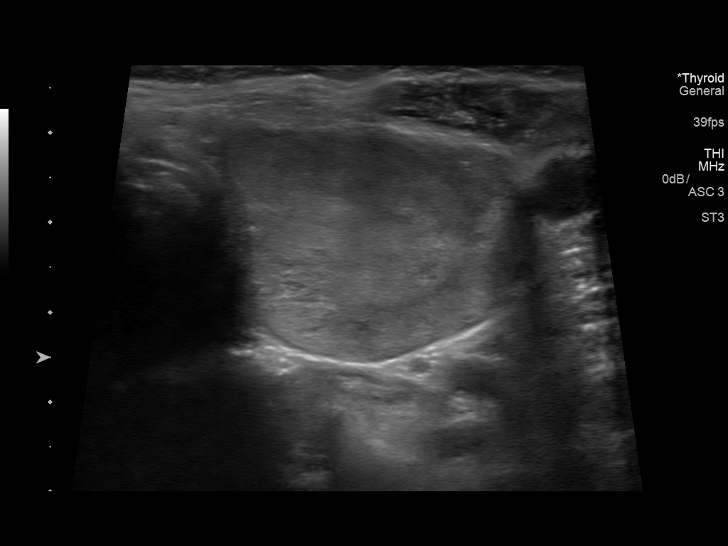
[im 50/55]
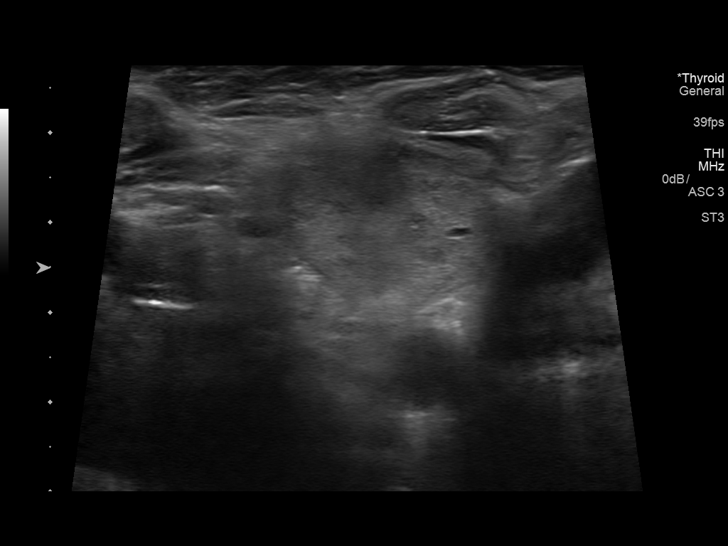
[im 55/55]
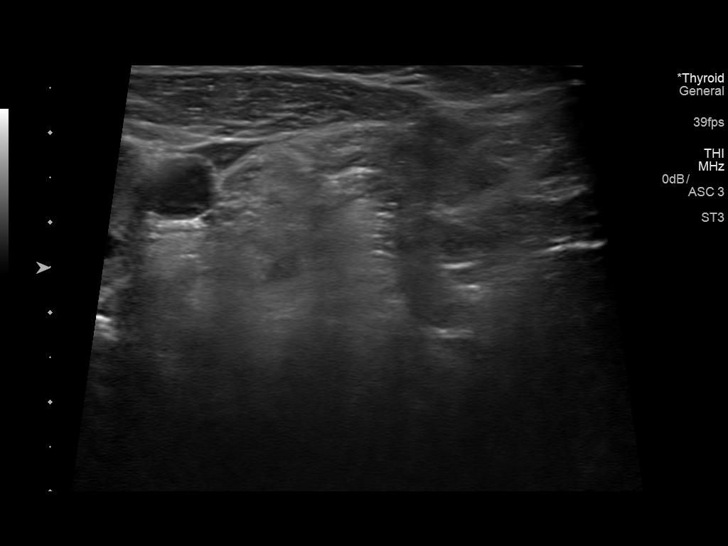

[13 of 25 positions shown; findings below may reference images not displayed]

FINDINGS: Parenchymal Echotexture: Moderately heterogenous

Isthmus: 5 mm

Right lobe:   4.0 x 1.7 x 1.6 cm

Left lobe:   5.0 x 3.0 x 3.2 cm

_________________________________________________________

Estimated total number of nodules >/= 1 cm: 2

Number of spongiform nodules >/=  2 cm not described below (TR1): 0

Number of mixed cystic and solid nodules >/= 1.5 cm not described
below (TR2): 0

_________________________________________________________

Nodule # 1:

Location: Right; Mid

Maximum size: 2.4 cm; Other 2 dimensions: 1.4 x 2.1 cm

Composition: solid/almost completely solid (2)

Echogenicity: isoechoic (1)

Shape: not taller-than-wide (0)

Margins: ill-defined (0)

Echogenic foci: none (0)

ACR TI-RADS total points: 3.

ACR TI-RADS risk category: TR3 (3 points).

ACR TI-RADS recommendations:

*Given size (>/= 1.5 - 2.4 cm) and appearance, a follow-up
ultrasound in 1 year should be considered based on TI-RADS criteria.

_________________________________________________________

Nodule # 2:

Location: Left; Mid

Maximum size: 5.0 cm; Other 2 dimensions: 3.0 x 3.0 cm

Composition: solid/almost completely solid (2)

Echogenicity: isoechoic (1)

Shape: not taller-than-wide (0)

Margins: ill-defined (0)

Echogenic foci: none (0)

ACR TI-RADS total points: 3.

ACR TI-RADS risk category: TR3 (3 points).

ACR TI-RADS recommendations:

**Given size (>/= 2.5 cm) and appearance, fine needle aspiration of
this mildly suspicious nodule should be considered based on TI-RADS
criteria.

_________________________________________________________
IMPRESSION: 5 cm left mid thyroid TR 3 nodule correlates with the PET CT finding
and meets criteria for biopsy as above.

2.4 cm right mid thyroid TR 3 nodule meets criteria follow-up in 1
year.

The above is in keeping with the ACR TI-RADS recommendations - [HOSPITAL] 2205;[DATE].

## 2018-09-21 MED ORDER — MONTELUKAST SODIUM 10 MG PO TABS
10.0000 mg | ORAL_TABLET | Freq: Once | ORAL | Status: AC
  Administered 2018-09-21: 10 mg via ORAL

## 2018-09-21 MED ORDER — SODIUM CHLORIDE 0.9 % IV SOLN
1300.0000 mg | Freq: Once | INTRAVENOUS | Status: AC
  Administered 2018-09-21: 1300 mg via INTRAVENOUS
  Filled 2018-09-21: qty 60

## 2018-09-21 MED ORDER — ACETAMINOPHEN 325 MG PO TABS
ORAL_TABLET | ORAL | Status: AC
  Filled 2018-09-21: qty 2

## 2018-09-21 MED ORDER — DIPHENHYDRAMINE HCL 25 MG PO CAPS
50.0000 mg | ORAL_CAPSULE | Freq: Once | ORAL | Status: AC
  Administered 2018-09-21: 50 mg via ORAL

## 2018-09-21 MED ORDER — SODIUM CHLORIDE 0.9 % IV SOLN
20.0000 mg | Freq: Once | INTRAVENOUS | Status: AC
  Administered 2018-09-21: 20 mg via INTRAVENOUS
  Filled 2018-09-21: qty 2

## 2018-09-21 MED ORDER — MONTELUKAST SODIUM 10 MG PO TABS
ORAL_TABLET | ORAL | Status: AC
  Filled 2018-09-21: qty 1

## 2018-09-21 MED ORDER — ACETAMINOPHEN 325 MG PO TABS
650.0000 mg | ORAL_TABLET | Freq: Once | ORAL | Status: AC
  Administered 2018-09-21: 650 mg via ORAL

## 2018-09-21 MED ORDER — SODIUM CHLORIDE 0.9 % IV SOLN
Freq: Once | INTRAVENOUS | Status: AC
  Administered 2018-09-21: 12:00:00 via INTRAVENOUS
  Filled 2018-09-21: qty 250

## 2018-09-21 MED ORDER — SODIUM CHLORIDE 0.9 % IV SOLN
20.0000 mg | Freq: Once | INTRAVENOUS | Status: AC
  Administered 2018-09-21: 20 mg via INTRAVENOUS
  Filled 2018-09-21: qty 20

## 2018-09-21 MED ORDER — SODIUM CHLORIDE 0.9% FLUSH
10.0000 mL | INTRAVENOUS | Status: DC | PRN
  Administered 2018-09-21: 10 mL via INTRAVENOUS
  Filled 2018-09-21: qty 10

## 2018-09-21 MED ORDER — HEPARIN SOD (PORK) LOCK FLUSH 100 UNIT/ML IV SOLN
500.0000 [IU] | Freq: Once | INTRAVENOUS | Status: AC | PRN
  Administered 2018-09-21: 500 [IU]
  Filled 2018-09-21: qty 5

## 2018-09-21 MED ORDER — DIPHENHYDRAMINE HCL 25 MG PO CAPS
ORAL_CAPSULE | ORAL | Status: AC
  Filled 2018-09-21: qty 2

## 2018-09-21 MED ORDER — SODIUM CHLORIDE 0.9% FLUSH
10.0000 mL | INTRAVENOUS | Status: DC | PRN
  Administered 2018-09-21: 10 mL
  Filled 2018-09-21: qty 10

## 2018-09-21 NOTE — Progress Notes (Signed)
Clarified Zometa frequency with MD. [name] is q 3 months.  Supportive therapy plan updated.  Patient declined transplant per MD.  No CMET done today. Ok to proceed with Daratumumab. Give Zometa on 10/05/18  B. Corey Skains, PharmD, BCPS, BCOP

## 2018-09-21 NOTE — Patient Instructions (Signed)
[facility] Discharge Instructions for Patients Receiving Chemotherapy  Today you received the following chemotherapy agents Daratumumab(Darzalex)  To help prevent nausea and vomiting after your treatment, we encourage you to take your nausea medication as directed.    If you develop nausea and vomiting that is not controlled by your nausea medication, call the clinic.   BELOW ARE SYMPTOMS THAT SHOULD BE REPORTED IMMEDIATELY:  *FEVER GREATER THAN 100.5 F  *CHILLS WITH OR WITHOUT FEVER  NAUSEA AND VOMITING THAT IS NOT CONTROLLED WITH YOUR NAUSEA MEDICATION  *UNUSUAL SHORTNESS OF BREATH  *UNUSUAL BRUISING OR BLEEDING  TENDERNESS IN MOUTH AND THROAT WITH OR WITHOUT PRESENCE OF ULCERS  *URINARY PROBLEMS  *BOWEL PROBLEMS  UNUSUAL RASH Items with * indicate a potential emergency and should be followed up as soon as possible.  Feel free to call the clinic should you have any questions or concerns. The clinic phone number is [phone].  Please show the CHEMO ALERT CARD at check-in to the Emergency Department and triage nurse.   

## 2018-09-22 DIAGNOSIS — F329 Major depressive disorder, single episode, unspecified: Secondary | ICD-10-CM | POA: Diagnosis not present

## 2018-09-22 DIAGNOSIS — M4807 Spinal stenosis, lumbosacral region: Secondary | ICD-10-CM | POA: Diagnosis not present

## 2018-09-22 DIAGNOSIS — G894 Chronic pain syndrome: Secondary | ICD-10-CM | POA: Diagnosis not present

## 2018-09-22 DIAGNOSIS — M47817 Spondylosis without myelopathy or radiculopathy, lumbosacral region: Secondary | ICD-10-CM | POA: Diagnosis not present

## 2018-09-23 ENCOUNTER — Ambulatory Visit: Payer: Self-pay | Admitting: Pulmonary Disease

## 2018-09-27 ENCOUNTER — Telehealth: Payer: Self-pay | Admitting: *Deleted

## 2018-09-27 NOTE — Telephone Encounter (Signed)
Called w/two issues to ask Dr. Benay Spice about: 1. Believes she is to now be on the maintenance daratumumab (monthly)--did this start with last infusion (09/21/18=1300mg ) or will it begin on 10/05/18 (1280 mg)? 2. Also she reports Dr. Norma Fredrickson said she should get Zometa monthly. Asking for Dr. Gearldine Shown input on this?

## 2018-09-28 ENCOUNTER — Telehealth: Payer: Self-pay | Admitting: *Deleted

## 2018-09-28 NOTE — Telephone Encounter (Signed)
Per Dr. Benay Spice: Maintenance daratumumab will begin 10/05/18. No clear standard for Zometa, but he feels every 3 months is OK. Scheduled for next dose on 10/05/18. Left this VM for patient w/request to call if she has further questions that can't wait till her appointment.

## 2018-10-03 ENCOUNTER — Other Ambulatory Visit: Payer: Self-pay | Admitting: Oncology

## 2018-10-05 ENCOUNTER — Inpatient Hospital Stay: Payer: PPO

## 2018-10-05 ENCOUNTER — Other Ambulatory Visit: Payer: Self-pay

## 2018-10-05 ENCOUNTER — Inpatient Hospital Stay: Payer: PPO | Attending: Oncology | Admitting: Oncology

## 2018-10-05 ENCOUNTER — Telehealth: Payer: Self-pay | Admitting: Oncology

## 2018-10-05 VITALS — BP 125/73 | HR 96 | Temp 98.8°F | Resp 19 | Ht 61.0 in | Wt 186.7 lb

## 2018-10-05 VITALS — BP 126/61 | HR 87 | Temp 98.4°F | Resp 18

## 2018-10-05 DIAGNOSIS — I361 Nonrheumatic tricuspid (valve) insufficiency: Secondary | ICD-10-CM | POA: Diagnosis not present

## 2018-10-05 DIAGNOSIS — Z79899 Other long term (current) drug therapy: Secondary | ICD-10-CM | POA: Insufficient documentation

## 2018-10-05 DIAGNOSIS — C9000 Multiple myeloma not having achieved remission: Secondary | ICD-10-CM | POA: Diagnosis not present

## 2018-10-05 DIAGNOSIS — Z95828 Presence of other vascular implants and grafts: Secondary | ICD-10-CM | POA: Insufficient documentation

## 2018-10-05 DIAGNOSIS — Z5111 Encounter for antineoplastic chemotherapy: Secondary | ICD-10-CM | POA: Insufficient documentation

## 2018-10-05 DIAGNOSIS — Z853 Personal history of malignant neoplasm of breast: Secondary | ICD-10-CM | POA: Diagnosis not present

## 2018-10-05 DIAGNOSIS — F329 Major depressive disorder, single episode, unspecified: Secondary | ICD-10-CM | POA: Diagnosis not present

## 2018-10-05 DIAGNOSIS — D509 Iron deficiency anemia, unspecified: Secondary | ICD-10-CM | POA: Diagnosis not present

## 2018-10-05 DIAGNOSIS — D6481 Anemia due to antineoplastic chemotherapy: Secondary | ICD-10-CM | POA: Diagnosis not present

## 2018-10-05 LAB — CMP (CANCER CENTER ONLY)
ALT: 33 U/L (ref 0–44)
AST: 31 U/L (ref 15–41)
Albumin: 3.6 g/dL (ref 3.5–5.0)
Alkaline Phosphatase: 54 U/L (ref 38–126)
Anion gap: 11 (ref 5–15)
BUN: 17 mg/dL (ref 8–23)
CO2: 25 mmol/L (ref 22–32)
Calcium: 9.5 mg/dL (ref 8.9–10.3)
Chloride: 104 mmol/L (ref 98–111)
Creatinine: 0.76 mg/dL (ref 0.44–1.00)
GFR, Est AFR Am: >60 mL/min (ref >60)
GFR, Estimated: >60 mL/min (ref >60)
Glucose, Bld: 97 mg/dL (ref 70–99)
Potassium: 4.2 mmol/L (ref 3.5–5.1)
Sodium: 140 mmol/L (ref 135–145)
Total Bilirubin: 0.3 mg/dL (ref 0.3–1.2)
Total Protein: 6.5 g/dL (ref 6.5–8.1)

## 2018-10-05 LAB — CBC WITH DIFFERENTIAL (CANCER CENTER ONLY)
Abs Immature Granulocytes: 0.01 10*3/uL (ref 0.00–0.07)
Basophils Absolute: 0.1 10*3/uL (ref 0.0–0.1)
Basophils Relative: 2 %
Eosinophils Absolute: 0.1 10*3/uL (ref 0.0–0.5)
Eosinophils Relative: 1 %
HCT: 41.8 % (ref 36.0–46.0)
Hemoglobin: 12.8 g/dL (ref 12.0–15.0)
Immature Granulocytes: 0 %
Lymphocytes Relative: 30 %
Lymphs Abs: 1.4 10*3/uL (ref 0.7–4.0)
MCH: 26.8 pg (ref 26.0–34.0)
MCHC: 30.6 g/dL (ref 30.0–36.0)
MCV: 87.6 fL (ref 80.0–100.0)
Monocytes Absolute: 0.8 10*3/uL (ref 0.1–1.0)
Monocytes Relative: 17 %
Neutro Abs: 2.3 10*3/uL (ref 1.7–7.7)
Neutrophils Relative %: 50 %
Platelet Count: 265 10*3/uL (ref 150–400)
RBC: 4.77 MIL/uL (ref 3.87–5.11)
RDW: 23.6 % — ABNORMAL HIGH (ref 11.5–15.5)
WBC Count: 4.6 10*3/uL (ref 4.0–10.5)
nRBC: 0 % (ref 0.0–0.2)

## 2018-10-05 MED ORDER — MONTELUKAST SODIUM 10 MG PO TABS
10.0000 mg | ORAL_TABLET | Freq: Once | ORAL | Status: AC
  Administered 2018-10-05: 10 mg via ORAL

## 2018-10-05 MED ORDER — ACETAMINOPHEN 325 MG PO TABS
ORAL_TABLET | ORAL | Status: AC
  Filled 2018-10-05: qty 2

## 2018-10-05 MED ORDER — SODIUM CHLORIDE 0.9% FLUSH
10.0000 mL | INTRAVENOUS | Status: DC | PRN
  Administered 2018-10-05: 16:00:00 10 mL
  Filled 2018-10-05: qty 10

## 2018-10-05 MED ORDER — HEPARIN SOD (PORK) LOCK FLUSH 100 UNIT/ML IV SOLN
500.0000 [IU] | Freq: Once | INTRAVENOUS | Status: AC | PRN
  Administered 2018-10-05: 500 [IU]
  Filled 2018-10-05: qty 5

## 2018-10-05 MED ORDER — HEPARIN SOD (PORK) LOCK FLUSH 100 UNIT/ML IV SOLN
500.0000 [IU] | Freq: Once | INTRAVENOUS | Status: DC | PRN
  Filled 2018-10-05: qty 5

## 2018-10-05 MED ORDER — SODIUM CHLORIDE 0.9 % IV SOLN
20.0000 mg | Freq: Once | INTRAVENOUS | Status: AC
  Administered 2018-10-05: 13:00:00 20 mg via INTRAVENOUS
  Filled 2018-10-05: qty 2

## 2018-10-05 MED ORDER — SODIUM CHLORIDE 0.9% FLUSH
10.0000 mL | INTRAVENOUS | Status: DC | PRN
  Administered 2018-10-05: 10 mL
  Filled 2018-10-05: qty 10

## 2018-10-05 MED ORDER — MONTELUKAST SODIUM 10 MG PO TABS: 10.0000 mg | ORAL_TABLET | Freq: Every day | ORAL | 1 refills | Status: DC

## 2018-10-05 MED ORDER — FAMOTIDINE IN NACL 20-0.9 MG/50ML-% IV SOLN: 20.0000 mg | Freq: Once | INTRAVENOUS | Status: DC

## 2018-10-05 MED ORDER — SODIUM CHLORIDE 0.9 % IV SOLN
16.2000 mg/kg | Freq: Once | INTRAVENOUS | Status: AC
  Administered 2018-10-05: 14:00:00 1300 mg via INTRAVENOUS
  Filled 2018-10-05: qty 5

## 2018-10-05 MED ORDER — ALTEPLASE 2 MG IJ SOLR
2.0000 mg | Freq: Once | INTRAMUSCULAR | Status: DC | PRN
  Filled 2018-10-05: qty 2

## 2018-10-05 MED ORDER — ACETAMINOPHEN 325 MG PO TABS
650.0000 mg | ORAL_TABLET | Freq: Once | ORAL | Status: AC
  Administered 2018-10-05: 650 mg via ORAL

## 2018-10-05 MED ORDER — FERROUS GLUCONATE 324 (38 FE) MG PO TABS: ORAL_TABLET | ORAL | 1 refills | Status: DC

## 2018-10-05 MED ORDER — DIPHENHYDRAMINE HCL 25 MG PO CAPS
50.0000 mg | ORAL_CAPSULE | Freq: Once | ORAL | Status: AC
  Administered 2018-10-05: 12:00:00 50 mg via ORAL

## 2018-10-05 MED ORDER — SODIUM CHLORIDE 0.9 % IV SOLN
Freq: Once | INTRAVENOUS | Status: AC
  Administered 2018-10-05: 12:00:00 via INTRAVENOUS
  Filled 2018-10-05: qty 250

## 2018-10-05 MED ORDER — DIPHENHYDRAMINE HCL 25 MG PO CAPS
ORAL_CAPSULE | ORAL | Status: AC
  Filled 2018-10-05: qty 2

## 2018-10-05 MED ORDER — MONTELUKAST SODIUM 10 MG PO TABS
ORAL_TABLET | ORAL | Status: AC
  Filled 2018-10-05: qty 1

## 2018-10-05 MED ORDER — SODIUM CHLORIDE 0.9 % IV SOLN
20.0000 mg | Freq: Once | INTRAVENOUS | Status: AC
  Administered 2018-10-05: 20 mg via INTRAVENOUS
  Filled 2018-10-05: qty 2

## 2018-10-05 MED ORDER — ZOLEDRONIC ACID 4 MG/100ML IV SOLN
4.0000 mg | Freq: Once | INTRAVENOUS | Status: AC
  Administered 2018-10-05: 13:00:00 4 mg via INTRAVENOUS
  Filled 2018-10-05: qty 100

## 2018-10-05 NOTE — Patient Instructions (Signed)
[facility] Discharge Instructions for Patients Receiving Chemotherapy  Today you received the following chemotherapy agents Daratumumab(Darzalex)  To help prevent nausea and vomiting after your treatment, we encourage you to take your nausea medication as directed.    If you develop nausea and vomiting that is not controlled by your nausea medication, call the clinic.   BELOW ARE SYMPTOMS THAT SHOULD BE REPORTED IMMEDIATELY:  *FEVER GREATER THAN 100.5 F  *CHILLS WITH OR WITHOUT FEVER  NAUSEA AND VOMITING THAT IS NOT CONTROLLED WITH YOUR NAUSEA MEDICATION  *UNUSUAL SHORTNESS OF BREATH  *UNUSUAL BRUISING OR BLEEDING  TENDERNESS IN MOUTH AND THROAT WITH OR WITHOUT PRESENCE OF ULCERS  *URINARY PROBLEMS  *BOWEL PROBLEMS  UNUSUAL RASH Items with * indicate a potential emergency and should be followed up as soon as possible.  Feel free to call the clinic should you have any questions or concerns. The clinic phone number is [phone].  Please show the CHEMO ALERT CARD at check-in to the Emergency Department and triage nurse.   

## 2018-10-05 NOTE — Progress Notes (Signed)
[place] OFFICE PROGRESS NOTE   Diagnosis: Multiple myeloma  INTERVAL HISTORY:   [name] returns as scheduled.  She was last treated with daratumumab on 09/21/2018.  She is scheduled to begin monthly maintenance daratumumab today. She decided against stem cell therapy due to the current viral pandemic.  Dr. Norma Fredrickson has recommended continuing monthly maintenance daratumumab. [name] reports improvement in the cough.  She has mild exertional dyspnea.  No fever.  No other complaint.  No tooth or jaw pain.  Objective:  Vital signs in last 24 hours:  Blood pressure 125/73, pulse 96, temperature 98.8 F (37.1 C), temperature source Oral, resp. rate 19, height 5' 1"  (1.549 m), weight 186 lb 11.2 oz (84.7 kg), SpO2 99 %.    Portacath/PICC-without erythema  Lab Results:  Lab Results  Component Value Date   WBC 4.6 10/05/2018   HGB 12.8 10/05/2018   HCT 41.8 10/05/2018   MCV 87.6 10/05/2018   PLT 265 10/05/2018   NEUTROABS 2.3 10/05/2018    CMP  Lab Results  Component Value Date   NA 140 10/05/2018   K 4.2 10/05/2018   CL 104 10/05/2018   CO2 25 10/05/2018   GLUCOSE 97 10/05/2018   BUN 17 10/05/2018   CREATININE 0.76 10/05/2018   CALCIUM 9.5 10/05/2018   PROT 6.5 10/05/2018   ALBUMIN 3.6 10/05/2018   AST 31 10/05/2018   ALT 33 10/05/2018   ALKPHOS 54 10/05/2018   BILITOT 0.3 10/05/2018   GFRNONAA >60 10/05/2018   GFRAA >60 10/05/2018    Medications: I have reviewed the patient's current medications.   Assessment/Plan: 1. Multiple myeloma, IgA kappa diagnosed on bone marrow biopsy 09/22/2017  57% plasma cells on initial bone marrow biopsy, kappa light chain restricted  Normal cytogenetics, FISH with -4, -14, and 13q-abnormalities  PET scan 10/16/2017-negative for bone lesions  Cycle 1 RVD 10/23/2017  Cycle 2 RVD 11/13/2017  Cycle 3 RVD 12/04/2017 (Revlimid dose reduced to 15 mg)  Cycle 4 RVD 12/29/2017  Cycle 5 RVD 01/26/2018   Cycle 1 carfilzomib/Cytoxan/Decadron 03/02/2018 (day 16 treatment held secondary to patient preference)  Cycle2carfilzomib/Cytoxan/Decadron 03/30/2018  Improvement, but persistent elevation of the kappa/lambda ratio, transplant team recommended changing treatment to daratumumab/pomalidomide/Decadron  Cycle 1 daratumumab/pomalidomide/Decadron beginning 05/11/2018 (day 22 daratumumab held secondary to an upper respiratory infection)  Cycle 2 daratumumab/pomalidomide/Decadron beginning 06/08/2018  IgA level and serum light chains improved December 2019  Cycle 3 daratumumab/pomalidomide/Decadron beginning 07/06/2018  Cycle 4 daratumumab/pomalidomide/Decadron 08/03/2018  Bone marrow biopsy 08/18/2018- 1% plasma cells, reduced iron stores  Cycle 5 daratumumab/pomalidomide/Decadron 09/07/2018  Monthly maintenance daratumumab beginning 10/05/2018  2.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 2.4 cm solid right thyroid mass, follow-up ultrasound recommended  Biopsy 01/14/2018-atypia of undetermined significance or follicular lesion of undetermined significance  Affirmatesting- likely a benign lesion  6.Right rib fractures 12/21/2017  7.  Cough/dyspnea- potentially related to toxicity from daratumumab, daily Singulair started 07/13/2018-improved    Disposition: [name] appears stable.  The hemoglobin is normal.  We will follow-up on the markers of myeloma today.  She is in clinical remission from multiple myeloma.  The plan is to continue monthly daratumumab.  She appears to be tolerating the daratumumab well. [name] will be treated with daratumumab  and Zometa today.  She asked  me to consult with Dr. Norma Fredrickson regarding the interval of Zometa therapy.  She will be scheduled for daratumumab in 1 month.  She will return for office visit in 2 months.  She does not wish to undergo a GI evaluation for the iron deficiency anemia.  We will plan for a metastatic bone survey within the next few months.  25 minutes were spent with the patient today.  The majority of the time was used for counseling and coordination of care.  Betsy Coder, MD  10/05/2018  11:06 AM

## 2018-10-05 NOTE — Telephone Encounter (Signed)
Scheduled appt per 4/7 los. 

## 2018-10-06 ENCOUNTER — Telehealth: Payer: Self-pay | Admitting: *Deleted

## 2018-10-06 LAB — KAPPA/LAMBDA LIGHT CHAINS
Kappa free light chain: 10.3 mg/L (ref 3.3–19.4)
Kappa, lambda light chain ratio: 3.22 — ABNORMAL HIGH (ref 0.26–1.65)
Lambda free light chains: 3.2 mg/L — ABNORMAL LOW (ref 5.7–26.3)

## 2018-10-06 LAB — PROTEIN ELECTROPHORESIS, SERUM
A/G Ratio: 1.5 (ref 0.7–1.7)
Albumin ELP: 3.7 g/dL (ref 2.9–4.4)
Alpha-1-Globulin: 0.3 g/dL (ref 0.0–0.4)
Alpha-2-Globulin: 1 g/dL (ref 0.4–1.0)
Beta Globulin: 0.9 g/dL (ref 0.7–1.3)
Gamma Globulin: 0.3 g/dL — ABNORMAL LOW (ref 0.4–1.8)
Globulin, Total: 2.4 g/dL (ref 2.2–3.9)
M-Spike, %: 0.1 g/dL — ABNORMAL HIGH
Total Protein ELP: 6.1 g/dL (ref 6.0–8.5)

## 2018-10-06 LAB — IGA: IgA: 17 mg/dL — ABNORMAL LOW (ref 87–352)

## 2018-10-06 NOTE — Telephone Encounter (Signed)
Spoke with Dr. Norma Fredrickson and he agrees she will be fine getting Zometa every 3 months. She will call patient so she knows this is per Dr. Norma Fredrickson.

## 2018-10-14 ENCOUNTER — Encounter: Payer: Self-pay | Admitting: General Practice

## 2018-10-14 NOTE — Progress Notes (Signed)
[facility] Support Services   [facility] Support Services Team contacted patient to assess for food insecurity and other psychosocial needs during current COVID19 pandemic.    Patient/family expressed no needs at this time.  Support Team member encouraged patient to call if changes occur or they have any other questions/concerns.    Urias Sheek C Roisin Mones, LCSW   Cancer Center        

## 2018-10-31 ENCOUNTER — Other Ambulatory Visit: Payer: Self-pay | Admitting: Oncology

## 2018-11-02 ENCOUNTER — Inpatient Hospital Stay: Payer: PPO | Attending: Oncology

## 2018-11-02 ENCOUNTER — Inpatient Hospital Stay: Payer: PPO

## 2018-11-02 ENCOUNTER — Other Ambulatory Visit: Payer: Self-pay

## 2018-11-02 VITALS — BP 113/65 | HR 88 | Temp 98.9°F | Resp 18

## 2018-11-02 DIAGNOSIS — Z853 Personal history of malignant neoplasm of breast: Secondary | ICD-10-CM | POA: Insufficient documentation

## 2018-11-02 DIAGNOSIS — Z5111 Encounter for antineoplastic chemotherapy: Secondary | ICD-10-CM | POA: Diagnosis not present

## 2018-11-02 DIAGNOSIS — C9000 Multiple myeloma not having achieved remission: Secondary | ICD-10-CM

## 2018-11-02 DIAGNOSIS — F329 Major depressive disorder, single episode, unspecified: Secondary | ICD-10-CM | POA: Insufficient documentation

## 2018-11-02 DIAGNOSIS — D509 Iron deficiency anemia, unspecified: Secondary | ICD-10-CM | POA: Diagnosis not present

## 2018-11-02 DIAGNOSIS — Z79899 Other long term (current) drug therapy: Secondary | ICD-10-CM | POA: Diagnosis not present

## 2018-11-02 DIAGNOSIS — Z95828 Presence of other vascular implants and grafts: Secondary | ICD-10-CM

## 2018-11-02 DIAGNOSIS — D61818 Other pancytopenia: Secondary | ICD-10-CM

## 2018-11-02 LAB — CBC WITH DIFFERENTIAL (CANCER CENTER ONLY)
Abs Immature Granulocytes: 0.03 10*3/uL (ref 0.00–0.07)
Basophils Absolute: 0 10*3/uL (ref 0.0–0.1)
Basophils Relative: 0 %
Eosinophils Absolute: 0.1 10*3/uL (ref 0.0–0.5)
Eosinophils Relative: 1 %
HCT: 44.1 % (ref 36.0–46.0)
Hemoglobin: 13.7 g/dL (ref 12.0–15.0)
Immature Granulocytes: 0 %
Lymphocytes Relative: 14 %
Lymphs Abs: 1.3 10*3/uL (ref 0.7–4.0)
MCH: 28.2 pg (ref 26.0–34.0)
MCHC: 31.1 g/dL (ref 30.0–36.0)
MCV: 90.9 fL (ref 80.0–100.0)
Monocytes Absolute: 0.7 10*3/uL (ref 0.1–1.0)
Monocytes Relative: 7 %
Neutro Abs: 7.7 10*3/uL (ref 1.7–7.7)
Neutrophils Relative %: 78 %
Platelet Count: 181 10*3/uL (ref 150–400)
RBC: 4.85 MIL/uL (ref 3.87–5.11)
RDW: 19.3 % — ABNORMAL HIGH (ref 11.5–15.5)
WBC Count: 9.8 10*3/uL (ref 4.0–10.5)
nRBC: 0 % (ref 0.0–0.2)

## 2018-11-02 LAB — CMP (CANCER CENTER ONLY)
ALT: 32 U/L (ref 0–44)
AST: 36 U/L (ref 15–41)
Albumin: 3.8 g/dL (ref 3.5–5.0)
Alkaline Phosphatase: 60 U/L (ref 38–126)
Anion gap: 11 (ref 5–15)
BUN: 11 mg/dL (ref 8–23)
CO2: 23 mmol/L (ref 22–32)
Calcium: 8.4 mg/dL — ABNORMAL LOW (ref 8.9–10.3)
Chloride: 102 mmol/L (ref 98–111)
Creatinine: 0.72 mg/dL (ref 0.44–1.00)
GFR, Est AFR Am: >60 mL/min (ref >60)
GFR, Estimated: >60 mL/min (ref >60)
Glucose, Bld: 105 mg/dL — ABNORMAL HIGH (ref 70–99)
Potassium: 4.5 mmol/L (ref 3.5–5.1)
Sodium: 136 mmol/L (ref 135–145)
Total Bilirubin: 0.3 mg/dL (ref 0.3–1.2)
Total Protein: 6.4 g/dL — ABNORMAL LOW (ref 6.5–8.1)

## 2018-11-02 MED ORDER — SODIUM CHLORIDE 0.9% FLUSH
10.0000 mL | INTRAVENOUS | Status: DC | PRN
  Administered 2018-11-02: 10 mL
  Filled 2018-11-02: qty 10

## 2018-11-02 MED ORDER — HEPARIN SOD (PORK) LOCK FLUSH 100 UNIT/ML IV SOLN
500.0000 [IU] | Freq: Once | INTRAVENOUS | Status: AC | PRN
  Administered 2018-11-02: 500 [IU]
  Filled 2018-11-02: qty 5

## 2018-11-02 MED ORDER — FAMOTIDINE IN NACL 20-0.9 MG/50ML-% IV SOLN
INTRAVENOUS | Status: AC
  Filled 2018-11-02: qty 50

## 2018-11-02 MED ORDER — MONTELUKAST SODIUM 10 MG PO TABS
ORAL_TABLET | ORAL | Status: AC
  Filled 2018-11-02: qty 1

## 2018-11-02 MED ORDER — MONTELUKAST SODIUM 10 MG PO TABS
10.0000 mg | ORAL_TABLET | Freq: Once | ORAL | Status: AC
  Administered 2018-11-02: 12:00:00 10 mg via ORAL

## 2018-11-02 MED ORDER — SODIUM CHLORIDE 0.9 % IV SOLN
Freq: Once | INTRAVENOUS | Status: AC
  Administered 2018-11-02: 12:00:00 via INTRAVENOUS
  Filled 2018-11-02: qty 250

## 2018-11-02 MED ORDER — SODIUM CHLORIDE 0.9 % IV SOLN
16.2000 mg/kg | Freq: Once | INTRAVENOUS | Status: AC
  Administered 2018-11-02: 1300 mg via INTRAVENOUS
  Filled 2018-11-02: qty 60

## 2018-11-02 MED ORDER — FAMOTIDINE IN NACL 20-0.9 MG/50ML-% IV SOLN
20.0000 mg | Freq: Once | INTRAVENOUS | Status: AC
  Administered 2018-11-02: 20 mg via INTRAVENOUS

## 2018-11-02 MED ORDER — ACETAMINOPHEN 325 MG PO TABS
650.0000 mg | ORAL_TABLET | Freq: Once | ORAL | Status: AC
  Administered 2018-11-02: 650 mg via ORAL

## 2018-11-02 MED ORDER — SODIUM CHLORIDE 0.9 % IV SOLN
20.0000 mg | Freq: Once | INTRAVENOUS | Status: AC
  Administered 2018-11-02: 12:00:00 20 mg via INTRAVENOUS
  Filled 2018-11-02: qty 20

## 2018-11-02 MED ORDER — DIPHENHYDRAMINE HCL 25 MG PO CAPS
ORAL_CAPSULE | ORAL | Status: AC
  Filled 2018-11-02: qty 2

## 2018-11-02 MED ORDER — DIPHENHYDRAMINE HCL 25 MG PO CAPS
50.0000 mg | ORAL_CAPSULE | Freq: Once | ORAL | Status: AC
  Administered 2018-11-02: 50 mg via ORAL

## 2018-11-02 MED ORDER — ACETAMINOPHEN 325 MG PO TABS
ORAL_TABLET | ORAL | Status: AC
  Filled 2018-11-02: qty 2

## 2018-11-02 NOTE — Patient Instructions (Signed)
Coronavirus (COVID-19) Are you at risk?  Are you at risk for the Coronavirus (COVID-19)?  To be considered HIGH RISK for Coronavirus (COVID-19), you have to meet the following criteria:  . Traveled to [place], [place], [place], [place] or [place]; or in the [place] to [place], [place], [place], or [place]; and have fever, cough, and shortness of breath within the last 2 weeks of travel OR . Been in close contact with a person diagnosed with COVID-19 within the last 2 weeks and have fever, cough, and shortness of breath . IF YOU DO NOT MEET THESE CRITERIA, YOU ARE CONSIDERED LOW RISK FOR COVID-19.  What to do if you are HIGH RISK for COVID-19?  . If you are having a medical emergency, call 911. . Seek medical care right away. Before you go to a doctor's office, urgent care or emergency department, call ahead and tell them about your recent travel, contact with someone diagnosed with COVID-19, and your symptoms. You should receive instructions from your physician's office regarding next steps of care.  . When you arrive at healthcare provider, tell the healthcare staff immediately you have returned from visiting [place], [place], [place], [place] or [place]; or traveled in the [place] to [place], [place], [place], or [place]; in the last two weeks or you have been in close contact with a person diagnosed with COVID-19 in the last 2 weeks.   . Tell the health care staff about your symptoms: fever, cough and shortness of breath. . After you have been seen by a medical provider, you will be either: o Tested for (COVID-19) and discharged home on quarantine except to seek medical care if symptoms worsen, and asked to  - Stay home and avoid contact with others until you get your results (4-5 days)  - Avoid travel on public transportation if possible (such as bus, train, or airplane) or o Sent to the Emergency Department by EMS for evaluation, COVID-19 testing, and possible  admission depending on your condition and test results.  What to do if you are LOW RISK for COVID-19?  Reduce your risk of any infection by using the same precautions used for avoiding the common cold or flu:  . Wash your hands often with soap and warm water for at least 20 seconds.  If soap and water are not readily available, use an alcohol-based hand sanitizer with at least 60% alcohol.  . If coughing or sneezing, cover your mouth and nose by coughing or sneezing into the elbow areas of your shirt or coat, into a tissue or into your sleeve (not your hands). . Avoid shaking hands with others and consider head nods or verbal greetings only. . Avoid touching your eyes, nose, or mouth with unwashed hands.  . Avoid close contact with people who are sick. . Avoid places or events with large numbers of people in one location, like concerts or sporting events. . Carefully consider travel plans you have or are making. . If you are planning any travel outside or inside the [place], visit the CDC's Travelers' Health webpage for the latest health notices. . If you have some symptoms but not all symptoms, continue to monitor at home and seek medical attention if your symptoms worsen. . If you are having a medical emergency, call 911.   ADDITIONAL HEALTHCARE OPTIONS FOR PATIENTS  [facility] / e-Visit: [email or web address]         [facility]: [phone]  [facility]: [phone]                   [facility]: [phone]   [facility] Discharge Instructions for Patients Receiving Chemotherapy  Today you received the following chemotherapy agents Darzalex  To help prevent nausea and vomiting after your treatment, we encourage you to take your nausea medication as directed.    If you develop nausea and vomiting that is not controlled by your nausea medication, call the clinic.   BELOW ARE  SYMPTOMS THAT SHOULD BE REPORTED IMMEDIATELY:  *FEVER GREATER THAN 100.5 F  *CHILLS WITH OR WITHOUT FEVER  NAUSEA AND VOMITING THAT IS NOT CONTROLLED WITH YOUR NAUSEA MEDICATION  *UNUSUAL SHORTNESS OF BREATH  *UNUSUAL BRUISING OR BLEEDING  TENDERNESS IN MOUTH AND THROAT WITH OR WITHOUT PRESENCE OF ULCERS  *URINARY PROBLEMS  *BOWEL PROBLEMS  UNUSUAL RASH Items with * indicate a potential emergency and should be followed up as soon as possible.  Feel free to call the clinic should you have any questions or concerns. The clinic phone number is [phone].  Please show the CHEMO ALERT CARD at check-in to the Emergency Department and triage nurse.   

## 2018-11-19 DIAGNOSIS — G4700 Insomnia, unspecified: Secondary | ICD-10-CM | POA: Diagnosis not present

## 2018-11-19 DIAGNOSIS — G894 Chronic pain syndrome: Secondary | ICD-10-CM | POA: Diagnosis not present

## 2018-11-19 DIAGNOSIS — M4807 Spinal stenosis, lumbosacral region: Secondary | ICD-10-CM | POA: Diagnosis not present

## 2018-11-19 DIAGNOSIS — M797 Fibromyalgia: Secondary | ICD-10-CM | POA: Diagnosis not present

## 2018-11-19 DIAGNOSIS — M47817 Spondylosis without myelopathy or radiculopathy, lumbosacral region: Secondary | ICD-10-CM | POA: Diagnosis not present

## 2018-11-19 DIAGNOSIS — F329 Major depressive disorder, single episode, unspecified: Secondary | ICD-10-CM | POA: Diagnosis not present

## 2018-11-19 DIAGNOSIS — Z79891 Long term (current) use of opiate analgesic: Secondary | ICD-10-CM | POA: Diagnosis not present

## 2018-11-26 DIAGNOSIS — F5101 Primary insomnia: Secondary | ICD-10-CM | POA: Diagnosis not present

## 2018-11-26 DIAGNOSIS — F411 Generalized anxiety disorder: Secondary | ICD-10-CM | POA: Diagnosis not present

## 2018-11-26 DIAGNOSIS — F3341 Major depressive disorder, recurrent, in partial remission: Secondary | ICD-10-CM | POA: Diagnosis not present

## 2018-11-28 ENCOUNTER — Other Ambulatory Visit: Payer: Self-pay | Admitting: Oncology

## 2018-11-30 ENCOUNTER — Inpatient Hospital Stay: Payer: PPO | Attending: Hematology and Oncology

## 2018-11-30 ENCOUNTER — Inpatient Hospital Stay: Payer: PPO

## 2018-11-30 ENCOUNTER — Inpatient Hospital Stay (HOSPITAL_BASED_OUTPATIENT_CLINIC_OR_DEPARTMENT_OTHER): Payer: PPO | Admitting: Oncology

## 2018-11-30 ENCOUNTER — Other Ambulatory Visit: Payer: Self-pay

## 2018-11-30 ENCOUNTER — Telehealth: Payer: Self-pay | Admitting: Oncology

## 2018-11-30 VITALS — BP 130/80 | HR 105 | Temp 97.4°F | Resp 17 | Ht 61.0 in | Wt 186.1 lb

## 2018-11-30 VITALS — BP 124/69 | HR 88 | Temp 98.6°F | Resp 16

## 2018-11-30 DIAGNOSIS — Z5112 Encounter for antineoplastic immunotherapy: Secondary | ICD-10-CM | POA: Diagnosis not present

## 2018-11-30 DIAGNOSIS — Z95828 Presence of other vascular implants and grafts: Secondary | ICD-10-CM

## 2018-11-30 DIAGNOSIS — C9000 Multiple myeloma not having achieved remission: Secondary | ICD-10-CM | POA: Diagnosis not present

## 2018-11-30 LAB — CBC WITH DIFFERENTIAL (CANCER CENTER ONLY)
Abs Immature Granulocytes: 0.01 10*3/uL (ref 0.00–0.07)
Basophils Absolute: 0 10*3/uL (ref 0.0–0.1)
Basophils Relative: 0 %
Eosinophils Absolute: 0.1 10*3/uL (ref 0.0–0.5)
Eosinophils Relative: 1 %
HCT: 45.2 % (ref 36.0–46.0)
Hemoglobin: 14.3 g/dL (ref 12.0–15.0)
Immature Granulocytes: 0 %
Lymphocytes Relative: 21 %
Lymphs Abs: 1 10*3/uL (ref 0.7–4.0)
MCH: 28.8 pg (ref 26.0–34.0)
MCHC: 31.6 g/dL (ref 30.0–36.0)
MCV: 91.1 fL (ref 80.0–100.0)
Monocytes Absolute: 0.3 10*3/uL (ref 0.1–1.0)
Monocytes Relative: 6 %
Neutro Abs: 3.5 10*3/uL (ref 1.7–7.7)
Neutrophils Relative %: 72 %
Platelet Count: 160 10*3/uL (ref 150–400)
RBC: 4.96 MIL/uL (ref 3.87–5.11)
RDW: 15.7 % — ABNORMAL HIGH (ref 11.5–15.5)
WBC Count: 4.9 10*3/uL (ref 4.0–10.5)
nRBC: 0 % (ref 0.0–0.2)

## 2018-11-30 LAB — CMP (CANCER CENTER ONLY)
ALT: 38 U/L (ref 0–44)
AST: 36 U/L (ref 15–41)
Albumin: 3.6 g/dL (ref 3.5–5.0)
Alkaline Phosphatase: 46 U/L (ref 38–126)
Anion gap: 9 (ref 5–15)
BUN: 8 mg/dL (ref 8–23)
CO2: 24 mmol/L (ref 22–32)
Calcium: 9.1 mg/dL (ref 8.9–10.3)
Chloride: 102 mmol/L (ref 98–111)
Creatinine: 0.7 mg/dL (ref 0.44–1.00)
GFR, Est AFR Am: >60 mL/min (ref >60)
GFR, Estimated: >60 mL/min (ref >60)
Glucose, Bld: 131 mg/dL — ABNORMAL HIGH (ref 70–99)
Potassium: 4.3 mmol/L (ref 3.5–5.1)
Sodium: 135 mmol/L (ref 135–145)
Total Bilirubin: 0.3 mg/dL (ref 0.3–1.2)
Total Protein: 6 g/dL — ABNORMAL LOW (ref 6.5–8.1)

## 2018-11-30 MED ORDER — SODIUM CHLORIDE 0.9 % IV SOLN
1300.0000 mg | Freq: Once | INTRAVENOUS | Status: AC
  Administered 2018-11-30: 14:00:00 1300 mg via INTRAVENOUS
  Filled 2018-11-30: qty 60

## 2018-11-30 MED ORDER — ALBUTEROL SULFATE HFA 108 (90 BASE) MCG/ACT IN AERS: 2.0000 | INHALATION_SPRAY | Freq: Four times a day (QID) | RESPIRATORY_TRACT | 1 refills | Status: AC | PRN

## 2018-11-30 MED ORDER — FERROUS GLUCONATE 324 (38 FE) MG PO TABS: ORAL_TABLET | ORAL | 0 refills | Status: AC

## 2018-11-30 MED ORDER — HEPARIN SOD (PORK) LOCK FLUSH 100 UNIT/ML IV SOLN
500.0000 [IU] | Freq: Once | INTRAVENOUS | Status: AC | PRN
  Administered 2018-11-30: 16:00:00 500 [IU]
  Filled 2018-11-30: qty 5

## 2018-11-30 MED ORDER — SODIUM CHLORIDE 0.9% FLUSH
10.0000 mL | INTRAVENOUS | Status: DC | PRN
  Administered 2018-11-30: 10 mL
  Filled 2018-11-30: qty 10

## 2018-11-30 MED ORDER — SODIUM CHLORIDE 0.9 % IV SOLN
20.0000 mg | Freq: Once | INTRAVENOUS | Status: AC
  Administered 2018-11-30: 13:00:00 20 mg via INTRAVENOUS
  Filled 2018-11-30: qty 2

## 2018-11-30 MED ORDER — CHOLESTYRAMINE 4 GM/DOSE PO POWD: 4.0000 g | Freq: Two times a day (BID) | ORAL | 11 refills | Status: AC

## 2018-11-30 MED ORDER — SODIUM CHLORIDE 0.9% FLUSH
10.0000 mL | INTRAVENOUS | Status: DC | PRN
  Administered 2018-11-30: 16:00:00 10 mL
  Filled 2018-11-30: qty 10

## 2018-11-30 MED ORDER — SODIUM CHLORIDE 0.9 % IV SOLN
Freq: Once | INTRAVENOUS | Status: AC
  Administered 2018-11-30: 12:00:00 via INTRAVENOUS
  Filled 2018-11-30: qty 250

## 2018-11-30 MED ORDER — MONTELUKAST SODIUM 10 MG PO TABS
10.0000 mg | ORAL_TABLET | Freq: Once | ORAL | Status: AC
  Administered 2018-11-30: 10 mg via ORAL

## 2018-11-30 MED ORDER — DIPHENHYDRAMINE HCL 25 MG PO CAPS
50.0000 mg | ORAL_CAPSULE | Freq: Once | ORAL | Status: AC
  Administered 2018-11-30: 12:00:00 50 mg via ORAL

## 2018-11-30 MED ORDER — MONTELUKAST SODIUM 10 MG PO TABS
ORAL_TABLET | ORAL | Status: AC
  Filled 2018-11-30: qty 1

## 2018-11-30 MED ORDER — FAMOTIDINE IN NACL 20-0.9 MG/50ML-% IV SOLN
20.0000 mg | Freq: Once | INTRAVENOUS | Status: AC
  Administered 2018-11-30: 12:00:00 20 mg via INTRAVENOUS

## 2018-11-30 MED ORDER — ACETAMINOPHEN 325 MG PO TABS
650.0000 mg | ORAL_TABLET | Freq: Once | ORAL | Status: AC
  Administered 2018-11-30: 12:00:00 650 mg via ORAL

## 2018-11-30 MED ORDER — DIPHENHYDRAMINE HCL 25 MG PO CAPS
ORAL_CAPSULE | ORAL | Status: AC
  Filled 2018-11-30: qty 2

## 2018-11-30 MED ORDER — FAMOTIDINE IN NACL 20-0.9 MG/50ML-% IV SOLN
INTRAVENOUS | Status: AC
  Filled 2018-11-30: qty 50

## 2018-11-30 MED ORDER — ACETAMINOPHEN 325 MG PO TABS
ORAL_TABLET | ORAL | Status: AC
  Filled 2018-11-30: qty 2

## 2018-11-30 NOTE — Telephone Encounter (Signed)
Scheduled appt per 6/2 los. °

## 2018-11-30 NOTE — Progress Notes (Signed)
  [place] OFFICE PROGRESS NOTE   Diagnosis: Multiple myeloma  INTERVAL HISTORY:   [name] returns as scheduled.  She was last treated with daratumumab on 11/02/2018.  No symptom of allergic reaction.  She continues to have a mild intermittent cough.  No dyspnea.  No pain.  She has decided against stem cell therapy until there is a COVID vaccine.  Objective:  Vital signs in last 24 hours:  Blood pressure 130/80, pulse (!) 105, temperature (!) 97.4 F (36.3 C), temperature source Oral, resp. rate 17, height 5' 1" (1.549 m), weight 186 lb 1.6 oz (84.4 kg), SpO2 98 %.    Physical examination-not performed today secondary to distancing with the COVID pandemic  Portacath/PICC-without erythema  Lab Results:  Lab Results  Component Value Date   WBC 4.9 11/30/2018   HGB 14.3 11/30/2018   HCT 45.2 11/30/2018   MCV 91.1 11/30/2018   PLT 160 11/30/2018   NEUTROABS 3.5 11/30/2018    CMP  Lab Results  Component Value Date   NA 135 11/30/2018   K 4.3 11/30/2018   CL 102 11/30/2018   CO2 24 11/30/2018   GLUCOSE 131 (H) 11/30/2018   BUN 8 11/30/2018   CREATININE 0.70 11/30/2018   CALCIUM 9.1 11/30/2018   PROT 6.0 (L) 11/30/2018   ALBUMIN 3.6 11/30/2018   AST 36 11/30/2018   ALT 38 11/30/2018   ALKPHOS 46 11/30/2018   BILITOT 0.3 11/30/2018   GFRNONAA >60 11/30/2018   GFRAA >60 11/30/2018    Medications: I have reviewed the patient's current medications.   Assessment/Plan: 1. Multiple myeloma, IgA kappa diagnosed on bone marrow biopsy 09/22/2017  57% plasma cells on initial bone marrow biopsy, kappa light chain restricted  Normal cytogenetics, FISH with -4, -14, and 13q-abnormalities  PET scan 10/16/2017-negative for bone lesions  Cycle 1 RVD 10/23/2017  Cycle 2 RVD 11/13/2017  Cycle 3 RVD 12/04/2017 (Revlimid dose reduced to 15 mg)  Cycle 4 RVD 12/29/2017  Cycle 5 RVD 01/26/2018  Cycle 1 carfilzomib/Cytoxan/Decadron 03/02/2018 (day 16 treatment  held secondary to patient preference)  Cycle2carfilzomib/Cytoxan/Decadron 03/30/2018  Improvement, but persistent elevation of the kappa/lambda ratio, transplant team recommended changing treatment to daratumumab/pomalidomide/Decadron  Cycle 1 daratumumab/pomalidomide/Decadron beginning 05/11/2018 (day 22 daratumumab held secondary to an upper respiratory infection)  Cycle 2 daratumumab/pomalidomide/Decadron beginning 06/08/2018  IgA level and serum light chains improved December 2019  Cycle 3 daratumumab/pomalidomide/Decadron beginning 07/06/2018  Cycle 4 daratumumab/pomalidomide/Decadron 08/03/2018  Bone marrow biopsy 08/18/2018- 1% plasma cells, reduced iron stores  Cycle 5 daratumumab/pomalidomide/Decadron 09/07/2018  Monthly maintenance daratumumab beginning 10/05/2018  2.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 2.4 cm solid right thyroid mass, follow-up ultrasound recommended  Biopsy 01/14/2018-atypia of undetermined significance or follicular lesion of undetermined significance  Affirmatesting- likely a benign lesion  6.Right rib fractures 12/21/2017  7.  Cough/dyspnea- potentially related to toxicity from daratumumab, daily Singulair started 07/13/2018-improved    Disposition: [name] appears well.  The plan is to continue monthly daratumumab.  She is maintained on every 11-monthZometa.  We will follow-up on the myeloma panel from today. She will return for an office visit and daratumumab in 4 weeks.  GBetsy Coder MD  11/30/2018  12:05 PM

## 2018-11-30 NOTE — Patient Instructions (Signed)
[facility] Discharge Instructions for Patients Receiving Chemotherapy  Today you received the following chemotherapy agents: Darzalex  To help prevent nausea and vomiting after your treatment, we encourage you to take your nausea medication as directed.    If you develop nausea and vomiting that is not controlled by your nausea medication, call the clinic.   BELOW ARE SYMPTOMS THAT SHOULD BE REPORTED IMMEDIATELY:  *FEVER GREATER THAN 100.5 F  *CHILLS WITH OR WITHOUT FEVER  NAUSEA AND VOMITING THAT IS NOT CONTROLLED WITH YOUR NAUSEA MEDICATION  *UNUSUAL SHORTNESS OF BREATH  *UNUSUAL BRUISING OR BLEEDING  TENDERNESS IN MOUTH AND THROAT WITH OR WITHOUT PRESENCE OF ULCERS  *URINARY PROBLEMS  *BOWEL PROBLEMS  UNUSUAL RASH Items with * indicate a potential emergency and should be followed up as soon as possible.  Feel free to call the clinic should you have any questions or concerns. The clinic phone number is [phone].  Please show the CHEMO ALERT CARD at check-in to the Emergency Department and triage nurse.   

## 2018-12-01 LAB — PROTEIN ELECTROPHORESIS, SERUM
A/G Ratio: 1.5 (ref 0.7–1.7)
Albumin ELP: 3.4 g/dL (ref 2.9–4.4)
Alpha-1-Globulin: 0.2 g/dL (ref 0.0–0.4)
Alpha-2-Globulin: 1 g/dL (ref 0.4–1.0)
Beta Globulin: 0.8 g/dL (ref 0.7–1.3)
Gamma Globulin: 0.3 g/dL — ABNORMAL LOW (ref 0.4–1.8)
Globulin, Total: 2.3 g/dL (ref 2.2–3.9)
M-Spike, %: 0.1 g/dL — ABNORMAL HIGH
Total Protein ELP: 5.7 g/dL — ABNORMAL LOW (ref 6.0–8.5)

## 2018-12-01 LAB — KAPPA/LAMBDA LIGHT CHAINS
Kappa free light chain: 8.7 mg/L (ref 3.3–19.4)
Kappa, lambda light chain ratio: 4.14 — ABNORMAL HIGH (ref 0.26–1.65)
Lambda free light chains: 2.1 mg/L — ABNORMAL LOW (ref 5.7–26.3)

## 2018-12-01 LAB — IGA: IgA: 14 mg/dL — ABNORMAL LOW (ref 87–352)

## 2018-12-02 ENCOUNTER — Telehealth: Payer: Self-pay | Admitting: *Deleted

## 2018-12-02 NOTE — Telephone Encounter (Signed)
-----   Message from Ladell Pier, MD sent at 12/02/2018  6:58 AM EDT ----- Please call patient, kappa light chains are normal, f/u as scheduled

## 2018-12-02 NOTE — Telephone Encounter (Signed)
Left patient detailed voice mail with her SPEP and light chain results. Call back if any further questions.

## 2018-12-10 ENCOUNTER — Encounter: Payer: Self-pay | Admitting: Oncology

## 2018-12-20 DIAGNOSIS — M4807 Spinal stenosis, lumbosacral region: Secondary | ICD-10-CM | POA: Diagnosis not present

## 2018-12-20 DIAGNOSIS — M47817 Spondylosis without myelopathy or radiculopathy, lumbosacral region: Secondary | ICD-10-CM | POA: Diagnosis not present

## 2018-12-20 DIAGNOSIS — F329 Major depressive disorder, single episode, unspecified: Secondary | ICD-10-CM | POA: Diagnosis not present

## 2018-12-20 DIAGNOSIS — G894 Chronic pain syndrome: Secondary | ICD-10-CM | POA: Diagnosis not present

## 2018-12-20 DIAGNOSIS — Z79891 Long term (current) use of opiate analgesic: Secondary | ICD-10-CM | POA: Diagnosis not present

## 2018-12-20 DIAGNOSIS — G4700 Insomnia, unspecified: Secondary | ICD-10-CM | POA: Diagnosis not present

## 2018-12-20 DIAGNOSIS — M797 Fibromyalgia: Secondary | ICD-10-CM | POA: Diagnosis not present

## 2018-12-22 ENCOUNTER — Telehealth: Payer: Self-pay | Admitting: *Deleted

## 2018-12-22 NOTE — Telephone Encounter (Signed)
Called to inquire when was her next infusion appointment and what is her blood type? Informed her that tx is 12/28/18 with lab at 11:00. She is A + blood type.

## 2018-12-25 ENCOUNTER — Other Ambulatory Visit: Payer: Self-pay | Admitting: Oncology

## 2018-12-27 ENCOUNTER — Other Ambulatory Visit: Payer: Self-pay | Admitting: Lab

## 2018-12-28 ENCOUNTER — Inpatient Hospital Stay: Payer: PPO

## 2018-12-28 ENCOUNTER — Encounter: Payer: Self-pay | Admitting: Nurse Practitioner

## 2018-12-28 ENCOUNTER — Other Ambulatory Visit: Payer: Self-pay

## 2018-12-28 ENCOUNTER — Inpatient Hospital Stay (HOSPITAL_BASED_OUTPATIENT_CLINIC_OR_DEPARTMENT_OTHER): Payer: PPO | Admitting: Nurse Practitioner

## 2018-12-28 VITALS — BP 113/76 | HR 77 | Temp 97.9°F | Resp 18

## 2018-12-28 DIAGNOSIS — C9000 Multiple myeloma not having achieved remission: Secondary | ICD-10-CM

## 2018-12-28 DIAGNOSIS — Z5112 Encounter for antineoplastic immunotherapy: Secondary | ICD-10-CM | POA: Diagnosis not present

## 2018-12-28 DIAGNOSIS — Z95828 Presence of other vascular implants and grafts: Secondary | ICD-10-CM

## 2018-12-28 LAB — CMP (CANCER CENTER ONLY)
ALT: 32 U/L (ref 0–44)
AST: 30 U/L (ref 15–41)
Albumin: 2.8 g/dL — ABNORMAL LOW (ref 3.5–5.0)
Alkaline Phosphatase: 42 U/L (ref 38–126)
Anion gap: 6 (ref 5–15)
BUN: 12 mg/dL (ref 8–23)
CO2: 21 mmol/L — ABNORMAL LOW (ref 22–32)
Calcium: 6.7 mg/dL — ABNORMAL LOW (ref 8.9–10.3)
Chloride: 112 mmol/L — ABNORMAL HIGH (ref 98–111)
Creatinine: 0.56 mg/dL (ref 0.44–1.00)
GFR, Est AFR Am: >60 mL/min (ref >60)
GFR, Estimated: >60 mL/min (ref >60)
Glucose, Bld: 82 mg/dL (ref 70–99)
Potassium: 3.6 mmol/L (ref 3.5–5.1)
Sodium: 139 mmol/L (ref 135–145)
Total Bilirubin: 0.3 mg/dL (ref 0.3–1.2)
Total Protein: 4.6 g/dL — ABNORMAL LOW (ref 6.5–8.1)

## 2018-12-28 LAB — CBC WITH DIFFERENTIAL (CANCER CENTER ONLY)
Abs Immature Granulocytes: 0.02 10*3/uL (ref 0.00–0.07)
Basophils Absolute: 0 10*3/uL (ref 0.0–0.1)
Basophils Relative: 0 %
Eosinophils Absolute: 0.2 10*3/uL (ref 0.0–0.5)
Eosinophils Relative: 3 %
HCT: 35.9 % — ABNORMAL LOW (ref 36.0–46.0)
Hemoglobin: 11.5 g/dL — ABNORMAL LOW (ref 12.0–15.0)
Immature Granulocytes: 0 %
Lymphocytes Relative: 24 %
Lymphs Abs: 1.4 10*3/uL (ref 0.7–4.0)
MCH: 29.8 pg (ref 26.0–34.0)
MCHC: 32 g/dL (ref 30.0–36.0)
MCV: 93 fL (ref 80.0–100.0)
Monocytes Absolute: 0.4 10*3/uL (ref 0.1–1.0)
Monocytes Relative: 7 %
Neutro Abs: 4 10*3/uL (ref 1.7–7.7)
Neutrophils Relative %: 66 %
Platelet Count: 153 10*3/uL (ref 150–400)
RBC: 3.86 MIL/uL — ABNORMAL LOW (ref 3.87–5.11)
RDW: 15.6 % — ABNORMAL HIGH (ref 11.5–15.5)
WBC Count: 6.1 10*3/uL (ref 4.0–10.5)
nRBC: 0 % (ref 0.0–0.2)

## 2018-12-28 MED ORDER — FAMOTIDINE IN NACL 20-0.9 MG/50ML-% IV SOLN
20.0000 mg | Freq: Once | INTRAVENOUS | Status: AC
  Administered 2018-12-28: 20 mg via INTRAVENOUS

## 2018-12-28 MED ORDER — DIPHENHYDRAMINE HCL 25 MG PO CAPS
ORAL_CAPSULE | ORAL | Status: AC
  Filled 2018-12-28: qty 1

## 2018-12-28 MED ORDER — MONTELUKAST SODIUM 10 MG PO TABS
ORAL_TABLET | ORAL | Status: AC
  Filled 2018-12-28: qty 1

## 2018-12-28 MED ORDER — FAMOTIDINE IN NACL 20-0.9 MG/50ML-% IV SOLN
INTRAVENOUS | Status: AC
  Filled 2018-12-28: qty 50

## 2018-12-28 MED ORDER — SODIUM CHLORIDE 0.9 % IV SOLN
20.0000 mg | Freq: Once | INTRAVENOUS | Status: AC
  Administered 2018-12-28: 20 mg via INTRAVENOUS
  Filled 2018-12-28: qty 2

## 2018-12-28 MED ORDER — SODIUM CHLORIDE 0.9 % IV SOLN
16.2000 mg/kg | Freq: Once | INTRAVENOUS | Status: AC
  Administered 2018-12-28: 15:00:00 1300 mg via INTRAVENOUS
  Filled 2018-12-28: qty 60

## 2018-12-28 MED ORDER — SODIUM CHLORIDE 0.9% FLUSH
10.0000 mL | INTRAVENOUS | Status: DC | PRN
  Administered 2018-12-28: 10 mL
  Filled 2018-12-28: qty 10

## 2018-12-28 MED ORDER — DIPHENHYDRAMINE HCL 25 MG PO CAPS
ORAL_CAPSULE | ORAL | Status: AC
  Filled 2018-12-28: qty 2

## 2018-12-28 MED ORDER — ACETAMINOPHEN 325 MG PO TABS
650.0000 mg | ORAL_TABLET | Freq: Once | ORAL | Status: AC
  Administered 2018-12-28: 13:00:00 650 mg via ORAL

## 2018-12-28 MED ORDER — HEPARIN SOD (PORK) LOCK FLUSH 100 UNIT/ML IV SOLN
500.0000 [IU] | Freq: Once | INTRAVENOUS | Status: AC | PRN
  Administered 2018-12-28: 500 [IU]
  Filled 2018-12-28: qty 5

## 2018-12-28 MED ORDER — ACETAMINOPHEN 325 MG PO TABS
ORAL_TABLET | ORAL | Status: AC
  Filled 2018-12-28: qty 2

## 2018-12-28 MED ORDER — MONTELUKAST SODIUM 10 MG PO TABS
10.0000 mg | ORAL_TABLET | Freq: Once | ORAL | Status: AC
  Administered 2018-12-28: 13:00:00 10 mg via ORAL

## 2018-12-28 MED ORDER — DEXAMETHASONE 4 MG PO TABS: ORAL_TABLET | ORAL | 3 refills | Status: DC

## 2018-12-28 MED ORDER — DIPHENHYDRAMINE HCL 25 MG PO CAPS
50.0000 mg | ORAL_CAPSULE | Freq: Once | ORAL | Status: AC
  Administered 2018-12-28: 13:00:00 50 mg via ORAL

## 2018-12-28 MED ORDER — SODIUM CHLORIDE 0.9 % IV SOLN
Freq: Once | INTRAVENOUS | Status: AC
  Administered 2018-12-28: 13:00:00 via INTRAVENOUS
  Filled 2018-12-28: qty 250

## 2018-12-28 NOTE — Patient Instructions (Signed)

## 2018-12-28 NOTE — Progress Notes (Signed)
  [place] OFFICE PROGRESS NOTE   Diagnosis: Multiple myeloma  INTERVAL HISTORY:   Ms. Groesbeck returns as scheduled.  She continues monthly daratumumab, most recent cycle completed 11/30/2018.  She denies nausea/vomiting.  No mouth sores.  No diarrhea.  No fever or shortness of breath.  She has a slight cough occasionally.  No bleeding.  Objective:  Vital signs in last 24 hours:  Blood pressure 118/82, pulse 90, temperature 98.9 F (37.2 C), temperature source Oral, resp. rate 18, height 5' 1" (1.549 m), weight 189 lb 4.8 oz (85.9 kg), SpO2 99 %.   Limited exam due to COVID-19 GI: Abdomen soft and nontender.  No hepatosplenomegaly. Vascular: No leg edema. Port-A-Cath without erythema.  Lab Results:  Lab Results  Component Value Date   WBC 6.1 12/28/2018   HGB 11.5 (L) 12/28/2018   HCT 35.9 (L) 12/28/2018   MCV 93.0 12/28/2018   PLT 153 12/28/2018   NEUTROABS 4.0 12/28/2018    Imaging:  No results found.  Medications: I have reviewed the patient's current medications.  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 09/22/2017  57% plasma cells on initial bone marrow biopsy, kappa light chain restricted  Normal cytogenetics, FISH with -4, -14, and 13q-abnormalities  PET scan 10/16/2017-negative for bone lesions  Cycle 1 RVD 10/23/2017  Cycle 2 RVD 11/13/2017  Cycle 3 RVD 12/04/2017 (Revlimid dose reduced to 15 mg)  Cycle 4 RVD 12/29/2017  Cycle 5 RVD 01/26/2018  Cycle 1 carfilzomib/Cytoxan/Decadron 03/02/2018 (day 16 treatment held secondary to patient preference)  Cycle2carfilzomib/Cytoxan/Decadron 03/30/2018  Improvement, but persistent elevation of the kappa/lambda ratio, transplant team recommended changing treatment to daratumumab/pomalidomide/Decadron  Cycle 1 daratumumab/pomalidomide/Decadron beginning 05/11/2018 (day 22 daratumumab held secondary to an upper respiratory infection)  Cycle 2  daratumumab/pomalidomide/Decadron beginning 06/08/2018  IgA level and serum light chains improved December 2019  Cycle 3 daratumumab/pomalidomide/Decadron beginning 07/06/2018  Cycle 4 daratumumab/pomalidomide/Decadron 08/03/2018  Bone marrow biopsy 08/18/2018- 1% plasma cells, reduced iron stores  Cycle 5 daratumumab/pomalidomide/Decadron 09/07/2018  Monthly maintenance daratumumab beginning 10/05/2018  2.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 2.4 cm solid right thyroid mass, follow-up ultrasound recommended  Biopsy 01/14/2018-atypia of undetermined significance or follicular lesion of undetermined significance  Affirmatesting- likely a benign lesion  6.Right rib fractures 12/21/2017  7. Cough/dyspnea- potentially related to toxicity from daratumumab, daily Singulair started 07/13/2018-improved    Disposition: Ms. Deleeuw appears stable.  The plan is to continue monthly daratumumab.  We reviewed the CBC from today.  Counts adequate to proceed with treatment.  She will return for lab, follow-up, daratumumab in 1 month.  She will contact the office in the interim with any problems.  Plan reviewed with Dr. Benay Spice.    Ned Card ANP/GNP-BC   12/28/2018  12:43 PM

## 2018-12-29 ENCOUNTER — Telehealth: Payer: Self-pay | Admitting: Nurse Practitioner

## 2018-12-29 LAB — PROTEIN ELECTROPHORESIS, SERUM
A/G Ratio: 1.9 — ABNORMAL HIGH (ref 0.7–1.7)
Albumin ELP: 3.7 g/dL (ref 2.9–4.4)
Alpha-1-Globulin: 0.2 g/dL (ref 0.0–0.4)
Alpha-2-Globulin: 0.9 g/dL (ref 0.4–1.0)
Beta Globulin: 0.7 g/dL (ref 0.7–1.3)
Gamma Globulin: 0.2 g/dL — ABNORMAL LOW (ref 0.4–1.8)
Globulin, Total: 2 g/dL — ABNORMAL LOW (ref 2.2–3.9)
M-Spike, %: 0.1 g/dL — ABNORMAL HIGH
Total Protein ELP: 5.7 g/dL — ABNORMAL LOW (ref 6.0–8.5)

## 2018-12-29 LAB — KAPPA/LAMBDA LIGHT CHAINS
Kappa free light chain: 9 mg/L (ref 3.3–19.4)
Kappa, lambda light chain ratio: 3.46 — ABNORMAL HIGH (ref 0.26–1.65)
Lambda free light chains: 2.6 mg/L — ABNORMAL LOW (ref 5.7–26.3)

## 2018-12-29 LAB — IGA: IgA: 14 mg/dL — ABNORMAL LOW (ref 87–352)

## 2018-12-29 NOTE — Telephone Encounter (Signed)
Scheduled per los. Mailed printout  °

## 2019-01-23 ENCOUNTER — Other Ambulatory Visit: Payer: Self-pay | Admitting: Oncology

## 2019-01-25 ENCOUNTER — Inpatient Hospital Stay: Payer: PPO

## 2019-01-25 ENCOUNTER — Ambulatory Visit (HOSPITAL_COMMUNITY)
Admission: RE | Admit: 2019-01-25 | Discharge: 2019-01-25 | Disposition: A | Discharge status: Home / Self Care | Payer: PPO | Source: Ambulatory Visit | Attending: Oncology | Admitting: Oncology

## 2019-01-25 ENCOUNTER — Inpatient Hospital Stay (HOSPITAL_BASED_OUTPATIENT_CLINIC_OR_DEPARTMENT_OTHER): Payer: PPO | Admitting: Oncology

## 2019-01-25 ENCOUNTER — Inpatient Hospital Stay: Payer: PPO | Attending: Oncology

## 2019-01-25 ENCOUNTER — Other Ambulatory Visit: Payer: Self-pay

## 2019-01-25 ENCOUNTER — Other Ambulatory Visit: Payer: Self-pay | Admitting: *Deleted

## 2019-01-25 VITALS — BP 119/69 | HR 74 | Temp 98.4°F | Resp 18

## 2019-01-25 VITALS — BP 149/83 | HR 101 | Resp 98 | Ht 61.0 in | Wt 189.4 lb

## 2019-01-25 DIAGNOSIS — Z79899 Other long term (current) drug therapy: Secondary | ICD-10-CM

## 2019-01-25 DIAGNOSIS — Z853 Personal history of malignant neoplasm of breast: Secondary | ICD-10-CM | POA: Insufficient documentation

## 2019-01-25 DIAGNOSIS — C9000 Multiple myeloma not having achieved remission: Secondary | ICD-10-CM

## 2019-01-25 DIAGNOSIS — Z5112 Encounter for antineoplastic immunotherapy: Secondary | ICD-10-CM | POA: Insufficient documentation

## 2019-01-25 DIAGNOSIS — F329 Major depressive disorder, single episode, unspecified: Secondary | ICD-10-CM

## 2019-01-25 DIAGNOSIS — D509 Iron deficiency anemia, unspecified: Secondary | ICD-10-CM | POA: Insufficient documentation

## 2019-01-25 DIAGNOSIS — Z95828 Presence of other vascular implants and grafts: Secondary | ICD-10-CM

## 2019-01-25 LAB — CBC WITH DIFFERENTIAL (CANCER CENTER ONLY)
Abs Immature Granulocytes: 0.01 10*3/uL (ref 0.00–0.07)
Basophils Absolute: 0 10*3/uL (ref 0.0–0.1)
Basophils Relative: 0 %
Eosinophils Absolute: 0.1 10*3/uL (ref 0.0–0.5)
Eosinophils Relative: 1 %
HCT: 41.7 % (ref 36.0–46.0)
Hemoglobin: 13.8 g/dL (ref 12.0–15.0)
Immature Granulocytes: 0 %
Lymphocytes Relative: 24 %
Lymphs Abs: 1.4 10*3/uL (ref 0.7–4.0)
MCH: 30.1 pg (ref 26.0–34.0)
MCHC: 33.1 g/dL (ref 30.0–36.0)
MCV: 91 fL (ref 80.0–100.0)
Monocytes Absolute: 0.4 10*3/uL (ref 0.1–1.0)
Monocytes Relative: 7 %
Neutro Abs: 3.7 10*3/uL (ref 1.7–7.7)
Neutrophils Relative %: 68 %
Platelet Count: 174 10*3/uL (ref 150–400)
RBC: 4.58 MIL/uL (ref 3.87–5.11)
RDW: 14.8 % (ref 11.5–15.5)
WBC Count: 5.6 10*3/uL (ref 4.0–10.5)
nRBC: 0 % (ref 0.0–0.2)

## 2019-01-25 LAB — CMP (CANCER CENTER ONLY)
ALT: 51 U/L — ABNORMAL HIGH (ref 0–44)
AST: 46 U/L — ABNORMAL HIGH (ref 15–41)
Albumin: 3.5 g/dL (ref 3.5–5.0)
Alkaline Phosphatase: 59 U/L (ref 38–126)
Anion gap: 10 (ref 5–15)
BUN: 14 mg/dL (ref 8–23)
CO2: 22 mmol/L (ref 22–32)
Calcium: 8.7 mg/dL — ABNORMAL LOW (ref 8.9–10.3)
Chloride: 104 mmol/L (ref 98–111)
Creatinine: 0.69 mg/dL (ref 0.44–1.00)
GFR, Est AFR Am: >60 mL/min (ref >60)
GFR, Estimated: >60 mL/min (ref >60)
Glucose, Bld: 113 mg/dL — ABNORMAL HIGH (ref 70–99)
Potassium: 4.2 mmol/L (ref 3.5–5.1)
Sodium: 136 mmol/L (ref 135–145)
Total Bilirubin: 0.5 mg/dL (ref 0.3–1.2)
Total Protein: 6 g/dL — ABNORMAL LOW (ref 6.5–8.1)

## 2019-01-25 MED ORDER — DIPHENHYDRAMINE HCL 25 MG PO CAPS
50.0000 mg | ORAL_CAPSULE | Freq: Once | ORAL | Status: AC
  Administered 2019-01-25: 11:00:00 50 mg via ORAL

## 2019-01-25 MED ORDER — MONTELUKAST SODIUM 10 MG PO TABS
10.0000 mg | ORAL_TABLET | Freq: Once | ORAL | Status: AC
  Administered 2019-01-25: 10 mg via ORAL

## 2019-01-25 MED ORDER — SODIUM CHLORIDE 0.9% FLUSH
10.0000 mL | INTRAVENOUS | Status: DC | PRN
  Administered 2019-01-25: 15:00:00 10 mL
  Filled 2019-01-25: qty 10

## 2019-01-25 MED ORDER — ZOLEDRONIC ACID 4 MG/100ML IV SOLN
4.0000 mg | Freq: Once | INTRAVENOUS | Status: AC
  Administered 2019-01-25: 4 mg via INTRAVENOUS
  Filled 2019-01-25: qty 100

## 2019-01-25 MED ORDER — FAMOTIDINE IN NACL 20-0.9 MG/50ML-% IV SOLN
INTRAVENOUS | Status: AC
  Filled 2019-01-25: qty 50

## 2019-01-25 MED ORDER — SODIUM CHLORIDE 0.9 % IV SOLN
Freq: Once | INTRAVENOUS | Status: AC
  Administered 2019-01-25: 11:00:00 via INTRAVENOUS
  Filled 2019-01-25: qty 250

## 2019-01-25 MED ORDER — FAMOTIDINE IN NACL 20-0.9 MG/50ML-% IV SOLN
20.0000 mg | Freq: Once | INTRAVENOUS | Status: AC
  Administered 2019-01-25: 20 mg via INTRAVENOUS

## 2019-01-25 MED ORDER — ACETAMINOPHEN 325 MG PO TABS
650.0000 mg | ORAL_TABLET | Freq: Once | ORAL | Status: AC
  Administered 2019-01-25: 11:00:00 650 mg via ORAL

## 2019-01-25 MED ORDER — MONTELUKAST SODIUM 10 MG PO TABS
ORAL_TABLET | ORAL | Status: AC
  Filled 2019-01-25: qty 1

## 2019-01-25 MED ORDER — SODIUM CHLORIDE 0.9 % IV SOLN
20.0000 mg | Freq: Once | INTRAVENOUS | Status: AC
  Administered 2019-01-25: 11:00:00 20 mg via INTRAVENOUS
  Filled 2019-01-25: qty 2

## 2019-01-25 MED ORDER — ACETAMINOPHEN 325 MG PO TABS
ORAL_TABLET | ORAL | Status: AC
  Filled 2019-01-25: qty 2

## 2019-01-25 MED ORDER — DIPHENHYDRAMINE HCL 25 MG PO CAPS
ORAL_CAPSULE | ORAL | Status: AC
  Filled 2019-01-25: qty 2

## 2019-01-25 MED ORDER — HEPARIN SOD (PORK) LOCK FLUSH 100 UNIT/ML IV SOLN
500.0000 [IU] | Freq: Once | INTRAVENOUS | Status: AC | PRN
  Administered 2019-01-25: 15:00:00 500 [IU]
  Filled 2019-01-25: qty 5

## 2019-01-25 MED ORDER — SODIUM CHLORIDE 0.9% FLUSH
10.0000 mL | INTRAVENOUS | Status: DC | PRN
  Administered 2019-01-25: 10 mL
  Filled 2019-01-25: qty 10

## 2019-01-25 MED ORDER — SODIUM CHLORIDE 0.9 % IV SOLN
1300.0000 mg | Freq: Once | INTRAVENOUS | Status: AC
  Administered 2019-01-25: 13:00:00 1300 mg via INTRAVENOUS
  Filled 2019-01-25: qty 60

## 2019-01-25 NOTE — Progress Notes (Signed)
[place] OFFICE PROGRESS NOTE   Diagnosis: Multiple myeloma  INTERVAL HISTORY:   [name] returns as scheduled.  She feels well.  The cough is much improved.  She has noted swelling in the left ankle for the past several weeks.  No pain.  Objective:  Vital signs in last 24 hours:  Blood pressure (!) 149/83, pulse (!) 101, resp. rate (!) 98, height 5' 1"  (1.549 m), weight 189 lb 6.4 oz (85.9 kg), SpO2 100 %.    Limited physical examination secondary to distancing with the COVID pandemic Vascular: Slight enlargement of the left compared to the right lower leg.  No erythema or palpable cord.  No tenderness.  The asymmetry extends to the ankle  Portacath/PICC-without erythema  Lab Results:  Lab Results  Component Value Date   WBC 5.6 01/25/2019   HGB 13.8 01/25/2019   HCT 41.7 01/25/2019   MCV 91.0 01/25/2019   PLT 174 01/25/2019   NEUTROABS 3.7 01/25/2019    CMP  Lab Results  Component Value Date   NA 139 12/28/2018   K 3.6 12/28/2018   CL 112 (H) 12/28/2018   CO2 21 (L) 12/28/2018   GLUCOSE 82 12/28/2018   BUN 12 12/28/2018   CREATININE 0.56 12/28/2018   CALCIUM 6.7 (L) 12/28/2018   PROT 4.6 (L) 12/28/2018   ALBUMIN 2.8 (L) 12/28/2018   AST 30 12/28/2018   ALT 32 12/28/2018   ALKPHOS 42 12/28/2018   BILITOT 0.3 12/28/2018   GFRNONAA >60 12/28/2018   GFRAA >60 12/28/2018     Medications: I have reviewed the patient's current medications.   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 09/22/2017  57% plasma cells on initial bone marrow biopsy, kappa light chain restricted  Normal cytogenetics, FISH with -4, -14, and 13q-abnormalities  PET scan 10/16/2017-negative for bone lesions  Cycle 1 RVD 10/23/2017  Cycle 2 RVD 11/13/2017  Cycle 3 RVD 12/04/2017 (Revlimid dose reduced to 15 mg)  Cycle 4 RVD 12/29/2017  Cycle 5 RVD 01/26/2018  Cycle 1 carfilzomib/Cytoxan/Decadron 03/02/2018 (day 16 treatment  held secondary to patient preference)  Cycle2carfilzomib/Cytoxan/Decadron 03/30/2018  Improvement, but persistent elevation of the kappa/lambda ratio, transplant team recommended changing treatment to daratumumab/pomalidomide/Decadron  Cycle 1 daratumumab/pomalidomide/Decadron beginning 05/11/2018 (day 22 daratumumab held secondary to an upper respiratory infection)  Cycle 2 daratumumab/pomalidomide/Decadron beginning 06/08/2018  IgA level and serum light chains improved December 2019  Cycle 3 daratumumab/pomalidomide/Decadron beginning 07/06/2018  Cycle 4 daratumumab/pomalidomide/Decadron 08/03/2018  Bone marrow biopsy 08/18/2018- 1% plasma cells, reduced iron stores  Cycle 5 daratumumab/pomalidomide/Decadron 09/07/2018  Monthly maintenance daratumumab beginning 10/05/2018  2.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 2.4 cm solid right thyroid mass, follow-up ultrasound recommended  Biopsy 01/14/2018-atypia of undetermined significance or follicular lesion of undetermined significance  Affirmatesting- likely a benign lesion  6.Right rib fractures 12/21/2017  7. Cough/dyspnea- potentially related to toxicity from daratumumab, daily Singulair started 07/13/2018-improved   Disposition: [name] is tolerating the daratumumab well.  She is in clinical remission from myeloma.  We will follow-up on the myeloma panel from today.  She will receive Zometa today.  [name] will return for an office and lab visit in 1 month.  We have noted enlargement of the left lower  leg in the past.  However she is at increased risk for venous thrombosis.  We will refer her for a Doppler of  the left lower extremity.    Betsy Coder, MD  01/25/2019  10:20 AM

## 2019-01-25 NOTE — Patient Instructions (Signed)
[facility] Discharge Instructions for Patients Receiving Chemotherapy  Today you received the following chemotherapy agents: Darzalex  To help prevent nausea and vomiting after your treatment, we encourage you to take your nausea medication as directed.    If you develop nausea and vomiting that is not controlled by your nausea medication, call the clinic.   BELOW ARE SYMPTOMS THAT SHOULD BE REPORTED IMMEDIATELY:  *FEVER GREATER THAN 100.5 F  *CHILLS WITH OR WITHOUT FEVER  NAUSEA AND VOMITING THAT IS NOT CONTROLLED WITH YOUR NAUSEA MEDICATION  *UNUSUAL SHORTNESS OF BREATH  *UNUSUAL BRUISING OR BLEEDING  TENDERNESS IN MOUTH AND THROAT WITH OR WITHOUT PRESENCE OF ULCERS  *URINARY PROBLEMS  *BOWEL PROBLEMS  UNUSUAL RASH Items with * indicate a potential emergency and should be followed up as soon as possible.  Feel free to call the clinic should you have any questions or concerns. The clinic phone number is [phone].  Please show the CHEMO ALERT CARD at check-in to the Emergency Department and triage nurse.   

## 2019-01-25 NOTE — Progress Notes (Signed)
Lower extremity venous has been completed.   Preliminary results in CV Proc.   Abram Sander 01/25/2019 3:19 PM

## 2019-01-26 ENCOUNTER — Telehealth: Payer: Self-pay | Admitting: Oncology

## 2019-01-26 NOTE — Telephone Encounter (Signed)
Scheduled per los  °

## 2019-01-28 DIAGNOSIS — F3341 Major depressive disorder, recurrent, in partial remission: Secondary | ICD-10-CM | POA: Diagnosis not present

## 2019-01-28 DIAGNOSIS — F411 Generalized anxiety disorder: Secondary | ICD-10-CM | POA: Diagnosis not present

## 2019-01-28 DIAGNOSIS — F5101 Primary insomnia: Secondary | ICD-10-CM | POA: Diagnosis not present

## 2019-01-31 DIAGNOSIS — G894 Chronic pain syndrome: Secondary | ICD-10-CM | POA: Diagnosis not present

## 2019-01-31 DIAGNOSIS — Z79891 Long term (current) use of opiate analgesic: Secondary | ICD-10-CM | POA: Diagnosis not present

## 2019-01-31 DIAGNOSIS — M47817 Spondylosis without myelopathy or radiculopathy, lumbosacral region: Secondary | ICD-10-CM | POA: Diagnosis not present

## 2019-01-31 DIAGNOSIS — M79674 Pain in right toe(s): Secondary | ICD-10-CM | POA: Diagnosis not present

## 2019-01-31 DIAGNOSIS — M4807 Spinal stenosis, lumbosacral region: Secondary | ICD-10-CM | POA: Diagnosis not present

## 2019-01-31 DIAGNOSIS — M79671 Pain in right foot: Secondary | ICD-10-CM | POA: Diagnosis not present

## 2019-01-31 DIAGNOSIS — F329 Major depressive disorder, single episode, unspecified: Secondary | ICD-10-CM | POA: Diagnosis not present

## 2019-01-31 DIAGNOSIS — M7989 Other specified soft tissue disorders: Secondary | ICD-10-CM | POA: Diagnosis not present

## 2019-02-20 ENCOUNTER — Other Ambulatory Visit: Payer: Self-pay | Admitting: Oncology

## 2019-02-22 ENCOUNTER — Inpatient Hospital Stay: Payer: PPO

## 2019-02-22 ENCOUNTER — Inpatient Hospital Stay: Payer: PPO | Attending: Oncology | Admitting: Oncology

## 2019-02-22 ENCOUNTER — Other Ambulatory Visit: Payer: Self-pay

## 2019-02-22 VITALS — BP 106/69 | HR 89 | Temp 98.2°F | Resp 18

## 2019-02-22 VITALS — BP 128/75 | HR 106 | Temp 97.8°F | Resp 17 | Ht 61.0 in | Wt 190.4 lb

## 2019-02-22 DIAGNOSIS — D509 Iron deficiency anemia, unspecified: Secondary | ICD-10-CM | POA: Insufficient documentation

## 2019-02-22 DIAGNOSIS — F329 Major depressive disorder, single episode, unspecified: Secondary | ICD-10-CM | POA: Insufficient documentation

## 2019-02-22 DIAGNOSIS — Z79899 Other long term (current) drug therapy: Secondary | ICD-10-CM | POA: Insufficient documentation

## 2019-02-22 DIAGNOSIS — Z5112 Encounter for antineoplastic immunotherapy: Secondary | ICD-10-CM | POA: Insufficient documentation

## 2019-02-22 DIAGNOSIS — C9000 Multiple myeloma not having achieved remission: Secondary | ICD-10-CM

## 2019-02-22 DIAGNOSIS — Z853 Personal history of malignant neoplasm of breast: Secondary | ICD-10-CM | POA: Insufficient documentation

## 2019-02-22 DIAGNOSIS — Z95828 Presence of other vascular implants and grafts: Secondary | ICD-10-CM

## 2019-02-22 LAB — CMP (CANCER CENTER ONLY)
ALT: 60 U/L — ABNORMAL HIGH (ref 0–44)
AST: 57 U/L — ABNORMAL HIGH (ref 15–41)
Albumin: 3.7 g/dL (ref 3.5–5.0)
Alkaline Phosphatase: 71 U/L (ref 38–126)
Anion gap: 12 (ref 5–15)
BUN: 13 mg/dL (ref 8–23)
CO2: 23 mmol/L (ref 22–32)
Calcium: 9 mg/dL (ref 8.9–10.3)
Chloride: 105 mmol/L (ref 98–111)
Creatinine: 0.76 mg/dL (ref 0.44–1.00)
GFR, Est AFR Am: >60 mL/min (ref >60)
GFR, Estimated: >60 mL/min (ref >60)
Glucose, Bld: 108 mg/dL — ABNORMAL HIGH (ref 70–99)
Potassium: 4.4 mmol/L (ref 3.5–5.1)
Sodium: 140 mmol/L (ref 135–145)
Total Bilirubin: 0.3 mg/dL (ref 0.3–1.2)
Total Protein: 6.1 g/dL — ABNORMAL LOW (ref 6.5–8.1)

## 2019-02-22 LAB — CBC WITH DIFFERENTIAL (CANCER CENTER ONLY)
Abs Immature Granulocytes: 0.02 10*3/uL (ref 0.00–0.07)
Basophils Absolute: 0 10*3/uL (ref 0.0–0.1)
Basophils Relative: 0 %
Eosinophils Absolute: 0.1 10*3/uL (ref 0.0–0.5)
Eosinophils Relative: 1 %
HCT: 39.6 % (ref 36.0–46.0)
Hemoglobin: 12.7 g/dL (ref 12.0–15.0)
Immature Granulocytes: 0 %
Lymphocytes Relative: 23 %
Lymphs Abs: 1.4 10*3/uL (ref 0.7–4.0)
MCH: 30 pg (ref 26.0–34.0)
MCHC: 32.1 g/dL (ref 30.0–36.0)
MCV: 93.6 fL (ref 80.0–100.0)
Monocytes Absolute: 0.5 10*3/uL (ref 0.1–1.0)
Monocytes Relative: 9 %
Neutro Abs: 3.9 10*3/uL (ref 1.7–7.7)
Neutrophils Relative %: 67 %
Platelet Count: 193 10*3/uL (ref 150–400)
RBC: 4.23 MIL/uL (ref 3.87–5.11)
RDW: 14.4 % (ref 11.5–15.5)
WBC Count: 5.9 10*3/uL (ref 4.0–10.5)
nRBC: 0 % (ref 0.0–0.2)

## 2019-02-22 MED ORDER — MONTELUKAST SODIUM 10 MG PO TABS
ORAL_TABLET | ORAL | Status: AC
  Filled 2019-02-22: qty 1

## 2019-02-22 MED ORDER — SODIUM CHLORIDE 0.9% FLUSH
10.0000 mL | INTRAVENOUS | Status: DC | PRN
  Administered 2019-02-22: 10 mL
  Filled 2019-02-22: qty 10

## 2019-02-22 MED ORDER — ACETAMINOPHEN 325 MG PO TABS
ORAL_TABLET | ORAL | Status: AC
  Filled 2019-02-22: qty 2

## 2019-02-22 MED ORDER — MONTELUKAST SODIUM 10 MG PO TABS
10.0000 mg | ORAL_TABLET | Freq: Once | ORAL | Status: AC
  Administered 2019-02-22: 11:00:00 10 mg via ORAL

## 2019-02-22 MED ORDER — ACETAMINOPHEN 325 MG PO TABS
650.0000 mg | ORAL_TABLET | Freq: Once | ORAL | Status: AC
  Administered 2019-02-22: 11:00:00 650 mg via ORAL

## 2019-02-22 MED ORDER — FAMOTIDINE IN NACL 20-0.9 MG/50ML-% IV SOLN
20.0000 mg | Freq: Once | INTRAVENOUS | Status: AC
  Administered 2019-02-22: 11:00:00 20 mg via INTRAVENOUS

## 2019-02-22 MED ORDER — FAMOTIDINE IN NACL 20-0.9 MG/50ML-% IV SOLN
INTRAVENOUS | Status: AC
  Filled 2019-02-22: qty 50

## 2019-02-22 MED ORDER — SODIUM CHLORIDE 0.9 % IV SOLN
Freq: Once | INTRAVENOUS | Status: AC
  Administered 2019-02-22: 11:00:00 via INTRAVENOUS
  Filled 2019-02-22: qty 250

## 2019-02-22 MED ORDER — SODIUM CHLORIDE 0.9 % IV SOLN
20.0000 mg | Freq: Once | INTRAVENOUS | Status: AC
  Administered 2019-02-22: 12:00:00 20 mg via INTRAVENOUS
  Filled 2019-02-22: qty 20

## 2019-02-22 MED ORDER — SODIUM CHLORIDE 0.9% FLUSH
10.0000 mL | INTRAVENOUS | Status: DC | PRN
  Administered 2019-02-22: 15:00:00 10 mL
  Filled 2019-02-22: qty 10

## 2019-02-22 MED ORDER — SODIUM CHLORIDE 0.9 % IV SOLN
16.2000 mg/kg | Freq: Once | INTRAVENOUS | Status: AC
  Administered 2019-02-22: 13:00:00 1300 mg via INTRAVENOUS
  Filled 2019-02-22: qty 60

## 2019-02-22 MED ORDER — DIPHENHYDRAMINE HCL 25 MG PO CAPS
ORAL_CAPSULE | ORAL | Status: AC
  Filled 2019-02-22: qty 2

## 2019-02-22 MED ORDER — DIPHENHYDRAMINE HCL 25 MG PO CAPS
50.0000 mg | ORAL_CAPSULE | Freq: Once | ORAL | Status: AC
  Administered 2019-02-22: 11:00:00 50 mg via ORAL

## 2019-02-22 MED ORDER — HEPARIN SOD (PORK) LOCK FLUSH 100 UNIT/ML IV SOLN
500.0000 [IU] | Freq: Once | INTRAVENOUS | Status: AC | PRN
  Administered 2019-02-22: 15:00:00 500 [IU]
  Filled 2019-02-22: qty 5

## 2019-02-22 NOTE — Patient Instructions (Signed)
[facility] Discharge Instructions for Patients Receiving Chemotherapy  Today you received the following chemotherapy agents: Darzalex  To help prevent nausea and vomiting after your treatment, we encourage you to take your nausea medication as directed.    If you develop nausea and vomiting that is not controlled by your nausea medication, call the clinic.   BELOW ARE SYMPTOMS THAT SHOULD BE REPORTED IMMEDIATELY:  *FEVER GREATER THAN 100.5 F  *CHILLS WITH OR WITHOUT FEVER  NAUSEA AND VOMITING THAT IS NOT CONTROLLED WITH YOUR NAUSEA MEDICATION  *UNUSUAL SHORTNESS OF BREATH  *UNUSUAL BRUISING OR BLEEDING  TENDERNESS IN MOUTH AND THROAT WITH OR WITHOUT PRESENCE OF ULCERS  *URINARY PROBLEMS  *BOWEL PROBLEMS  UNUSUAL RASH Items with * indicate a potential emergency and should be followed up as soon as possible.  Feel free to call the clinic should you have any questions or concerns. The clinic phone number is [phone].  Please show the CHEMO ALERT CARD at check-in to the Emergency Department and triage nurse.   

## 2019-02-22 NOTE — Patient Instructions (Signed)

## 2019-02-22 NOTE — Progress Notes (Signed)
[place] OFFICE PROGRESS NOTE   Diagnosis: Multiple myeloma  INTERVAL HISTORY:   [name] returns as scheduled.  She feels well.  She has an intermittent cough, but this has significantly improved.  No other complaint.  Objective:  Vital signs in last 24 hours:  Blood pressure 128/75, pulse (!) 106, temperature 97.8 F (36.6 C), temperature source Oral, resp. rate 17, height _0  (1.549 m), weight 190 lb 6.4 oz (86.4 kg), SpO2 97 %.    HEENT: No thrush Resp: Lungs clear bilaterally Cardio: Regular rate and rhythm Vascular: No leg edema, left lower leg is larger than the right side   Portacath/PICC-without erythema  Lab Results:  Lab Results  Component Value Date   WBC 5.9 02/22/2019   HGB 12.7 02/22/2019   HCT 39.6 02/22/2019   MCV 93.6 02/22/2019   PLT 193 02/22/2019   NEUTROABS 3.9 02/22/2019    CMP  Lab Results  Component Value Date   NA 140 02/22/2019   K 4.4 02/22/2019   CL 105 02/22/2019   CO2 23 02/22/2019   GLUCOSE 108 (H) 02/22/2019   BUN 13 02/22/2019   CREATININE 0.76 02/22/2019   CALCIUM 9.0 02/22/2019   PROT 6.1 (L) 02/22/2019   ALBUMIN 3.7 02/22/2019   AST 57 (H) 02/22/2019   ALT 60 (H) 02/22/2019   ALKPHOS 71 02/22/2019   BILITOT 0.3 02/22/2019   GFRNONAA >60 02/22/2019   GFRAA >60 02/22/2019     Medications: I have reviewed the patient's current medications.   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 09/22/2017  57% plasma cells on initial bone marrow biopsy, kappa light chain restricted  Normal cytogenetics, FISH with -4, -14, and 13q-abnormalities  PET scan 10/16/2017-negative for bone lesions  Cycle 1 RVD 10/23/2017  Cycle 2 RVD 11/13/2017  Cycle 3 RVD 12/04/2017 (Revlimid dose reduced to 15 mg)  Cycle 4 RVD 12/29/2017  Cycle 5 RVD 01/26/2018  Cycle 1 carfilzomib/Cytoxan/Decadron 03/02/2018 (day 16 treatment held secondary to patient preference)   Cycle2carfilzomib/Cytoxan/Decadron 03/30/2018  Improvement, but persistent elevation of the kappa/lambda ratio, transplant team recommended changing treatment to daratumumab/pomalidomide/Decadron  Cycle 1 daratumumab/pomalidomide/Decadron beginning 05/11/2018 (day 22 daratumumab held secondary to an upper respiratory infection)  Cycle 2 daratumumab/pomalidomide/Decadron beginning 06/08/2018  IgA level and serum light chains improved December 2019  Cycle 3 daratumumab/pomalidomide/Decadron beginning 07/06/2018  Cycle 4 daratumumab/pomalidomide/Decadron 08/03/2018  Bone marrow biopsy 08/18/2018- 1% plasma cells, reduced iron stores  Cycle 5 daratumumab/pomalidomide/Decadron 09/07/2018  Monthly maintenance daratumumab beginning 10/05/2018  2.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 2.4 cm solid right thyroid mass, follow-up ultrasound recommended  Biopsy 01/14/2018-atypia of undetermined significance or follicular lesion of undetermined significance  Affirmatesting- likely a benign lesion  6.Right rib fractures 12/21/2017  7. Cough/dyspnea- potentially related to toxicity from daratumumab, daily Singulair started 07/13/2018-improved     Disposition: [name] appears stable.  She will complete another treatment with daratumumab today.  We will follow-up on the myeloma panel from today.  She will return for office visit and daratumumab in 1 month.  She continues every 9-monthZometa.  15 minutes were spent with the patient today.  The majority of the time was used for counseling and coordination of care.  GBetsy Coder  MD  02/22/2019  10:38 AM

## 2019-02-23 ENCOUNTER — Telehealth: Payer: Self-pay | Admitting: Oncology

## 2019-02-23 LAB — PROTEIN ELECTROPHORESIS, SERUM
A/G Ratio: 1.6 (ref 0.7–1.7)
Albumin ELP: 3.5 g/dL (ref 2.9–4.4)
Alpha-1-Globulin: 0.3 g/dL (ref 0.0–0.4)
Alpha-2-Globulin: 0.9 g/dL (ref 0.4–1.0)
Beta Globulin: 0.8 g/dL (ref 0.7–1.3)
Gamma Globulin: 0.2 g/dL — ABNORMAL LOW (ref 0.4–1.8)
Globulin, Total: 2.2 g/dL (ref 2.2–3.9)
Total Protein ELP: 5.7 g/dL — ABNORMAL LOW (ref 6.0–8.5)

## 2019-02-23 LAB — KAPPA/LAMBDA LIGHT CHAINS
Kappa free light chain: 10.4 mg/L (ref 3.3–19.4)
Kappa, lambda light chain ratio: 3.35 — ABNORMAL HIGH (ref 0.26–1.65)
Lambda free light chains: 3.1 mg/L — ABNORMAL LOW (ref 5.7–26.3)

## 2019-02-23 LAB — IGA: IgA: 18 mg/dL — ABNORMAL LOW (ref 87–352)

## 2019-02-23 NOTE — Telephone Encounter (Signed)
Called and spoke with patient. Confirmed 10/20 appt

## 2019-02-24 ENCOUNTER — Telehealth: Payer: Self-pay | Admitting: *Deleted

## 2019-02-24 NOTE — Telephone Encounter (Signed)
Notified patient of SPEP and light chain results and that MD considers this a remission.

## 2019-02-24 NOTE — Telephone Encounter (Signed)
-----   Message from Ladell Pier, MD sent at 02/23/2019  9:05 PM EDT ----- Please call patient, myeloma labs look good, appears to be in remission

## 2019-03-02 DIAGNOSIS — M47817 Spondylosis without myelopathy or radiculopathy, lumbosacral region: Secondary | ICD-10-CM | POA: Diagnosis not present

## 2019-03-02 DIAGNOSIS — M4807 Spinal stenosis, lumbosacral region: Secondary | ICD-10-CM | POA: Diagnosis not present

## 2019-03-02 DIAGNOSIS — F329 Major depressive disorder, single episode, unspecified: Secondary | ICD-10-CM | POA: Diagnosis not present

## 2019-03-02 DIAGNOSIS — G894 Chronic pain syndrome: Secondary | ICD-10-CM | POA: Diagnosis not present

## 2019-03-15 ENCOUNTER — Telehealth: Payer: Self-pay | Admitting: *Deleted

## 2019-03-15 DIAGNOSIS — C9000 Multiple myeloma not having achieved remission: Secondary | ICD-10-CM

## 2019-03-15 MED ORDER — GUAIFENESIN-CODEINE 100-10 MG/5ML PO SOLN: 5.0000 mL | Freq: Three times a day (TID) | ORAL | 0 refills | Status: AC | PRN

## 2019-03-15 NOTE — Telephone Encounter (Signed)
Requesting refill on her cough medication. OK per Dr. Benay Spice. Patient notified via VM

## 2019-03-19 ENCOUNTER — Other Ambulatory Visit: Payer: Self-pay | Admitting: Oncology

## 2019-03-22 ENCOUNTER — Inpatient Hospital Stay: Payer: PPO | Attending: Hematology and Oncology | Admitting: Oncology

## 2019-03-22 ENCOUNTER — Inpatient Hospital Stay: Payer: PPO

## 2019-03-22 ENCOUNTER — Other Ambulatory Visit: Payer: Self-pay

## 2019-03-22 VITALS — HR 96

## 2019-03-22 VITALS — BP 122/78 | HR 102 | Temp 98.4°F | Resp 17 | Ht 61.0 in | Wt 188.6 lb

## 2019-03-22 DIAGNOSIS — D6481 Anemia due to antineoplastic chemotherapy: Secondary | ICD-10-CM | POA: Diagnosis not present

## 2019-03-22 DIAGNOSIS — C9000 Multiple myeloma not having achieved remission: Secondary | ICD-10-CM

## 2019-03-22 DIAGNOSIS — Z853 Personal history of malignant neoplasm of breast: Secondary | ICD-10-CM | POA: Insufficient documentation

## 2019-03-22 DIAGNOSIS — Z23 Encounter for immunization: Secondary | ICD-10-CM

## 2019-03-22 DIAGNOSIS — Z95828 Presence of other vascular implants and grafts: Secondary | ICD-10-CM

## 2019-03-22 DIAGNOSIS — Z5112 Encounter for antineoplastic immunotherapy: Secondary | ICD-10-CM | POA: Diagnosis not present

## 2019-03-22 DIAGNOSIS — B1920 Unspecified viral hepatitis C without hepatic coma: Secondary | ICD-10-CM | POA: Diagnosis not present

## 2019-03-22 DIAGNOSIS — D509 Iron deficiency anemia, unspecified: Secondary | ICD-10-CM | POA: Diagnosis not present

## 2019-03-22 DIAGNOSIS — Z79899 Other long term (current) drug therapy: Secondary | ICD-10-CM | POA: Insufficient documentation

## 2019-03-22 DIAGNOSIS — F329 Major depressive disorder, single episode, unspecified: Secondary | ICD-10-CM | POA: Insufficient documentation

## 2019-03-22 LAB — CBC WITH DIFFERENTIAL (CANCER CENTER ONLY)
Abs Immature Granulocytes: 0.01 10*3/uL (ref 0.00–0.07)
Basophils Absolute: 0 10*3/uL (ref 0.0–0.1)
Basophils Relative: 0 %
Eosinophils Absolute: 0 10*3/uL (ref 0.0–0.5)
Eosinophils Relative: 1 %
HCT: 40.6 % (ref 36.0–46.0)
Hemoglobin: 13.2 g/dL (ref 12.0–15.0)
Immature Granulocytes: 0 %
Lymphocytes Relative: 28 %
Lymphs Abs: 1.6 10*3/uL (ref 0.7–4.0)
MCH: 29.7 pg (ref 26.0–34.0)
MCHC: 32.5 g/dL (ref 30.0–36.0)
MCV: 91.2 fL (ref 80.0–100.0)
Monocytes Absolute: 0.6 10*3/uL (ref 0.1–1.0)
Monocytes Relative: 10 %
Neutro Abs: 3.4 10*3/uL (ref 1.7–7.7)
Neutrophils Relative %: 61 %
Platelet Count: 202 10*3/uL (ref 150–400)
RBC: 4.45 MIL/uL (ref 3.87–5.11)
RDW: 13.1 % (ref 11.5–15.5)
WBC Count: 5.6 10*3/uL (ref 4.0–10.5)
nRBC: 0 % (ref 0.0–0.2)

## 2019-03-22 LAB — CMP (CANCER CENTER ONLY)
ALT: 49 U/L — ABNORMAL HIGH (ref 0–44)
AST: 44 U/L — ABNORMAL HIGH (ref 15–41)
Albumin: 3.9 g/dL (ref 3.5–5.0)
Alkaline Phosphatase: 68 U/L (ref 38–126)
Anion gap: 10 (ref 5–15)
BUN: 15 mg/dL (ref 8–23)
CO2: 25 mmol/L (ref 22–32)
Calcium: 9 mg/dL (ref 8.9–10.3)
Chloride: 101 mmol/L (ref 98–111)
Creatinine: 0.7 mg/dL (ref 0.44–1.00)
GFR, Est AFR Am: >60 mL/min (ref >60)
GFR, Estimated: >60 mL/min (ref >60)
Glucose, Bld: 130 mg/dL — ABNORMAL HIGH (ref 70–99)
Potassium: 4.5 mmol/L (ref 3.5–5.1)
Sodium: 136 mmol/L (ref 135–145)
Total Bilirubin: 0.3 mg/dL (ref 0.3–1.2)
Total Protein: 6.4 g/dL — ABNORMAL LOW (ref 6.5–8.1)

## 2019-03-22 MED ORDER — DIPHENHYDRAMINE HCL 25 MG PO CAPS
50.0000 mg | ORAL_CAPSULE | Freq: Once | ORAL | Status: AC
  Administered 2019-03-22: 50 mg via ORAL

## 2019-03-22 MED ORDER — HEPARIN SOD (PORK) LOCK FLUSH 100 UNIT/ML IV SOLN
500.0000 [IU] | Freq: Once | INTRAVENOUS | Status: AC | PRN
  Administered 2019-03-22: 500 [IU]
  Filled 2019-03-22: qty 5

## 2019-03-22 MED ORDER — MONTELUKAST SODIUM 10 MG PO TABS
10.0000 mg | ORAL_TABLET | Freq: Once | ORAL | Status: AC
  Administered 2019-03-22: 10 mg via ORAL

## 2019-03-22 MED ORDER — SODIUM CHLORIDE 0.9% FLUSH
10.0000 mL | INTRAVENOUS | Status: DC | PRN
  Administered 2019-03-22: 09:00:00 10 mL
  Filled 2019-03-22: qty 10

## 2019-03-22 MED ORDER — SODIUM CHLORIDE 0.9 % IV SOLN
20.0000 mg | Freq: Once | INTRAVENOUS | Status: AC
  Administered 2019-03-22: 20 mg via INTRAVENOUS
  Filled 2019-03-22: qty 20

## 2019-03-22 MED ORDER — DIPHENHYDRAMINE HCL 25 MG PO CAPS
ORAL_CAPSULE | ORAL | Status: AC
  Filled 2019-03-22: qty 2

## 2019-03-22 MED ORDER — ACETAMINOPHEN 325 MG PO TABS
ORAL_TABLET | ORAL | Status: AC
  Filled 2019-03-22: qty 2

## 2019-03-22 MED ORDER — SODIUM CHLORIDE 0.9 % IV SOLN
Freq: Once | INTRAVENOUS | Status: AC
  Administered 2019-03-22: 10:00:00 via INTRAVENOUS
  Filled 2019-03-22: qty 250

## 2019-03-22 MED ORDER — FAMOTIDINE IN NACL 20-0.9 MG/50ML-% IV SOLN
INTRAVENOUS | Status: AC
  Filled 2019-03-22: qty 50

## 2019-03-22 MED ORDER — SODIUM CHLORIDE 0.9% FLUSH
10.0000 mL | INTRAVENOUS | Status: DC | PRN
  Administered 2019-03-22: 15:00:00 10 mL
  Filled 2019-03-22: qty 10

## 2019-03-22 MED ORDER — MONTELUKAST SODIUM 10 MG PO TABS
ORAL_TABLET | ORAL | Status: AC
  Filled 2019-03-22: qty 1

## 2019-03-22 MED ORDER — SODIUM CHLORIDE 0.9 % IV SOLN
16.2000 mg/kg | Freq: Once | INTRAVENOUS | Status: AC
  Administered 2019-03-22: 1300 mg via INTRAVENOUS
  Filled 2019-03-22: qty 60

## 2019-03-22 MED ORDER — FAMOTIDINE IN NACL 20-0.9 MG/50ML-% IV SOLN
20.0000 mg | Freq: Once | INTRAVENOUS | Status: AC
  Administered 2019-03-22: 11:00:00 20 mg via INTRAVENOUS

## 2019-03-22 MED ORDER — INFLUENZA VAC A&B SA ADJ QUAD 0.5 ML IM PRSY
0.5000 mL | PREFILLED_SYRINGE | Freq: Once | INTRAMUSCULAR | Status: AC
  Administered 2019-03-22: 0.5 mL via INTRAMUSCULAR

## 2019-03-22 MED ORDER — ACETAMINOPHEN 325 MG PO TABS
650.0000 mg | ORAL_TABLET | Freq: Once | ORAL | Status: AC
  Administered 2019-03-22: 650 mg via ORAL

## 2019-03-22 MED ORDER — INFLUENZA VAC A&B SA ADJ QUAD 0.5 ML IM PRSY
PREFILLED_SYRINGE | INTRAMUSCULAR | Status: AC
  Filled 2019-03-22: qty 0.5

## 2019-03-22 NOTE — Progress Notes (Signed)
  [place] OFFICE PROGRESS NOTE   Diagnosis: Multiple myeloma  INTERVAL HISTORY:   [name] returns as scheduled.  She was last treated with daratumumab on 02/22/2019.  She feels well.  She had a "cold "last week.  The symptoms have resolved.  Her cough remains improved.  Objective:  Vital signs in last 24 hours:  Blood pressure 122/78, pulse (!) 102, temperature 98.4 F (36.9 C), temperature source Oral, resp. rate 17, height '5\' 1"'$  (1.549 m), weight 188 lb 9.6 oz (85.5 kg), SpO2 98 %.    Physical examination not performed today secondary to distancing with the Covid pandemic  Portacath/PICC-without erythema  Lab Results:  Lab Results  Component Value Date   WBC 5.6 03/22/2019   HGB 13.2 03/22/2019   HCT 40.6 03/22/2019   MCV 91.2 03/22/2019   PLT 202 03/22/2019   NEUTROABS 3.4 03/22/2019    CMP  Lab Results  Component Value Date   NA 136 03/22/2019   K 4.5 03/22/2019   CL 101 03/22/2019   CO2 25 03/22/2019   GLUCOSE 130 (H) 03/22/2019   BUN 15 03/22/2019   CREATININE 0.70 03/22/2019   CALCIUM 9.0 03/22/2019   PROT 6.4 (L) 03/22/2019   ALBUMIN 3.9 03/22/2019   AST 44 (H) 03/22/2019   ALT 49 (H) 03/22/2019   ALKPHOS 68 03/22/2019   BILITOT 0.3 03/22/2019   GFRNONAA >60 03/22/2019   GFRAA >60 03/22/2019   02/22/2019: Serum M spike-none observed, IgA 18, free kappa light chains 10.4  Medications: I have reviewed the patient's current medications.   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 09/22/2017  57% plasma cells on initial bone marrow biopsy, kappa light chain restricted  Normal cytogenetics, FISH with -4, -14, and 13q-abnormalities  PET scan 10/16/2017-negative for bone lesions  Cycle 1 RVD 10/23/2017  Cycle 2 RVD 11/13/2017  Cycle 3 RVD 12/04/2017 (Revlimid dose reduced to 15 mg)  Cycle 4 RVD 12/29/2017  Cycle 5 RVD 01/26/2018  Cycle 1 carfilzomib/Cytoxan/Decadron 03/02/2018 (day 16  treatment held secondary to patient preference)  Cycle2carfilzomib/Cytoxan/Decadron 03/30/2018  Improvement, but persistent elevation of the kappa/lambda ratio, transplant team recommended changing treatment to daratumumab/pomalidomide/Decadron  Cycle 1 daratumumab/pomalidomide/Decadron beginning 05/11/2018 (day 22 daratumumab held secondary to an upper respiratory infection)  Cycle 2 daratumumab/pomalidomide/Decadron beginning 06/08/2018  IgA level and serum light chains improved December 2019  Cycle 3 daratumumab/pomalidomide/Decadron beginning 07/06/2018  Cycle 4 daratumumab/pomalidomide/Decadron 08/03/2018  Bone marrow biopsy 08/18/2018- 1% plasma cells, reduced iron stores  Cycle 5 daratumumab/pomalidomide/Decadron 09/07/2018  Monthly maintenance daratumumab beginning 10/05/2018  2.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 2.4 cm solid right thyroid mass, follow-up ultrasound recommended  Biopsy 01/14/2018-atypia of undetermined significance or follicular lesion of undetermined significance  Affirmatesting- likely a benign lesion  6.Right rib fractures 12/21/2017  7. Cough/dyspnea- potentially related to toxicity from daratumumab, daily Singulair started 07/13/2018-improved  8.  Hepatitis C   Disposition: [name] appears unchanged.  She is tolerating the daratumumab well.  She is in clinical remission from myeloma.She will return for an office visit, daratumumab, and Zometa on 04/19/2019.  We will repeat a myeloma panel when she returns next month.  Meghan Coder, [name]  03/22/2019  9:36 AM

## 2019-03-22 NOTE — Patient Instructions (Signed)
[place] Discharge Instructions for Patients Receiving Chemotherapy  Today you received the following immunotherapy agents:  Daratumumab  To help prevent nausea and vomiting after your treatment, we encourage you to take your nausea medication as needed.  If you develop nausea and vomiting that is not controlled by your nausea medication, call the clinic.   BELOW ARE SYMPTOMS THAT SHOULD BE REPORTED IMMEDIATELY:  *FEVER GREATER THAN 100.5 F  *CHILLS WITH OR WITHOUT FEVER  NAUSEA AND VOMITING THAT IS NOT CONTROLLED WITH YOUR NAUSEA MEDICATION  *UNUSUAL SHORTNESS OF BREATH  *UNUSUAL BRUISING OR BLEEDING  TENDERNESS IN MOUTH AND THROAT WITH OR WITHOUT PRESENCE OF ULCERS  *URINARY PROBLEMS  *BOWEL PROBLEMS  UNUSUAL RASH Items with * indicate a potential emergency and should be followed up as soon as possible.  Feel free to call the clinic should you have any questions or concerns. The clinic phone number is [phone].  Please show the [place] at check-in to the Emergency Department and triage nurse.

## 2019-03-22 NOTE — Patient Instructions (Signed)

## 2019-03-23 ENCOUNTER — Telehealth: Payer: Self-pay | Admitting: Oncology

## 2019-03-23 NOTE — Telephone Encounter (Signed)
Called and spoke with patient. Confirmed appt  °

## 2019-03-29 ENCOUNTER — Other Ambulatory Visit: Payer: Self-pay | Admitting: Oncology

## 2019-03-29 NOTE — Telephone Encounter (Signed)
Refill request

## 2019-04-05 DIAGNOSIS — M4807 Spinal stenosis, lumbosacral region: Secondary | ICD-10-CM | POA: Diagnosis not present

## 2019-04-05 DIAGNOSIS — M47817 Spondylosis without myelopathy or radiculopathy, lumbosacral region: Secondary | ICD-10-CM | POA: Diagnosis not present

## 2019-04-05 DIAGNOSIS — F329 Major depressive disorder, single episode, unspecified: Secondary | ICD-10-CM | POA: Diagnosis not present

## 2019-04-05 DIAGNOSIS — G894 Chronic pain syndrome: Secondary | ICD-10-CM | POA: Diagnosis not present

## 2019-04-17 ENCOUNTER — Other Ambulatory Visit: Payer: Self-pay | Admitting: Oncology

## 2019-04-19 ENCOUNTER — Inpatient Hospital Stay: Payer: PPO

## 2019-04-19 ENCOUNTER — Inpatient Hospital Stay: Payer: PPO | Attending: Hematology and Oncology | Admitting: Oncology

## 2019-04-19 ENCOUNTER — Other Ambulatory Visit: Payer: Self-pay

## 2019-04-19 VITALS — BP 132/77 | HR 81 | Temp 98.5°F | Resp 18

## 2019-04-19 VITALS — BP 133/90 | HR 104 | Temp 98.5°F | Resp 18 | Ht 61.0 in | Wt 192.7 lb

## 2019-04-19 DIAGNOSIS — C9000 Multiple myeloma not having achieved remission: Secondary | ICD-10-CM

## 2019-04-19 DIAGNOSIS — B1920 Unspecified viral hepatitis C without hepatic coma: Secondary | ICD-10-CM | POA: Insufficient documentation

## 2019-04-19 DIAGNOSIS — Z5112 Encounter for antineoplastic immunotherapy: Secondary | ICD-10-CM | POA: Diagnosis not present

## 2019-04-19 DIAGNOSIS — D6481 Anemia due to antineoplastic chemotherapy: Secondary | ICD-10-CM | POA: Diagnosis not present

## 2019-04-19 DIAGNOSIS — F329 Major depressive disorder, single episode, unspecified: Secondary | ICD-10-CM | POA: Insufficient documentation

## 2019-04-19 DIAGNOSIS — Z95828 Presence of other vascular implants and grafts: Secondary | ICD-10-CM

## 2019-04-19 DIAGNOSIS — Z79899 Other long term (current) drug therapy: Secondary | ICD-10-CM | POA: Diagnosis not present

## 2019-04-19 DIAGNOSIS — Z853 Personal history of malignant neoplasm of breast: Secondary | ICD-10-CM | POA: Insufficient documentation

## 2019-04-19 DIAGNOSIS — D509 Iron deficiency anemia, unspecified: Secondary | ICD-10-CM | POA: Insufficient documentation

## 2019-04-19 LAB — CBC WITH DIFFERENTIAL (CANCER CENTER ONLY)
Abs Immature Granulocytes: 0.01 10*3/uL (ref 0.00–0.07)
Basophils Absolute: 0 10*3/uL (ref 0.0–0.1)
Basophils Relative: 1 %
Eosinophils Absolute: 0 10*3/uL (ref 0.0–0.5)
Eosinophils Relative: 1 %
HCT: 39.3 % (ref 36.0–46.0)
Hemoglobin: 12.7 g/dL (ref 12.0–15.0)
Immature Granulocytes: 0 %
Lymphocytes Relative: 30 %
Lymphs Abs: 1.2 10*3/uL (ref 0.7–4.0)
MCH: 29.4 pg (ref 26.0–34.0)
MCHC: 32.3 g/dL (ref 30.0–36.0)
MCV: 91 fL (ref 80.0–100.0)
Monocytes Absolute: 0.5 10*3/uL (ref 0.1–1.0)
Monocytes Relative: 13 %
Neutro Abs: 2.2 10*3/uL (ref 1.7–7.7)
Neutrophils Relative %: 55 %
Platelet Count: 172 10*3/uL (ref 150–400)
RBC: 4.32 MIL/uL (ref 3.87–5.11)
RDW: 13.3 % (ref 11.5–15.5)
WBC Count: 4 10*3/uL (ref 4.0–10.5)
nRBC: 0 % (ref 0.0–0.2)

## 2019-04-19 LAB — CMP (CANCER CENTER ONLY)
ALT: 41 U/L (ref 0–44)
AST: 34 U/L (ref 15–41)
Albumin: 3.6 g/dL (ref 3.5–5.0)
Alkaline Phosphatase: 70 U/L (ref 38–126)
Anion gap: 10 (ref 5–15)
BUN: 11 mg/dL (ref 8–23)
CO2: 24 mmol/L (ref 22–32)
Calcium: 8.9 mg/dL (ref 8.9–10.3)
Chloride: 103 mmol/L (ref 98–111)
Creatinine: 0.69 mg/dL (ref 0.44–1.00)
GFR, Est AFR Am: >60 mL/min (ref >60)
GFR, Estimated: >60 mL/min (ref >60)
Glucose, Bld: 111 mg/dL — ABNORMAL HIGH (ref 70–99)
Potassium: 4.7 mmol/L (ref 3.5–5.1)
Sodium: 137 mmol/L (ref 135–145)
Total Bilirubin: 0.3 mg/dL (ref 0.3–1.2)
Total Protein: 6 g/dL — ABNORMAL LOW (ref 6.5–8.1)

## 2019-04-19 MED ORDER — MONTELUKAST SODIUM 10 MG PO TABS
ORAL_TABLET | ORAL | Status: AC
  Filled 2019-04-19: qty 1

## 2019-04-19 MED ORDER — FAMOTIDINE IN NACL 20-0.9 MG/50ML-% IV SOLN
INTRAVENOUS | Status: AC
  Filled 2019-04-19: qty 50

## 2019-04-19 MED ORDER — DIPHENHYDRAMINE HCL 25 MG PO CAPS
50.0000 mg | ORAL_CAPSULE | Freq: Once | ORAL | Status: AC
  Administered 2019-04-19: 50 mg via ORAL

## 2019-04-19 MED ORDER — MONTELUKAST SODIUM 10 MG PO TABS
10.0000 mg | ORAL_TABLET | Freq: Once | ORAL | Status: AC
  Administered 2019-04-19: 10 mg via ORAL

## 2019-04-19 MED ORDER — FAMOTIDINE IN NACL 20-0.9 MG/50ML-% IV SOLN
20.0000 mg | Freq: Once | INTRAVENOUS | Status: AC
  Administered 2019-04-19: 20 mg via INTRAVENOUS

## 2019-04-19 MED ORDER — ZOLEDRONIC ACID 4 MG/100ML IV SOLN
4.0000 mg | Freq: Once | INTRAVENOUS | Status: AC
  Administered 2019-04-19: 4 mg via INTRAVENOUS
  Filled 2019-04-19: qty 100

## 2019-04-19 MED ORDER — DIPHENHYDRAMINE HCL 25 MG PO CAPS
ORAL_CAPSULE | ORAL | Status: AC
  Filled 2019-04-19: qty 2

## 2019-04-19 MED ORDER — ACETAMINOPHEN 325 MG PO TABS
650.0000 mg | ORAL_TABLET | Freq: Once | ORAL | Status: AC
  Administered 2019-04-19: 650 mg via ORAL

## 2019-04-19 MED ORDER — SODIUM CHLORIDE 0.9 % IV SOLN
20.0000 mg | Freq: Once | INTRAVENOUS | Status: AC
  Administered 2019-04-19: 20 mg via INTRAVENOUS
  Filled 2019-04-19: qty 20

## 2019-04-19 MED ORDER — ACETAMINOPHEN 325 MG PO TABS
ORAL_TABLET | ORAL | Status: AC
  Filled 2019-04-19: qty 2

## 2019-04-19 MED ORDER — HEPARIN SOD (PORK) LOCK FLUSH 100 UNIT/ML IV SOLN
500.0000 [IU] | Freq: Once | INTRAVENOUS | Status: AC | PRN
  Administered 2019-04-19: 500 [IU]
  Filled 2019-04-19: qty 5

## 2019-04-19 MED ORDER — SODIUM CHLORIDE 0.9 % IV SOLN
Freq: Once | INTRAVENOUS | Status: AC
  Administered 2019-04-19: 10:00:00 via INTRAVENOUS
  Filled 2019-04-19: qty 250

## 2019-04-19 MED ORDER — SODIUM CHLORIDE 0.9% FLUSH
10.0000 mL | INTRAVENOUS | Status: DC | PRN
  Administered 2019-04-19: 10 mL
  Filled 2019-04-19: qty 10

## 2019-04-19 MED ORDER — SODIUM CHLORIDE 0.9 % IV SOLN
1300.0000 mg | Freq: Once | INTRAVENOUS | Status: AC
  Administered 2019-04-19: 1300 mg via INTRAVENOUS
  Filled 2019-04-19: qty 60

## 2019-04-19 NOTE — Progress Notes (Signed)
  [place] OFFICE PROGRESS NOTE   Diagnosis: Multiple myeloma  INTERVAL HISTORY:   [name] completed another cycle of daratumumab on 03/22/2019.  She reports no symptom of an allergic reaction.  She has chronic back pain.  She has an intermittent cough.  No new complaint.  Objective:  Vital signs in last 24 hours:  Blood pressure 133/90, pulse (!) 104, temperature 98.5 F (36.9 C), temperature source Temporal, resp. rate 18, height '5\' 1"'$  (1.549 m), weight 192 lb 11.2 oz (87.4 kg), SpO2 98 %.    HEENT: No thrush GI: No hepatosplenomegaly, nontender Vascular: The left lower leg is larger than the right side    Portacath/PICC-without erythema  Lab Results:  Lab Results  Component Value Date   WBC 4.0 04/19/2019   HGB 12.7 04/19/2019   HCT 39.3 04/19/2019   MCV 91.0 04/19/2019   PLT 172 04/19/2019   NEUTROABS 2.2 04/19/2019    CMP  Lab Results  Component Value Date   NA 137 04/19/2019   K 4.7 04/19/2019   CL 103 04/19/2019   CO2 24 04/19/2019   GLUCOSE 111 (H) 04/19/2019   BUN 11 04/19/2019   CREATININE 0.69 04/19/2019   CALCIUM 8.9 04/19/2019   PROT 6.0 (L) 04/19/2019   ALBUMIN 3.6 04/19/2019   AST 34 04/19/2019   ALT 41 04/19/2019   ALKPHOS 70 04/19/2019   BILITOT 0.3 04/19/2019   GFRNONAA >60 04/19/2019   GFRAA >60 04/19/2019     Medications: I have reviewed the patient's current medications.   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 09/22/2017  57% plasma cells on initial bone marrow biopsy, kappa light chain restricted  Normal cytogenetics, FISH with -4, -14, and 13q-abnormalities  PET scan 10/16/2017-negative for bone lesions  Cycle 1 RVD 10/23/2017  Cycle 2 RVD 11/13/2017  Cycle 3 RVD 12/04/2017 (Revlimid dose reduced to 15 mg)  Cycle 4 RVD 12/29/2017  Cycle 5 RVD 01/26/2018  Cycle 1 carfilzomib/Cytoxan/Decadron 03/02/2018 (day 16 treatment held secondary to patient preference)   Cycle2carfilzomib/Cytoxan/Decadron 03/30/2018  Improvement, but persistent elevation of the kappa/lambda ratio, transplant team recommended changing treatment to daratumumab/pomalidomide/Decadron  Cycle 1 daratumumab/pomalidomide/Decadron beginning 05/11/2018 (day 22 daratumumab held secondary to an upper respiratory infection)  Cycle 2 daratumumab/pomalidomide/Decadron beginning 06/08/2018  IgA level and serum light chains improved December 2019  Cycle 3 daratumumab/pomalidomide/Decadron beginning 07/06/2018  Cycle 4 daratumumab/pomalidomide/Decadron 08/03/2018  Bone marrow biopsy 08/18/2018- 1% plasma cells, reduced iron stores  Cycle 5 daratumumab/pomalidomide/Decadron 09/07/2018  Monthly maintenance daratumumab beginning 10/05/2018  2.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 2.4 cm solid right thyroid mass, follow-up ultrasound recommended  Biopsy 01/14/2018-atypia of undetermined significance or follicular lesion of undetermined significance  Affirmatesting- likely a benign lesion  6.Right rib fractures 12/21/2017  7. Cough/dyspnea- potentially related to toxicity from daratumumab, daily Singulair started 07/13/2018-improved  8.  Hepatitis C   Disposition: Ms. Zogg appears stable.  The light chains remained normal and there was no detectable M spike on 02/22/2019.  We will follow-up on the myeloma panel from today.  The plan is to continue monthly daratumumab.  She will return for an office visit in 1 month.  Betsy Coder, MD  04/19/2019  9:56 AM

## 2019-04-19 NOTE — Progress Notes (Signed)
Pt stated she did not take her decadron 20 mg at home today .

## 2019-04-19 NOTE — Patient Instructions (Signed)
[facility] Discharge Instructions for Patients Receiving Chemotherapy  Today you received the following immunotherapy agents:  Daratumumab  To help prevent nausea and vomiting after your treatment, we encourage you to take your nausea medication as needed.  If you develop nausea and vomiting that is not controlled by your nausea medication, call the clinic.   BELOW ARE SYMPTOMS THAT SHOULD BE REPORTED IMMEDIATELY:  *FEVER GREATER THAN 100.5 F  *CHILLS WITH OR WITHOUT FEVER  NAUSEA AND VOMITING THAT IS NOT CONTROLLED WITH YOUR NAUSEA MEDICATION  *UNUSUAL SHORTNESS OF BREATH  *UNUSUAL BRUISING OR BLEEDING  TENDERNESS IN MOUTH AND THROAT WITH OR WITHOUT PRESENCE OF ULCERS  *URINARY PROBLEMS  *BOWEL PROBLEMS  UNUSUAL RASH Items with * indicate a potential emergency and should be followed up as soon as possible.  Feel free to call the clinic should you have any questions or concerns. The clinic phone number is [phone].  Please show the [facility] at check-in to the Emergency Department and triage nurse.  Zoledronic Acid injection (Hypercalcemia, Oncology) What is this medicine? ZOLEDRONIC ACID (ZOE le dron ik AS id) lowers the amount of calcium loss from bone. It is used to treat too much calcium in your blood from cancer. It is also used to prevent complications of cancer that has spread to the bone. This medicine may be used for other purposes; ask your health care provider or pharmacist if you have questions. COMMON BRAND NAME(S): Zometa What should I tell my health care provider before I take this medicine? They need to know if you have any of these conditions:  aspirin-sensitive asthma  cancer, especially if you are receiving medicines used to treat cancer  dental disease or wear dentures  infection  kidney disease  receiving corticosteroids like dexamethasone or prednisone  an unusual or allergic reaction to zoledronic acid, other  medicines, foods, dyes, or preservatives  pregnant or trying to get pregnant  breast-feeding How should I use this medicine? This medicine is for infusion into a vein. It is given by a health care professional in a hospital or clinic setting. Talk to your pediatrician regarding the use of this medicine in children. Special care may be needed. Overdosage: If you think you have taken too much of this medicine contact a poison control center or emergency room at once. NOTE: This medicine is only for you. Do not share this medicine with others. What if I miss a dose? It is important not to miss your dose. Call your doctor or health care professional if you are unable to keep an appointment. What may interact with this medicine?  certain antibiotics given by injection  NSAIDs, medicines for pain and inflammation, like ibuprofen or naproxen  some diuretics like bumetanide, furosemide  teriparatide  thalidomide This list may not describe all possible interactions. Give your health care provider a list of all the medicines, herbs, non-prescription drugs, or dietary supplements you use. Also tell them if you smoke, drink alcohol, or use illegal drugs. Some items may interact with your medicine. What should I watch for while using this medicine? Visit your doctor or health care professional for regular checkups. It may be some time before you see the benefit from this medicine. Do not stop taking your medicine unless your doctor tells you to. Your doctor may order blood tests or other tests to see how you are doing. Women should inform their doctor if they wish to become pregnant or think they might be pregnant.  There is a potential for serious side effects to an unborn child. Talk to your health care professional or pharmacist for more information. You should make sure that you get enough calcium and vitamin D while you are taking this medicine. Discuss the foods you eat and the vitamins you take  with your health care professional. Some people who take this medicine have severe bone, joint, and/or muscle pain. This medicine may also increase your risk for jaw problems or a broken thigh bone. Tell your doctor right away if you have severe pain in your jaw, bones, joints, or muscles. Tell your doctor if you have any pain that does not go away or that gets worse. Tell your dentist and dental surgeon that you are taking this medicine. You should not have major dental surgery while on this medicine. See your dentist to have a dental exam and fix any dental problems before starting this medicine. Take good care of your teeth while on this medicine. Make sure you see your dentist for regular follow-up appointments. What side effects may I notice from receiving this medicine? Side effects that you should report to your doctor or health care professional as soon as possible:  allergic reactions like skin rash, itching or hives, swelling of the face, lips, or tongue  anxiety, confusion, or depression  breathing problems  changes in vision  eye pain  feeling faint or lightheaded, falls  jaw pain, especially after dental work  mouth sores  muscle cramps, stiffness, or weakness  redness, blistering, peeling or loosening of the skin, including inside the mouth  trouble passing urine or change in the amount of urine Side effects that usually do not require medical attention (report to your doctor or health care professional if they continue or are bothersome):  bone, joint, or muscle pain  constipation  diarrhea  fever  hair loss  irritation at site where injected  loss of appetite  nausea, vomiting  stomach upset  trouble sleeping  trouble swallowing  weak or tired This list may not describe all possible side effects. Call your doctor for medical advice about side effects. You may report side effects to FDA at 1-800-FDA-1088. Where should I keep my medicine? This drug  is given in a hospital or clinic and will not be stored at home. NOTE: This sheet is a summary. It may not cover all possible information. If you have questions about this medicine, talk to your doctor, pharmacist, or health care provider.  2020 [employer]vier/Gold Standard (2013-11-12 14:19:39)  

## 2019-04-20 LAB — KAPPA/LAMBDA LIGHT CHAINS
Kappa free light chain: 11.6 mg/L (ref 3.3–19.4)
Kappa, lambda light chain ratio: 3.63 — ABNORMAL HIGH (ref 0.26–1.65)
Lambda free light chains: 3.2 mg/L — ABNORMAL LOW (ref 5.7–26.3)

## 2019-04-20 LAB — IGA: IgA: 22 mg/dL — ABNORMAL LOW (ref 87–352)

## 2019-04-21 ENCOUNTER — Telehealth: Payer: Self-pay

## 2019-04-21 LAB — PROTEIN ELECTROPHORESIS, SERUM
A/G Ratio: 1.5 (ref 0.7–1.7)
Albumin ELP: 3.4 g/dL (ref 2.9–4.4)
Alpha-1-Globulin: 0.2 g/dL (ref 0.0–0.4)
Alpha-2-Globulin: 0.9 g/dL (ref 0.4–1.0)
Beta Globulin: 0.8 g/dL (ref 0.7–1.3)
Gamma Globulin: 0.3 g/dL — ABNORMAL LOW (ref 0.4–1.8)
Globulin, Total: 2.2 g/dL (ref 2.2–3.9)
M-Spike, %: 0.1 g/dL — ABNORMAL HIGH
Total Protein ELP: 5.6 g/dL — ABNORMAL LOW (ref 6.0–8.5)

## 2019-04-21 NOTE — Telephone Encounter (Signed)
Attempted call to Pt. To give lab results message left to return call to [facility].

## 2019-04-21 NOTE — Telephone Encounter (Signed)
-----   Message from Ladell Pier, MD sent at 04/20/2019  8:44 PM EDT ----- Please call patient,kappa light chains are normal

## 2019-04-22 ENCOUNTER — Telehealth: Payer: Self-pay | Admitting: *Deleted

## 2019-04-22 NOTE — Telephone Encounter (Signed)
Patient called to obtain light chain results and this was provided. Requested that MD be asked about her calcium intake and if her low calcium level is due to myeloma? Currently with her Calcium/D supplement and MVI she is taking 2600 mg of calcium/day and level is still at low normal. Asking if there is anything else to do to increase this?

## 2019-04-25 ENCOUNTER — Telehealth: Payer: Self-pay | Admitting: *Deleted

## 2019-04-25 NOTE — Telephone Encounter (Signed)
Left VM for patient w/MD response to her questions on Friday regarding calcium. Per Dr. Benay Spice: Calcium is normal. Low normal likely due to Zometa. Myeloma would cause hypercalcemia. Continue calcium supplements at current dosing.

## 2019-05-02 DIAGNOSIS — F3341 Major depressive disorder, recurrent, in partial remission: Secondary | ICD-10-CM | POA: Diagnosis not present

## 2019-05-02 DIAGNOSIS — F411 Generalized anxiety disorder: Secondary | ICD-10-CM | POA: Diagnosis not present

## 2019-05-02 DIAGNOSIS — F5101 Primary insomnia: Secondary | ICD-10-CM | POA: Diagnosis not present

## 2019-05-03 DIAGNOSIS — F329 Major depressive disorder, single episode, unspecified: Secondary | ICD-10-CM | POA: Diagnosis not present

## 2019-05-03 DIAGNOSIS — G894 Chronic pain syndrome: Secondary | ICD-10-CM | POA: Diagnosis not present

## 2019-05-03 DIAGNOSIS — M4807 Spinal stenosis, lumbosacral region: Secondary | ICD-10-CM | POA: Diagnosis not present

## 2019-05-03 DIAGNOSIS — M47817 Spondylosis without myelopathy or radiculopathy, lumbosacral region: Secondary | ICD-10-CM | POA: Diagnosis not present

## 2019-05-04 DIAGNOSIS — Z23 Encounter for immunization: Secondary | ICD-10-CM | POA: Diagnosis not present

## 2019-05-04 DIAGNOSIS — M7989 Other specified soft tissue disorders: Secondary | ICD-10-CM | POA: Diagnosis not present

## 2019-05-04 DIAGNOSIS — M79676 Pain in unspecified toe(s): Secondary | ICD-10-CM | POA: Diagnosis not present

## 2019-05-04 DIAGNOSIS — C9000 Multiple myeloma not having achieved remission: Secondary | ICD-10-CM | POA: Diagnosis not present

## 2019-05-05 ENCOUNTER — Telehealth: Payer: Self-pay | Admitting: *Deleted

## 2019-05-05 NOTE — Telephone Encounter (Signed)
Reports her PCP provided her with a script to have fasting glucose, HgbA1c, lipid panel and TSH next time she is at cancer center for labs. Asking if this can be done here and if we are giving shingles vaccine? Informed her to bring the script and give it to the phlebotomist and they can draw the labs for her PCP and results will be forwarded to PCP. Shingles vaccine needs to be done at the pharmacy and informed that [facility] does these.

## 2019-05-09 NOTE — Telephone Encounter (Signed)
Per pharmacy, there is no restrictions on timing of shingles vaccine in regards to her daratumumab. Pharmacy states she must be sure to get the recombinant vaccine, Shingarix and not a live vaccine. She understands and agrees.

## 2019-05-15 ENCOUNTER — Other Ambulatory Visit: Payer: Self-pay | Admitting: Oncology

## 2019-05-16 ENCOUNTER — Encounter: Payer: Self-pay | Admitting: *Deleted

## 2019-05-16 DIAGNOSIS — R404 Transient alteration of awareness: Secondary | ICD-10-CM | POA: Diagnosis not present

## 2019-05-16 DIAGNOSIS — I469 Cardiac arrest, cause unspecified: Secondary | ICD-10-CM | POA: Diagnosis not present

## 2019-05-17 ENCOUNTER — Inpatient Hospital Stay: Payer: PPO

## 2019-05-17 ENCOUNTER — Inpatient Hospital Stay: Payer: PPO | Admitting: Oncology

## 2019-05-24 IMAGING — CT CT BIOPSY AND ASPIRATION BONE MARROW
1 of 2 series · 12 of 16 positions shown, 15 images · non-contrast
Comparison: none

INDICATION: 66-year-old female with a history leukopenia and anemia

[Series 2: i-spiral 5.0 b40f · axial · 0.98mm/px · z∈[-98,-45]mm · 12 of 19 slices shown, 15 images]
[im 2/19  soft-tissue]
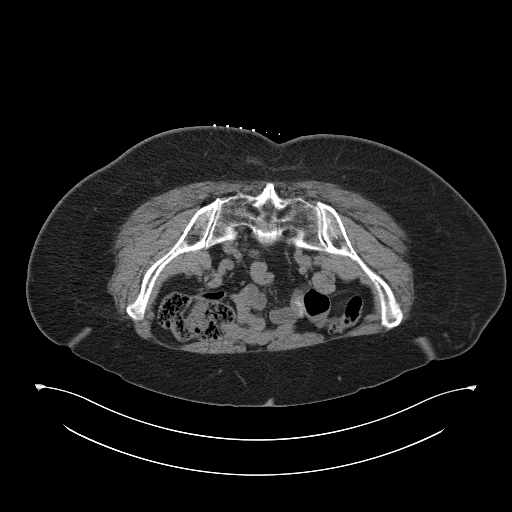
[im 2/19  bone]
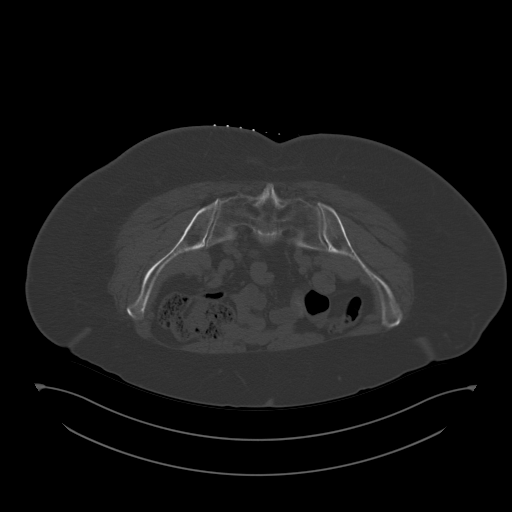
[im 3/19  soft-tissue]
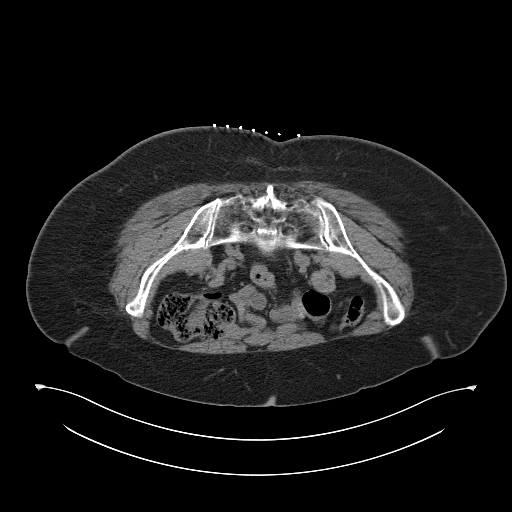
[im 5/19  soft-tissue]
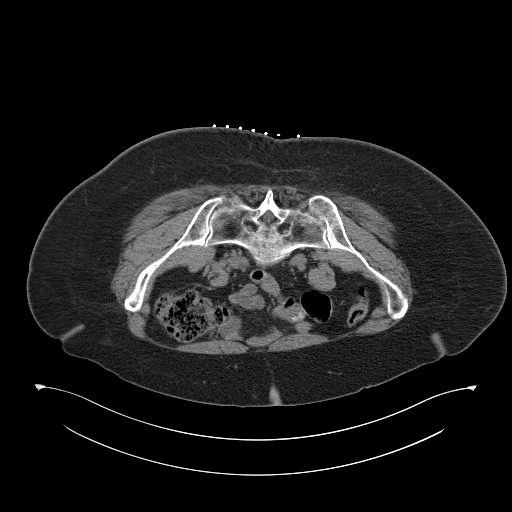
[im 6/19  soft-tissue]
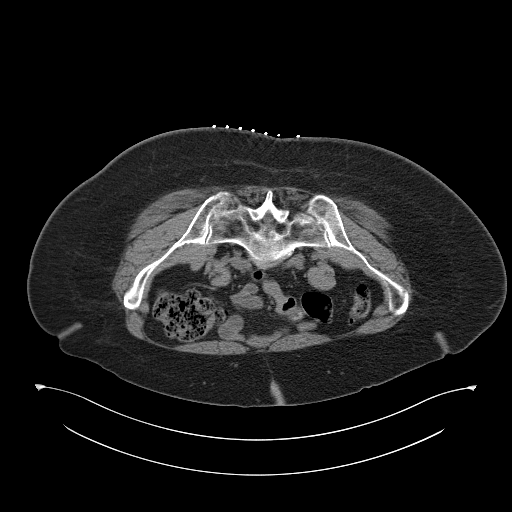
[im 7/19  soft-tissue]
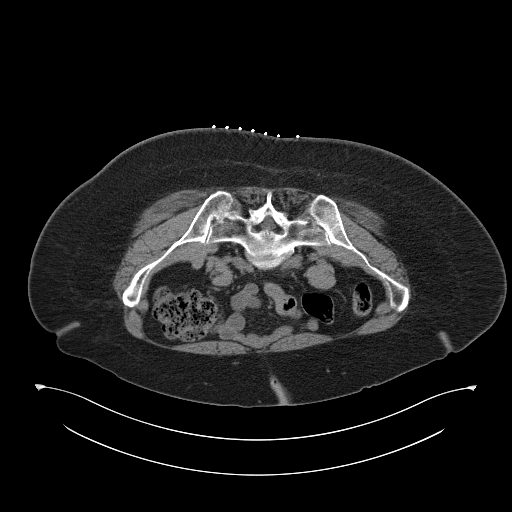
[im 7/19  bone]
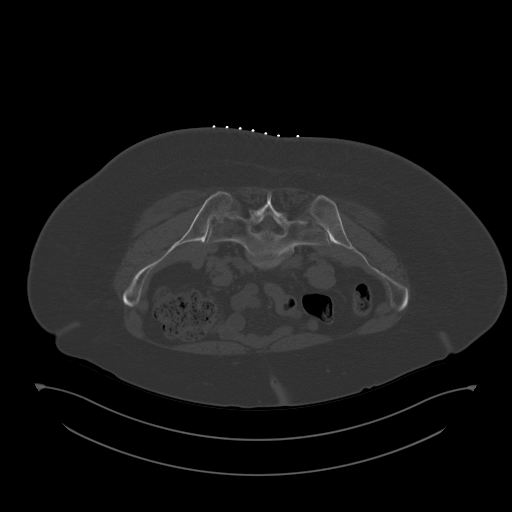
[im 9/19  soft-tissue]
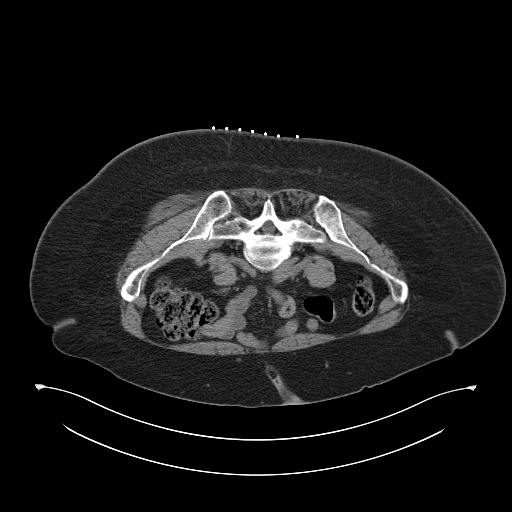
[im 10/19  soft-tissue]
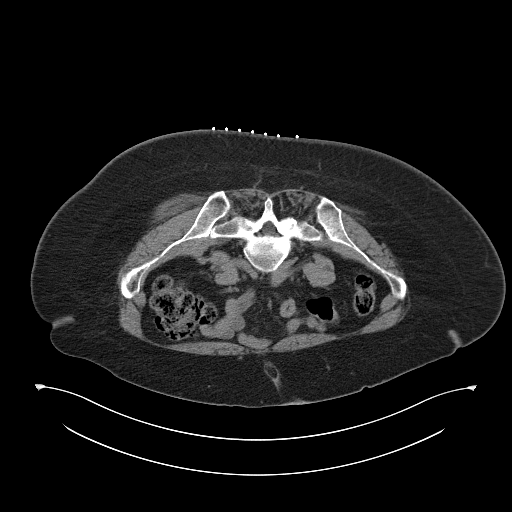
[im 12/19  soft-tissue]
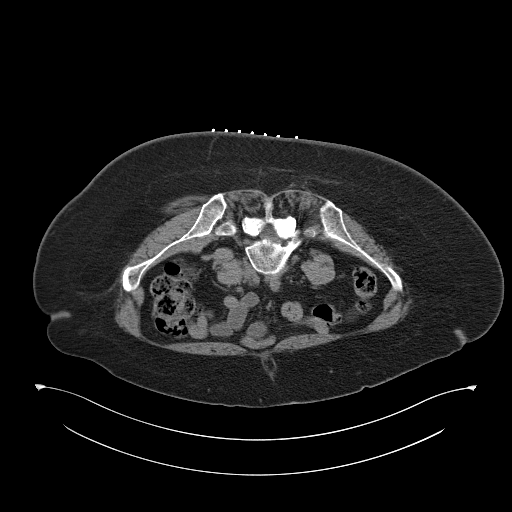
[im 13/19  soft-tissue]
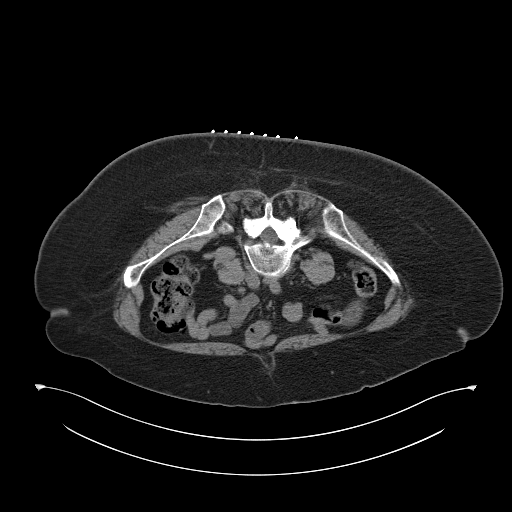
[im 13/19  bone]
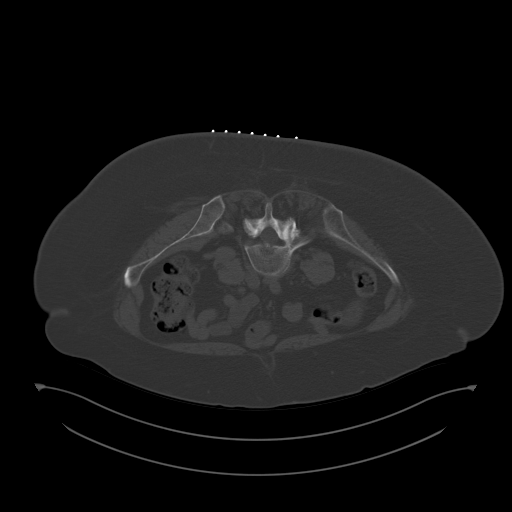
[im 14/19  soft-tissue]
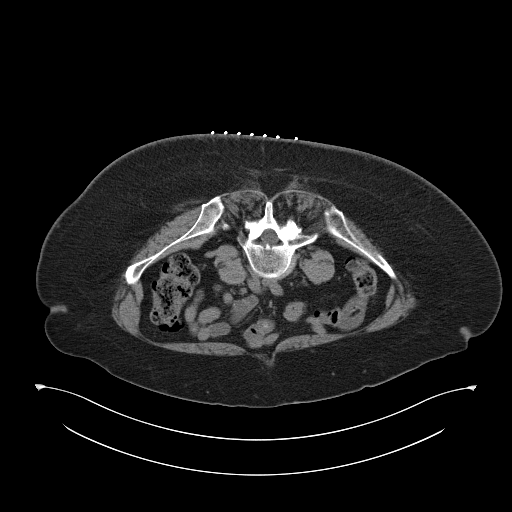
[im 16/19  soft-tissue]
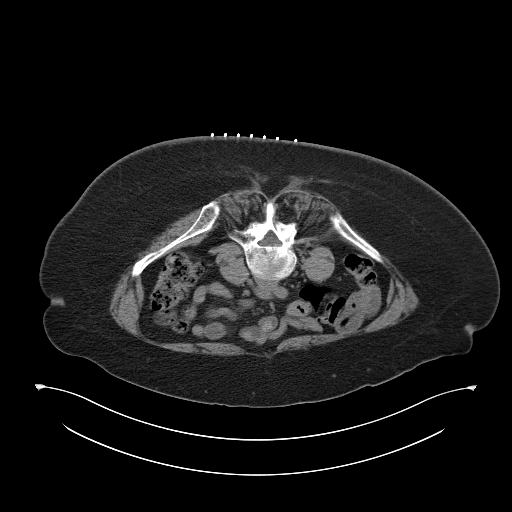
[im 17/19  soft-tissue]
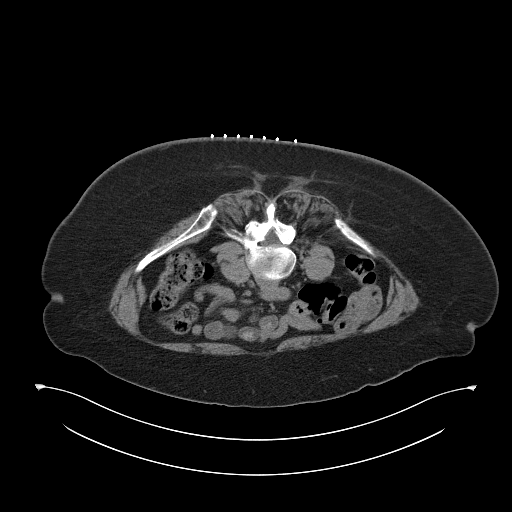

[12 of 16 positions shown; findings below may reference images not displayed]

EXAM:
CT BIOPSY; CT BONE MARROW BIOPSY AND ASPIRATION

MEDICATIONS:
None.

ANESTHESIA/SEDATION:
Moderate (conscious) sedation was employed during this procedure. A
total of Versed 2.0 mg and Fentanyl 100 mcg was administered
intravenously.

Moderate Sedation Time: 10 minutes. The patient's level of
consciousness and vital signs were monitored continuously by
radiology nursing throughout the procedure under my direct
supervision.

FLUOROSCOPY TIME:  CT

COMPLICATIONS:
None

PROCEDURE:
The procedure risks, benefits, and alternatives were explained to
the patient. Questions regarding the procedure were encouraged and
answered. The patient understands and consents to the procedure.

Scout CT of the pelvis was performed for surgical planning purposes.

The posterior pelvis was prepped with chlorhexidinein a sterile
fashion, and a sterile drape was applied covering the operative
field. A sterile gown and sterile gloves were used for the
procedure. Local anesthesia was provided with 1% Lidocaine.

We targeted the left posterior iliac bone for biopsy. The skin and
subcutaneous tissues were infiltrated with 1% lidocaine without
epinephrine. A small stab incision was made with an 11 blade
scalpel, and an 11 gauge Blain needle was advanced with CT guidance
to the posterior cortex. Manual forced was used to advance the
needle through the posterior cortex and the stylet was removed. A
bone marrow aspirate was retrieved and passed to a cytotechnologist
in the room. The Blain needle was then advanced without the stylet
for a core biopsy. The core biopsy was retrieved and also passed to
a cytotechnologist.

Manual pressure was used for hemostasis and a sterile dressing was
placed.

No complications were encountered no significant blood loss was
encountered.

Patient tolerated the procedure well and remained hemodynamically
stable throughout.
IMPRESSION: Status post CT-guided bone marrow biopsy, with tissue specimen sent
to pathology for complete histopathologic analysis

## 2019-05-31 DIAGNOSIS — 419620001 Death: Secondary | SNOMED CT | POA: Diagnosis not present

## 2019-05-31 NOTE — Progress Notes (Signed)
Dr. Benay Spice notified by EMS today that patient was found in home today deceased. Appears she may have been deceased for couple days.

## 2019-05-31 DEATH — deceased

## 2019-06-14 ENCOUNTER — Ambulatory Visit: Payer: PPO

## 2019-06-14 ENCOUNTER — Other Ambulatory Visit: Payer: PPO

## 2019-06-14 ENCOUNTER — Ambulatory Visit: Payer: PPO | Admitting: Oncology

## 2020-04-18 IMAGING — CT CT BIOPSY
1 of 2 series · 15 of 22 positions shown, 19 images · non-contrast
Comparison: none

INDICATION: 67-year-old with multiple myeloma. History of previous bone marrow
biopsy on 09/22/2017.

[Series 2: i-spiral 5.0 b40f · axial · 0.98mm/px · z∈[-90,-34]mm · 15 of 19 slices shown, 19 images]
[im 2/19  mediastinal]
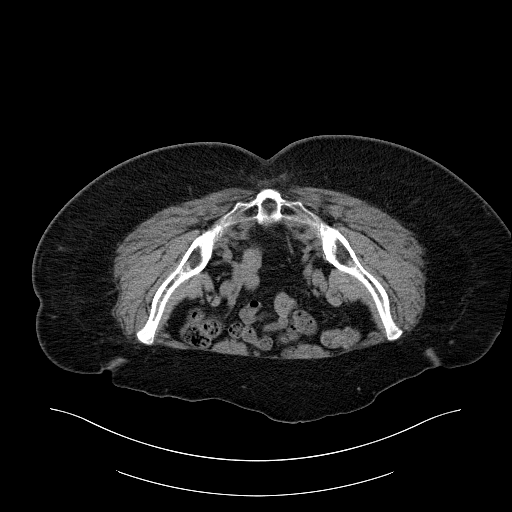
[im 2/19  lung]
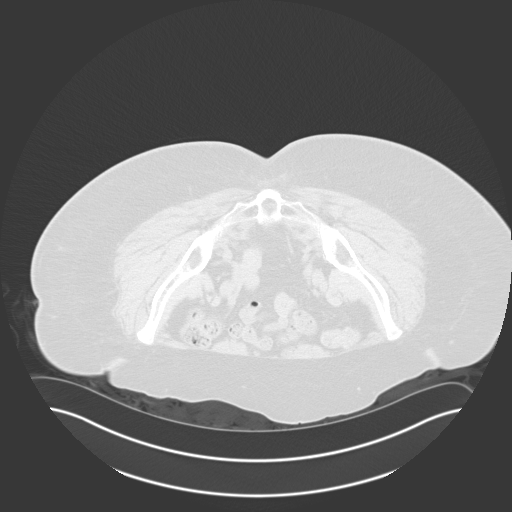
[im 3/19  lung]
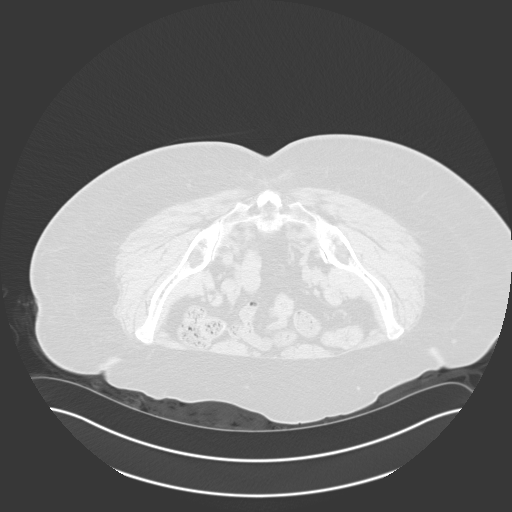
[im 4/19  lung]
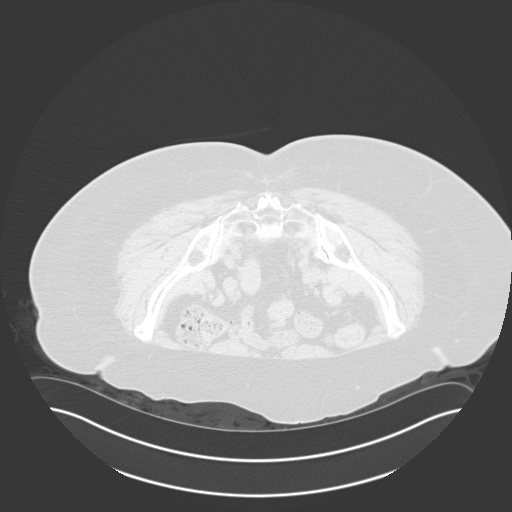
[im 5/19  lung]
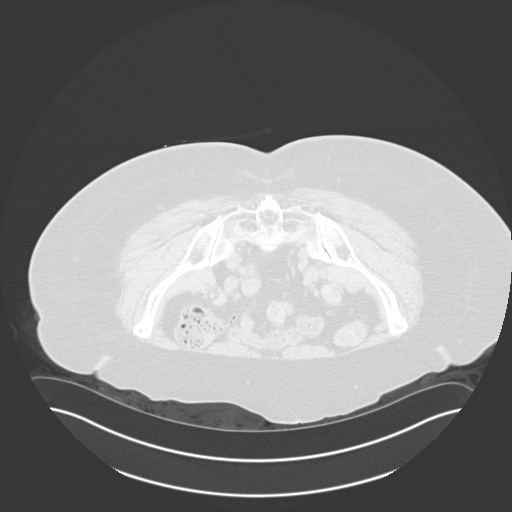
[im 7/19  mediastinal]
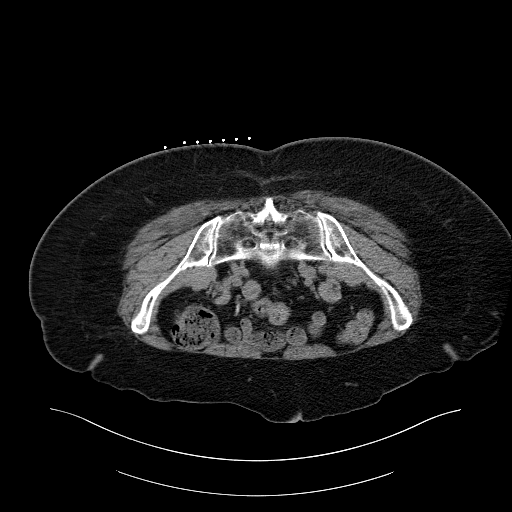
[im 7/19  lung]
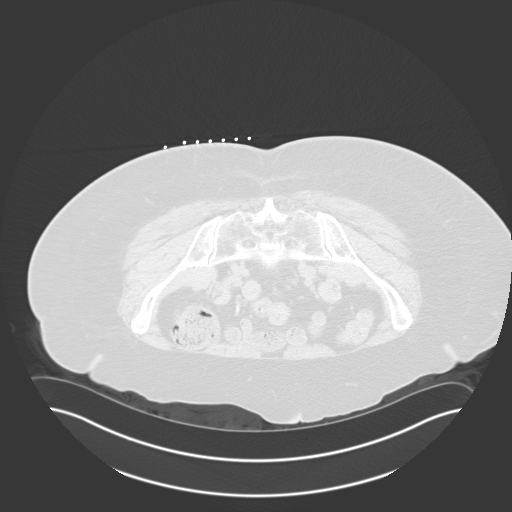
[im 8/19  lung]
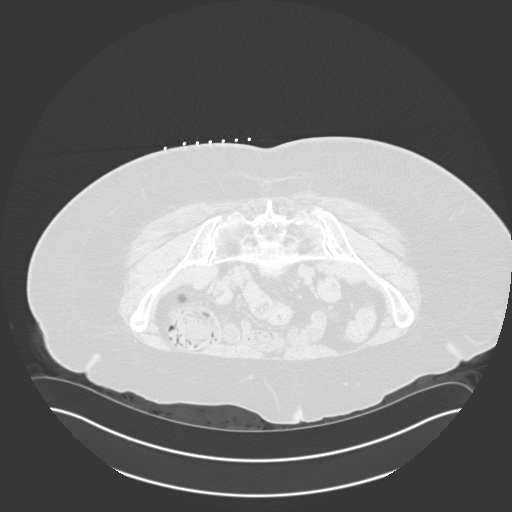
[im 9/19  lung]
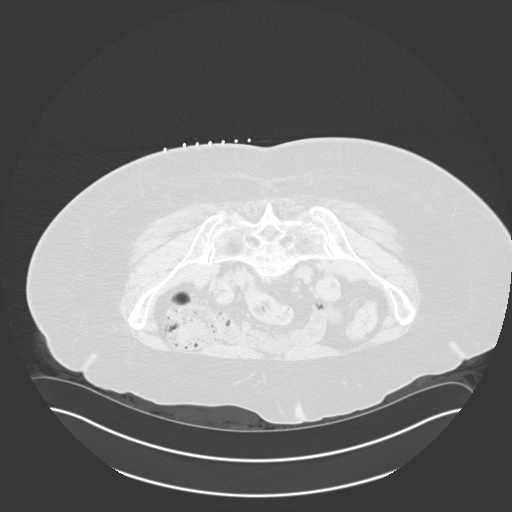
[im 10/19  lung]
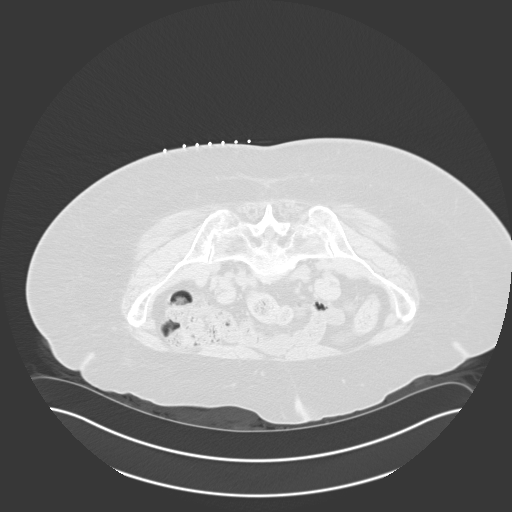
[im 11/19  mediastinal]
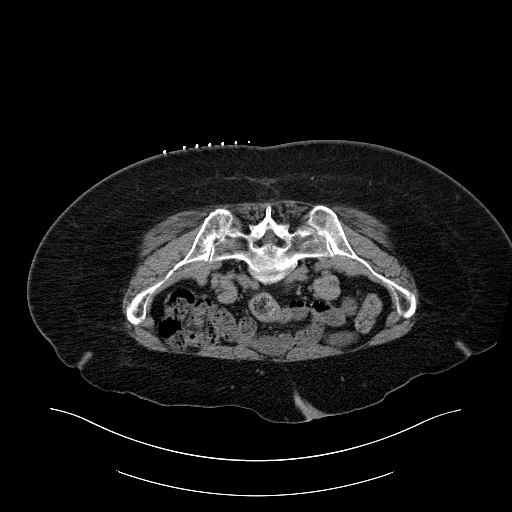
[im 11/19  lung]
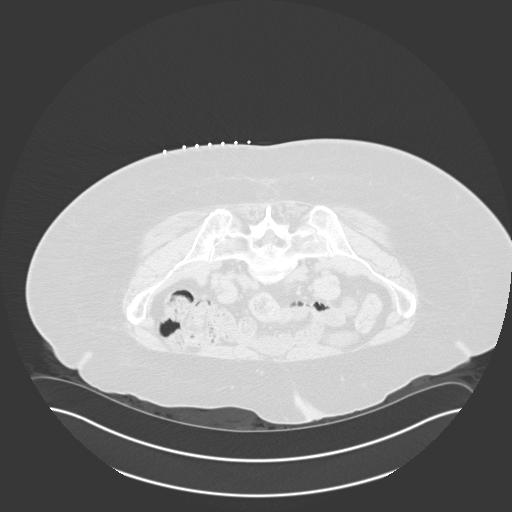
[im 12/19  lung]
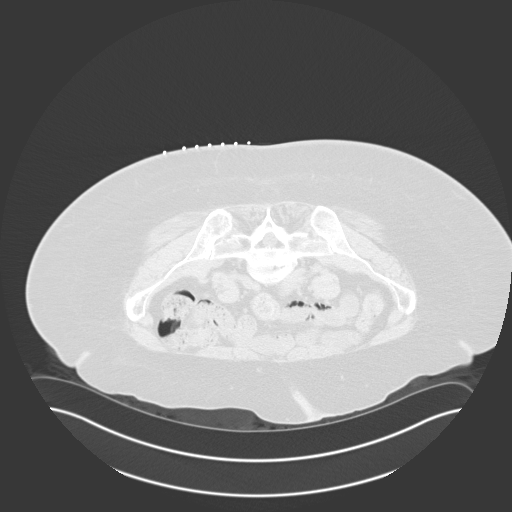
[im 13/19  lung]
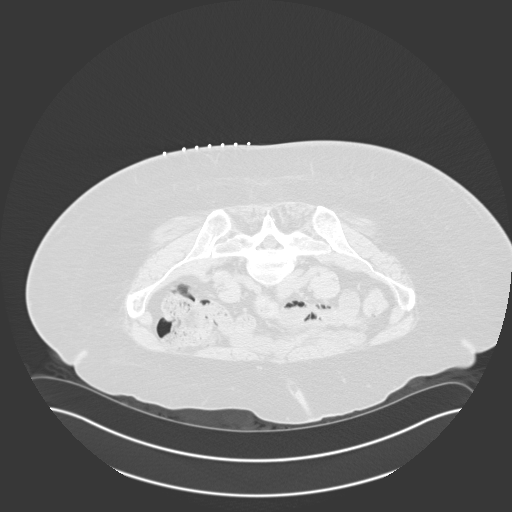
[im 15/19  lung]
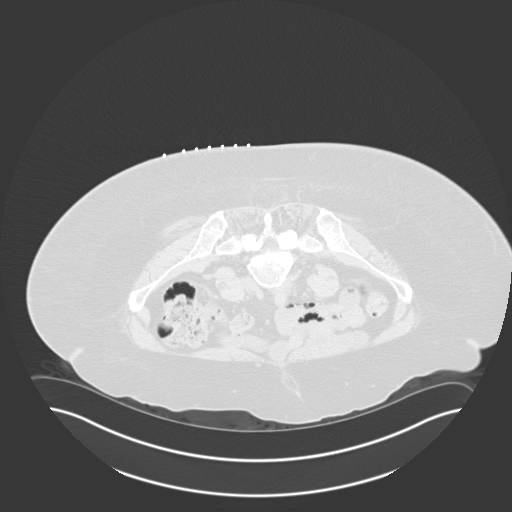
[im 16/19  mediastinal]
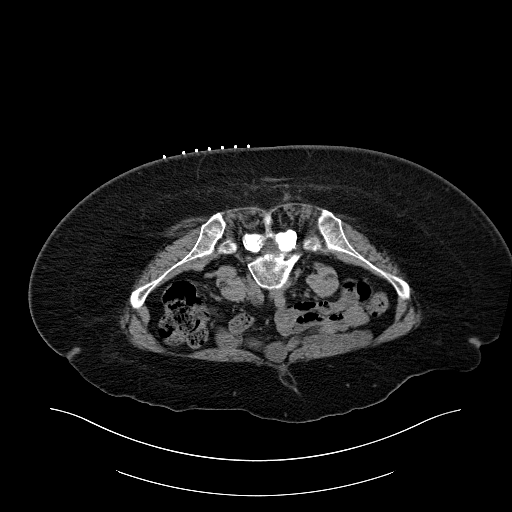
[im 16/19  lung]
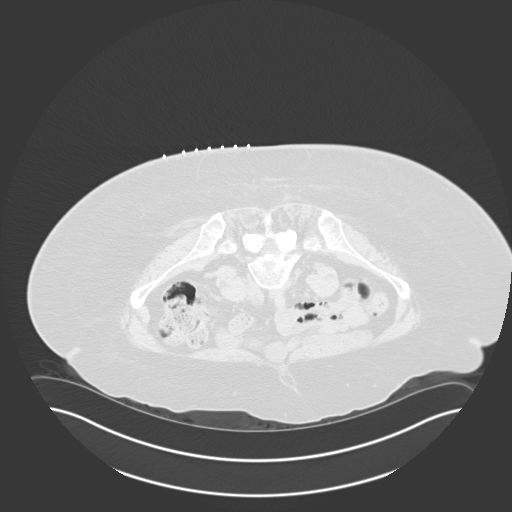
[im 17/19  lung]
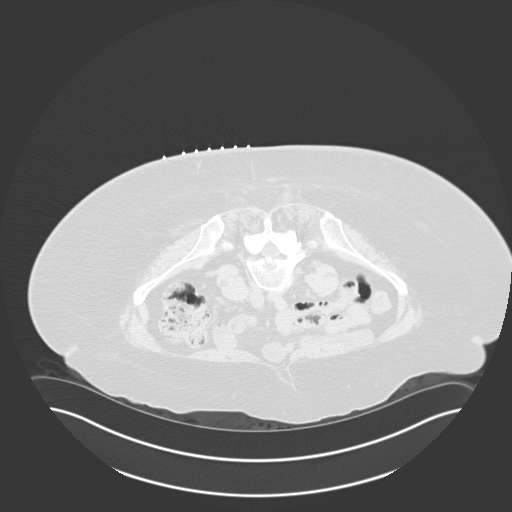
[im 18/19  lung]
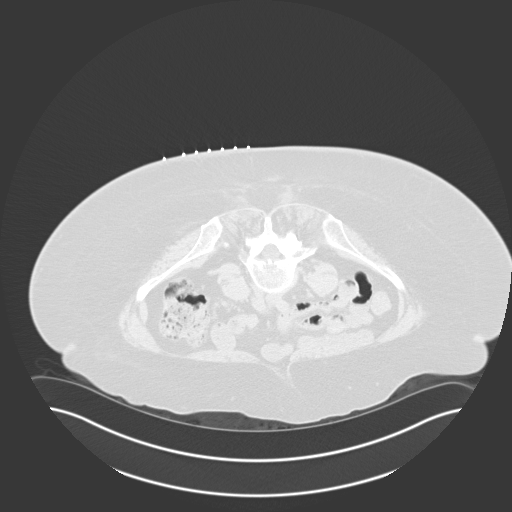

[15 of 22 positions shown; findings below may reference images not displayed]

EXAM:
CT GUIDED BONE MARROW ASPIRATES AND BIOPSY

MEDICATIONS:
None.

ANESTHESIA/SEDATION:
Fentanyl 100 mcg IV; Versed 2.0 mg IV

Moderate Sedation Time:  10 minutes

The patient was continuously monitored during the procedure by the
interventional radiology nurse under my direct supervision.

COMPLICATIONS:
None immediate.

PROCEDURE:
The procedure was explained to the patient. The risks and benefits
of the procedure were discussed and the patient's questions were
addressed. Informed consent was obtained from the patient. The
patient was placed prone on CT table. Images of the pelvis were
obtained. The right side of back was prepped and draped in sterile
fashion. The skin and right posterior ilium were anesthetized with
1% lidocaine. 11 gauge bone needle was directed into the right ilium
with CT guidance. Two aspirates and one core biopsy were obtained.
Bandage placed over the puncture site.
IMPRESSION: CT guided bone marrow aspiration and core biopsy.
